# Patient Record
Sex: Male | Born: 1950 | Race: White | Hispanic: No | Marital: Married | Smoking: Never smoker
Health system: Southern US, Community
[De-identification: ages and names within clinical notes are randomized; demographics above are authoritative.]

## PROBLEM LIST (undated history)

## (undated) DIAGNOSIS — C61 Malignant neoplasm of prostate: Secondary | ICD-10-CM

## (undated) DIAGNOSIS — I350 Nonrheumatic aortic (valve) stenosis: Secondary | ICD-10-CM

## (undated) DIAGNOSIS — J189 Pneumonia, unspecified organism: Secondary | ICD-10-CM

## (undated) DIAGNOSIS — J111 Influenza due to unidentified influenza virus with other respiratory manifestations: Secondary | ICD-10-CM

## (undated) DIAGNOSIS — C4490 Unspecified malignant neoplasm of skin, unspecified: Secondary | ICD-10-CM

## (undated) DIAGNOSIS — F419 Anxiety disorder, unspecified: Secondary | ICD-10-CM

## (undated) DIAGNOSIS — J8410 Pulmonary fibrosis, unspecified: Secondary | ICD-10-CM

## (undated) HISTORY — PX: NASAL SEPTUM SURGERY: SHX37

## (undated) HISTORY — PX: HERNIA REPAIR: SHX51

## (undated) HISTORY — DX: Anxiety disorder, unspecified: F41.9

## (undated) HISTORY — PX: SKIN CANCER EXCISION: SHX779

---

## 2016-02-17 ENCOUNTER — Emergency Department (HOSPITAL_BASED_OUTPATIENT_CLINIC_OR_DEPARTMENT_OTHER)
Admission: EM | Admit: 2016-02-17 | Discharge: 2016-02-17 | Disposition: A | Discharge status: Home / Self Care | Payer: BLUE CROSS/BLUE SHIELD | Attending: Emergency Medicine | Admitting: Emergency Medicine

## 2016-02-17 ENCOUNTER — Encounter (HOSPITAL_BASED_OUTPATIENT_CLINIC_OR_DEPARTMENT_OTHER): Payer: Self-pay

## 2016-02-17 ENCOUNTER — Emergency Department (HOSPITAL_BASED_OUTPATIENT_CLINIC_OR_DEPARTMENT_OTHER): Payer: BLUE CROSS/BLUE SHIELD

## 2016-02-17 DIAGNOSIS — Z85828 Personal history of other malignant neoplasm of skin: Secondary | ICD-10-CM | POA: Insufficient documentation

## 2016-02-17 DIAGNOSIS — J189 Pneumonia, unspecified organism: Secondary | ICD-10-CM | POA: Diagnosis not present

## 2016-02-17 DIAGNOSIS — R05 Cough: Secondary | ICD-10-CM | POA: Diagnosis present

## 2016-02-17 HISTORY — DX: Influenza due to unidentified influenza virus with other respiratory manifestations: J11.1

## 2016-02-17 HISTORY — DX: Pneumonia, unspecified organism: J18.9

## 2016-02-17 HISTORY — DX: Unspecified malignant neoplasm of skin, unspecified: C44.90

## 2016-02-17 LAB — BASIC METABOLIC PANEL
Anion gap: 7 (ref 5–15)
BUN: 16 mg/dL (ref 6–20)
CO2: 25 mmol/L (ref 22–32)
Calcium: 8.4 mg/dL — ABNORMAL LOW (ref 8.9–10.3)
Chloride: 103 mmol/L (ref 101–111)
Creatinine, Ser: 1.05 mg/dL (ref 0.61–1.24)
GFR calc Af Amer: >60 mL/min (ref >60)
GFR calc non Af Amer: >60 mL/min (ref >60)
Glucose, Bld: 132 mg/dL — ABNORMAL HIGH (ref 65–99)
Potassium: 4.1 mmol/L (ref 3.5–5.1)
Sodium: 135 mmol/L (ref 135–145)

## 2016-02-17 LAB — URINALYSIS, ROUTINE W REFLEX MICROSCOPIC
Bilirubin Urine: NEGATIVE
Glucose, UA: NEGATIVE mg/dL
Hgb urine dipstick: NEGATIVE
Ketones, ur: NEGATIVE mg/dL
Leukocytes, UA: NEGATIVE
Nitrite: NEGATIVE
Protein, ur: 30 mg/dL — AB
Specific Gravity, Urine: 1.015 (ref 1.005–1.030)
pH: 6 (ref 5.0–8.0)

## 2016-02-17 LAB — CBC WITH DIFFERENTIAL/PLATELET
Basophils Absolute: 0 10*3/uL (ref 0.0–0.1)
Basophils Relative: 0 %
Eosinophils Absolute: 0.4 10*3/uL (ref 0.0–0.7)
Eosinophils Relative: 3 %
HCT: 38.6 % — ABNORMAL LOW (ref 39.0–52.0)
Hemoglobin: 13.1 g/dL (ref 13.0–17.0)
Lymphocytes Relative: 7 %
Lymphs Abs: 1 10*3/uL (ref 0.7–4.0)
MCH: 30.8 pg (ref 26.0–34.0)
MCHC: 33.9 g/dL (ref 30.0–36.0)
MCV: 90.8 fL (ref 78.0–100.0)
Monocytes Absolute: 1.6 10*3/uL — ABNORMAL HIGH (ref 0.1–1.0)
Monocytes Relative: 11 %
Neutro Abs: 11.2 10*3/uL — ABNORMAL HIGH (ref 1.7–7.7)
Neutrophils Relative %: 79 %
Platelets: 215 10*3/uL (ref 150–400)
RBC: 4.25 MIL/uL (ref 4.22–5.81)
RDW: 14.4 % (ref 11.5–15.5)
WBC: 14.2 10*3/uL — ABNORMAL HIGH (ref 4.0–10.5)

## 2016-02-17 LAB — URINALYSIS, MICROSCOPIC (REFLEX): WBC, UA: NONE SEEN WBC/hpf (ref 0–5)

## 2016-02-17 LAB — I-STAT CG4 LACTIC ACID, ED: Lactic Acid, Venous: 0.88 mmol/L (ref 0.5–1.9)

## 2016-02-17 MED ORDER — ACETAMINOPHEN 325 MG PO TABS
650.0000 mg | ORAL_TABLET | Freq: Once | ORAL | Status: AC
  Administered 2016-02-17: 650 mg via ORAL
  Filled 2016-02-17: qty 2

## 2016-02-17 MED ORDER — SODIUM CHLORIDE 0.9 % IV BOLUS (SEPSIS)
1000.0000 mL | Freq: Once | INTRAVENOUS | Status: AC
  Administered 2016-02-17: 1000 mL via INTRAVENOUS

## 2016-02-17 MED ORDER — AZITHROMYCIN 250 MG PO TABS: 250.0000 mg | ORAL_TABLET | Freq: Every day | ORAL | 0 refills | Status: DC

## 2016-02-17 MED ORDER — IBUPROFEN 800 MG PO TABS
800.0000 mg | ORAL_TABLET | Freq: Once | ORAL | Status: AC
  Administered 2016-02-17: 800 mg via ORAL

## 2016-02-17 MED ORDER — AMOXICILLIN 500 MG PO CAPS
500.0000 mg | ORAL_CAPSULE | Freq: Once | ORAL | Status: AC
  Administered 2016-02-17: 500 mg via ORAL
  Filled 2016-02-17: qty 1

## 2016-02-17 MED ORDER — AMOXICILLIN 500 MG PO CAPS: 500.0000 mg | ORAL_CAPSULE | Freq: Three times a day (TID) | ORAL | 0 refills | Status: DC

## 2016-02-17 MED ORDER — SODIUM CHLORIDE 0.9 % IV BOLUS (SEPSIS)
500.0000 mL | Freq: Once | INTRAVENOUS | Status: AC
  Administered 2016-02-17: 500 mL via INTRAVENOUS

## 2016-02-17 MED ORDER — BENZONATATE 100 MG PO CAPS: 100.0000 mg | ORAL_CAPSULE | Freq: Three times a day (TID) | ORAL | 0 refills | Status: DC

## 2016-02-17 MED ORDER — AZITHROMYCIN 250 MG PO TABS
500.0000 mg | ORAL_TABLET | Freq: Once | ORAL | Status: AC
  Administered 2016-02-17: 500 mg via ORAL
  Filled 2016-02-17: qty 2

## 2016-02-17 MED ORDER — IBUPROFEN 800 MG PO TABS
ORAL_TABLET | ORAL | Status: AC
  Filled 2016-02-17: qty 1

## 2016-02-17 NOTE — ED Notes (Signed)
Pt unable to give UA at this time 

## 2016-02-17 NOTE — ED Provider Notes (Signed)
[facility] Provider Note   CSN: [id number] Arrival date & time: 02/17/16  [id number]   By signing my name below, I, Dustin Dean, attest that this documentation has been prepared under the direction and in the presence of Eliezer Mccoy, PA-C Electronically Signed: Soijett Dean, ED Scribe. 02/17/16. 7:06 PM.  History   Chief Complaint Chief Complaint  Patient presents with  . Cough    HPI Eliyas Dean is a 66 y.o. male with a PMHx of flu, pneumonia, who presents to the Emergency Department complaining of intermittent productive cough x reddish-brown sputum onset 5 days ago. Pt reports associated fever of 102.7 and SOB. Pt has tried cetirizine and tylenol with mild relief of his symptoms. Pt notes that he has had pneumonia in the past, but denies being admitted in the past. He denies CP, nausea, vomiting, abdominal pain, urinary symptoms, ear pain, appetite change, and any other symptoms. Denies hx of COPD, HTN, or DM.    The history is provided by the patient. No language interpreter was used.    Past Medical History:  Diagnosis Date  . Flu   . Pneumonia   . Skin cancer     There are no active problems to display for this patient.   Past Surgical History:  Procedure Laterality Date  . HERNIA REPAIR    . SKIN CANCER EXCISION         Home Medications    Prior to Admission medications   Medication Sig Start Date End Date Taking? Authorizing Provider  Cetirizine HCl (ZYRTEC PO) Take by mouth.   Yes Historical Provider, MD  fluticasone (FLONASE) 50 MCG/ACT nasal spray Place into both nostrils daily.   Yes Historical Provider, MD  PAROXETINE HCL PO Take by mouth.   Yes Historical Provider, MD  amoxicillin (AMOXIL) 500 MG capsule Take 1 capsule (500 mg total) by mouth 3 (three) times daily. 02/17/16   Frederica Kuster, PA-C  azithromycin (ZITHROMAX) 250 MG tablet Take 1 tablet (250 mg total) by mouth daily. 02/17/16   Frederica Kuster, PA-C  benzonatate (TESSALON) 100 MG  capsule Take 1 capsule (100 mg total) by mouth every 8 (eight) hours. 02/17/16   Frederica Kuster, PA-C    Family History No family history on file.  Social History Social History  Substance Use Topics  . Smoking status: Never Smoker  . Smokeless tobacco: Never Used  . Alcohol use Yes     Comment: occ     Allergies   Patient has no known allergies.   Review of Systems Review of Systems  Constitutional: Negative for appetite change, chills and fever.  HENT: Negative for ear pain, facial swelling and sore throat.   Respiratory: Positive for cough and shortness of breath.   Cardiovascular: Negative for chest pain.  Gastrointestinal: Negative for abdominal pain, nausea and vomiting.  Genitourinary: Negative for difficulty urinating and dysuria.  Musculoskeletal: Negative for back pain.  Skin: Negative for rash and wound.  Neurological: Negative for headaches.  Psychiatric/Behavioral: The patient is not nervous/anxious.      Physical Exam Updated Vital Signs BP 154/89   Pulse 101   Temp 99.2 F (37.3 C) (Oral)   Resp (!) 29   Ht 5\' 8"  (1.727 m)   Wt 93.9 kg   SpO2 95%   BMI 31.47 kg/m   Physical Exam  Constitutional: He appears well-developed and well-nourished. No distress.  HENT:  Head: Normocephalic and atraumatic.  Mouth/Throat: Oropharynx is clear and moist. No oropharyngeal  exudate.  Eyes: Conjunctivae are normal. Pupils are equal, round, and reactive to light. Right eye exhibits no discharge. Left eye exhibits no discharge. No scleral icterus.  Neck: Normal range of motion. Neck supple. No thyromegaly present.  Cardiovascular: Normal rate, regular rhythm, normal heart sounds and intact distal pulses.  Exam reveals no gallop and no friction rub.   No murmur heard. Pulmonary/Chest: Effort normal. No stridor. No respiratory distress. He has no wheezes. He has rales.  Bilateral rales throughout  Abdominal: Soft. Bowel sounds are normal. He exhibits no distension.  There is no tenderness. There is no rebound and no guarding.  Musculoskeletal: He exhibits no edema.  Lymphadenopathy:    He has no cervical adenopathy.  Neurological: He is alert. Coordination normal.  Skin: Skin is warm and dry. No rash noted. He is not diaphoretic. No pallor.  Psychiatric: He has a normal mood and affect.  Nursing note and vitals reviewed.    ED Treatments / Results  DIAGNOSTIC STUDIES: Oxygen Saturation is 96% on RA, nl by my interpretation.    COORDINATION OF CARE: 7:05 PM Discussed treatment plan with pt at bedside which includes CXR, labs, and pt agreed to plan.   Labs (all labs ordered are listed, but only abnormal results are displayed) Labs Reviewed  CBC WITH DIFFERENTIAL/PLATELET - Abnormal; Notable for the following:       Result Value   WBC 14.2 (*)    HCT 38.6 (*)    Neutro Abs 11.2 (*)    Monocytes Absolute 1.6 (*)    All other components within normal limits  BASIC METABOLIC PANEL - Abnormal; Notable for the following:    Glucose, Bld 132 (*)    Calcium 8.4 (*)    All other components within normal limits  URINALYSIS, ROUTINE W REFLEX MICROSCOPIC - Abnormal; Notable for the following:    Protein, ur 30 (*)    All other components within normal limits  URINALYSIS, MICROSCOPIC (REFLEX) - Abnormal; Notable for the following:    Bacteria, UA RARE (*)    Squamous Epithelial / LPF 0-5 (*)    All other components within normal limits  I-STAT CG4 LACTIC ACID, ED    Radiology Dg Chest 2 View  Result Date: 02/17/2016 CLINICAL DATA:  Acute onset of productive cough, generalized chest pain and fever. Initial encounter. EXAM: CHEST  2 VIEW COMPARISON:  None. FINDINGS: The lungs are well-aerated. Patchy bilateral airspace opacities raise concern for pneumonia. Underlying peribronchial thickening is noted. There is no evidence of pleural effusion or pneumothorax. The heart is borderline normal in size. No acute osseous abnormalities are seen. IMPRESSION:  Patchy bilateral airspace opacities raise concern for pneumonia. Underlying peribronchial thickening noted. Would perform follow-up chest radiograph 3-4 weeks after completion of treatment for pneumonia, to exclude underlying interstitial lung disease, given the appearance of airspace opacities. Electronically Signed   By: Garald Balding M.D.   On: 02/17/2016 18:55    Procedures Procedures (including critical care time)  Medications Ordered in ED Medications  acetaminophen (TYLENOL) tablet 650 mg (650 mg Oral Given 02/17/16 1844)  sodium chloride 0.9 % bolus 1,000 mL (0 mLs Intravenous Stopped 02/17/16 2040)  ibuprofen (ADVIL,MOTRIN) tablet 800 mg (800 mg Oral Given 02/17/16 1916)  azithromycin (ZITHROMAX) tablet 500 mg (500 mg Oral Given 02/17/16 2039)  amoxicillin (AMOXIL) capsule 500 mg (500 mg Oral Given 02/17/16 2040)  sodium chloride 0.9 % bolus 500 mL (0 mLs Intravenous Stopped 02/17/16 2131)     Initial Impression /  Assessment and Plan / ED Course  I have reviewed the triage vital signs and the nursing notes.  Pertinent labs & imaging results that were available during my care of the patient were reviewed by me and considered in my medical decision making (see chart for details).     Patient with. CBC shows the CBC 14.2. BMP shows glucose 132, calcium 8.4. Lactate 0.88. UA shows 30 protein, rare bacteria. Patient initially tachycardic which was improved following the reduction with Motrin and Tylenol and 1500 mL fluid bolus. Patient given first dose of azithromycin and amoxicillin in the ED. CURB-65 negative patient is well-appearing. Discharge home with amoxicillin, and azithromycin, Tessalon. Recheck a PCP in 3 days. Strict return precautions discussed. Patient discharged in satisfactory condition. I discussed patient case with Dr. Tamera Punt who guided the patient's management and agrees with plan.  Final Clinical Impressions(s) / ED Diagnoses   Final diagnoses:  Community acquired pneumonia,  unspecified laterality    New Prescriptions Discharge Medication List as of 02/17/2016  9:35 PM    START taking these medications   Details  benzonatate (TESSALON) 100 MG capsule Take 1 capsule (100 mg total) by mouth every 8 (eight) hours., Starting Fri 02/17/2016, Print       I personally performed the services described in this documentation, which was scribed in my presence. The recorded information has been reviewed and is accurate.     Frederica Kuster, PA-C 02/17/16 [place], MD 02/17/16 2212

## 2016-02-17 NOTE — Discharge Instructions (Signed)
Medications: Azithromycin, amoxicillin, Tessalon  Treatment: Take azithromycin and amoxicillin as prescribed. Make sure to finish all this medication. Take Tessalon every 8 hours as needed for cough. You can also take the medicine you have at home instead.  Follow-up: Please follow-up with your primary care provider on Monday for recheck and follow-up of today's visit. Please return to emergency department if he develop any new or worsening symptoms including shortness of breath, lightheadedness, fever over 100.4 that is not resolved with Tylenol or Motrin, or any other new or concerning symptoms.

## 2016-02-17 NOTE — ED Triage Notes (Signed)
C/o flu s/s x 5 days-NAD-steady gait

## 2016-02-17 NOTE — ED Notes (Signed)
Patient transported to X-ray 

## 2021-01-24 ENCOUNTER — Other Ambulatory Visit: Payer: Self-pay

## 2021-01-24 ENCOUNTER — Ambulatory Visit (INDEPENDENT_AMBULATORY_CARE_PROVIDER_SITE_OTHER): Payer: Medicare Other | Admitting: Internal Medicine

## 2021-01-24 ENCOUNTER — Encounter: Payer: Self-pay | Admitting: Internal Medicine

## 2021-01-24 VITALS — BP 128/80 | HR 84 | Temp 98.4°F | Ht 67.0 in | Wt 216.2 lb

## 2021-01-24 DIAGNOSIS — R053 Chronic cough: Secondary | ICD-10-CM | POA: Diagnosis not present

## 2021-01-24 DIAGNOSIS — J9611 Chronic respiratory failure with hypoxia: Secondary | ICD-10-CM

## 2021-01-24 DIAGNOSIS — J849 Interstitial pulmonary disease, unspecified: Secondary | ICD-10-CM | POA: Diagnosis not present

## 2021-01-24 DIAGNOSIS — R911 Solitary pulmonary nodule: Secondary | ICD-10-CM

## 2021-01-24 DIAGNOSIS — I350 Nonrheumatic aortic (valve) stenosis: Secondary | ICD-10-CM

## 2021-01-24 DIAGNOSIS — E669 Obesity, unspecified: Secondary | ICD-10-CM

## 2021-01-24 NOTE — Patient Instructions (Addendum)
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 - Based on report and history it is progressive -Specific variety not known but differential diagnosis is idiopathic pulmonary fibrosis [IPF] versus chronic hypersensitive pneumonitis versus other patterns  Plan -Glad you are already in contact with Marlane Mingle and the support group - Serum: ESR, ACE, ANA, DS-DNA, RF, anti-CCP,   Total CK,  Aldolase,   scl-70, ssA, ssB, anti-RNP, anti-JO-1, & Hypersensitivity Pneumonitis Panel -Check QuantiFERON gold -Do full pulmonary function test -Do high-resolution CT chest supine and prone [3rd-4th week January 2023 -based on history of right lower lobe nodule] -We will discuss available CT scan data from the outside hospital with our radiologist -> potentially commit to antifibrotic therapy (pirfenidone versus nintedanib] even before the next visit - get rid of feather pillow - will advise to stop gardening once HP suggested onCT  Right lower lobe nodule 9 mm -seen on CT October 2022  Plan  - capture information on above CT chest  Chronic cough -due to  interstitial lung disease   Plan - To be addressed in the future including consideration for cough research protocol -For now gabapentin  Nonrheumatic aortic valve stenosis   Plan  - We will follow-up on the echo that you are doing with your cardiologist tomorrow  Obesity - BMI 33.8  Plan  - Ultimately significant weight loss is required  Followup   - Next few to several weeks but after completing all the tests to discuss commencing antifibrotic therapy and discussing test results -At 30-minute visit Dr. Chase Caller  Addendum 01/26/2021 -radiology Dr. Weber Cooks wrote back saying CT scan is classic for chronic HP.  We will call the patient and get the patient started on nintedanib counseling which is first-line for progressive non-- IPF  ILD.  If he is reluctant then we will do pirfenidone.  We will set up pharmacy counseling.  If he is reluctant for nintedanib based on risk versus benefit we will do pirfenidone

## 2021-01-24 NOTE — Progress Notes (Addendum)
Cardiology Dr. Loni Beckwith 02/15/20 Dustin Dean is 71 y.o. year old male with a history of No prior cardiovascular disease who presents to [place] cardiology for the evaluation of a heart murmur. This patient is a non-smoker and denies a previous history of myocardial infarction, congestive heart failure, or valvular heart disease. 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 He really has little in the way of exertional chest symptoms. Occasionally according to the wife if he walks up an incline briskly he may have some minor shortness of breath but remains active and has little in the way limitation. He denies a previous history of myocardial infarction, congestive heart failure, or known valvular heart disease. At a recent examination he was found to have a heart murmur and an echocardiogram was performed. It demonstrated a peak gradient across his aortic valve of 30 mmHg and an aortic valve area of 1.1 cm. This was read as being consistent with moderate aortic stenosis. I had a fairly significant discussion with the patient and his wife about the implications of his aortic valve and the need to follow the aortic stenosis progression over time.  11/14/2020 office visit with Dustin Dean pulmonary physician assistant at [facility] has past medical history of restrictive lung disease, interstitial lung disease, allergic rhinitis, laryngeal pharyngeal reflux, arthritis, lung nodules and persistent cough. He is a non-smoker and has never smoked. He has medication allergy to pseudoephedrine.  [name] presents today with complaint of a progressively worsening shortness of breath and dyspnea on exertion over the past several months. We have been following his interstitial lung disease since 2018. He has been stable without complaint of dyspnea on exertion over the past several years. He indicates that recently he has had more shortness of breath  with exertion. He and his wife will walk the dog in their usual route through the neighborhood that they have done for many years. He reports that he will get short of breath along the root in a way that he has never been bothered with previously. He has recently requested an albuterol inhaler and reports that it has been helpful. He has been using Symbicort 160/4.5 twice daily but has been having difficulty using it successfully as he has been coughing significantly and loses a lot of the medication. A recent chest CT conducted 10/24/2020 notes a progression in the interstitial changes. A 9 mm right lower lobe nodule is also noted. The plan is to have a 61-monthfollow-up chest CT conducted 02/04/2021. A 6-minute walk test conducted today shows oxygen desaturation and need for supplemental oxygen. I will place the order for supplemental oxygen. [facility]   Patient at rest on RA with baseline sats : 97% After exertion for 3 min on RA pt's sats were : 86% Patient placed on oxygen via nasal cannula at 2 lpm flow via portable oxygen concentrator device Exertion for 6 mins with oxygen confirmed final sats at 94%   #1 lung nodules Chest CT 10/24/2020 shows 9 mm right lower lobe lung nodule Follow-up chest CT scheduled for 324-montheevaluation in January 2023  2. Interstitial lung disease Chest CT 11/04/2020 notes fibrotic interstitial lung disease with evidence of progression compared to 08/28/2020 [facility]   #Primary Care dec 2022 Osteopenia  Due for DXA. Prostate cancer Saw Dr. Louis Meckel at [facility]. Biopsy + for cancer. They are just watching it. He is going to have MRI and another biopsy next visit.   OV 01/24/2021 -evaluation and transfer of care to Dr. Chase Caller in ILD center in [place].  Referred by Marlane Mingle  patient support group leader for the pulmonary fibrosis foundation support group  Subjective:  Patient ID: Dustin Dean, male , DOB: [birth year] , age 84 y.o. , MRN: [id number] , ADDRESS: [place] [id number] PCP Joneen Boers, MD Patient Care Team: Joneen Boers, MD as PCP - General (Family Medicine)  This Provider for this visit: Treatment Team:  Attending Provider: Brand Males, MD    01/24/2021 -   Chief Complaint  Patient presents with   Consult    Pt is here for a switch of providers to take over care for his pulmonary fibrosis. 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 Pt does become SOB with activities and also states that he does have a chronic cough.     HPI  Dustin Dean 71 y.o. -new evaluation for interstitial lung disease.  History is provided by review of the chart and also talking to the patient and his wife.  He tells me that he has had chronic cough since his teenage years.  But for the last few to several years its been more consistent.  He was seeing the physician assistant at [facility].  Somewhere along the way he got referred to Dr. Dennison Mascot right at [facility] some 3 years ago.  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  After that cough is largely improved except for occasional exacerbations in the fall in the winter.  Then in 2018 sometime after he went on gabapentin he was informed by the physician assistant that he might have early ILD.  He does not recollect any serology or biopsy being done.  He does recollect a few pulmonary function test.  Then all of a sudden starting September 2022 his cough "got out of hand" and also developed worsening shortness of breath.  This the first time he started developing worsening shortness of breath.  Then by November 2022 started needing exertional oxygen leading to current symptoms.  He was referred to [facility] but he preferred to establish with the ILD center  here in [place] with [place] based on [facility] recommendation.  He found Marlane Mingle through Internet search for pulmonary fibrosis foundation.  His current symptom score is below.  He is not on any antifibrotic.  [place] [facility] Comprehensive ILD Questionnaire  Symptoms:  SYMPTOM SCALE - ILD 01/24/2021  Current weight   O2 use 2L [place] with ex  Shortness of Breath 0 -> 5 scale with 5 being worst (score 6 If unable to do)  At rest 0  Simple tasks - showers, clothes change, eating, shaving 1  Household (dishes, doing bed, laundry) 1  Shopping 2  Walking level at own pace 5  Walking up Stairs 5  Total (30-36) Dyspnea Score 14  How bad is your cough? 2  How bad is your fatigue 1  How bad is nausea 00000  How bad is vomiting?  0  How bad is diarrhea? 0  How bad is anxiety? 0  How bad is depression 0  Any chronic pain - if so where and how bad 0       Past Medical History :  -He  has obesity BMI 33-34 - Denies any collagen vascular disease - He is known to have aortic stenosis -sees cardiologist at [facility].  December 2021 echocardiogram EF 55 to 60% with tricuspid aortic valve and moderate stenosis normal right ventricle -Denies any asthma or COPD.  Denies any heart failure denies any collagen vascular disease -Denies snoring or excessive daytime somnolence.  Denies stroke.  Denies formal diagnosis of sleep apnea denies formal diagnosis of pulmonary hypertension [echo a year ago did not show pulmonary hypertension] -Has had vaccination against COVID but has not had COVID disease -Recent diagnosis of late 2022 early stage prostate cancer at [facility] on observation treatment -Has history of skin cancer for which he apply sunscreen regularly  ROS:  -Shortness of breath present Cough chronic plus -Has intermittent chronic diarrhea and takes Imodium but no other GI side effects -Possible Raynaud's for the last several decades  FAMILY HISTORY of LUNG DISEASE:  Denies  family for pulmonary fibrosis.  Denies family Struve COPD or asthma sarcoid or cystic fibrosis.  Denies family history of hypersensitive pneumonitis.  Denies autoimmune disease.  Denies any premature graying of the hair or albinism  PERSONAL EXPOSURE HISTORY:  No prior cigarette smoking -No marijuana use of cocaine use no intravenous drug use.  HOME  EXPOSURE and HOBBY DETAILS :  -Single-family home in a one-story space.  [place] setting age of the current home is 31 years.  He is lived there for 6 years.  He does use a feather pillow and this occasionally mold or mildew in the bathroom.  All his life he is done active gardening with mulch and woodchips and occasionally damp soil.  He is worked out in the yard quite a bit and does gardening.  He enjoys the gardening.  OCCUPATIONAL HISTORY (122 questions) : -He worked in the [employer] at [employer] and then [employer] no [employer].  He does not remember if the buildings had mold in it.  He does not think so -Detail greater than 100 question organic and inorganic antigen history exposure is negative  PULMONARY TOXICITY HISTORY (27 items):  Denies  INVESTIGATIONS: Report of pulmonary function test but I am not able to get it in Care Everywhere     Simple office walk 185 feet x  3 laps goal with forehead probe 01/24/2021    O2 used Ra at rest, desat at 1 lap and then walked with 2L piulse   Number laps completed 1 lap on RA, then 3 laps on 2L   Comments about pace x   Resting Pulse Ox/HR 96% and 88/min at 1 lap   Final Pulse Ox/HR 92% and 115/min on 3 laps on 2L pulsed   Desaturated </= 88% no   Desaturated <= 3% points yes   Got Tachycardic >/= 90/min yes   Symptoms at end of test Modreate dyspnea   Miscellaneous comments Corrected with 2L [place]      High-resolution CT scan of the chest August 2020  -There is a report of honeycombing but without any craniocaudal gradient.  Air-trapping reported  alternative pattern more suggestive of chronic hypersensitive pneumonitis.  But there is already progression from August 2019 -Read by Dr. Vinnie Langton   CT Chest data- HRCT 11/04/20 unable for my visualization.   CLINICAL DATA:  Worsening shortness of breath   EXAM:  CT CHEST WITHOUT CONTRAST   TECHNIQUE:  Multidetector CT imaging of the chest was performed following the  standard protocol without  intravenous contrast. High resolution  imaging of the lungs, as well as inspiratory and expiratory imaging,  was performed.   COMPARISON:  Chest CT dated August 29, 2018   FINDINGS:  Cardiovascular: Normal heart size. No pericardial effusion. Coronary  artery calcifications of the circumflex. Aortic valve  calcifications. Minimal calcified plaque of the thoracic aorta.   Mediastinum/Nodes: Mildly enlarged mediastinal lymph nodes,  unchanged compared to prior exam and likely reactive. Esophagus and  thyroid are unremarkable.   Lungs/Pleura: Central airways are patent. Bilateral air trapping.  Peribronchovascular and subpleural reticular opacities with traction  bronchiectasis and no clear craniocaudal predominance. Honeycomb  change primarily seen in the anterior upper lobes is increased when  compared to prior exam. New linear nodular opacity of the right  lower lobe measuring up to 9 mm on series 4, image 210.   Upper Abdomen: No acute abnormality.   Musculoskeletal: No chest wall mass or suspicious bone lesions  identified.   IMPRESSION:  Fibrotic interstitial lung disease with evidence of progression when  compared with August 29, 2018 prior exam. Favor fibrotic  hypersensitivity pneumonitis given presence of air trapping.  Findings are suggestive of an alternative diagnosis (not UIP) per  consensus guidelines: Diagnosis of Idiopathic Pulmonary Fibrosis: An  Official ATS/ERS/JRS/ALAT Clinical Practice Guideline. [place], Iss 5, [phone], Sep 15 2016.   New linear nodular opacity of the right lower lobe measuring up to 9  mm, likely an area of focal fibrosis. Recommend follow-up chest CT  in 3 months to ensure stability.   Aortic Atherosclerosis (ICD10-I70.0).    Electronically Signed    By: Yetta Glassman M.D.    On: 11/04/2020 14:17  No results found.    PFT  No flowsheet data found.     has a past medical history of Flu, Pneumonia, and Skin cancer.   reports that he has never smoked. He has never used smokeless tobacco.  Past Surgical History:  Procedure Laterality Date   HERNIA REPAIR     SKIN CANCER EXCISION      Allergies  Allergen Reactions   Bee Venom Rash   Shellfish Allergy Hives   Pseudoephedrine Anxiety    Immunization History  Administered Date(s) Administered   Fluad Quad(high Dose 65+) 10/23/2017, 09/30/2019, 10/18/2020   Influenza-Unspecified 11/11/2009, 11/08/2011, 11/03/2013, 11/22/2014, 11/10/2015   Moderna Covid-19 Vaccine Bivalent Booster 26yr & up 10/24/2020   Moderna Sars-Covid-2 Vaccination 03/19/2019, 04/16/2019, 11/16/2019, 05/07/2020   Pneumococcal Conjugate-13 12/08/2007, 12/21/2015, 09/04/2018   Pneumococcal Polysaccharide-23 12/08/2007, 06/21/2017, 06/16/2018   Tdap 12/08/2007, 11/27/2018   Zoster Recombinat (Shingrix) 10/30/2013, 09/04/2018, 11/27/2018   Zoster, Live 10/30/2013   Zoster, Unspecified 10/30/2013    No family history on file.   Current Outpatient Medications:    acetaminophen (TYLENOL) 325 MG tablet, Take by mouth., Disp: , Rfl:    albuterol (VENTOLIN HFA) 108 (90 Base) MCG/ACT inhaler, Inhale into the lungs., Disp: , Rfl:    aspirin-acetaminophen-caffeine (EXCEDRIN MIGRAINE) 250-250-65 MG tablet, Take by mouth., Disp: , Rfl:    budesonide-formoterol (SYMBICORT) 160-4.5 MCG/ACT inhaler, Inhale into the lungs., Disp: , Rfl:    Calcium Carb-Cholecalciferol (CALCIUM/VITAMIN D PO), Take 1 tablet by mouth daily., Disp: , Rfl:    Cetirizine HCl (ZYRTEC  PO), Take by mouth., Disp: , Rfl:    fluorouracil (EFUDEX) 5 % cream, Apply topically., Disp: , Rfl:    fluticasone (FLONASE) 50 MCG/ACT nasal spray, Place into both nostrils daily., Disp: , Rfl:  fluticasone (FLONASE) 50 MCG/ACT nasal spray, one spray by Both Nostrils route every evening., Disp: , Rfl:    gabapentin (NEURONTIN) 300 MG capsule, Take by mouth., Disp: , Rfl:    ibuprofen (ADVIL) 200 MG tablet, Take by mouth., Disp: , Rfl:    loperamide (IMODIUM) 2 MG capsule, Take by mouth., Disp: , Rfl:    meclizine (ANTIVERT) 25 MG tablet, , Disp: , Rfl:    Multiple Vitamin (MULTIVITAMIN) capsule, Take 1 capsule by mouth every morning., Disp: , Rfl:    naproxen sodium (ALEVE) 220 MG tablet, Take by mouth., Disp: , Rfl:    omeprazole (PRILOSEC) 40 MG capsule, Take 40 mg by mouth 2 (two) times daily., Disp: , Rfl:    PAROXETINE HCL PO, Take by mouth., Disp: , Rfl:    rosuvastatin (CRESTOR) 5 MG tablet, Take 1 tablet by mouth daily., Disp: , Rfl:    SUMAtriptan (IMITREX) 100 MG tablet, TAKE ONE TABLET BY MOUTH AT ONSET OF HEADACHE; MAY REPEAT ONE TABLET IN 2 HOURS IF NEEDED. MAX OF 200 MG PER DAY, Disp: , Rfl:       Objective:   Vitals:   01/24/21 1552  BP: 128/80  Pulse: 84  Temp: 98.4 F (36.9 C)  TempSrc: Oral  SpO2: 96%  Weight: 216 lb 3.2 oz (98.1 kg)  Height: _0  (1.702 m)    Estimated body mass index is 33.86 kg/m as calculated from the following:   Height as of this encounter: _1  (1.702 m).   Weight as of this encounter: 216 lb 3.2 oz (98.1 kg).  _2 @  [employer]   01/24/21 1552  Weight: 216 lb 3.2 oz (98.1 kg)     Physical Exam  General Appearance:    Alert, cooperative, no distress, appears stated age - yes , Deconditioned looking - n , OBESE  - yes, Sitting on Wheelchair -  no  Head:    Normocephalic, without obvious abnormality, atraumatic  Eyes:    PERRL, conjunctiva/corneas clear,  Ears:    Normal TM's and external ear canals, both ears   Nose:   Nares normal, septum midline, mucosa normal, no drainage    or sinus tenderness. OXYGEN ON  - yes . Patient is @ 2L   Throat:   Lips, mucosa, and tongue normal; teeth and gums normal. Cyanosis on lips - no  Neck:   Supple, symmetrical, trachea midline, no adenopathy;    thyroid:  no enlargement/tenderness/nodules; no carotid   bruit or JVD  Back:     Symmetric, no curvature, ROM normal, no CVA tenderness  Lungs:     Distress - no , Wheeze no, Barrell Chest - no, Purse lip breathing - no, Crackles - YES Bilateral UL And bilaterl LL. Grater in UL   Chest Wall:    No tenderness or deformity.    Heart:    Regular rate and rhythm, S1 and S2 normal, no rub   or gallop, Murmur - no  Breast Exam:    NOT DONE  Abdomen:     Soft, non-tender, bowel sounds active all four quadrants,    no masses, no organomegaly. Visceral obesity - YES  Genitalia:   NOT DONE  Rectal:   NOT DONE  Extremities:   Extremities - normal, Has Cane - no, Clubbing - no, Edema - no  Pulses:   2+ and symmetric all extremities  Skin:   Stigmata of Connective Tissue Disease - no  Lymph nodes:   Cervical, supraclavicular, and  axillary nodes normal  Psychiatric:  Neurologic:   Pleasant - yes, Anxious - no, Flat affect - no  CAm-ICU - neg, Alert and Oriented x 3 - yes, Moves all 4s - yes, Speech - normal, Cognition - intact       Assessment:       ICD-10-CM   1. ILD (interstitial lung disease) (HCC)  J84.9 QuantiFERON-TB Gold Plus    Pulmonary function test    CT Chest High Resolution    Sedimentation rate    Angiotensin converting enzyme    ANA    Anti-DNA antibody, double-stranded    Rheumatoid factor    Cyclic citrul peptide antibody, IgG    CK total and CKMB (cardiac)not at [facility]    Aldolase    Anti-scleroderma antibody    Sjogren's syndrome antibods(ssa + ssb)    RNP Antibodies    Anti-Jo 1 antibody, IgG    Hypersensitivity Pneumonitis    2. Chronic respiratory failure with hypoxia (HCC)  J96.11      3. Nodule of lower lobe of right lung  R91.1 CT Chest High Resolution    4. Chronic cough  R05.3     5. Nonrheumatic aortic valve stenosis  I35.0     6. Obesity (BMI 30.0-34.84)  E66.9      Being age greater than 75 and Caucasian and male gender with non-smoking history the history is pretty good for pretest probably that he has IPF as the most likely cause of his ILD but radiologist have consistently said that he has features of hypersensitive pneumonitis.  If so then the very likely cause this is gardening and using the feather pillow.  I personally am not able to visualize the image.  I will reach out to our radiology colleagues.  If the needle point towards chronic HP then is definitely progressive [progressive phenotype non-- IPF ILD].  He will benefit from nintedanib because it is better studied even though he has diarrhea.  He has no other contraindications for nintedanib.  If it looks like IPF then he can make a choice between pirfenidone and nintedanib he has left this decision to me.  We will also discuss in a case conference at some point.  We will also get serology and rule out autoimmune cause of interstitial lung disease.  I will ask him to stop gardening.  Have asked him to get rid of his feather pillow  He has a nodule on his chest he needs a 34-month CT scan which we can do in January 2023 because his last CT scan was in October  He has chronic cough and this will have to be addressed later  He now has chronic hypoxemic respiratory failure.  Will be interesting to see what the echo tomorrow shows.  At some point he might need a right heart catheterization for ruling out WHO group 3 pulmonary hypertension  He will stay engaged with the support group  The long run he will definitely need to lose weight and get physically fit. -Age wise he still is a lung transplant potential candidate    Plan:     Patient Instructions  ILD (interstitial lung disease) ([place]) Chronic respiratory  failure with hypoxia ([place])  -You have pulmonary fibrosis that is called interstitial lung disease - Based on report and history it is progressive -Specific variety not known but differential diagnosis is idiopathic pulmonary fibrosis [IPF] versus chronic hypersensitive pneumonitis versus other patterns  Plan -Glad you are already in contact with Josph Macho  Haley and the support group - Serum: ESR, ACE, ANA, DS-DNA, RF, anti-CCP,   Total CK,  Aldolase,   scl-70, ssA, ssB, anti-RNP, anti-JO-1, & Hypersensitivity Pneumonitis Panel -Check QuantiFERON gold -Do full pulmonary function test -Do high-resolution CT chest supine and prone [3rd-4th week January 2023 -based on history of right lower lobe nodule] -We will discuss available CT scan data from the outside hospital with our radiologist -> potentially commit to antifibrotic therapy (pirfenidone versus nintedanib] even before the next visit - get rid of feather pillow - will advise to stop gardening once HP suggested onCT  Right lower lobe nodule 9 mm -seen on CT October 2022  Plan  - capture information on above CT chest  Chronic cough -due to  interstitial lung disease   Plan - To be addressed in the future including consideration for cough research protocol -For now gabapentin  Nonrheumatic aortic valve stenosis   Plan  - We will follow-up on the echo that you are doing with your cardiologist tomorrow  Obesity - BMI 33.8  Plan  - Ultimately significant weight loss is required  Followup   - Next few to several weeks but after completing all the tests to discuss commencing antifibrotic therapy and discussing test results -At 30-minute visit Dr. Chase Caller   Addendum 01/26/2021 -radiology Dr. Weber Cooks wrote back saying CT scan is classic for chronic HP.  We will call the patient and get the patient started on nintedanib which is first-line for progressive non-- IPF ILD. If he is reluctant then we will do pirfenidone.  We will set up  pharmacy counseling.  If he is reluctant for nintedanib based on risk versus benefit we will do pirfenidone   ( Level 05 visit: New 60-74 min   in  visit type: on-site physical face to visit  in total care time and counseling or/and coordination of care by this undersigned MD - Dr Brand Males. This includes one or more of the following on this same day 01/24/2021: pre-charting, chart review, note writing, documentation discussion of test results, diagnostic or treatment recommendations, prognosis, risks and benefits of management options, instructions, education, compliance or risk-factor reduction. It excludes time spent by the [place] or office staff in the care of the patient. Actual time 61 min)  SIGNATURE    Dr. Brand Males, M.D., F.C.C.P,  Pulmonary and Critical Care Medicine Staff Physician, [place] Director - Interstitial Lung Disease  Program  Pulmonary [place] at [place], [place], [id number]  Pager: [phone], If no answer or between  15:00h - 7:00h: call [phone] Telephone: [phone]  5:15 PM 01/24/2021 Jilda Roche

## 2021-01-26 ENCOUNTER — Telehealth: Payer: Self-pay | Admitting: Internal Medicine

## 2021-01-26 NOTE — Telephone Encounter (Signed)
[name]  I saw the patient 2 days ago.  At that time differential diagnosis was chronic HP versus IPF.  Radiologist has gotten back to me seeing the CT scan features classic for chronic HP although he does not have any exposures.  Based on this nintedanib would be first-line.  He has progressive phenotype anyway.  He does have some cardiac issues.  He is willing to go with what we recommend.  He is going to get a cardiac echo with his cardiologist this week at [place].  I am going to see him 02/23/2021 as follow-up  Plan - Do mind doing pirfenidone versus nintedanib counseling.  Also the insurance issue if I call this chronic HP is to be taken into account.  I can prescribe either after your visit or after my visit.  But I would like for you to see him before my visit on 02/23/2021.  You can call him and let him know that I also spoke to radiology and the above content

## 2021-01-27 ENCOUNTER — Other Ambulatory Visit: Payer: Medicare Other

## 2021-01-27 DIAGNOSIS — J849 Interstitial pulmonary disease, unspecified: Secondary | ICD-10-CM

## 2021-01-27 NOTE — Addendum Note (Signed)
Addended by: Suzzanne Cloud E on: 01/27/2021 03:53 PM   Modules accepted: Orders

## 2021-01-28 ENCOUNTER — Telehealth: Payer: Self-pay | Admitting: Internal Medicine

## 2021-01-28 ENCOUNTER — Telehealth (HOSPITAL_BASED_OUTPATIENT_CLINIC_OR_DEPARTMENT_OTHER): Payer: Self-pay

## 2021-01-28 DIAGNOSIS — J849 Interstitial pulmonary disease, unspecified: Secondary | ICD-10-CM

## 2021-01-28 DIAGNOSIS — R768 Other specified abnormal immunological findings in serum: Secondary | ICD-10-CM

## 2021-01-28 LAB — ANTI-JO 1 ANTIBODY, IGG: Anti JO-1: <0.2 AI (ref 0.0–0.9)

## 2021-01-28 LAB — RNP ANTIBODIES: ENA RNP Ab: 0.4 AI (ref 0.0–0.9)

## 2021-01-28 NOTE — Telephone Encounter (Signed)
Dustin Dean  LEt [name] know ahead of the visit followup with me on 02/23/21   A) making referral to [facility] to discuss ofev which would be first line baed on what radiologist told me about CT and fact rheumaotid facot is positive  B) rheumatoid factor strongly positive - refer rheumatology Dr Deveshwar/Rice  C) I reviewed echo report (below) Will discuss at followup  Aortic Valve: There is moderate stenosis, with peak and mean gradients  of 44.000 and 27.000 mmHg.  Aortic valve area calculates to approximately  1.4 cm    Mitral Valve: There is mild regurgitation.    Tricuspid Valve: There is mild regurgitation.    Tricuspid Valve: The right ventricular systolic pressure is normal (<36  mmHg).   1.  Adequate 2D M-mode and color flow Doppler echocardiogram demonstrates  normal left ventricular chamber size and contractility.  There may be mild  left ventricular hypertrophy.  Ejection fraction is estimated 65%  2.  The aortic valve is thickened and demonstrates reduced leaflet  mobility.  There is mild to moderate aortic stenosis with aortic valve  area calculating to 1.4 cm  3.  Mild mitral annular calcification is noted but there is good leaflet  mobility.  Mild mitral insufficiency is noted  4.  Tricuspid valve appears to be normal  5.  The atria are grossly normal size bilaterally  6.  The right ventricular chamber size and function is normal  6.  The pericardium is of normal thickness there is no effusion

## 2021-01-30 ENCOUNTER — Encounter: Payer: Self-pay | Admitting: Internal Medicine

## 2021-01-30 LAB — ALDOLASE: Aldolase: 4.1 U/L (ref <=8.1)

## 2021-01-30 NOTE — Telephone Encounter (Signed)
Called and spoke with pt letting him know the results of recent labwork and also echo and stated to him that MR would further discuss echo results at upcoming appt. Pt verbalized understanding. Let him know that we were placing referral to rheumatology based off of labwork.  Also stated to him the info per MR that he was referring pt to [facility], pharmacist about OFEV counseling and stated to him that she would be in touch. Pt verbalized understanding.  Nothing further needed.

## 2021-02-01 LAB — HYPERSENSITIVITY PNEUMONITIS
A. Pullulans Abs: NEGATIVE
A.Fumigatus #1 Abs: NEGATIVE
Micropolyspora faeni, IgG: NEGATIVE
Pigeon Serum Abs: NEGATIVE
Thermoact. Saccharii: NEGATIVE
Thermoactinomyces vulgaris, IgG: NEGATIVE

## 2021-02-01 NOTE — Telephone Encounter (Signed)
Scheduled phone visit with patient on 02/07/20 to review antifibrotics in detail  Knox Saliva, PharmD, MPH, BCPS Clinical Pharmacist (Rheumatology and Pulmonology)

## 2021-02-01 NOTE — Telephone Encounter (Signed)
MR, please see mychart message sent by pt about the Symbicort inhaler and please advise if you think pt should stay on it or if it would be okay for him to come off of it.

## 2021-02-02 NOTE — Telephone Encounter (Signed)
Normally I would say.  Otherwise. continue the Symbicort while going through work-up but given the fact that this is expensive and his ILDI am okay stopping it.If shortness of breath gets worse we can try to give him inhaler sample of Trelegy or [employer]

## 2021-02-02 NOTE — Telephone Encounter (Signed)
Ok to stop symbicort given fact it is expensive and we can see how things pan out

## 2021-02-02 NOTE — Telephone Encounter (Signed)
Called and spoke with patient and told him it was ok to stop symbicort per orders ad will check on him at his follow up on Monday 02/06/21. Nothing further needed at this time

## 2021-02-03 LAB — ANTI-DNA ANTIBODY, DOUBLE-STRANDED: ds DNA Ab: 1 IU/mL

## 2021-02-03 LAB — CK TOTAL AND CKMB (NOT AT ARMC): Total CK: 107 U/L (ref 44–196)

## 2021-02-03 LAB — QUANTIFERON-TB GOLD PLUS
Mitogen-NIL: 9.25 IU/mL
NIL: 0.06 IU/mL
QuantiFERON-TB Gold Plus: NEGATIVE
TB1-NIL: 0.04 IU/mL
TB2-NIL: 0.03 IU/mL

## 2021-02-03 LAB — SEDIMENTATION RATE: Sed Rate: 31 mm/h — ABNORMAL HIGH (ref 0–20)

## 2021-02-03 LAB — RHEUMATOID FACTOR: Rheumatoid fact SerPl-aCnc: 323 IU/mL — ABNORMAL HIGH (ref <14)

## 2021-02-03 LAB — CYCLIC CITRUL PEPTIDE ANTIBODY, IGG: Cyclic Citrullin Peptide Ab: <16 UNITS

## 2021-02-03 LAB — SJOGREN'S SYNDROME ANTIBODS(SSA + SSB)
SSA (Ro) (ENA) Antibody, IgG: <1 AI
SSB (La) (ENA) Antibody, IgG: <1 AI

## 2021-02-03 LAB — ANA: Anti Nuclear Antibody (ANA): NEGATIVE

## 2021-02-03 LAB — ANGIOTENSIN CONVERTING ENZYME: Angiotensin-Converting Enzyme: 49 U/L (ref 9–67)

## 2021-02-03 LAB — ANTI-SCLERODERMA ANTIBODY: Scleroderma (Scl-70) (ENA) Antibody, IgG: <1 AI

## 2021-02-06 ENCOUNTER — Ambulatory Visit: Payer: BLUE CROSS/BLUE SHIELD | Admitting: Pharmacist

## 2021-02-06 ENCOUNTER — Telehealth: Payer: Self-pay | Admitting: Pharmacist

## 2021-02-06 DIAGNOSIS — Z7189 Other specified counseling: Secondary | ICD-10-CM

## 2021-02-06 NOTE — Telephone Encounter (Signed)
ATC patient to review Esbriet vs. Ofev for our scheduled phone visit.Marland Kitchen  Phone went to VM. Requested return call.  Knox Saliva, PharmD, MPH, BCPS Clinical Pharmacist (Rheumatology and Pulmonology)

## 2021-02-06 NOTE — Telephone Encounter (Addendum)
Subjective:  Patient and his spouse called today by [facility] pharmacy team to review antifibrotics (Ofev vs Esbriet)   Patient was last seen and referred by Dr. Chase Caller on 01/24/21.  Pertinent past medical history includes ILD, depression, prostate cancer, psoriasis, SCCIS. Patient does have history of aortic stenosis but no history of MI, stroke, PE/DVT. Referred to Dr. Chase Caller from another patient seen in clinic.  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 Chase Caller. Since then, radiology confirmed classic features of chronic HP though patient does not have history of exposures. Patient has progressive phenotype.   History of elevated LFTs: No History of diarrhea, nausea, vomiting: No  Objective: Allergies  Allergen Reactions   Bee Venom Rash   Shellfish Allergy Hives   Pseudoephedrine Anxiety    Outpatient Encounter Medications as of 02/06/2021  Medication Sig   acetaminophen (TYLENOL) 325 MG tablet Take by mouth.   albuterol (VENTOLIN HFA) 108 (90 Base) MCG/ACT inhaler Inhale into the lungs.   aspirin-acetaminophen-caffeine (EXCEDRIN MIGRAINE) 250-250-65 MG tablet Take by mouth.   budesonide-formoterol (SYMBICORT) 160-4.5 MCG/ACT inhaler Inhale into the lungs.   Calcium Carb-Cholecalciferol (CALCIUM/VITAMIN D PO) Take 1 tablet by mouth daily.   Cetirizine HCl (ZYRTEC PO) Take by mouth.   fluorouracil (EFUDEX) 5 % cream Apply topically.   fluticasone (FLONASE) 50 MCG/ACT nasal spray Place into both nostrils daily.   fluticasone (FLONASE) 50 MCG/ACT nasal spray one spray by Both Nostrils route every evening.   gabapentin (NEURONTIN) 300 MG capsule Take by mouth.   ibuprofen (ADVIL) 200 MG tablet Take by mouth.   loperamide (IMODIUM) 2 MG capsule Take by mouth.   meclizine (ANTIVERT) 25 MG tablet    Multiple Vitamin (MULTIVITAMIN) capsule Take 1 capsule by mouth every morning.   naproxen sodium (ALEVE) 220 MG tablet Take by  mouth.   omeprazole (PRILOSEC) 40 MG capsule Take 40 mg by mouth 2 (two) times daily.   PAROXETINE HCL PO Take by mouth.   rosuvastatin (CRESTOR) 5 MG tablet Take 1 tablet by mouth daily.   SUMAtriptan (IMITREX) 100 MG tablet TAKE ONE TABLET BY MOUTH AT ONSET OF HEADACHE; MAY REPEAT ONE TABLET IN 2 HOURS IF NEEDED. MAX OF 200 MG PER DAY   No facility-administered encounter medications on file as of 02/06/2021.     Immunization History  Administered Date(s) Administered   Fluad Quad(high Dose 65+) 10/23/2017, 09/30/2019, 10/18/2020   Influenza-Unspecified 11/11/2009, 11/08/2011, 11/03/2013, 11/22/2014, 11/10/2015   Moderna Covid-19 Vaccine Bivalent Booster 2yr & up 10/24/2020   Moderna Sars-Covid-2 Vaccination 03/19/2019, 04/16/2019, 11/16/2019, 05/07/2020   Pneumococcal Conjugate-13 12/08/2007, 12/21/2015, 09/04/2018   Pneumococcal Polysaccharide-23 12/08/2007, 06/21/2017, 06/16/2018   Tdap 12/08/2007, 11/27/2018   Zoster Recombinat (Shingrix) 10/30/2013, 09/04/2018, 11/27/2018   Zoster, Live 10/30/2013   Zoster, Unspecified 10/30/2013      PFT's No results found for: FEV1, FVC, FEV1FVC, TLC, DLCO    CMP     Component Value Date/Time   NA 135 02/17/2016 1920   K 4.1 02/17/2016 1920   CL 103 02/17/2016 1920   CO2 25 02/17/2016 1920   GLUCOSE 132 (H) 02/17/2016 1920   BUN 16 02/17/2016 1920   CREATININE 1.05 02/17/2016 1920   CALCIUM 8.4 (L) 02/17/2016 1920   GFRNONAA >60 02/17/2016 1920   GFRAA >60 02/17/2016 1920      CBC    Component Value Date/Time   WBC 14.2 (H) 02/17/2016 1920   RBC 4.25 02/17/2016 1920   HGB 13.1  02/17/2016 1920   HCT 38.6 (L) 02/17/2016 1920   PLT 215 02/17/2016 1920   MCV 90.8 02/17/2016 1920   MCH 30.8 02/17/2016 1920   MCHC 33.9 02/17/2016 1920   RDW 14.4 02/17/2016 1920   LYMPHSABS 1.0 02/17/2016 1920   MONOABS 1.6 (H) 02/17/2016 1920   EOSABS 0.4 02/17/2016 1920   BASOSABS 0.0 02/17/2016 1920    LFTs drawn on 12/27/20 were wnl  (in [place] - copied below)  Glucose 70 - 99 mg/dL 90   BUN 8 - 27 mg/dL 13   Creatinine 0.76 - 1.27 mg/dL 0.93   eGFR >59 mL/min/1.73 89   BUN/Creatinine Ratio 10 - 24 14   Sodium 134 - 144 mmol/L 142   Potassium 3.5 - 5.2 mmol/L 4.1   Chloride 96 - 106 mmol/L 101   CO2 20 - 29 mmol/L 27   CALCIUM 8.6 - 10.2 mg/dL 8.9   Total Protein 6.0 - 8.5 g/dL 6.7   Albumin, Serum 3.8 - 4.8 g/dL 4.1   Globulin, Total 1.5 - 4.5 g/dL 2.6   Albumin/Globulin Ratio 1.2 - 2.2 1.6   Total Bilirubin 0.0 - 1.2 mg/dL 0.3   Alkaline Phosphatase 44 - 121 IU/L 118   AST 0 - 40 IU/L 23   ALT (SGPT) 0 - 44 IU/L 17    HRCT (02/14/21) - Stable examination again demonstrating extensive fibrotic interstitial lung disease, with a spectrum of findings which is once again categorized as most compatible with an alternative diagnosis (not usual interstitial pneumonia) per current ATS guidelines, 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  Assessment and Plan  Esbriet and Ofev Medication Management Thoroughly counseled patient and wife on the efficacy, mechanism of action, dosing, administration, adverse effects, and monitoring parameters of Esbriet and Ofev.  Patient verbalized understanding.   Goals of Therapy: Will not stop or reverse the progression of ILD. It will slow the progression of ILD. - Patient aware that Jacklynn Bue is approved only for IPF and if he wanted to try this first, it may take some time to get it approved for non-IPF diagnosis.  - Patient aware that Ofev is approved for both IPF and ILD w progressive phenotype. We reviewed differential but minimal cardiac risks with Ofev as compared to no known cardiac risks with pirfenidone.  Dosing: Starting dose will be Esbriet 267 mg 1 tablet three times daily for 7 days, then 2 tablets three times daily for 7 days, then 3 tablets three times daily.  Maintenance dose will be 801 mg 1 tablet three times daily if tolerated.  Ofev:  $Remo'150mg'hdgsy$  twice daily - no titration period  Adverse Effects: Esbriet:  Nausea, vomiting, diarrhea, weight loss Abdominal pain GERD Sun sensitivity/rash - patient advised to wear sunscreen when exposed to sunlight Dizziness Fatigue Ofev: Nausea, vomiting, diarrhea (2 in 3 patients) appetite loss, weight loss - management of diarrhea with loperamide discussed including max use of 48 hours and max of 8 capsules per day. Abdominal pain (up to 1 in 5 patients) Nasopharyngitis (13%), UTI (6%) Risk of thrombosis (3%) and acute MI (2%) Hypertension (5%) Dizziness Fatigue (10%)  Monitoring: Esbriet: Monitor for diarrhea, nausea and vomiting, GI perforation, hepatotoxicity  Monitor LFTs - baseline, monthly for first 6 months, then every 3 months routinely Ofev: Monitor for diarrhea, nausea and vomiting, GI perforation, hepatotoxicity  Monitor LFTs - baseline, monthly for first 3 months, then every 3 months routinely CBC w differential at baseline and every 3 months routinely  Access: Will have  to pursue Ofev or Esbriet through patient assistance - patient should qualify for pt assistance based on his estimated household income that he provided today Patient aware of income document requiremnt for Ofev BI C[employer] pt assitance application He will plan to discuss further with wife and plan to make antifibrotic decision with Dr. Chase Caller at f/u visit  Medication Reconciliation A drug regimen assessment was performed, including review of allergies, interactions, disease-state management, dosing and immunization history. Medications were reviewed with the patient, including name, instructions, indication, goals of therapy, potential side effects, importance of adherence, and safe use.  DDI between pirfenidone and paroxetine (Level B) -  paroxetine is CYP2D6 inhibitor. If CYP1A2 inhibitor (like ciprofloxacin) is added onto medication list (none active currently) then pirfenidone dose should be reduced  due to risk for accumulation AUC 4-fold increase. No changes warranted based on current med list  This appointment required 45 minutes of patient care (this includes precharting, chart review, review of results, face-to-face care, etc.).  Thank you for involving pharmacy to assist in providing this patient's care.   Knox Saliva, PharmD, MPH, BCPS Clinical Pharmacist (Rheumatology and Pulmonology)

## 2021-02-13 ENCOUNTER — Ambulatory Visit (HOSPITAL_BASED_OUTPATIENT_CLINIC_OR_DEPARTMENT_OTHER)
Admission: RE | Admit: 2021-02-13 | Discharge: 2021-02-13 | Disposition: A | Discharge status: Home / Self Care | Payer: Medicare Other | Source: Ambulatory Visit | Attending: Internal Medicine | Admitting: Internal Medicine

## 2021-02-13 ENCOUNTER — Other Ambulatory Visit: Payer: Self-pay

## 2021-02-13 DIAGNOSIS — J849 Interstitial pulmonary disease, unspecified: Secondary | ICD-10-CM | POA: Insufficient documentation

## 2021-02-13 DIAGNOSIS — R911 Solitary pulmonary nodule: Secondary | ICD-10-CM | POA: Insufficient documentation

## 2021-02-21 ENCOUNTER — Ambulatory Visit (INDEPENDENT_AMBULATORY_CARE_PROVIDER_SITE_OTHER): Payer: Medicare Other | Admitting: Internal Medicine

## 2021-02-21 ENCOUNTER — Other Ambulatory Visit: Payer: Self-pay

## 2021-02-21 DIAGNOSIS — J849 Interstitial pulmonary disease, unspecified: Secondary | ICD-10-CM | POA: Diagnosis not present

## 2021-02-21 LAB — PULMONARY FUNCTION TEST
DL/VA % pred: 106 %
DL/VA: 4.38 ml/min/mmHg/L
DLCO cor % pred: 63 %
DLCO cor: 15.57 ml/min/mmHg
DLCO unc % pred: 63 %
DLCO unc: 15.57 ml/min/mmHg
FEF 25-75 Post: 3.75 L/sec
FEF 25-75 Pre: 2.33 L/sec
FEF2575-%Change-Post: 60 %
FEF2575-%Pred-Post: 157 %
FEF2575-%Pred-Pre: 97 %
FEV1-%Change-Post: 10 %
FEV1-%Pred-Post: 57 %
FEV1-%Pred-Pre: 51 %
FEV1-Post: 1.76 L
FEV1-Pre: 1.6 L
FEV1FVC-%Change-Post: 2 %
FEV1FVC-%Pred-Pre: 118 %
FEV6-%Change-Post: 7 %
FEV6-%Pred-Post: 49 %
FEV6-%Pred-Pre: 45 %
FEV6-Post: 1.95 L
FEV6-Pre: 1.81 L
FEV6FVC-%Change-Post: 0 %
FEV6FVC-%Pred-Post: 105 %
FEV6FVC-%Pred-Pre: 105 %
FVC-%Change-Post: 7 %
FVC-%Pred-Post: 46 %
FVC-%Pred-Pre: 43 %
FVC-Post: 1.95 L
FVC-Pre: 1.82 L
Post FEV1/FVC ratio: 91 %
Post FEV6/FVC ratio: 100 %
Pre FEV1/FVC ratio: 88 %
Pre FEV6/FVC Ratio: 99 %
RV % pred: 75 %
RV: 1.72 L
TLC % pred: 56 %
TLC: 3.76 L

## 2021-02-21 NOTE — Progress Notes (Signed)
PFT done today. 

## 2021-02-23 ENCOUNTER — Other Ambulatory Visit: Payer: Self-pay

## 2021-02-23 ENCOUNTER — Encounter: Payer: Self-pay | Admitting: Internal Medicine

## 2021-02-23 ENCOUNTER — Encounter: Payer: Self-pay | Admitting: *Deleted

## 2021-02-23 ENCOUNTER — Ambulatory Visit (INDEPENDENT_AMBULATORY_CARE_PROVIDER_SITE_OTHER): Payer: Medicare Other | Admitting: Internal Medicine

## 2021-02-23 ENCOUNTER — Telehealth: Payer: Self-pay | Admitting: Pharmacist

## 2021-02-23 VITALS — BP 122/80 | HR 88 | Temp 98.0°F | Ht 67.0 in | Wt 218.4 lb

## 2021-02-23 DIAGNOSIS — R768 Other specified abnormal immunological findings in serum: Secondary | ICD-10-CM | POA: Diagnosis not present

## 2021-02-23 DIAGNOSIS — R0989 Other specified symptoms and signs involving the circulatory and respiratory systems: Secondary | ICD-10-CM | POA: Diagnosis not present

## 2021-02-23 DIAGNOSIS — J849 Interstitial pulmonary disease, unspecified: Secondary | ICD-10-CM | POA: Diagnosis not present

## 2021-02-23 NOTE — Progress Notes (Addendum)
Cardiology Dr. Loni Beckwith 02/15/20 Dustin Dean is 71 y.o. year old male with a history of No prior cardiovascular disease who presents to [place] cardiology for the evaluation of a heart murmur. This patient is a non-smoker and denies a previous history of myocardial infarction, congestive heart failure, or valvular heart disease. 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 He really has little in the way of exertional chest symptoms. Occasionally according to the wife if he walks up an incline briskly he may have some minor shortness of breath but remains active and has little in the way limitation. He denies a previous history of myocardial infarction, congestive heart failure, or known valvular heart disease. At a recent examination he was found to have a heart murmur and an echocardiogram was performed. It demonstrated a peak gradient across his aortic valve of 30 mmHg and an aortic valve area of 1.1 cm. This was read as being consistent with moderate aortic stenosis. I had a fairly significant discussion with the patient and his wife about the implications of his aortic valve and the need to follow the aortic stenosis progression over time.  11/14/2020 office visit with Dustin Dean pulmonary physician assistant at [facility] has past medical history of restrictive lung disease, interstitial lung disease, allergic rhinitis, laryngeal pharyngeal reflux, arthritis, lung nodules and persistent cough. He is a non-smoker and has never smoked. He has medication allergy to pseudoephedrine.  Dustin Dean presents today with complaint of a progressively worsening shortness of breath and dyspnea on exertion over the past several months. We have been following his interstitial lung disease since 2018. He has been stable without complaint of dyspnea on exertion over the past several years. He indicates that recently he has had more shortness of  breath with exertion. He and his wife will walk the dog in their usual route through the neighborhood that they have done for many years. He reports that he will get short of breath along the root in a way that he has never been bothered with previously. He has recently requested an albuterol inhaler and reports that it has been helpful. He has been using Symbicort 160/4.5 twice daily but has been having difficulty using it successfully as he has been coughing significantly and loses a lot of the medication. A recent chest CT conducted 10/24/2020 notes a progression in the interstitial changes. A 9 mm right lower lobe nodule is also noted. The plan is to have a 62-monthfollow-up chest CT conducted 02/04/2021. A 6-minute walk test conducted today shows oxygen desaturation and need for supplemental oxygen. I will place the order for supplemental oxygen. [facility].   Patient at rest on RA with baseline sats : 97% After exertion for 3 min on RA pt's sats were : 86% Patient placed on oxygen via nasal cannula at 2 lpm flow via portable oxygen concentrator device Exertion for 6 mins with oxygen confirmed final sats at 94%   #1 lung nodules Chest CT 10/24/2020 shows 9 mm right lower lobe lung nodule Follow-up chest CT scheduled for 353-montheevaluation in January 2023  2. Interstitial lung disease Chest CT 11/04/2020 notes fibrotic interstitial lung disease with evidence of progression compared to 08/28/2020 [facility]   #Primary Care dec  2022 Osteopenia Due for DXA. Prostate cancer Saw Dr. Louis Meckel at [facility]. Biopsy + for cancer. They are just watching it. He is going to have MRI and another biopsy next visit.   OV 01/24/2021 -evaluation and transfer of care to Dr. Chase Caller in ILD center in [place].  Referred by Marlane Mingle patient support group leader for the pulmonary fibrosis foundation support group  Subjective:  Patient ID: Dustin Dean, male , DOB: October 24, 1950 , age 77 y.o. , MRN: [id number] , ADDRESS: [place] [id number] PCP Joneen Boers, MD Patient Care Team: Joneen Boers, MD as PCP - General (Family Medicine)  This Provider for this visit: Treatment Team:  Attending Provider: Brand Males, MD    01/24/2021 -   Chief Complaint  Patient presents with   Consult    Pt is here for a switch of providers to take over care for his pulmonary fibrosis. 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 Pt does become SOB with activities and also states that he does have a chronic cough.     HPI  Dustin Dean 71 y.o. -new evaluation for interstitial lung disease.  History is provided by review of the chart and also talking to the patient and his wife.  He tells me that he has had chronic cough since his teenage years.  But for the last few to several years its been more consistent.  He was seeing the physician assistant at [facility].  Somewhere along the way he got referred to Dr. Dennison Mascot right at [facility] some 3 years ago.  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  After that cough is largely improved except for occasional exacerbations in the fall in the winter.  Then in 2018 sometime after he went on gabapentin he was informed by the physician assistant that he might have early ILD.  He does not recollect any serology or biopsy being done.  He does recollect a few pulmonary function test.  Then all of a sudden starting September 2022 his cough "got out of hand" and also developed worsening shortness of breath.  This the first time he started developing worsening shortness of breath.  Then by November 2022 started needing exertional oxygen leading to current symptoms.  He was referred to [facility] but he preferred to establish with the ILD  center here in [place] with [place] based on [facility] recommendation.  He found Marlane Mingle through Internet search for pulmonary fibrosis foundation.  His current symptom score is below.  He is not on any antifibrotic.  [place] Integrated Comprehensive ILD Questionnaire  Symptoms:  SYMPTOM SCALE - ILD 01/24/2021  Current weight   O2 use 2L [place] with ex  Shortness of Breath 0 -> 5 scale with 5 being worst (score 6 If unable to do)  At rest 0  Simple tasks - showers, clothes change, eating, shaving 1  Household (dishes, doing bed, laundry) 1  Shopping 2  Walking level at own pace 5  Walking up Stairs 5  Total (30-36) Dyspnea Score 14  How bad is your cough? 2  How bad is your fatigue 1  How bad is nausea 00000  How bad is vomiting?  0  How bad is diarrhea? 0  How bad is anxiety? 0  How bad is depression 0  Any chronic pain - if so where and how bad 0       Past Medical History :  -  He has obesity BMI 33-34 - Denies any collagen vascular disease - He is known to have aortic stenosis -sees cardiologist at [facility].  December 2021 echocardiogram EF 55 to 60% with tricuspid aortic valve and moderate stenosis normal right ventricle -Denies any asthma or COPD.  Denies any heart failure denies any collagen vascular disease -Denies snoring or excessive daytime somnolence.  Denies stroke.  Denies formal diagnosis of sleep apnea denies formal diagnosis of pulmonary hypertension [echo a year ago did not show pulmonary hypertension] -Has had vaccination against COVID but has not had COVID disease -Recent diagnosis of late 2022 early stage prostate cancer at [facility] on observation treatment -Has history of skin cancer for which he apply sunscreen regularly  ROS:  -Shortness of breath present Cough chronic plus -Has intermittent chronic diarrhea and takes Imodium but no other GI side effects -Possible Raynaud's for the last several decades  FAMILY HISTORY of LUNG DISEASE:   Denies family for pulmonary fibrosis.  Denies family Struve COPD or asthma sarcoid or cystic fibrosis.  Denies family history of hypersensitive pneumonitis.  Denies autoimmune disease.  Denies any premature graying of the hair or albinism  PERSONAL EXPOSURE HISTORY:  No prior cigarette smoking -No marijuana use of cocaine use no intravenous drug use.  HOME  EXPOSURE and HOBBY DETAILS :  -Single-family home in a one-story space.  [place] setting age of the current home is 31 years.  He is lived there for 6 years.  He does use a feather pillow and this occasionally mold or mildew in the bathroom.  All his life he is done active gardening with mulch and woodchips and occasionally damp soil.  He is worked out in the yard quite a bit and does gardening.  He enjoys the gardening.  OCCUPATIONAL HISTORY (122 questions) : -He worked in the [employer] at [employer] and then [employer] no [employer].  He does not remember if the buildings had mold in it.  He does not think so -Detail greater than 100 question organic and inorganic antigen history exposure is negative  PULMONARY TOXICITY HISTORY (27 items):  Denies  INVESTIGATIONS: Report of pulmonary function test but I am not able to get it in Care Everywhere     Simple office walk 185 feet x  3 laps goal with forehead probe 01/24/2021    O2 used Ra at rest, desat at 1 lap and then walked with 2L piulse   Number laps completed 1 lap on RA, then 3 laps on 2L   Comments about pace x   Resting Pulse Ox/HR 96% and 88/min at 1 lap   Final Pulse Ox/HR 92% and 115/min on 3 laps on 2L pulsed   Desaturated </= 88% no   Desaturated <= 3% points yes   Got Tachycardic >/= 90/min yes   Symptoms at end of test Modreate dyspnea   Miscellaneous comments Corrected with 2L [place]      High-resolution CT scan of the chest August 2020  -There is a report of honeycombing but without any craniocaudal gradient.  Air-trapping reported  alternative pattern more suggestive of chronic hypersensitive pneumonitis.  But there is already progression from August 2019 -Read by Dr. Vinnie Langton   CT Chest data- HRCT 11/04/20 unable for my visualization.   CLINICAL DATA:  Worsening shortness of breath   EXAM:  CT CHEST WITHOUT CONTRAST   TECHNIQUE:  Multidetector CT imaging of the chest was performed following the  standard protocol  without intravenous contrast. High resolution  imaging of the lungs, as well as inspiratory and expiratory imaging,  was performed.   COMPARISON:  Chest CT dated August 29, 2018   FINDINGS:  Cardiovascular: Normal heart size. No pericardial effusion. Coronary  artery calcifications of the circumflex. Aortic valve  calcifications. Minimal calcified plaque of the thoracic aorta.   Mediastinum/Nodes: Mildly enlarged mediastinal lymph nodes,  unchanged compared to prior exam and likely reactive. Esophagus and  thyroid are unremarkable.   Lungs/Pleura: Central airways are patent. Bilateral air trapping.  Peribronchovascular and subpleural reticular opacities with traction  bronchiectasis and no clear craniocaudal predominance. Honeycomb  change primarily seen in the anterior upper lobes is increased when  compared to prior exam. New linear nodular opacity of the right  lower lobe measuring up to 9 mm on series 4, image 210.   Upper Abdomen: No acute abnormality.   Musculoskeletal: No chest wall mass or suspicious bone lesions  identified.   IMPRESSION:  Fibrotic interstitial lung disease with evidence of progression when  compared with August 29, 2018 prior exam. Favor fibrotic  hypersensitivity pneumonitis given presence of air trapping.  Findings are suggestive of an alternative diagnosis (not UIP) per  consensus guidelines: Diagnosis of Idiopathic Pulmonary Fibrosis: An  Official ATS/ERS/JRS/ALAT Clinical Practice Guideline. [place], Iss 5, [phone], Sep 15 2016.   New linear nodular opacity of the right lower lobe measuring up to 9  mm, likely an area of focal fibrosis. Recommend follow-up chest CT  in 3 months to ensure stability.   Aortic Atherosclerosis (ICD10-I70.0).   Addendum 01/26/2021 -radiology Dr. Weber Cooks wrote back saying CT scan is classic for chronic HP.  We will call the patient and get the patient started on nintedanib counseling which is first-line for progressive non-- IPF ILD.  If he is reluctant then we will do pirfenidone.  We will set up pharmacy counseling.  If he is reluctant for nintedanib based on risk versus benefit we will do pirfenidone  Electronically Signed    By: Yetta Glassman M.D.    On: 11/04/2020 14:17  No results found.   Phone visit 01/28/21  Emilyh  LEt Dustin Dean know ahead of the visit followup with me on 02/23/21   A) making referral to [facility] to discuss ofev which would be first line baed on what radiologist told me about CT and fact rheumaotid facot is positive  B) rheumatoid factor strongly positive - refer rheumatology Dr Deveshwar/Rice  C) I reviewed echo report (below) Will discuss at followup  Aortic Valve: There is moderate stenosis, with peak and mean gradients  of 44.000 and 27.000 mmHg.  Aortic valve area calculates to approximately  1.4 cm    Mitral Valve: There is mild regurgitation.    Tricuspid Valve: There is mild regurgitation.    Tricuspid Valve: The right ventricular systolic pressure is normal (<36  mmHg).   1.  Adequate 2D M-mode and color flow Doppler echocardiogram demonstrates  normal left ventricular chamber size and contractility.  There may be mild  left ventricular hypertrophy.  Ejection fraction is estimated 65%  2.  The aortic valve is thickened and demonstrates reduced leaflet  mobility.  There is mild to moderate aortic stenosis with aortic valve  area calculating to 1.4 cm  3.  Mild mitral annular calcification is noted but there is good leaflet   mobility.  Mild mitral insufficiency is noted  4.  Tricuspid valve appears to  be normal  5.  The atria are grossly normal size bilaterally  6.  The right ventricular chamber size and function is normal  6.  The pericardium is of normal thickness there is no effusion  OV 02/23/2021  Subjective:  Patient ID: Dustin Dean, male , DOB: 1950/05/17 , age 66 y.o. , MRN: [id number] , ADDRESS: [place] [id number] PCP Joneen Boers, MD Patient Care Team: Joneen Boers, MD as PCP - General (Family Medicine)  This Provider for this visit: Treatment Team:  Attending Provider: Brand Males, MD    02/23/2021 -   Chief Complaint  Patient presents with   Follow-up    Pt recently had a HRCT and PFT and is here today to discuss the results.   Chronic cough on gabapentin  Interstitial lung disease work-up in progress -concern for hypersensitive pneumonitis  -Progressive phenotype between August 2020 and October 2022  9 mm right lower lobe nodule seen in October 2022 for the first time: Stable January 2023  HPI Dustin Dean 71 y.o. -since his last visit Dr. Weber Cooks the radiologist did look at the CT scan and this confirmed his CT is not consistent with UIP but more likely an alternate pattern consistent with hypersensitive pneumonitis as seen by air trapping.  Patient has had serology work-up in which it is all negative except rheumatoid factor strongly positive.  He is yet to see the rheumatologist.  He does have significant restrictions on the PFTs with reduced DLCO consistent with his obesity and ILD.  His symptoms are overall stable.  Did go through his exposure history again.  He says in 200 07/2006 and again in 2012 he had had 3 episodes of pneumonia.  All of this happened when he entered school buildings.  He was living in the same house at that time but the house itself was brand-new and there is no mold or mildew.  He is wondering about crawlspace because only [place] he has seen crawlspace but he said he has never been in the crawl space.  He does not know if there was mold in the crawl space.  I told him it is doubtful that mold or mildew from the crawlspace can get into the house.  He did have a feather pillow but since his last visit he is thrown that out.  He does do gardening and does get exposed to dampness.  At this point in time did discuss with him that he does have ILD and it is progressive.  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  Unclear if the positive rheumatoid factor is playing a role it could be if he has positive for rheumatoid arthritis.  Did indicate that ultimately only a biopsy can put to rest if he has IPF [being Caucasian gentleman greater than 65 is classic phenotype for IPF] but did indicate to him that given progression does not matter whether it is IPF or non-IPF antifibrotic's is indicated.  He is already met with the pharmacist and discussed the 2 antifibrotic's.  Did indicate to him that nintedanib is first-line and non-- IPF progressive phenotype.  Did discuss about the diarrhea.  Denies any contraindication.  Therefore we will start this.  Did discuss whether diarrhea is willing to go through this.  Also discussed about ILD-Pro registry.  He is interested given the consent form.  Did indicate to him that it probably would be beneficial to do some kind of a limited work-up in differentiating his  etiology.  The reasons would be to participate in clinical trials but also in case he needs immunosuppression such as prednisone or CellCept [indicated for hypersensitive pneumonitis but not for IPF] having a specific diagnosis would be beneficial.  I did think that his obesity puts him at some risk for getting surgical lung biopsy and therefore it might be better to start off with bronchoscopy with lavage and transbronchial biopsies.  We can address this in the future.  This visit was just focused on  therapies.  Regarding his 9 mm lower lobe nodule: He had a CT scan of the chest and shows this is stable from October 2022 through January 2023.  The CT scan again reads as alternative pattern consistent with chronic HP.  SYMPTOM SCALE - ILD 01/24/2021 02/23/2021   Current weight    O2 use 2L [place] with ex 2L with ex  Shortness of Breath 0 -> 5 scale with 5 being worst (score 6 If unable to do)   At rest 0 0  Simple tasks - showers, clothes change, eating, shaving 1 1  Household (dishes, doing bed, laundry) 1 1  Shopping 2 1  Walking level at own pace 5 2  Walking up Stairs 5 3  Total (30-36) Dyspnea Score 14 8  How bad is your cough? 2 3  How bad is your fatigue 1 2  How bad is nausea 00000 0  How bad is vomiting?  0 0  How bad is diarrhea? 0 0  How bad is anxiety? 0 0  How bad is depression 0 0  Any chronic pain - if so where and how bad 0 0     CT Chest data January 2023  No results found.  Mediastinum/Nodes: Multiple prominent borderline but nonenlarged mediastinal and bilateral hilar lymph nodes, similar to the prior study esophagus is unremarkable in appearance. No axillary lymphadenopathy.   Lungs/Pleura: High-resolution images again demonstrate widespread but patchy areas of ground-glass attenuation, septal thickening, subpleural reticulation, thickening of the peribronchovascular interstitium, cylindrical and varicose traction bronchiectasis, peripheral bronchiolectasis and extensive honeycombing. There is no discernible craniocaudal gradient. The most extensive areas of honeycombing are anterior lung, predominantly in the upper lobes and right middle lobe. Some areas of the lung bases demonstrate relative sparing. Inspiratory and expiratory imaging demonstrates mild to moderate air trapping indicative of small airways disease. No definite progression compared to the recent prior study. No acute consolidative airspace disease. No pleural effusions.  Previously described nodular area of architectural distortion in the right lower lobe (axial image 106 of series 8) is stable measuring 8 mm on today's examination, likely part of the underlying fibrosis. No other definite suspicious appearing pulmonary nodules or masses are noted.   Upper Abdomen: Unremarkable.   Musculoskeletal: There are no aggressive appearing lytic or blastic lesions noted in the visualized portions of the skeleton.   IMPRESSION: 1. 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 2. Previously noted nodular area of architectural distortion in the right lower lobe is unchanged, likely part of the fibrosis rather than a pulmonary nodule. 3. There are calcifications of the aortic valve and mitral annulus. Echocardiographic correlation for evaluation of potential valvular dysfunction may be warranted if clinically indicated. 4. Aortic atherosclerosis, in addition to 2 vessel coronary artery disease. Please note that although  the presence of coronary artery calcium documents the presence of coronary artery disease, the severity of this disease and any potential stenosis cannot be assessed on this non-gated CT examination. Assessment for potential risk factor modification, dietary therapy or pharmacologic therapy may be warranted, if clinically indicated.   Aortic Atherosclerosis (ICD10-I70.0).     Electronically Signed   By: Vinnie Langton M.D.   On: 02/14/2021 06:17 stable January 2023  PFT  PFT Results Latest Ref Rng & Units [facility]21[facility] - results NA Feb 2022 possibly the 15th, [facility] 02/25/19 at [place] [facility] from chart eview  FVC-Pre L 1.82      FVC-Predicted Pre % 43  38% 55%  FVC-Post L 1.95     FVC-Predicted Post % 46     Pre FEV1/FVC % % 88  81 87  Post FEV1/FCV % % 91     FEV1-Pre L 1.60     FEV1-Predicted Pre % 51  40% 61%  FEV1-Post L 1.76     DLCO uncorrected ml/min/mmHg 15.57     DLCO UNC% % 63   87%  DLCO corrected ml/min/mmHg 15.57     DLCO COR %Predicted % 63     DLVA Predicted % 106     TLC L 3.76     TLC % Predicted % 56     RV % Predicted % 75        Latest Reference Range & Units 01/27/21 15:53 01/27/21 15:54  Anti Nuclear Antibody (ANA) NEGATIVE   NEGATIVE  Angiotensin-Converting Enzyme 9 - 67 U/L  49  Anti JO-1 0.0 - 0.9 AI  <6.2  Cyclic Citrullin Peptide Ab UNITS  <16  ds DNA Ab IU/mL  1  ENA RNP Ab 0.0 - 0.9 AI  0.4  RA Latex Turbid. <14 IU/mL  323 (H)  SSA (Ro) (ENA) Antibody, IgG <1.0 NEG AI  <1.0 NEG  SSB (La) (ENA) Antibody, IgG <1.0 NEG AI  <1.0 NEG  Scleroderma (Scl-70) (ENA) Antibody, IgG <1.0 NEG AI  <1.0 NEG  QUANTIFERON-TB GOLD PLUS   Rpt  A.Fumigatus #1 Abs Negative  Negative   Micropolyspora faeni, IgG Negative  Negative   Thermoactinomyces vulgaris, IgG Negative  Negative   A. Pullulans Abs Negative  Negative   Thermoact. Saccharii Negative  Negative   Pigeon Serum Abs Negative  Negative   (H): Data is abnormally high Rpt: View report in Results Review for more information   has a past medical history of Flu, Pneumonia, and Skin cancer.   reports that he has never smoked. He has never used smokeless tobacco.  Past Surgical History:  Procedure Laterality Date   HERNIA REPAIR     SKIN CANCER EXCISION      Allergies  Allergen Reactions   Bee Venom Rash   Shellfish Allergy Hives   Pseudoephedrine Anxiety    Immunization History  Administered Date(s) Administered   Fluad Quad(high Dose 65+) 10/23/2017, 09/30/2019, 10/18/2020   Influenza-Unspecified 11/11/2009, 11/08/2011, 11/03/2013, 11/22/2014, 11/10/2015   Moderna Covid-19 Vaccine Bivalent Booster 2yr & up  10/24/2020   Moderna Sars-Covid-2 Vaccination 03/19/2019, 04/16/2019, 11/16/2019, 05/07/2020   Pneumococcal Conjugate-13 12/08/2007, 12/21/2015, 09/04/2018   Pneumococcal Polysaccharide-23 12/08/2007, 06/21/2017, 06/16/2018   Tdap 12/08/2007, 11/27/2018   Zoster Recombinat (Shingrix) 10/30/2013, 09/04/2018, 11/27/2018   Zoster, Live 10/30/2013   Zoster, Unspecified 10/30/2013    No family history on file.   Current Outpatient Medications:    acetaminophen (TYLENOL) 325 MG tablet, Take  by mouth., Disp: , Rfl:    albuterol (VENTOLIN HFA) 108 (90 Base) MCG/ACT inhaler, Inhale into the lungs., Disp: , Rfl:    aspirin-acetaminophen-caffeine (EXCEDRIN MIGRAINE) 250-250-65 MG tablet, Take by mouth., Disp: , Rfl:    budesonide-formoterol (SYMBICORT) 160-4.5 MCG/ACT inhaler, Inhale into the lungs., Disp: , Rfl:    Calcium Carb-Cholecalciferol (CALCIUM/VITAMIN D PO), Take 1 tablet by mouth daily., Disp: , Rfl:    Cetirizine HCl (ZYRTEC PO), Take by mouth., Disp: , Rfl:    fluorouracil (EFUDEX) 5 % cream, Apply topically., Disp: , Rfl:    fluticasone (FLONASE) 50 MCG/ACT nasal spray, Place into both nostrils daily., Disp: , Rfl:    gabapentin (NEURONTIN) 300 MG capsule, Take by mouth., Disp: , Rfl:    ibuprofen (ADVIL) 200 MG tablet, Take by mouth., Disp: , Rfl:    loperamide (IMODIUM) 2 MG capsule, Take by mouth., Disp: , Rfl:    meclizine (ANTIVERT) 25 MG tablet, , Disp: , Rfl:    Multiple Vitamin (MULTIVITAMIN) capsule, Take 1 capsule by mouth every morning., Disp: , Rfl:    naproxen sodium (ALEVE) 220 MG tablet, Take by mouth., Disp: , Rfl:    omeprazole (PRILOSEC) 40 MG capsule, Take 40 mg by mouth daily., Disp: , Rfl:    PAROXETINE HCL PO, Take by mouth., Disp: , Rfl:    rosuvastatin (CRESTOR) 5 MG tablet, Take 1 tablet by mouth daily., Disp: , Rfl:    SUMAtriptan (IMITREX) 100 MG tablet, TAKE ONE TABLET BY MOUTH AT ONSET OF HEADACHE; MAY REPEAT ONE TABLET IN 2 HOURS IF NEEDED. MAX OF 200  MG PER DAY, Disp: , Rfl:       Objective:   Vitals:   02/23/21 1405  BP: 122/80  Pulse: 88  Temp: 98 F (36.7 C)  TempSrc: Oral  SpO2: 97%  Weight: 218 lb 6.4 oz (99.1 kg)  Height: _0  (1.702 m)    Estimated body mass index is 34.21 kg/m as calculated from the following:   Height as of this encounter: _1  (1.702 m).   Weight as of this encounter: 218 lb 6.4 oz (99.1 kg).  _2 @  Filed Weights   02/23/21 1405  Weight: 218 lb 6.4 oz (99.1 kg)     Physical Exam    General: No distress. Looks well obese Neuro: Alert and Oriented x 3. GCS 15. Speech normal Psych: Pleasant Ext: Stigmata of Connective Tissue Disease - none HEENT: Normal upper airway. PEERL +. No post nasal drip        Assessment:       ICD-10-CM   1. ILD (interstitial lung disease) ([place])  J84.9 Ambulatory referral to Rheumatology    2. Rheumatoid factor positive  R76.8 Ambulatory referral to Rheumatology    3. 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  - Unclear if positive rheumatoid factor is playing a role - Glad you got rid of feather pillow  Plan -- stop gardening - start ofev per protocol - hold off on prednisone due to side effect profile; will reserve it if  you have continued progression  - lose weight through diet -refer rheumatology - use o2 with exertio; keep pulse ox  >  88% - take consent  form for ILD PRO registry study -discussed about the study -We will post for the multidisciplinary case conference [sent email about this] - will consider bronchoscopy with lavage +/- transbronchial biopsy sometime in spring-fall 2023   Right lower lobe  nodule 9 mm -seen on CT October 2022 -> stable January 2023  Plan At next visit we will order 32-monthCT scan of the chest fr the summer 2023  Chronic cough -due to  interstitial lung disease   - on gabapentin for cough by Dr MDuane Boston seems to have helped  Plan - contineu gabapentin but can aim to slowly taper over time and reasess if needed  Nonrheumatic aortic valve stenosis  Reassured by Dr ALoni Beckwithin 2023  Plan  - per DR [place]Elonda Huskyat [place] Obesity - BMI 33.8  Plan  - Ultimately significant weight loss is required; please work aggresivel on this for calendar year 2023  Followup   - 6 weeks with Drr R or app for progress with ofev  ( Level 05 visit: Estb 40-54 min in  visit type: on-site physical face to visit  in total care time and counseling or/and coordination of care by this undersigned MD - Dr MBrand Males This includes one or more of the following on this same day 02/23/2021: pre-charting, chart review, note writing, documentation discussion of test results, diagnostic or treatment recommendations, prognosis, risks and benefits of management options, instructions, education, compliance or risk-factor reduction. It excludes time spent by the [place] staff in the care of the patient. Actual time 470min)   SIGNATURE    Dr. MBrand Males M.D., F.C.C.P,  Pulmonary and Critical Care Medicine Staff Physician, CHayden LakeDirector - Interstitial Lung Disease  Program  Pulmonary [place] [id number] Pager: [phone] If no answer or between  15:00h - 7:00h: call [phone] Telephone: [phone]  6:06 PM 02/23/2021

## 2021-02-23 NOTE — Telephone Encounter (Signed)
Received new start paperwork for Ofev.  Submitted a Prior Authorization request to [facility] for OFEV via CoverMyMeds. Will update once we receive a response.  Key: [id number]  [employer] patient assistance application placed in PAP pending info folder in pharmacy office - awaiting PA approval  Knox Saliva, PharmD, MPH, BCPS Clinical Pharmacist (Rheumatology and Pulmonology)

## 2021-02-23 NOTE — Patient Instructions (Addendum)
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  - Unclear if positive rheumatoid factor is playing a role - Glad you got rid of feather pillow  Plan -- stop gardening - start ofev per protocol - hold off on prednisone due to side effect profile; will reserve it if  you have continued progression  - lose weight through diet -refer rheumatology - use o2 with exertio; keep pulse ox  > 88% - take consent  form for ILD PRO registry study -discussed about the study -We will post for the multidisciplinary case conference [sent email about this] - will consider bronchoscopy with lavage +/- transbronchial biopsy sometime in spring-fall 2023   Right lower lobe nodule 9 mm -seen on CT October 2022 -> stable January 2023  Plan At next visit we will order 54-month CT scan of the chest fr the summer 2023  Chronic cough -due to  interstitial lung disease   - on gabapentin for cough by Dr Duane Boston; seems to have helped  Plan - contineu gabapentin but can aim to slowly taper over time and reasess if needed  Nonrheumatic aortic valve stenosis  Reassured by Dr Loni Beckwith in 2023  Plan  - per DR Elonda Husky at [place] [facility]  Obesity - BMI 33.8  Plan  - Ultimately significant weight loss is required; please work aggresivel on this for calendar year 2023  Followup   - 6 weeks with Drr R or app for progress with ofev

## 2021-02-24 ENCOUNTER — Other Ambulatory Visit (HOSPITAL_COMMUNITY): Payer: Self-pay

## 2021-02-24 NOTE — Telephone Encounter (Signed)
Received notification from [facility] regarding a prior authorization for [place]. Authorization has been APPROVED from 01/15/21 to 01/14/22.   Per test claim, copay for 30 days supply is $3232.80 Case ID: [id number]  Submitted Patient Assistance Application to [facility] for OFEV along with provider portion, patient portion, PA and income documents. Will update patient when we receive a response.  Fax# [phone] Phone# [phone]  Knox Saliva, PharmD, MPH, BCPS Clinical Pharmacist (Rheumatology and Pulmonology)

## 2021-03-01 ENCOUNTER — Encounter: Payer: Self-pay | Admitting: Internal Medicine

## 2021-03-16 ENCOUNTER — Other Ambulatory Visit: Payer: Self-pay | Admitting: Urology

## 2021-03-16 DIAGNOSIS — C61 Malignant neoplasm of prostate: Secondary | ICD-10-CM

## 2021-03-21 ENCOUNTER — Encounter: Payer: Self-pay | Admitting: Internal Medicine

## 2021-03-21 NOTE — Telephone Encounter (Signed)
Yes these all can be side effects of nintedanib.  He has appointment coming up with me on 04/14/2021 but I am traveling out of town that day. ? ?Plan ?- I only have 6 patients to see on 03/23/2021 Thursday afternoon.  He can be given a video visit so I can discuss as to his plan.  It is kind of too complicated to put on MyChart message ? ?-If the symptoms are intolerable he should just hold the nintedanib till then ?

## 2021-03-22 ENCOUNTER — Other Ambulatory Visit (HOSPITAL_COMMUNITY): Payer: Self-pay

## 2021-03-22 ENCOUNTER — Telehealth: Payer: Self-pay | Admitting: Internal Medicine

## 2021-03-22 NOTE — Telephone Encounter (Signed)
Called and spoke with patient. He stated that after he had sent the MyChart message yesterday, he decided to go to the ED at [facility]. He was diagnosed with PNA on top of the possible reaction to Ofev. Per patient, he was not given anything for the PNA.  ? ?While on the phone, I went over MR's response about the Ofev reaction.  ? ?I was able to get him scheduled for a video visit tomorrow at 430pm with MR.  ? ?He is aware to call our office if anything gets worse.  ? ?Nothing further needed at time of call.  ?

## 2021-03-23 ENCOUNTER — Telehealth (INDEPENDENT_AMBULATORY_CARE_PROVIDER_SITE_OTHER): Payer: Medicare Other | Admitting: Internal Medicine

## 2021-03-23 ENCOUNTER — Other Ambulatory Visit: Payer: Self-pay

## 2021-03-23 DIAGNOSIS — R768 Other specified abnormal immunological findings in serum: Secondary | ICD-10-CM | POA: Diagnosis not present

## 2021-03-23 DIAGNOSIS — E669 Obesity, unspecified: Secondary | ICD-10-CM | POA: Diagnosis not present

## 2021-03-23 DIAGNOSIS — J9611 Chronic respiratory failure with hypoxia: Secondary | ICD-10-CM | POA: Diagnosis not present

## 2021-03-23 DIAGNOSIS — J849 Interstitial pulmonary disease, unspecified: Secondary | ICD-10-CM | POA: Diagnosis not present

## 2021-03-23 NOTE — Patient Instructions (Addendum)
ILD (interstitial lung disease) ([place]) ?Chronic respiratory failure with hypoxia (HCC) 0-with acute exacerbation ?Pulmonary Air Trapping ?Rheumatoid FActor Positive ? 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 ?Plan ?-- stop gardening ?-Continue to hold nintedanib ?-Support at [facility] recommendation of 2-3 weeks of steroids ?-We will discuss your case mid March 2023 at case conference ?-Keep oxygen levels greater than 88% [you will likely need 2-1/2 L continuous] ?-Keep up with rheumatology appointment  ?-We can look at enrolling you for ILD-Pro registry study at the next visit ?-Future decisions on bronchoscopy and biopsy based on case conference and overall pulmonary stability ? ?Right lower lobe nodule 9 mm -seen on CT October 2022 -> stable January 2023 ? ?Plan ?62-monthCT scan of the chest fr the summer 2023 ? ?Chronic cough -due to  interstitial lung disease  ? ?- on gabapentin for cough by Dr MDuane Boston seems to have helped ?-[facility] ? ?Plan ?- contineu gabapentin but can aim to slowly taper over time and reasess if needed ?-  ? ?Nonrheumatic aortic valve stenosis ? ?Reassured by Dr ALoni Beckwithin 2023 ? ?Plan ? - per DR TElonda Huskyat [place]? ?Obesity - BMI 33.8 ? ?Plan ? - Ultimately significant weight loss is required; please work aggresivel on this for calendar year 2023 ? ?Followup  ? [facility] ? -Will need oxygen evaluation on room air and with walking to see if there is any change in pulmonary status ? -Nurse  practitioner to order pulmonary function test ahead of visit 04/14/2021 ? ?-Keep visit end of March 2023 with Dr. RChase Caller? -30-minute visit but after spirometry and DLCO.  ILD symptom questionnaire and walking desaturation test at follow-up ?

## 2021-03-23 NOTE — Telephone Encounter (Signed)
Received a verbal confirmation from  [employer] regarding an approval for OFEV patient assistance from 02/27/21 to 01/14/22.  Requested fax of approval letter ? ?Phone number: [phone] ? ?Knox Saliva, PharmD, MPH, BCPS ?Clinical Pharmacist (Rheumatology and Pulmonology) ? ?

## 2021-03-23 NOTE — Progress Notes (Addendum)
Cardiology Dr. Loni Beckwith 02/15/20 Dustin Dean is 71 y.o. year old male with a history of No prior cardiovascular disease who presents to [place] cardiology for the evaluation of a heart murmur. This patient is a non-smoker and denies a previous history of myocardial infarction, congestive heart failure, or valvular heart disease. 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 He really has little in the way of exertional chest symptoms. Occasionally according to the wife if he walks up an incline briskly he may have some minor shortness of breath but remains active and has little in the way limitation. He denies a previous history of myocardial infarction, congestive heart failure, or known valvular heart disease. At a recent examination he was found to have a heart murmur and an echocardiogram was performed. It demonstrated a peak gradient across his aortic valve of 30 mmHg and an aortic valve area of 1.1 cm. This was read as being consistent with moderate aortic stenosis. I had a fairly significant discussion with the patient and his wife about the implications of his aortic valve and the need to follow the aortic stenosis progression over time.  11/14/2020 office visit with Dustin Dean pulmonary physician assistant at [facility] has past medical history of restrictive lung disease, interstitial lung disease, allergic rhinitis, laryngeal pharyngeal reflux, arthritis, lung nodules and persistent cough. He is a non-smoker and has never smoked. He has medication allergy to pseudoephedrine.  Dustin Dean presents today with complaint of a progressively worsening shortness of breath and dyspnea on exertion over the past several months. We have been following his interstitial lung disease since 2018. He has been stable without complaint of dyspnea on exertion over the past several years. He indicates that recently he has had more shortness of  breath with exertion. He and his wife will walk the dog in their usual route through the neighborhood that they have done for many years. He reports that he will get short of breath along the root in a way that he has never been bothered with previously. He has recently requested an albuterol inhaler and reports that it has been helpful. He has been using Symbicort 160/4.5 twice daily but has been having difficulty using it successfully as he has been coughing significantly and loses a lot of the medication. A recent chest CT conducted 10/24/2020 notes a progression in the interstitial changes. A 9 mm right lower lobe nodule is also noted. The plan is to have a 62-monthfollow-up chest CT conducted 02/04/2021. A 6-minute walk test conducted today shows oxygen desaturation and need for supplemental oxygen. I will place the order for supplemental oxygen. [facility]   Patient at rest on RA with baseline sats : 97% After exertion for 3 min on RA pt's sats were : 86% Patient placed on oxygen via nasal cannula at 2 lpm flow via portable oxygen concentrator device Exertion for 6 mins with oxygen confirmed final sats at 94%   #1 lung nodules Chest CT 10/24/2020 shows 9 mm right lower lobe lung nodule Follow-up chest CT scheduled for 353-montheevaluation in January 2023  2. Interstitial lung disease Chest CT 11/04/2020 notes fibrotic interstitial lung disease with evidence of progression compared to 08/28/2020 [facility]   #Primary Care dec  2022 Osteopenia Due for DXA. Prostate cancer Saw Dr. Louis Dean at [facility]. Biopsy + for cancer. They are just watching it. He is going to have MRI and another biopsy next visit.   OV 01/24/2021 -evaluation and transfer of care to Dr. Chase Dean in ILD center in [place].  Referred by Dustin Dean patient support group leader for the pulmonary fibrosis foundation support group  Subjective:  Patient ID: Dustin Dean, male , DOB: October 24, 1950 , age 71 y.o. , MRN: [id number] , ADDRESS: [place] [id number] PCP Dustin Boers, MD Patient Care Team: Dustin Boers, MD as PCP - General (Family Medicine)  This Provider for this visit: Treatment Team:  Attending Provider: Brand Males, MD    01/24/2021 -   Chief Complaint  Patient presents with   Consult    Pt is here for a switch of providers to take over care for his pulmonary fibrosis. 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 Pt does become SOB with activities and also states that he does have a chronic cough.     HPI  Dustin Dean 71 y.o. -new evaluation for interstitial lung disease.  History is provided by review of the chart and also talking to the patient and his wife.  He tells me that he has had chronic cough since his teenage years.  But for the last few to several years its been more consistent.  He was seeing the physician assistant at [facility].  Somewhere along the way he got referred to Dr. Dennison Dean right at [facility] some 3 years ago.  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  After that cough is largely improved except for occasional exacerbations in the fall in the winter.  Then in 2018 sometime after he went on gabapentin he was informed by the physician assistant that he might have early ILD.  He does not recollect any serology or biopsy being done.  He does recollect a few pulmonary function test.  Then all of a sudden starting September 2022 his cough "got out of hand" and also developed worsening shortness of breath.  This the first time he started developing worsening shortness of breath.  Then by November 2022 started needing exertional oxygen leading to current symptoms.  He was referred to [facility] but he preferred to establish with the ILD  center here in [place] with [place] based on [facility] recommendation.  He found Dustin Dean through Internet search for pulmonary fibrosis foundation.  His current symptom score is below.  He is not on any antifibrotic.  [place] Integrated Comprehensive ILD Questionnaire  Symptoms:  SYMPTOM SCALE - ILD 01/24/2021  Current weight   O2 use 2L [place] with ex  Shortness of Breath 0 -> 5 scale with 5 being worst (score 6 If unable to do)  At rest 0  Simple tasks - showers, clothes change, eating, shaving 1  Household (dishes, doing bed, laundry) 1  Shopping 2  Walking level at own pace 5  Walking up Stairs 5  Total (30-36) Dyspnea Score 14  How bad is your cough? 2  How bad is your fatigue 1  How bad is nausea 00000  How bad is vomiting?  0  How bad is diarrhea? 0  How bad is anxiety? 0  How bad is depression 0  Any chronic pain - if so where and how bad 0       Past Medical History :  -  He has obesity BMI 33-34 - Denies any collagen vascular disease - He is known to have aortic stenosis -sees cardiologist at [facility].  December 2021 echocardiogram EF 55 to 60% with tricuspid aortic valve and moderate stenosis normal right ventricle -Denies any asthma or COPD.  Denies any heart failure denies any collagen vascular disease -Denies snoring or excessive daytime somnolence.  Denies stroke.  Denies formal diagnosis of sleep apnea denies formal diagnosis of pulmonary hypertension [echo a year ago did not show pulmonary hypertension] -Has had vaccination against COVID but has not had COVID disease -Recent diagnosis of late 2022 early stage prostate cancer at [facility] on observation treatment -Has history of skin cancer for which he apply sunscreen regularly  ROS:  -Shortness of breath present Cough chronic plus -Has intermittent chronic diarrhea and takes Imodium but no other GI side effects -Possible Raynaud's for the last several decades  FAMILY HISTORY of LUNG DISEASE:   Denies family for pulmonary fibrosis.  Denies family Struve COPD or asthma sarcoid or cystic fibrosis.  Denies family history of hypersensitive pneumonitis.  Denies autoimmune disease.  Denies any premature graying of the hair or albinism  PERSONAL EXPOSURE HISTORY:  No prior cigarette smoking -No marijuana use of cocaine use no intravenous drug use.  HOME  EXPOSURE and HOBBY DETAILS :  -Single-family home in a one-story space.  [place] setting age of the current home is 31 years.  He is lived there for 6 years.  He does use a feather pillow and this occasionally mold or mildew in the bathroom.  All his life he is done active gardening with mulch and woodchips and occasionally damp soil.  He is worked out in the yard quite a bit and does gardening.  He enjoys the gardening.  OCCUPATIONAL HISTORY (122 questions) : -He worked in the [employer] at [employer] and then [employer] no [employer].  He does not remember if the buildings had mold in it.  He does not think so -Detail greater than 100 question organic and inorganic antigen history exposure is negative  PULMONARY TOXICITY HISTORY (27 items):  Denies  INVESTIGATIONS: Report of pulmonary function test but I am not able to get it in Care Everywhere     Simple office walk 185 feet x  3 laps goal with forehead probe 01/24/2021    O2 used Ra at rest, desat at 1 lap and then walked with 2L piulse   Number laps completed 1 lap on RA, then 3 laps on 2L   Comments about pace x   Resting Pulse Ox/HR 96% and 88/min at 1 lap   Final Pulse Ox/HR 92% and 115/min on 3 laps on 2L pulsed   Desaturated </= 88% no   Desaturated <= 3% points yes   Got Tachycardic >/= 90/min yes   Symptoms at end of test Modreate dyspnea   Miscellaneous comments Corrected with 2L [place]      High-resolution CT scan of the chest August 2020  -There is a report of honeycombing but without any craniocaudal gradient.  Air-trapping reported  alternative pattern more suggestive of chronic hypersensitive pneumonitis.  But there is already progression from August 2019 -Read by Dr. Vinnie Langton   CT Chest data- HRCT 11/04/20 unable for my visualization.   CLINICAL DATA:  Worsening shortness of breath   EXAM:  CT CHEST WITHOUT CONTRAST   TECHNIQUE:  Multidetector CT imaging of the chest was performed following the  standard protocol  without intravenous contrast. High resolution  imaging of the lungs, as well as inspiratory and expiratory imaging,  was performed.   COMPARISON:  Chest CT dated August 29, 2018   FINDINGS:  Cardiovascular: Normal heart size. No pericardial effusion. Coronary  artery calcifications of the circumflex. Aortic valve  calcifications. Minimal calcified plaque of the thoracic aorta.   Mediastinum/Nodes: Mildly enlarged mediastinal lymph nodes,  unchanged compared to prior exam and likely reactive. Esophagus and  thyroid are unremarkable.   Lungs/Pleura: Central airways are patent. Bilateral air trapping.  Peribronchovascular and subpleural reticular opacities with traction  bronchiectasis and no clear craniocaudal predominance. Honeycomb  change primarily seen in the anterior upper lobes is increased when  compared to prior exam. New linear nodular opacity of the right  lower lobe measuring up to 9 mm on series 4, image 210.   Upper Abdomen: No acute abnormality.   Musculoskeletal: No chest wall mass or suspicious bone lesions  identified.   IMPRESSION:  Fibrotic interstitial lung disease with evidence of progression when  compared with August 29, 2018 prior exam. Favor fibrotic  hypersensitivity pneumonitis given presence of air trapping.  Findings are suggestive of an alternative diagnosis (not UIP) per  consensus guidelines: Diagnosis of Idiopathic Pulmonary Fibrosis: An  Official ATS/ERS/JRS/ALAT Clinical Practice Guideline. [place], Iss 5, [phone], Sep 15 2016.   New linear nodular opacity of the right lower lobe measuring up to 9  mm, likely an area of focal fibrosis. Recommend follow-up chest CT  in 3 months to ensure stability.   Aortic Atherosclerosis (ICD10-I70.0).   Addendum 01/26/2021 -radiology Dr. Weber Cooks wrote back saying CT scan is classic for chronic HP.  We will call the patient and get the patient started on nintedanib counseling which is first-line for progressive non-- IPF ILD.  If he is reluctant then we will do pirfenidone.  We will set up pharmacy counseling.  If he is reluctant for nintedanib based on risk versus benefit we will do pirfenidone  Electronically Signed    By: Yetta Glassman M.D.    On: 11/04/2020 14:17  No results found.   Phone visit 01/28/21  Emilyh  LEt Dustin Dean know ahead of the visit followup with me on 02/23/21   A) making referral to [facility] to discuss ofev which would be first line baed on what radiologist told me about CT and fact rheumaotid facot is positive  B) rheumatoid factor strongly positive - refer rheumatology Dr Deveshwar/Rice  C) I reviewed echo report (below) Will discuss at followup  Aortic Valve: There is moderate stenosis, with peak and mean gradients  of 44.000 and 27.000 mmHg.  Aortic valve area calculates to approximately  1.4 cm    Mitral Valve: There is mild regurgitation.    Tricuspid Valve: There is mild regurgitation.    Tricuspid Valve: The right ventricular systolic pressure is normal (<36  mmHg).   1.  Adequate 2D M-mode and color flow Doppler echocardiogram demonstrates  normal left ventricular chamber size and contractility.  There may be mild  left ventricular hypertrophy.  Ejection fraction is estimated 65%  2.  The aortic valve is thickened and demonstrates reduced leaflet  mobility.  There is mild to moderate aortic stenosis with aortic valve  area calculating to 1.4 cm  3.  Mild mitral annular calcification is noted but there is good leaflet   mobility.  Mild mitral insufficiency is noted  4.  Tricuspid valve appears to  be normal  5.  The atria are grossly normal size bilaterally  6.  The right ventricular chamber size and function is normal  6.  The pericardium is of normal thickness there is no effusion  OV 02/23/2021  Subjective:  Patient ID: Dustin Dean, male , DOB: 1950/05/17 , age 66 y.o. , MRN: [id number] , ADDRESS: [place] [id number] PCP Dustin Boers, MD Patient Care Team: Dustin Boers, MD as PCP - General (Family Medicine)  This Provider for this visit: Treatment Team:  Attending Provider: Brand Males, MD    02/23/2021 -   Chief Complaint  Patient presents with   Follow-up    Pt recently had a HRCT and PFT and is here today to discuss the results.   Chronic cough on gabapentin  Interstitial lung disease work-up in progress -concern for hypersensitive pneumonitis  -Progressive phenotype between August 2020 and October 2022  9 mm right lower lobe nodule seen in October 2022 for the first time: Stable January 2023  HPI Dustin Dean 71 y.o. -since his last visit Dr. Weber Cooks the radiologist did look at the CT scan and this confirmed his CT is not consistent with UIP but more likely an alternate pattern consistent with hypersensitive pneumonitis as seen by air trapping.  Patient has had serology work-up in which it is all negative except rheumatoid factor strongly positive.  He is yet to see the rheumatologist.  He does have significant restrictions on the PFTs with reduced DLCO consistent with his obesity and ILD.  His symptoms are overall stable.  Did go through his exposure history again.  He says in 200 07/2006 and again in 2012 he had had 3 episodes of pneumonia.  All of this happened when he entered school buildings.  He was living in the same house at that time but the house itself was Dustin-new and there is no mold or mildew.  He is wondering about crawlspace because only [place] he has seen crawlspace but he said he has never been in the crawl space.  He does not know if there was mold in the crawl space.  I told him it is doubtful that mold or mildew from the crawlspace can get into the house.  He did have a feather pillow but since his last visit he is thrown that out.  He does do gardening and does get exposed to dampness.  At this point in time did discuss with him that he does have ILD and it is progressive.  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  Unclear if the positive rheumatoid factor is playing a role it could be if he has positive for rheumatoid arthritis.  Did indicate that ultimately only a biopsy can put to rest if he has IPF [being Caucasian gentleman greater than 65 is classic phenotype for IPF] but did indicate to him that given progression does not matter whether it is IPF or non-IPF antifibrotic's is indicated.  He is already met with the pharmacist and discussed the 2 antifibrotic's.  Did indicate to him that nintedanib is first-line and non-- IPF progressive phenotype.  Did discuss about the diarrhea.  Denies any contraindication.  Therefore we will start this.  Did discuss whether diarrhea is willing to go through this.  Also discussed about ILD-Pro registry.  He is interested given the consent form.  Did indicate to him that it probably would be beneficial to do some kind of a limited work-up in differentiating his  etiology.  The reasons would be to participate in clinical trials but also in case he needs immunosuppression such as prednisone or CellCept [indicated for hypersensitive pneumonitis but not for IPF] having a specific diagnosis would be beneficial.  I did think that his obesity puts him at some risk for getting surgical lung biopsy and therefore it might be better to start off with bronchoscopy with lavage and transbronchial biopsies.  We can address this in the future.  This visit was just focused on  therapies.  Regarding his 9 mm lower lobe nodule: He had a CT scan of the chest and shows this is stable from October 2022 through January 2023.  The CT scan again reads as alternative pattern consistent with chronic HP.    CT Chest data January 2023  No results found.  Mediastinum/Nodes: Multiple prominent borderline but nonenlarged mediastinal and bilateral hilar lymph nodes, similar to the prior study esophagus is unremarkable in appearance. No axillary lymphadenopathy.   Lungs/Pleura: High-resolution images again demonstrate widespread but patchy areas of ground-glass attenuation, septal thickening, subpleural reticulation, thickening of the peribronchovascular interstitium, cylindrical and varicose traction bronchiectasis, peripheral bronchiolectasis and extensive honeycombing. There is no discernible craniocaudal gradient. The most extensive areas of honeycombing are anterior lung, predominantly in the upper lobes and right middle lobe. Some areas of the lung bases demonstrate relative sparing. Inspiratory and expiratory imaging demonstrates mild to moderate air trapping indicative of small airways disease. No definite progression compared to the recent prior study. No acute consolidative airspace disease. No pleural effusions. Previously described nodular area of architectural distortion in the right lower lobe (axial image 106 of series 8) is stable measuring 8 mm on today's examination, likely part of the underlying fibrosis. No other definite suspicious appearing pulmonary nodules or masses are noted.   Upper Abdomen: Unremarkable.   Musculoskeletal: There are no aggressive appearing lytic or blastic lesions noted in the visualized portions of the skeleton.   IMPRESSION: 1. 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 2. Previously noted nodular area of architectural distortion in the right lower lobe is unchanged, likely part of the fibrosis rather than a pulmonary nodule. 3. There are calcifications of the aortic valve and mitral annulus. Echocardiographic correlation for evaluation of potential valvular dysfunction may be warranted if clinically indicated. 4. Aortic atherosclerosis, in addition to 2 vessel coronary artery disease. Please note that although the presence of coronary artery calcium documents the presence of coronary artery disease, the severity of this disease and any potential stenosis cannot be assessed on this non-gated CT examination. Assessment for potential risk factor modification, dietary therapy or pharmacologic therapy may be warranted, if clinically indicated.   Aortic Atherosclerosis (ICD10-I70.0).     Electronically Signed   By: Vinnie Langton M.D.   On: 02/14/2021 06:17 stable January 2023  PFT  PFT Results Latest Ref Rng & Units [facility]21[facility] - results NA Feb 2022 possibly the 15th, [facility] 02/25/19 at [place] [facility] from chart eview  FVC-Pre L 1.82     FVC-Predicted Pre % 43  38% 55%  FVC-Post L 1.95     FVC-Predicted Post % 46     Pre FEV1/FVC % % 88  81 87  Post FEV1/FCV % % 91  FEV1-Pre L 1.60     FEV1-Predicted Pre % 51  40% 61%  FEV1-Post L 1.76     DLCO uncorrected ml/min/mmHg 15.57     DLCO UNC% % 63   87%  DLCO corrected ml/min/mmHg 15.57     DLCO COR %Predicted % 63     DLVA Predicted % 106     TLC L 3.76     TLC % Predicted % 56     RV % Predicted % 75        Latest Reference Range & Units 01/27/21 15:53 01/27/21 15:54  Anti Nuclear Antibody (ANA) NEGATIVE   NEGATIVE  Angiotensin-Converting Enzyme 9 - 67 U/L  49  Anti JO-1 0.0 - 0.9 AI  <1.6  Cyclic Citrullin  Peptide Ab UNITS  <16  ds DNA Ab IU/mL  1  ENA RNP Ab 0.0 - 0.9 AI  0.4  RA Latex Turbid. <14 IU/mL  323 (H)  SSA (Ro) (ENA) Antibody, IgG <1.0 NEG AI  <1.0 NEG  SSB (La) (ENA) Antibody, IgG <1.0 NEG AI  <1.0 NEG  Scleroderma (Scl-70) (ENA) Antibody, IgG <1.0 NEG AI  <1.0 NEG  QUANTIFERON-TB GOLD PLUS   Rpt  A.Fumigatus #1 Abs Negative  Negative   Micropolyspora faeni, IgG Negative  Negative   Thermoactinomyces vulgaris, IgG Negative  Negative   A. Pullulans Abs Negative  Negative   Thermoact. Saccharii Negative  Negative   Pigeon Serum Abs Negative  Negative   (H): Data is abnormally high     OV 03/23/2021- acute video visit due to concerns for acute symptoms ? Ofev side effect  Subjective:  Patient ID: Dustin Dean, male , DOB: [birth year] , age 89 y.o. , MRN: [id number] , ADDRESS: [place] [id number] PCP Dustin Boers, MD Patient Care Team: Dustin Boers, MD as PCP - General (Family Medicine)  This Provider for this visit: Treatment Team:  Attending Provider: Brand Males, MD  Type of visit: Video Circumstance: COVID-19 national emergency Identification of patient Dustin Dean with 1950-10-07 and MRN [id number] - 2 person identifier Risks: Risks, benefits, limitations of telephone visit explained. Patient understood and verbalized agreement to proceed Anyone else on call: just patient Patient location: Inpatient bed at [facility] which is another health system This provider location: [place]. pulmonary office [address]  [place],  [id number].   03/23/2021 -in this video visit was set up on acute basis because he started having symptoms after starting nintedanib.  He wanted to know if it was nintedanib symptoms.  The symptoms sound rather complex and little bit out of proportion for nintedanib.  Therefore I scheduled this video visit and to my surprise he is sitting in a hospital bed at [facility]  hospital.    HPI Dustin Dean 71 y.o. - started ofev mid-feb 2023. Started noticing feeling disoriented and at times weak. Then on 3/4-03/19/21 was feeling very weak whole bidy weakness and needed walker to even go across room. Tired. Stopped going to gym. Was using o2 all the time. Then on 03/21/21 feelign cold. ANd ahd fever  102F.  Does not remember Tuesday much. Initialyt Dx is pneumonia and testing/imaging -> by tthen pccm saw him Dr Dan Europe. Procal negative. WBC normal. Concerns is ILD flare up and recommendation is prednisone.  CT scan did not show pneumonia Curently on IV solumedrol. Currently some better. Yesterday ate well. Currently on 2.5LN . He has been covid negative. Multiplex resp virus panel negative  per outside chart revie  With ofev was having some nausea. No abd pain. No diarrhea. No stomach cramps. No vomitting   Currently ofev on hold     SYMPTOM SCALE - ILD 01/24/2021 02/23/2021   Current weight    O2 use 2L [place] with ex 2L with ex  Shortness of Breath 0 -> 5 scale with 5 being worst (score 6 If unable to do)   At rest 0 0  Simple tasks - showers, clothes change, eating, shaving 1 1  Household (dishes, doing bed, laundry) 1 1  Shopping 2 1  Walking level at own pace 5 2  Walking up Stairs 5 3  Total (30-36) Dyspnea Score 14 8  How bad is your cough? 2 3  How bad is your fatigue 1 2  How bad is nausea 00000 0  How bad is vomiting?  0 0  How bad is diarrhea? 0 0  How bad is anxiety? 0 0  How bad is depression 0 0  Any chronic pain - if so where and how bad 0 0     PFT  PFT Results Latest Ref Rng & Units 02/21/2021  FVC-Pre L 1.82  FVC-Predicted Pre % 43  FVC-Post L 1.95  FVC-Predicted Post % 46  Pre FEV1/FVC % % 88  Post FEV1/FCV % % 91  FEV1-Pre L 1.60  FEV1-Predicted Pre % 51  FEV1-Post L 1.76  DLCO uncorrected ml/min/mmHg 15.57  DLCO UNC% % 63  DLCO corrected ml/min/mmHg 15.57  DLCO COR %Predicted % 63  DLVA Predicted % 106  TLC L 3.76  TLC %  Predicted % 56  RV % Predicted % 75       has a past medical history of Flu, Pneumonia, and Skin cancer.   reports that he has never smoked. He has never used smokeless tobacco.  Past Surgical History:  Procedure Laterality Date   HERNIA REPAIR     SKIN CANCER EXCISION      Allergies  Allergen Reactions   Bee Venom Rash   Shellfish Allergy Hives   Pseudoephedrine Anxiety    Immunization History  Administered Date(s) Administered   Fluad Quad(high Dose 65+) 10/23/2017, 09/30/2019, 10/18/2020   Influenza-Unspecified 11/11/2009, 11/08/2011, 11/03/2013, 11/22/2014, 11/10/2015   Moderna Covid-19 Vaccine Bivalent Booster 37yrs & up 10/24/2020   Moderna Sars-Covid-2 Vaccination 03/19/2019, 04/16/2019, 11/16/2019, 05/07/2020   Pneumococcal Conjugate-13 12/08/2007, 12/21/2015, 09/04/2018   Pneumococcal Polysaccharide-23 12/08/2007, 06/21/2017, 06/16/2018   Tdap 12/08/2007, 11/27/2018   Zoster Recombinat (Shingrix) 10/30/2013, 09/04/2018, 11/27/2018   Zoster, Live 10/30/2013   Zoster, Unspecified 10/30/2013    No family history on file.   Current Outpatient Medications:    acetaminophen (TYLENOL) 325 MG tablet, Take by mouth., Disp: , Rfl:    albuterol (VENTOLIN HFA) 108 (90 Base) MCG/ACT inhaler, Inhale into the lungs., Disp: , Rfl:    aspirin-acetaminophen-caffeine (EXCEDRIN MIGRAINE) 250-250-65 MG tablet, Take by mouth., Disp: , Rfl:    budesonide-formoterol (SYMBICORT) 160-4.5 MCG/ACT inhaler, Inhale into the lungs., Disp: , Rfl:    Calcium Carb-Cholecalciferol (CALCIUM/VITAMIN D PO), Take 1 tablet by mouth daily., Disp: , Rfl:    Cetirizine HCl (ZYRTEC PO), Take by mouth., Disp: , Rfl:    fluorouracil (EFUDEX) 5 % cream, Apply topically., Disp: , Rfl:    fluticasone (FLONASE) 50 MCG/ACT nasal spray, Place into both nostrils daily., Disp: , Rfl:    gabapentin (NEURONTIN) 300 MG capsule, Take by mouth., Disp: , Rfl:    ibuprofen (ADVIL) 200 MG tablet, Take  by mouth., Disp:  , Rfl:    loperamide (IMODIUM) 2 MG capsule, Take by mouth., Disp: , Rfl:    meclizine (ANTIVERT) 25 MG tablet, , Disp: , Rfl:    Multiple Vitamin (MULTIVITAMIN) capsule, Take 1 capsule by mouth every morning., Disp: , Rfl:    naproxen sodium (ALEVE) 220 MG tablet, Take by mouth., Disp: , Rfl:    omeprazole (PRILOSEC) 40 MG capsule, Take 40 mg by mouth daily., Disp: , Rfl:    PAROXETINE HCL PO, Take by mouth., Disp: , Rfl:    rosuvastatin (CRESTOR) 5 MG tablet, Take 1 tablet by mouth daily., Disp: , Rfl:    SUMAtriptan (IMITREX) 100 MG tablet, TAKE ONE TABLET BY MOUTH AT ONSET OF HEADACHE; MAY REPEAT ONE TABLET IN 2 HOURS IF NEEDED. MAX OF 200 MG PER DAY, Disp: , Rfl:       Objective:   There were no vitals filed for this visit.  Estimated body mass index is 34.21 kg/m as calculated from the following:   Height as of 02/23/21: $RemoveBe'5\' 7"'RNXVQGuVC$  (1.702 m).   Weight as of 02/23/21: 218 lb 6.4 oz (99.1 kg).  $Rem'@WEIGHTCHANGE'PnGo$ @  There were no vitals filed for this visit.   Physical Exam    General: No distress.  He is in a hospital gown sitting in a hospital bed with oxygen on. Neuro: Alert and Oriented x 3. GCS 15. Speech normal Psych: Pleasant He does have some laryngeal cough.       Assessment:     No diagnosis found.     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  - Unclear if positive rheumatoid factor is playing a role - Glad you got rid of feather pillow -Currently very likely acute exacerbation of unclear cause being treated with prednisone -Nintedanib started mid February 2023 is on hold following the acute admission  Plan -- stop gardening -Continue to hold nintedanib -Support  at [facility] recommendation of 2-3 weeks of steroids -We will discuss your case mid March 2023 at case conference -Keep oxygen levels greater than 88% [you will likely need 2-1/2 L continuous] -Keep up with rheumatology appointment  -We can look at enrolling you for ILD-Pro registry study at the next visit -Future decisions on bronchoscopy and biopsy based on case conference and overall pulmonary stability  Right lower lobe nodule 9 mm -seen on CT October 2022 -> stable January 2023  Plan 27-month CT scan of the chest fr the summer 2023  Chronic cough -due to  interstitial lung disease   - on gabapentin for cough by Dr Duane Boston; seems to have helped -You did see Dr. Ernestine Conrad at [facility] on 03/20/2021  Plan - contineu gabapentin but can aim to slowly taper over time and reasess if needed -   Nonrheumatic aortic valve stenosis  Reassured by Dr Loni Beckwith in 2023  Plan  - per DR Elonda Husky at [place] - BMI 33.8  Plan  - Ultimately significant weight loss is required; please work aggresivel on this for calendar year [facility] visit mid March 2023 with nurse practitioner  -Will need oxygen evaluation on room air and with walking to see if there is  any change in pulmonary status  -Nurse practitioner to order pulmonary function test ahead of visit 04/14/2021  -Keep visit end of March 2023 with Dr. Chase Dean  -30-minute visit but after spirometry and DLCO.  ILD symptom questionnaire and walking desaturation test at follow-up  ( Level 05 visit: Estb 40-54 min  visit type: VIDEO visit  in total care time and counseling or/and coordination of care by this undersigned MD - Dr Dustin Dean. This includes one or more of the following on this same day 03/23/2021: pre-charting, chart review, note writing, documentation discussion of test results, diagnostic or treatment recommendations, prognosis, risks and benefits of management  options, instructions, education, compliance or risk-factor reduction. It excludes time spent by the [place] or office staff in the care of the patient. Actual time 30 min)   SIGNATURE    Dr. Brand Dean, M.D., F.C.C.P,  Pulmonary and Critical Care Medicine Staff Physician, [place] Director - Interstitial Lung Disease  Program  Pulmonary [place] at [place], [place], [address]  Pager: [phone], If no answer or between  15:00h - 7:00h: call [phone] Telephone: [phone]  5:37 PM 03/23/2021

## 2021-03-28 ENCOUNTER — Other Ambulatory Visit: Payer: Self-pay

## 2021-03-28 ENCOUNTER — Ambulatory Visit (INDEPENDENT_AMBULATORY_CARE_PROVIDER_SITE_OTHER): Payer: Medicare Other | Admitting: Adult Health

## 2021-03-28 ENCOUNTER — Encounter: Payer: Self-pay | Admitting: Internal Medicine

## 2021-03-28 ENCOUNTER — Encounter: Payer: Self-pay | Admitting: Adult Health

## 2021-03-28 DIAGNOSIS — J849 Interstitial pulmonary disease, unspecified: Secondary | ICD-10-CM | POA: Diagnosis not present

## 2021-03-28 DIAGNOSIS — J9611 Chronic respiratory failure with hypoxia: Secondary | ICD-10-CM | POA: Diagnosis not present

## 2021-03-28 MED ORDER — BENZONATATE 200 MG PO CAPS: 200.0000 mg | ORAL_CAPSULE | Freq: Three times a day (TID) | ORAL | 3 refills | Status: DC | PRN

## 2021-03-28 NOTE — Patient Instructions (Addendum)
Finish prednisone taper as directed.  ?Continue on Oxygen 2l/m with activity and At bedtime  . Goal is for O2 sats >88-90%.  ?Activity as tolerated.  ?Delsym 2 tsp Twice daily  for cough as needed  ?Tessalon Three times a day  for cough as needed  ?Sips of water to help cough  ?Remain off [place] for now.  ?Follow up with Dr. Chase Caller in 2 weeks as planned  ?Please contact office for sooner follow up if symptoms do not improve or worsen or seek emergency care  ? ? ? ?

## 2021-03-28 NOTE — Progress Notes (Signed)
? ?'@Patient'$  ID: Dustin Dean, male    DOB: June 27, 1950, 71 y.o.   MRN: [id number] ? ?Chief Complaint  ?Patient presents with  ? Hospitalization Follow-up  ? ? ?Referring provider: ?Joneen Boers, MD ? ?HPI: ?71 year old male followed for ILD  (diagnosed in February 2018) ?Seen for pulmonary consult January 24, 2021 ?Medical history significant for aortic valve stenosis followed by cardiology at [facility], history of prostate cancer ? ?TEST/EVENTS :  ?December 2021 echocardiogram EF 55 to 60% with tricuspid aortic valve and moderate stenosis normal right ventricle ? ?  ?High-resolution CT scan of the chest August 2020 ? -There is a report of honeycombing but without any craniocaudal gradient.  Air-trapping reported alternative pattern more suggestive of chronic hypersensitive pneumonitis.  But there is already progression from August 2019 ? ?Fibrotic interstitial lung disease with evidence of progression when  ?compared with August 29, 2018 prior exam. Favor fibrotic  ?hypersensitivity pneumonitis given presence of air trapping.  ?Findings are suggestive of an alternative diagnosis (not UIP ? ?  ?9 mm right lower lobe nodule seen in October 2022 for the first time: Stable January 2023 ? ?CT Chest data January 2023 ?   ?Mediastinum/Nodes: Multiple prominent borderline but nonenlarged ?mediastinal and bilateral hilar lymph nodes, similar to the prior ?study esophagus is unremarkable in appearance. No axillary ?lymphadenopathy. ?  ?Lungs/Pleura: High-resolution images again demonstrate widespread ?but patchy areas of ground-glass attenuation, septal thickening, ?subpleural reticulation, thickening of the peribronchovascular ?interstitium, cylindrical and varicose traction bronchiectasis, ?peripheral bronchiolectasis and extensive honeycombing. There is no ?discernible craniocaudal gradient. The most extensive areas of ?honeycombing are anterior lung, predominantly in the upper lobes and ?right middle lobe. Some areas  of the lung bases demonstrate relative ?sparing. Inspiratory and expiratory imaging demonstrates mild to ?moderate air trapping indicative of small airways disease. No ?definite progression compared to the recent prior study. No acute ?consolidative airspace disease. No pleural effusions. Previously ?described nodular area of architectural distortion in the right ?lower lobe (axial image 106 of series 8) is stable measuring 8 mm on ?today's examination, likely part of the underlying fibrosis. No ?other definite suspicious appearing pulmonary nodules or masses are ?noted. ?  ?Upper Abdomen: Unremarkable. ?  ?Musculoskeletal: There are no aggressive appearing lytic or blastic ?lesions noted in the visualized portions of the skeleton. ?  ?IMPRESSION: ?1. 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 ?2. Previously noted nodular area of architectural distortion in the ?right lower lobe is unchanged, likely part of the fibrosis rather ?than a pulmonary nodule. ?3. There are calcifications of the aortic valve and mitral annulus. ?Echocardiographic correlation for evaluation of potential valvular ?dysfunction may be warranted if clinically indicated. ?4. Aortic atherosclerosis, in addition to 2 vessel coronary artery ?disease. Please note that although the presence of coronary artery ?calcium documents the presence of coronary artery disease, the ?severity of this disease and any potential stenosis cannot be ?assessed on this non-gated CT examination. Assessment for potential ?risk factor modification, dietary therapy or pharmacologic therapy ?may be warranted, if clinically indicated. ?  ?Aortic Atherosclerosis (ICD10-I70.0). ? ?03/28/2021 Follow  up : ILD , Post hospital follow up  ?Patient returns for a 1 week follow-up visit.  Patient was seen in January 2023 for second opinion for interstitial lung disease.  Patient was felt to have possible concern  for hypersensitivity pneumonitis.  He has had a progressive phenotype noted on CT scan from August 2020 to October 2022.  Serology was negative except for a strongly positive rheumatoid factor.  He was referred to rheumatology.  PFT showed significant restriction and reduced DLCO.  Has a history of recurrent pneumonia.  He was recommended to begin antifibrotic's. With OFEV.  ?Patient was hospitalized last week with severe dyspnea, weakness, activity intolerance to point he could not walk. Confusion, low oxygen levels, and fever-tmax 102. Kristeen Miss he has a viral illness and ILD flare.  Patient was hospitalized at an outlying hospital.  He was treated with steroids.  Discharged on a steroid taper. CT chest 03/22/21 report showed fibrotic ILD, stable since 10/2020, no acute consolidation.  Covid , Influezna  and Viral panel were neg.  ?Since getting out of the hospital patient is feeling much better. Energy level has picked, decreased oxygen demands, no fever. Appetite is some better . Patient is concerned this could have been from [facility] as his symptoms started when he started OFEV.  ?Is on Oxygen with activity and At bedtime  2l/m . At rest O2 sats are >90%.  ? ? ?Allergies  ?Allergen Reactions  ? Bee Venom Rash  ? Shellfish Allergy Hives  ? Pseudoephedrine Anxiety  ? ? ?Immunization History  ?Administered Date(s) Administered  ? Fluad Quad(high Dose 65+) 10/23/2017, 09/30/2019, 10/18/2020  ? Influenza-Unspecified 11/11/2009, 11/08/2011, 11/03/2013, 11/22/2014, 11/10/2015  ? Moderna Covid-19 Vaccine Bivalent Booster 36yr & up 10/24/2020  ? Moderna Sars-Covid-2 Vaccination 03/19/2019, 04/16/2019, 11/16/2019, 05/07/2020  ? Pneumococcal Conjugate-13 12/08/2007, 12/21/2015, 09/04/2018  ? Pneumococcal Polysaccharide-23  12/08/2007, 06/21/2017, 06/16/2018  ? Tdap 12/08/2007, 11/27/2018  ? Zoster Recombinat (Shingrix) 10/30/2013, 09/04/2018, 11/27/2018  ? Zoster, Live 10/30/2013  ? Zoster, Unspecified 10/30/2013  ? ? ?Past Medical History:  ?Diagnosis Date  ? Flu   ? Pneumonia   ? Skin cancer   ? ? ?Tobacco History: ?Social History  ? ?Tobacco Use  ?Smoking Status Never  ?Smokeless Tobacco Never  ? ?Counseling given: Not Answered ? ? ?Outpatient Medications Prior to Visit  ?Medication Sig Dispense Refill  ? acetaminophen (TYLENOL) 325 MG tablet Take by mouth.    ? albuterol (VENTOLIN HFA) 108 (90 Base) MCG/ACT inhaler Inhale into the lungs.    ? aspirin-acetaminophen-caffeine (EXCEDRIN MIGRAINE) 250-250-65 MG tablet Take by mouth.    ? Calcium Carb-Cholecalciferol (CALCIUM/VITAMIN D PO) Take 1 tablet by mouth daily.    ? Cetirizine HCl (ZYRTEC PO) Take by mouth.    ? fluorouracil (EFUDEX) 5 % cream Apply topically.    ? fluticasone (FLONASE) 50 MCG/ACT nasal spray Place into both nostrils daily.    ? gabapentin (NEURONTIN) 300 MG capsule Take 300 mg by mouth 3 (three) times daily.    ? ibuprofen (ADVIL) 200 MG tablet Take 200 mg by mouth every 4 (four) hours as needed.    ? loperamide (IMODIUM) 2 MG capsule Take by mouth.    ? meclizine (ANTIVERT) 25 MG tablet Take 25 mg by mouth 2 (two) times daily as needed.    ? Multiple Vitamin (MULTIVITAMIN) capsule Take 1 capsule by mouth every morning.    ? naproxen sodium (ALEVE) 220 MG tablet Take 220 mg by mouth daily as needed.    ? omeprazole (PRILOSEC) 40 MG capsule Take 40 mg by mouth daily.    ? PAROXETINE HCL PO Take by mouth.    ? rosuvastatin (CRESTOR) 5 MG tablet Take 1 tablet by mouth daily.    ?  SUMAtriptan (IMITREX) 100 MG tablet TAKE ONE TABLET BY MOUTH AT ONSET OF HEADACHE; MAY REPEAT ONE TABLET IN 2 HOURS IF NEEDED. MAX OF 200 MG PER DAY    ? budesonide-formoterol (SYMBICORT) 160-4.5 MCG/ACT inhaler Inhale into the lungs.    ? ?No facility-administered medications prior to  visit.  ? ? ? ?Review of Systems:  ? ?Constitutional:   No  weight loss, night sweats,  Fevers, chills,  ?+fatigue, or  lassitude. ? ?HEENT:   No headaches,  Difficulty swallowing,  Tooth/dental problem

## 2021-03-28 NOTE — Assessment & Plan Note (Signed)
Recent flare questionable etiology.  Possible viral illness with fever however patient's COVID-19 and influenza and viral panel were all negative.  Symptoms did start shortly after beginning Ofev which would be a atypical adverse effect of Ofev.  ?Patient is clinically improved with steroids.  ?For now remain off of Ofev.  Complete steroid taper.  Follow back up here in 2 weeks.  Activity as tolerated. ? ?Plan  ?Patient Instructions  ?Finish prednisone taper as directed.  ?Continue on Oxygen 2l/m with activity and At bedtime  . Goal is for O2 sats >88-90%.  ?Activity as tolerated.  ?Delsym 2 tsp Twice daily  for cough as needed  ?Tessalon Three times a day  for cough as needed  ?Sips of water to help cough  ?Remain off [place] for now.  ?Follow up with Dr. Chase Caller in 2 weeks as planned  ?Please contact office for sooner follow up if symptoms do not improve or worsen or seek emergency care  ? ? ? ?  ? ?

## 2021-03-28 NOTE — Assessment & Plan Note (Signed)
Continue on oxygen to maintain O2 saturations greater than 88 to 90%. 

## 2021-04-03 NOTE — Progress Notes (Signed)
? ?Interstitial Lung Disease Multidisciplinary Conference ?  ?Dustin Dean    MRN [id number]    DOB 1950/10/23 ? ?Primary Care Physician:Boals, Marjory Lies, MD ? ?Referring Physician: Chase Caller ? ?Time of Conference: 7.30am- 8.30am ?Date of conference: 03/28/21 ?Location of Conference: -  Virtual ? ?Participating ?Pulmonary: Dr. Brand Males, MD - yes,  Dr Marshell Garfinkel, MD - yes, Dr Charm Barges ?Pathology: Dr Jaquita Folds, MD - yes , Dr Enid Cutter - no ?Radiology: Dr Yetta Glassman ? ?Brief History: "CT bieng consistently read as HP. His RF is 300 and positive. He has progressed.  Only exposure is gardening and feather pillow.  ?Question: how confident is radiology of dx of HP? Doe we need bronch biopsy?" Now hospitlized with recent flare up ? ?Serology: RF 300 ? ?MDD discussion of CT scan   ? ?- Date or time period of scan: HRCT: 02/13/2021 ? ?- Features mentioned:  ?- Defnitely more UL . HC + esp anterior UL. Very patchy. Perbronchoscvasular. Air Trapping +. DDx is HP and CTD Is ILD - Anterior UL sign ? ?- What is the final conclusion per 2018 ATS/Fleischner Criteria - alternative to UIP. Concordant read ? ?Pathology discussion of biopsy no: ? ?PFTs:  ?PFT Results Latest Ref Rng & Units 02/21/2021  ?FVC-Pre L 1.82  ?FVC-Predicted Pre % 43  ?FVC-Post L 1.95  ?FVC-Predicted Post % 46  ?Pre FEV1/FVC % % 88  ?Post FEV1/FCV % % 91  ?FEV1-Pre L 1.60  ?FEV1-Predicted Pre % 51  ?FEV1-Post L 1.76  ?DLCO uncorrected ml/min/mmHg 15.57  ?DLCO UNC% % 63  ?DLCO corrected ml/min/mmHg 15.57  ?DLCO COR %Predicted % 63  ?DLVA Predicted % 106  ?TLC L 3.76  ?TLC % Predicted % 56  ?RV % Predicted % 75  ? ? ? ? ?Labs: x ? ?MDD Impression/Recs: Recent flare up. Bx can be prohibitive. get rheum eval and if neg - manage as HP. Maybe at the most BAL if stable ? ? ?Time Spent in preparation and discussion:  > 30 min ? ? ? ?SIGNATURE  ? ?Dr. Brand Males, M.D., F.C.C.P,  ?Pulmonary and Critical Care Medicine ?Staff Physician, [place] ?Center Director - Interstitial Lung Disease  Program  ?Pulmonary [place] [facility] [place] [facility] ?[place], [place], [address] ? ?Pager: [phone], If no answer or between  15:00h - 7:00h: call [phone] ?Telephone: [phone] ? ?10:29 AM ?04/03/2021 ?.................................................................................................................... ?References: ?Diagnosis of Hypersensitivity Pneumonitis in Adults. An Official ATS/JRS/ALAT Clinical Practice Guideline. ?Ragu G et al, [place] Aug 1;202(3):e36-e69. ? ? ? ? ? ? ?Diagnosis of Idiopathic Pulmonary Fibrosis. An Official ATS/ERS/JRS/ALAT Clinical Practice Guideline. ?Raghu G et al, [place] 2018 Sep 1;198(5):e44-e68. ? ? ?IPF Suspected   Histopath ology Pattern   ?   ?UIP  ?Probable UIP  ?Indeterminate for  ?UIP  ?Alternative  ?diagnosis  ?  ?UIP  ?IPF  ?IPF  ?IPF  ?Non-IPF dx  ? ?HRCT   ?Probabe UIP  ?IPF  ?IPF  ?IPF (Likely)**  ?Non-IPF dx  ?Pattern  ?Indeterminate for UIP  ?IPF  ?IPF (Likely)**  ?Indeterminate  ?for IPF**  ?Non-IPF dx  ?  ?Alternative diagnosis  ?IPF (Likely)**/ non-IPF dx  ?Non-IPF dx  ?Non-IPF dx  ?Non-IPF dx  ? ? ? ?Idiopathic pulmonary fibrosis diagnosis based upon HRCT and Biopsy paterns. ? ?** IPF is the likely diagnosis when any of following features are  present: ? ?Moderate-to-severe traction bronchiectasis/bronchiolectasis (defined as mild traction bronchiectasis/bronchiolectasis in four or more lobes including the lingual as a lobe, or moderate to severe traction bronchiectasis in two or more lobes) in a man over age 62 years or in a woman over age 20 years ?Extensive (>30%) reticulation on HRCT and an age >70 years  ?Increased neutrophils and/or absence of lymphocytosis in BAL fluid  ?Multidisciplinary discussion reaches a confident diagnosis of IPF.  ? ?**Indeterminate for IPF ? ?Without an adequate biopsy is unlikely to  be IPF  ?With an adequate biopsy may be reclassified to a more specific diagnosis after multidisciplinary discussion and/or additional consultation.  ? ?dx = diagnosis; HRCT = high-resolution computed tomography; IPF = idiopathic pulmonary fibrosis; UIP = usual interstitial pneumonia. ? ? ?

## 2021-04-05 ENCOUNTER — Encounter: Payer: Self-pay | Admitting: Internal Medicine

## 2021-04-05 ENCOUNTER — Ambulatory Visit (INDEPENDENT_AMBULATORY_CARE_PROVIDER_SITE_OTHER): Payer: Medicare Other | Admitting: Internal Medicine

## 2021-04-05 ENCOUNTER — Other Ambulatory Visit: Payer: Self-pay

## 2021-04-05 VITALS — BP 126/72 | HR 94 | Temp 98.2°F | Ht 67.0 in | Wt 212.2 lb

## 2021-04-05 DIAGNOSIS — R0989 Other specified symptoms and signs involving the circulatory and respiratory systems: Secondary | ICD-10-CM

## 2021-04-05 DIAGNOSIS — Z5181 Encounter for therapeutic drug level monitoring: Secondary | ICD-10-CM | POA: Diagnosis not present

## 2021-04-05 DIAGNOSIS — R053 Chronic cough: Secondary | ICD-10-CM

## 2021-04-05 DIAGNOSIS — J9611 Chronic respiratory failure with hypoxia: Secondary | ICD-10-CM | POA: Diagnosis not present

## 2021-04-05 DIAGNOSIS — J849 Interstitial pulmonary disease, unspecified: Secondary | ICD-10-CM

## 2021-04-05 DIAGNOSIS — R768 Other specified abnormal immunological findings in serum: Secondary | ICD-10-CM

## 2021-04-05 NOTE — Progress Notes (Signed)
? ? ? ? ? Cardiology Dr. Loni Beckwith 02/15/20 ?Dustin Dean is 71 y.o. year old male with a history of No prior cardiovascular disease who presents to [facility] for the evaluation of a heart murmur. This patient is a non-smoker and denies a previous history of myocardial infarction, congestive heart failure, or valvular heart disease. 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 He really has little in the way of exertional chest symptoms. Occasionally according to the wife if he walks up an incline briskly he may have some minor shortness of breath but remains active and has little in the way limitation. He denies a previous history of myocardial infarction, congestive heart failure, or known valvular heart disease. At a recent examination he was found to have a heart murmur and an echocardiogram was performed. It demonstrated a peak gradient across his aortic valve of 30 mmHg and an aortic valve area of 1.1 cm?. This was read as being consistent with moderate aortic stenosis. I had a fairly significant discussion with the patient and his wife about the implications of his aortic valve and the need to follow the aortic stenosis progression over time. ? ?11/14/2020 office visit with Dustin Dean pulmonary physician assistant at [facility] ? ? ? ?Dustin Dean has past medical history of restrictive lung disease, interstitial lung disease, allergic rhinitis, laryngeal pharyngeal reflux, arthritis, lung nodules and persistent cough. He is a non-smoker and has never smoked. He has medication allergy to pseudoephedrine. ? ?[name] presents today with complaint of a progressively worsening shortness of breath and dyspnea on exertion over the past several months. We have been following his interstitial lung disease since 2018. He has been stable without complaint of dyspnea on exertion over the past several years. He indicates that recently he has had more shortness of  breath with exertion. He and his wife will walk the dog in their usual route through the neighborhood that they have done for many years. He reports that he will get short of breath along the root in a way that he has never been bothered with previously. He has recently requested an albuterol inhaler and reports that it has been helpful. He has been using Symbicort 160/4.5 twice daily but has been having difficulty using it successfully as he has been coughing significantly and loses a lot of the medication. A recent chest CT conducted 10/24/2020 notes a progression in the interstitial changes. A 9 mm right lower lobe nodule is also noted. The plan is to have a 65-monthfollow-up chest CT conducted 02/04/2021. A 6-minute walk test conducted today shows oxygen desaturation and need for supplemental oxygen. I will place the order for supplemental oxygen. [facility]. ? ? ?Patient at rest on RA with baseline sats : 97% ?After exertion for 3 min on RA pt's sats were : 86% ?Patient placed on oxygen via nasal cannula at 2 lpm flow via portable oxygen concentrator device ?Exertion for 6 mins with oxygen confirmed final sats at 94%  ? ?#1 lung nodules ?Chest CT 10/24/2020 shows 9 mm right lower lobe lung nodule ?Follow-up chest CT scheduled for 337-montheevaluation in January 2023 ? ?2. Interstitial lung disease ?Chest CT 11/04/2020 notes fibrotic interstitial lung disease with evidence of progression compared to 08/28/2020 ?[facility] ? ? ?#Primary  Care dec 2022 ?Osteopenia ?Due for DXA. ?Prostate cancer ?Saw Dr. Louis Dean at [facility]. Biopsy + for cancer. They are just watching it. He is going to have MRI and another biopsy next visit. ? ? ?OV 01/24/2021 -evaluation and transfer of care to Dr. Chase Caller in ILD center in [place].  Referred by Dustin Dean patient support group leader for the pulmonary fibrosis foundation support group ? ?Subjective:  ?Patient ID: Dustin Dean, male , DOB: 04-Aug-1950 , age 44 y.o. , MRN: [id number] , ADDRESS: [address] ?[place] [address] ?PCP Dustin Boers, MD ?Patient Care Team: ?Dustin Boers, MD as PCP - General (Family Medicine) ? ?This Provider for this visit: Treatment Team:  ?Attending Provider: Brand Males, MD ? ? ? ?01/24/2021 -   ?Chief Complaint  ?Patient presents with  ? Consult  ?  Pt is here for a switch of providers to take over care for his pulmonary fibrosis. 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 Pt does become SOB with activities and also states that he does have a chronic cough.  ? ? ? ?HPI  ?Dustin Dean 71 y.o. -new evaluation for interstitial lung disease.  History is provided by review of the chart and also talking to the patient and his wife.  He tells me that he has had chronic cough since his teenage years.  But for the last few to several years its been more consistent.  He was seeing the physician assistant at [facility].  Somewhere along the way he got referred to Dr. Dennison Mascot right at [facility] some 3 years ago.  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  After that cough is largely improved except for occasional exacerbations in the fall in the winter.  Then in 2018 sometime after he went on gabapentin he was informed by the physician assistant that he might have early ILD.  He does not recollect any serology or biopsy being done.  He does recollect a few pulmonary function test.  Then all of a sudden starting September 2022 his cough "got out of hand" and also developed worsening shortness of breath.  This the first time he started developing worsening shortness of breath.  Then by November 2022 started needing exertional oxygen leading to current symptoms.  He was referred to [facility] but he preferred to establish with the ILD  center here in [place] with [place] based on [facility] recommendation.  He found Dustin Dean through Internet search for pulmonary fibrosis foundation.  His current symptom score is below.  He is not on any antifibrotic. ? ?[place] Integrated Comprehensive ILD Questionnaire ? ?Symptoms:  ?SYMPTOM SCALE - ILD 01/24/2021  ?Current weight   ?O2 use 2L [place] with ex  ?Shortness of Breath 0 -> 5 scale with 5 being worst (score 6 If unable to do)  ?At rest 0  ?Simple tasks - showers, clothes change, eating, shaving 1  ?Household (dishes, doing bed, laundry) 1  ?Shopping 2  ?Walking level at own pace 5  ?Walking up Stairs 5  ?Total (30-36) Dyspnea Score 14  ?How bad is your cough? 2  ?How bad is your fatigue 1  ?How bad is nausea 00000  ?How bad is vomiting? ? 0  ?How bad is diarrhea? 0  ?How bad is anxiety? 0  ?How bad is depression 0  ?Any chronic pain - if so where and how bad 0  ? ? ? ? ? ?Past Medical  History :  ?-He has obesity BMI 33-34 ?- Denies any collagen vascular disease ?- He is known to have aortic stenosis -sees cardiologist at [facility].  December 2021 echocardiogram EF 55 to 60% with tricuspid aortic valve and moderate stenosis normal right ventricle ?-Denies any asthma or COPD.  Denies any heart failure denies any collagen vascular disease ?-Denies snoring or excessive daytime somnolence.  Denies stroke.  Denies formal diagnosis of sleep apnea denies formal diagnosis of pulmonary hypertension [echo a year ago did not show pulmonary hypertension] ?-Has had vaccination against COVID but has not had COVID disease ?-Recent diagnosis of late 2022 early stage prostate cancer at [facility] on observation treatment ?-Has history of skin cancer for which he apply sunscreen regularly ? ?ROS:  ?-Shortness of breath present ?Cough chronic plus ?-Has intermittent chronic diarrhea and takes Imodium but no other GI side effects ?-Possible Raynaud's for the last several decades ? ?FAMILY HISTORY of LUNG DISEASE:   ?Denies family for pulmonary fibrosis.  Denies family Struve COPD or asthma sarcoid or cystic fibrosis.  Denies family history of hypersensitive pneumonitis.  Denies autoimmune disease.  Denies any pre

## 2021-04-05 NOTE — Patient Instructions (Addendum)
ICD-10-CM   ?1. ILD (interstitial lung disease) ([place])  J84.9   ?  ?2. Pulmonary air trapping  R09.89   ?  ?3. Chronic respiratory failure with hypoxia ([place])  J96.11   ?  ?4. Encounter for therapeutic drug monitoring  Z51.81   ?  ?5. Rheumatoid factor positive  R76.8   ?  ?6. Chronic cough  R05.3   ?  ? 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 ?-Most likely reason for admission to [facility] was probably respiratory viral infection not otherwise specified.  Differential diagnoses including possible hypersensitive pneumonitis flareup/ILD flareup.  Highly doubt this is nintedanib side effect based on literature but we took a shared decision making to have an open mind on this issue ? ?-Given the improvement in oxygen status and wellbeing, it is possible that you have a steroid responsive problem ? ?Plan ?-Complete prednisone taper by end of March 2023 and we will see what happens to your pulmonary status when you come off the prednisone ?- Restart nintedanib at 150 pill once daily with food for 1 week and then increase to 150 mg twice daily ?-Continue oxygen for pulse ox goal greater than 88% ?-Referral to rheumatology [recommendation from multidisciplinary case conference mid March 2023] due to elevated rheumatoid factor ?-Hold off on any bronchoscopy or surgical lung biopsy to determine the reason for your interstitial lung disease given recent flareup but can entertain during follow-up ?-Do spirometry and DLCO and 68 weeks ?-Control exposure possibilities ? -Get your crawlspace cleaned out a fungus [this is mold] ? -Avoid gardening -but can do inspection and supervision of workers but ensure distance from soil exposure and also wear a mask ? -Do a visual exam of your house again make sure there is no mold or  mildew ? -Avoid sick people ? ?Chronic cough ? ?Noticed that gabapentin is really helping you ? ?Plan ?- Okay to continue gabapentin ? ? ?Follow-up ?- Video visit with nurse practitioner in 3 weeks to ensure uptake with nintedanib is going well ?-Return to see Dr. Chase Caller in 6-8 weeks after pulmonary function test ? -30-minute visit ? ? ? ?

## 2021-04-14 ENCOUNTER — Ambulatory Visit: Payer: BLUE CROSS/BLUE SHIELD | Admitting: Internal Medicine

## 2021-04-18 ENCOUNTER — Ambulatory Visit
Admission: RE | Admit: 2021-04-18 | Discharge: 2021-04-18 | Disposition: A | Discharge status: Home / Self Care | Payer: Medicare Other | Source: Ambulatory Visit | Attending: Urology | Admitting: Urology

## 2021-04-18 DIAGNOSIS — C61 Malignant neoplasm of prostate: Secondary | ICD-10-CM

## 2021-04-18 MED ORDER — GADOBENATE DIMEGLUMINE 529 MG/ML IV SOLN
19.0000 mL | Freq: Once | INTRAVENOUS | Status: AC | PRN
  Administered 2021-04-18: 19 mL via INTRAVENOUS

## 2021-04-26 ENCOUNTER — Ambulatory Visit: Payer: Medicare Other

## 2021-04-26 ENCOUNTER — Telehealth (INDEPENDENT_AMBULATORY_CARE_PROVIDER_SITE_OTHER): Payer: Medicare Other | Admitting: Primary Care

## 2021-04-26 ENCOUNTER — Telehealth: Payer: Self-pay | Admitting: Primary Care

## 2021-04-26 ENCOUNTER — Ambulatory Visit (HOSPITAL_BASED_OUTPATIENT_CLINIC_OR_DEPARTMENT_OTHER)
Admission: RE | Admit: 2021-04-26 | Discharge: 2021-04-26 | Disposition: A | Discharge status: Home / Self Care | Payer: Medicare Other | Source: Ambulatory Visit | Attending: Primary Care | Admitting: Primary Care

## 2021-04-26 ENCOUNTER — Other Ambulatory Visit (INDEPENDENT_AMBULATORY_CARE_PROVIDER_SITE_OTHER): Payer: Medicare Other

## 2021-04-26 DIAGNOSIS — R509 Fever, unspecified: Secondary | ICD-10-CM | POA: Insufficient documentation

## 2021-04-26 DIAGNOSIS — J9611 Chronic respiratory failure with hypoxia: Secondary | ICD-10-CM

## 2021-04-26 DIAGNOSIS — J849 Interstitial pulmonary disease, unspecified: Secondary | ICD-10-CM | POA: Diagnosis not present

## 2021-04-26 LAB — CBC WITH DIFFERENTIAL/PLATELET
Basophils Absolute: 0 10*3/uL (ref 0.0–0.1)
Basophils Relative: 0.5 % (ref 0.0–3.0)
Eosinophils Absolute: 0.3 10*3/uL (ref 0.0–0.7)
Eosinophils Relative: 8.3 % — ABNORMAL HIGH (ref 0.0–5.0)
HCT: 43.4 % (ref 39.0–52.0)
Hemoglobin: 14.4 g/dL (ref 13.0–17.0)
Lymphocytes Relative: 19.6 % (ref 12.0–46.0)
Lymphs Abs: 0.7 10*3/uL (ref 0.7–4.0)
MCHC: 33.2 g/dL (ref 30.0–36.0)
MCV: 86.5 fl (ref 78.0–100.0)
Monocytes Absolute: 0.6 10*3/uL (ref 0.1–1.0)
Monocytes Relative: 16.2 % — ABNORMAL HIGH (ref 3.0–12.0)
Neutro Abs: 1.9 10*3/uL (ref 1.4–7.7)
Neutrophils Relative %: 55.4 % (ref 43.0–77.0)
Platelets: 124 10*3/uL — ABNORMAL LOW (ref 150.0–400.0)
RBC: 5.02 Mil/uL (ref 4.22–5.81)
RDW: 17 % — ABNORMAL HIGH (ref 11.5–15.5)
WBC: 3.4 10*3/uL — ABNORMAL LOW (ref 4.0–10.5)

## 2021-04-26 NOTE — Progress Notes (Signed)
Virtual Visit via Video Note  I connected with Dustin Dean on 06/05/21 at 10:00 AM EDT by a video enabled telemedicine application and verified that I am speaking with the correct person using two identifiers.  Location: Patient: Home Provider: Office    I discussed the limitations of evaluation and management by telemedicine and the availability of in person appointments. The patient expressed understanding and agreed to proceed.  History of Present Illness: 71 year old male, never smoked.  Past medical history significant for ILD and chronic respiratory failure with hypoxia.  Patient of Dr. Chase Caller, last seen in office on 04/05/2021. Plan complete prednisone taper in March. Instructed to restart nintedanib '150mg'$  daily x 1 week then increase to twice daily  04/26/2021- Interim hx  Patient presents today for ILD follow-up. He has been taking OFEV for three weeks, currently on '150mg'$  twice daily. He has been off oral prednisone for two weeks. Breathing is not significantly worse off steroids. Cough is the same. Since Saturday he developed chills, fatigue and low grade fever at night. Temp 100.1-100.3. He reports some heavy breathing. Oxygen 92% or higher. Urinary frequency. Having some incontinence. Continues on Gabapentin for chronic cough. Referred to rheumatology, has an apt beginning of June. He has been avoiding exposures mold/mildew, gardening.     SYMPTOM SCALE - ILD 01/24/2021 02/23/2021   04/05/2021 Post hopsilaization. On pred taper and off ofev Ofev 150 twice daily; No prednsone   Current weight         O2 use 2L [place] with ex 2L with ex 2L Wth ex at hoe 2L  Shortness of Breath 0 -> 5 scale with 5 being worst (score 6 If unable to do)       At rest 0 0 0 1  Simple tasks - showers, clothes change, eating, shaving 1 1 0.5 2  Household (dishes, doing bed, laundry) '1 1 1 2  '$ Shopping 2 1 0 NA  Walking level at own pace 5 2 0 1 (with oxygen)  Walking up Stairs '5 3 1 '$ with o2, 6 without o2 2  (with oxygen)  Total (30-36) Dyspnea Score '14 8   8  '$ How bad is your cough? 2 3 0 with gbapentin 1-2 (on gabapentin)  How bad is your fatigue 1 2 0 4  How bad is nausea 00000 0 0 0  How bad is vomiting?   0 0 0 0  How bad is diarrhea? 0 0 0 0  How bad is anxiety? 0 0 0 0  How bad is depression 0 0 0 0 (on antidepressant)  Any chronic pain - if so where and how bad 0 0 0 0     Observations/Objective:  - Appears well; No visible shortness of breath, wheezing or cough. Able to speak in full sentences  Assessment and Plan:  ILD: - Overall stable. Restarted OFEV.  - Breathing has not significantly declined off oral prednisone   Fever of unknown origin: - Patient developed chills, fever x-34 nights ago. Associated urinary frequency and incontinence. Checking CBC with diff, CXR and UA C&S   Follow Up Instructions:   - As needed  I discussed the assessment and treatment plan with the patient. The patient was provided an opportunity to ask questions and all were answered. The patient agreed with the plan and demonstrated an understanding of the instructions.   The patient was advised to call back or seek an in-person evaluation if the symptoms worsen or if the condition fails to improve as  anticipated.  I provided 22 minutes of non-face-to-face time during this encounter.   Martyn Ehrich, NP

## 2021-04-26 NOTE — Telephone Encounter (Signed)
Called and spoke with Evonne about lab orders. Stated to her that the orders were in but she said she could not see them. When stated to her that the agency was [facility], she said that was why she could not see them as she was not [facility]. She would let pt know that he would need to go back down to a different floor at [place]. Nothing further needed. ?

## 2021-04-26 NOTE — Telephone Encounter (Signed)
Patient is at lab waiting.Dustin Dean ? ?

## 2021-04-27 LAB — URINALYSIS
Bilirubin Urine: NEGATIVE
Hgb urine dipstick: NEGATIVE
Ketones, ur: NEGATIVE
Leukocytes,Ua: NEGATIVE
Nitrite: NEGATIVE
Specific Gravity, Urine: 1.025 (ref 1.000–1.030)
Urine Glucose: NEGATIVE
Urobilinogen, UA: 0.2 (ref 0.0–1.0)
pH: 6 (ref 5.0–8.0)

## 2021-04-27 LAB — URINE CULTURE
MICRO NUMBER:: 13254064
SPECIMEN QUALITY:: ADEQUATE

## 2021-04-27 NOTE — Progress Notes (Signed)
WBC was low-normal, have him test for covid. Awaiting urine culture. I am out of office fri-mon, have him call for results tomorrow. If positive will needed treatment. Will need repeat CBC in 2 weeks.

## 2021-04-27 NOTE — Progress Notes (Signed)
Advance is another term for severe pulmonary fibrosis. Findings are stable without worsening or acute findings.

## 2021-04-27 NOTE — Progress Notes (Signed)
See above. UA not consistent with UTI, we will have more information have culture comes back. Recommend he push fluids

## 2021-05-01 ENCOUNTER — Emergency Department (HOSPITAL_BASED_OUTPATIENT_CLINIC_OR_DEPARTMENT_OTHER): Payer: Medicare Other

## 2021-05-01 ENCOUNTER — Encounter (HOSPITAL_BASED_OUTPATIENT_CLINIC_OR_DEPARTMENT_OTHER): Payer: Self-pay

## 2021-05-01 ENCOUNTER — Emergency Department (HOSPITAL_BASED_OUTPATIENT_CLINIC_OR_DEPARTMENT_OTHER)
Admission: EM | Admit: 2021-05-01 | Discharge: 2021-05-01 | Disposition: A | Discharge status: Home / Self Care | Payer: Medicare Other | Attending: Emergency Medicine | Admitting: Emergency Medicine

## 2021-05-01 ENCOUNTER — Telehealth: Payer: Self-pay | Admitting: Adult Health

## 2021-05-01 ENCOUNTER — Other Ambulatory Visit: Payer: Self-pay

## 2021-05-01 ENCOUNTER — Telehealth: Payer: Self-pay | Admitting: Internal Medicine

## 2021-05-01 DIAGNOSIS — R5381 Other malaise: Secondary | ICD-10-CM | POA: Diagnosis not present

## 2021-05-01 DIAGNOSIS — R410 Disorientation, unspecified: Secondary | ICD-10-CM | POA: Diagnosis not present

## 2021-05-01 DIAGNOSIS — J8410 Pulmonary fibrosis, unspecified: Secondary | ICD-10-CM | POA: Diagnosis not present

## 2021-05-01 DIAGNOSIS — R0602 Shortness of breath: Secondary | ICD-10-CM | POA: Diagnosis not present

## 2021-05-01 DIAGNOSIS — R5383 Other fatigue: Secondary | ICD-10-CM | POA: Insufficient documentation

## 2021-05-01 DIAGNOSIS — R7989 Other specified abnormal findings of blood chemistry: Secondary | ICD-10-CM | POA: Insufficient documentation

## 2021-05-01 DIAGNOSIS — Z7982 Long term (current) use of aspirin: Secondary | ICD-10-CM | POA: Diagnosis not present

## 2021-05-01 DIAGNOSIS — R000 Tachycardia, unspecified: Secondary | ICD-10-CM | POA: Insufficient documentation

## 2021-05-01 DIAGNOSIS — R011 Cardiac murmur, unspecified: Secondary | ICD-10-CM | POA: Insufficient documentation

## 2021-05-01 DIAGNOSIS — Z79899 Other long term (current) drug therapy: Secondary | ICD-10-CM | POA: Diagnosis not present

## 2021-05-01 DIAGNOSIS — R519 Headache, unspecified: Secondary | ICD-10-CM | POA: Diagnosis not present

## 2021-05-01 DIAGNOSIS — Z20822 Contact with and (suspected) exposure to covid-19: Secondary | ICD-10-CM | POA: Insufficient documentation

## 2021-05-01 DIAGNOSIS — R0682 Tachypnea, not elsewhere classified: Secondary | ICD-10-CM | POA: Insufficient documentation

## 2021-05-01 DIAGNOSIS — Z8546 Personal history of malignant neoplasm of prostate: Secondary | ICD-10-CM | POA: Diagnosis not present

## 2021-05-01 DIAGNOSIS — R509 Fever, unspecified: Secondary | ICD-10-CM | POA: Diagnosis present

## 2021-05-01 DIAGNOSIS — R7401 Elevation of levels of liver transaminase levels: Secondary | ICD-10-CM | POA: Diagnosis not present

## 2021-05-01 DIAGNOSIS — R059 Cough, unspecified: Secondary | ICD-10-CM | POA: Insufficient documentation

## 2021-05-01 HISTORY — DX: Pulmonary fibrosis, unspecified: J84.10

## 2021-05-01 HISTORY — DX: Malignant neoplasm of prostate: C61

## 2021-05-01 HISTORY — DX: Nonrheumatic aortic (valve) stenosis: I35.0

## 2021-05-01 LAB — URINALYSIS, ROUTINE W REFLEX MICROSCOPIC
Bilirubin Urine: NEGATIVE
Glucose, UA: NEGATIVE mg/dL
Hgb urine dipstick: NEGATIVE
Ketones, ur: NEGATIVE mg/dL
Leukocytes,Ua: NEGATIVE
Nitrite: NEGATIVE
Protein, ur: NEGATIVE mg/dL
Specific Gravity, Urine: 1.015 (ref 1.005–1.030)
pH: 7 (ref 5.0–8.0)

## 2021-05-01 LAB — COMPREHENSIVE METABOLIC PANEL
ALT: 49 U/L — ABNORMAL HIGH (ref 0–44)
AST: 57 U/L — ABNORMAL HIGH (ref 15–41)
Albumin: 3.8 g/dL (ref 3.5–5.0)
Alkaline Phosphatase: 161 U/L — ABNORMAL HIGH (ref 38–126)
Anion gap: 9 (ref 5–15)
BUN: 15 mg/dL (ref 8–23)
CO2: 25 mmol/L (ref 22–32)
Calcium: 8.7 mg/dL — ABNORMAL LOW (ref 8.9–10.3)
Chloride: 101 mmol/L (ref 98–111)
Creatinine, Ser: 0.94 mg/dL (ref 0.61–1.24)
GFR, Estimated: >60 mL/min (ref >60)
Glucose, Bld: 136 mg/dL — ABNORMAL HIGH (ref 70–99)
Potassium: 3.4 mmol/L — ABNORMAL LOW (ref 3.5–5.1)
Sodium: 135 mmol/L (ref 135–145)
Total Bilirubin: 0.9 mg/dL (ref 0.3–1.2)
Total Protein: 7.4 g/dL (ref 6.5–8.1)

## 2021-05-01 LAB — PROTIME-INR
INR: 1 (ref 0.8–1.2)
Prothrombin Time: 13.5 seconds (ref 11.4–15.2)

## 2021-05-01 LAB — CBC WITH DIFFERENTIAL/PLATELET
Abs Immature Granulocytes: 0.03 10*3/uL (ref 0.00–0.07)
Basophils Absolute: 0 10*3/uL (ref 0.0–0.1)
Basophils Relative: 1 %
Eosinophils Absolute: 0.2 10*3/uL (ref 0.0–0.5)
Eosinophils Relative: 3 %
HCT: 45.1 % (ref 39.0–52.0)
Hemoglobin: 14.9 g/dL (ref 13.0–17.0)
Immature Granulocytes: 1 %
Lymphocytes Relative: 9 %
Lymphs Abs: 0.6 10*3/uL — ABNORMAL LOW (ref 0.7–4.0)
MCH: 28.2 pg (ref 26.0–34.0)
MCHC: 33 g/dL (ref 30.0–36.0)
MCV: 85.3 fL (ref 80.0–100.0)
Monocytes Absolute: 0.3 10*3/uL (ref 0.1–1.0)
Monocytes Relative: 5 %
Neutro Abs: 5.1 10*3/uL (ref 1.7–7.7)
Neutrophils Relative %: 81 %
Platelets: 174 10*3/uL (ref 150–400)
RBC: 5.29 MIL/uL (ref 4.22–5.81)
RDW: 16.1 % — ABNORMAL HIGH (ref 11.5–15.5)
WBC: 6.2 10*3/uL (ref 4.0–10.5)
nRBC: 0 % (ref 0.0–0.2)

## 2021-05-01 LAB — RESP PANEL BY RT-PCR (FLU A&B, COVID) ARPGX2
Influenza A by PCR: NEGATIVE
Influenza B by PCR: NEGATIVE
SARS Coronavirus 2 by RT PCR: NEGATIVE

## 2021-05-01 LAB — LACTIC ACID, PLASMA: Lactic Acid, Venous: 1.5 mmol/L (ref 0.5–1.9)

## 2021-05-01 MED ORDER — IOHEXOL 300 MG/ML  SOLN
75.0000 mL | Freq: Once | INTRAMUSCULAR | Status: AC | PRN
  Administered 2021-05-01: 75 mL via INTRAVENOUS

## 2021-05-01 MED ORDER — ACETAMINOPHEN 325 MG PO TABS
650.0000 mg | ORAL_TABLET | Freq: Once | ORAL | Status: AC
  Administered 2021-05-01: 650 mg via ORAL
  Filled 2021-05-01: qty 2

## 2021-05-01 NOTE — ED Provider Notes (Signed)
?[facility] EMERGENCY DEPARTMENT ?Provider Note ? ? ?CSN: [id number] ?Arrival date & time: 05/01/21  1857 ? ?  ? ?History ? ?Chief Complaint  ?Patient presents with  ? Fever  ? ? ?Dustin Dean is a 71 y.o. male. ? ?The history is provided by the patient, a relative and medical records. No language interpreter was used.  ?Fever ?Max temp prior to arrival:  103 ?Temp source:  Oral ?Severity:  Severe ?Onset quality:  Gradual ?Duration:  1 day ?Timing:  Intermittent ?Progression:  Improving ?Chronicity:  Recurrent ?Relieved by:  Acetaminophen and aspirin ?Worsened by:  Nothing ?Ineffective treatments:  None tried ?Associated symptoms: chills, confusion, cough and headaches (mild)   ?Associated symptoms: no chest pain, no congestion, no diarrhea, no dysuria (frequency), no nausea, no rash, no rhinorrhea and no vomiting   ? ?  ? ?Home Medications ?Prior to Admission medications   ?Medication Sig Start Date End Date Taking? Authorizing Provider  ?acetaminophen (TYLENOL) 325 MG tablet Take by mouth.    [provider]  ?albuterol (VENTOLIN HFA) 108 (90 Base) MCG/ACT inhaler Inhale into the lungs. 11/03/20   [provider]  ?aspirin-acetaminophen-caffeine (EXCEDRIN MIGRAINE) [phone] MG tablet Take by mouth.    [provider]  ?Calcium Carb-Cholecalciferol (CALCIUM/VITAMIN D PO) Take 1 tablet by mouth daily.    [provider]  ?Cetirizine HCl (ZYRTEC PO) Take by mouth.    [provider]  ?fluorouracil (EFUDEX) 5 % cream Apply topically. 12/21/20   [provider]  ?fluticasone (FLONASE) 50 MCG/ACT nasal spray Place into both nostrils daily.    [provider]  ?gabapentin (NEURONTIN) 300 MG capsule Take 300 mg by mouth 3 (three) times daily. 09/07/20   [provider]  ?loperamide (IMODIUM) 2 MG capsule Take by mouth. 01/16/08   [provider]  ?meclizine (ANTIVERT) 25 MG tablet Take 25 mg by mouth 2 (two) times daily as needed.  10/18/20   [provider]  ?Multiple Vitamin (MULTIVITAMIN) capsule Take 1 capsule by mouth every morning.    [provider]  ?naproxen sodium (ALEVE) 220 MG tablet Take 220 mg by mouth daily as needed.    [provider]  ?Nintedanib (OFEV) 100 MG CAPS  03/02/21   [provider]  ?omeprazole (PRILOSEC) 40 MG capsule Take 40 mg by mouth daily. 09/27/20   [provider]  ?PAROXETINE HCL PO Take by mouth.    [provider]  ?rosuvastatin (CRESTOR) 5 MG tablet Take 1 tablet by mouth daily. 09/16/18   [provider]  ?SUMAtriptan (IMITREX) 100 MG tablet TAKE ONE TABLET BY MOUTH AT ONSET OF HEADACHE; MAY REPEAT ONE TABLET IN 2 HOURS IF NEEDED. MAX OF 200 MG PER DAY 10/13/18   [provider]  ?   ? ?Allergies    ?Bee venom, Shellfish allergy, and Pseudoephedrine   ? ?Review of Systems   ?Review of Systems  ?Constitutional:  Positive for chills, fatigue and fever. Negative for diaphoresis.  ?HENT:  Negative for congestion and rhinorrhea.   ?Eyes:  Negative for visual disturbance.  ?Respiratory:  Positive for cough and shortness of breath (slightly worse than normal). Negative for chest tightness and wheezing.   ?Cardiovascular:  Positive for leg swelling (at baseline). Negative for chest pain and palpitations.  ?Gastrointestinal:  Negative for abdominal pain, diarrhea, nausea and vomiting.  ?Genitourinary:  Positive for frequency. Negative for decreased urine volume, dysuria (frequency) and flank pain.  ?Musculoskeletal:  Negative for back pain, neck pain  and neck stiffness.  ?Skin:  Negative for rash and wound.  ?Neurological:  Positive for headaches (mild). Negative for dizziness, weakness, light-headedness and numbness.  ?Psychiatric/Behavioral:  Positive for confusion. Negative for agitation.   ?All other systems reviewed and are negative. ? ?Physical Exam ?Updated Vital Signs ?BP (!) 134/95 (BP Location: Left Arm)   Pulse (!) 126   Temp (!)  101.1 ?F (38.4 ?C) (Tympanic)   Resp (!) 26   Ht '5\' 7"'$  (1.702 m)   Wt 93 kg   SpO2 96%   BMI 32.11 kg/m?  ?Physical Exam ?Vitals and nursing note reviewed.  ?Constitutional:   ?   General: He is not in acute distress. ?   Appearance: He is well-developed. He is not ill-appearing, toxic-appearing or diaphoretic.  ?HENT:  ?   Head: Normocephalic and atraumatic.  ?   Nose: No congestion or rhinorrhea.  ?   Mouth/Throat:  ?   Mouth: Mucous membranes are moist.  ?   Pharynx: No oropharyngeal exudate or posterior oropharyngeal erythema.  ?Eyes:  ?   Extraocular Movements: Extraocular movements intact.  ?   Conjunctiva/sclera: Conjunctivae normal.  ?   Pupils: Pupils are equal, round, and reactive to light.  ?Cardiovascular:  ?   Rate and Rhythm: Regular rhythm. Tachycardia present.  ?   Heart sounds: No murmur heard. ?Pulmonary:  ?   Effort: Tachypnea present. No respiratory distress.  ?   Breath sounds: Rhonchi present. No wheezing or rales.  ?Chest:  ?   Chest wall: No tenderness.  ?Abdominal:  ?   General: Abdomen is flat.  ?   Palpations: Abdomen is soft.  ?   Tenderness: There is no abdominal tenderness. There is no right CVA tenderness, left CVA tenderness, guarding or rebound.  ?Musculoskeletal:     ?   General: No swelling or tenderness.  ?   Cervical back: Neck supple. No tenderness.  ?   Right lower leg: No edema.  ?   Left lower leg: No edema.  ?Skin: ?   General: Skin is warm and dry.  ?   Capillary Refill: Capillary refill takes less than 2 seconds.  ?   Findings: No erythema or rash.  ?Neurological:  ?   General: No focal deficit present.  ?   Mental Status: He is alert.  ?   Sensory: No sensory deficit.  ?   Motor: No weakness.  ?Psychiatric:     ?   Mood and Affect: Mood normal.  ? ? ?ED Results / Procedures / Treatments   ?Labs ?(all labs ordered are listed, but only abnormal results are displayed) ?Labs Reviewed  ?COMPREHENSIVE METABOLIC PANEL - Abnormal; Notable for the following components:  ?     Result Value  ? Potassium 3.4 (*)   ? Glucose, Bld 136 (*)   ? Calcium 8.7 (*)   ? AST 57 (*)   ? ALT 49 (*)   ? Alkaline Phosphatase 161 (*)   ? All other components within normal limits  ?CBC WITH DIFFERENTIAL/PLATELET - Abnormal; Notable for the following components:  ? RDW 16.1 (*)   ? Lymphs Abs 0.6 (*)   ? All other components within normal limits  ?RESP PANEL BY RT-PCR (FLU A&B, COVID) [id number]  ?CULTURE, BLOOD (ROUTINE X 2)  ?CULTURE, BLOOD (ROUTINE X 2)  ?URINE CULTURE  ?LACTIC ACID, PLASMA  ?PROTIME-INR  ?URINALYSIS, ROUTINE W REFLEX MICROSCOPIC  ? ? ?EKG ?EKG Interpretation ? ?Date/Time:  Monday May 01 2021 19:12:06  EDT ?Ventricular Rate:  121 ?PR Interval:  162 ?QRS Duration: 84 ?QT Interval:  314 ?QTC Calculation: 445 ?R Axis:   -28 ?Text Interpretation: Sinus tachycardia Left axis deviation Abnormal ECG When compared with ECG of 17-Feb-2016 19:21, PREVIOUS ECG IS PRESENT when compared to prior, faster rate. NO STEMI Confirmed by Antony Blackbird [phone]) on 05/01/2021 7:55:34 PM ? ?Radiology ?CT Chest W Contrast ? ?Result Date: 05/01/2021 ?CLINICAL DATA:  Respiratory distress. EXAM: CT CHEST WITH CONTRAST TECHNIQUE: Multidetector CT imaging of the chest was performed during intravenous contrast administration. RADIATION DOSE REDUCTION: This exam was performed according to the departmental dose-optimization program which includes automated exposure control, adjustment of the mA and/or kV according to patient size and/or use of iterative reconstruction technique. CONTRAST:  5m OMNIPAQUE IOHEXOL 300 MG/ML  SOLN COMPARISON:  Chest CT dated 03/22/2021. FINDINGS: Cardiovascular: There is no cardiomegaly or pericardial effusion. Calcification of the aortic annulus and leaflets. The thoracic aorta is unremarkable. The origins of the great vessels of the aortic arch appear patent. The central pulmonary arteries are grossly unremarkable for the degree of opacification. Mediastinum/Nodes: Top-normal mediastinal  lymph nodes measuring up to 13 mm in the prevascular space similar to prior CT, likely reactive. The esophagus is grossly unremarkable. No mediastinal fluid collection. Lungs/Pleura: Similar appearance of diffuse fib

## 2021-05-01 NOTE — ED Notes (Signed)
ED Provider at bedside. 

## 2021-05-01 NOTE — ED Triage Notes (Signed)
C/o fever, chills, fatigue since 1400 today. Took excedrin at 1500. Hx of pulmonary fibrosis. Wears 2L O2 via [place] at baseline ?

## 2021-05-01 NOTE — Discharge Instructions (Signed)
Your history, exam, work-up today revealed a febrile illness but we did not find any definite evidence of an acute bacterial infection.  The x-ray and CT did not show evidence of a pneumonia, your urine did not show UTI, and your other labs were overall reassuring and similar to prior.  You did have slight elevation in your liver function but not critically and you had no pain on exam.  Also, the CT did not report any evidence of acute cholecystitis from the chest CT.  As you are back to your baseline and improving stability for over 3-1/2 hours without any further fevers or problems, we agree with discharge home.  Please call your doctor to get seen in the next day or 2 for reassessment and if any symptoms were to change or worsen acutely, please return to the nearest emergency department.  Please rest and stay hydrated. ?

## 2021-05-01 NOTE — ED Notes (Signed)
Patient given ice water and warm blanket. Denies any other needs at present.  ?

## 2021-05-01 NOTE — ED Notes (Signed)
ED Provider at bedside. Discussed lab results in detail, adding CT scan of chest.  ?

## 2021-05-01 NOTE — ED Notes (Signed)
Patient requesting medication for his headache. MD Tegeler notified.  ?

## 2021-05-01 NOTE — Telephone Encounter (Signed)
Spoke with the pt  ?She states started having chills and fever today a few hours ago- temp 100.7  ?He is coughing more- non prod  ?He is not having increased SOB  ?OV with TP at 10 am tomorrow  ?Advised to seek emergent care sooner if needed ?

## 2021-05-01 NOTE — Telephone Encounter (Signed)
Patient with ongoing fever now up to 103.  Patient has some intermittent confusion per wife. ?Was seen via video visit on April 26, 2020.  Chest x-ray and labs were unrevealing. ?Patient says symptoms got better for a while and then today developed fever up to 103. ?Patient has an appointment tomorrow but does not feel like he can wait.  Office is currently closed.  Advised him to seek emergency room care.  ?High risk for decompensation. ?Patient's wife says that they will take him to the med center in [facility] for evaluation ? ?Sent to Dr. Chase Caller for [place]. ? ?Please contact office for sooner follow up if symptoms do not improve or worsen or seek emergency care  ? ?

## 2021-05-01 NOTE — ED Notes (Signed)
Patient transported to CT 

## 2021-05-02 ENCOUNTER — Ambulatory Visit (INDEPENDENT_AMBULATORY_CARE_PROVIDER_SITE_OTHER): Payer: Medicare Other | Admitting: Adult Health

## 2021-05-02 ENCOUNTER — Encounter: Payer: Self-pay | Admitting: Adult Health

## 2021-05-02 DIAGNOSIS — R509 Fever, unspecified: Secondary | ICD-10-CM

## 2021-05-02 DIAGNOSIS — J9611 Chronic respiratory failure with hypoxia: Secondary | ICD-10-CM

## 2021-05-02 DIAGNOSIS — J849 Interstitial pulmonary disease, unspecified: Secondary | ICD-10-CM | POA: Diagnosis not present

## 2021-05-02 NOTE — Assessment & Plan Note (Signed)
No increased oxygen demands.  Continue on oxygen to maintain O2 saturations greater than 88 to 90%. ?

## 2021-05-02 NOTE — Patient Instructions (Signed)
Continue on Oxygen 2l/m with activity and At bedtime  . Goal is for O2 sats >88-90%.  ?Activity as tolerated.  ?Delsym 2 tsp Twice daily  for cough as needed  ?Tessalon Three times a day  for cough as needed  ?Sips of water to help cough  ?Remain off [place] for now.  ?If fever returns call our office .  ?Refer to pulmonary rehab.  ?Follow up with Dr. Chase Caller in 2 weeks as planned  ?Please contact office for sooner follow up if symptoms do not improve or worsen or seek emergency care  ? ? ? ?

## 2021-05-02 NOTE — Assessment & Plan Note (Signed)
Appears stable without flare of symptoms.  No increased oxygen demands.  CT chest showed stable ILD changes without acute process ?Patient has had progression of his ILD.  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 ?Rheumatoid factor has been elevated and rheumatology consult is pending. ?For now we will need to remain off of Ofev.  On return visit we will need to consider rechallenge or possible Esbriet start ?Referred to pulmonary rehab ? ?Plan  ?Patient Instructions  ?Continue on Oxygen 2l/m with activity and At bedtime  . Goal is for O2 sats >88-90%.  ?Activity as tolerated.  ?Delsym 2 tsp Twice daily  for cough as needed  ?Tessalon Three times a day  for cough as needed  ?Sips of water to help cough  ?Remain off [place] for now.  ?If fever returns call our office .  ?Refer to pulmonary rehab.  ?Follow up with Dr. Chase Caller in 2 weeks as planned  ?Please contact office for sooner follow up if symptoms do not improve or worsen or seek emergency care  ? ? ? ?  ? ?

## 2021-05-02 NOTE — Assessment & Plan Note (Signed)
ER notes reviewed, recurrent fever over the last 4 to 6 weeks.  Unknown etiology.  Work-up has been unrevealing.  Most recent work-up in the emergency room showed normal white blood cell count, stable CT chest for ILD with no acute process.  Urinalysis unrevealing.  Viral panel negative blood cultures are negative to date.  Urine culture is pending.  Exam is unrevealing.  Fever did start after beginning Ofev.  Unusual presentation with Ofev. ?For now we will remain off of Ofev.  We will begin a drug holiday.  Patient is a follow-up visit in 2 weeks with pulmonary function testing. ?Also liver function testing were elevated on Ofev, he is also on a statin.  We will remain off of Ofev and repeat labs on return visit.  Patient is advised if fever returns off of Ofev he is to contact us immediately and will need further evaluation for ongoing fever. ? ?

## 2021-05-02 NOTE — Progress Notes (Signed)
? ?'@Patient'$  ID: Mar Walmer, male    DOB: 1950/08/07, 71 y.o.   MRN: [id number] ? ?Chief Complaint  ?Patient presents with  ? Acute Visit  ? ? ?Referring provider: ?Joneen Boers, MD ? ?HPI: ?71 year old male followed for interstitial lung disease (diagnosed in February 2019).  Seen for pulmonary consult to establish for ILD January 24, 2021 ?Medical history significant for aortic valve stenosis followed by cardiology at [facility].  He has a history of prostate cancer ? ?TEST/EVENTS :  ?December 2021 echocardiogram EF 55 to 60% with tricuspid aortic valve and moderate stenosis normal right ventricle ?  ?  ?High-resolution CT scan of the chest August 2020 ? -There is a report of honeycombing but without any craniocaudal gradient.  Air-trapping reported alternative pattern more suggestive of chronic hypersensitive pneumonitis.  But there is already progression from August 2019 ?  ?Fibrotic interstitial lung disease with evidence of progression when  ?compared with August 29, 2018 prior exam. Favor fibrotic  ?hypersensitivity pneumonitis given presence of air trapping.  ?Findings are suggestive of an alternative diagnosis (not UIP ?  ?  ?9 mm right lower lobe nodule seen in October 2022 for the first time: Stable January 2023 ?  ?CT Chest data January 2023 ?   ?Mediastinum/Nodes: Multiple prominent borderline but nonenlarged ?mediastinal and bilateral hilar lymph nodes, similar to the prior ?study esophagus is unremarkable in appearance. No axillary ?lymphadenopathy. ?  ?Lungs/Pleura: High-resolution images again demonstrate widespread ?but patchy areas of ground-glass attenuation, septal thickening, ?subpleural reticulation, thickening of the peribronchovascular ?interstitium, cylindrical and varicose traction bronchiectasis, ?peripheral bronchiolectasis and extensive honeycombing. There is no ?discernible craniocaudal gradient. The most extensive areas of ?honeycombing are anterior lung, predominantly in the  upper lobes and ?right middle lobe. Some areas of the lung bases demonstrate relative ?sparing. Inspiratory and expiratory imaging demonstrates mild to ?moderate air trapping indicative of small airways disease. No ?definite progression compared to the recent prior study. No acute ?consolidative airspace disease. No pleural effusions. Previously ?described nodular area of architectural distortion in the right ?lower lobe (axial image 106 of series 8) is stable measuring 8 mm on ?today's examination, likely part of the underlying fibrosis. No ?other definite suspicious appearing pulmonary nodules or masses are ?noted. ?  ?Upper Abdomen: Unremarkable. ?  ?Musculoskeletal: There are no aggressive appearing lytic or blastic ?lesions noted in the visualized portions of the skeleton. ?  ?IMPRESSION: ?1. 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 ?2. Previously noted nodular area of architectural distortion in the ?right lower lobe is unchanged, likely part of the fibrosis rather ?than a pulmonary nodule. ?3. There are calcifications of the aortic valve and mitral annulus. ?Echocardiographic correlation for evaluation of potential valvular ?dysfunction may be warranted if clinically indicated. ?4. Aortic atherosclerosis, in addition to 2 vessel coronary artery ?disease. Please note that although the presence of coronary artery ?calcium documents the presence of coronary artery disease, the ?severity of this disease and any potential stenosis cannot be ?assessed on this non-gated CT examination. Assessment for potential ?risk factor modification, dietary therapy or pharmacologic therapy ?may be warranted, if clinically indicated. ?  ?Aortic  Atherosclerosis (ICD10-I70.0). ? ?05/02/2021 Follow up : ILD , Fever  ?Patient presents for an acute office visit.  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  Serial CT chest from August 2020 to August 2022 showed progressive changes.  Serology was negative except for a positive rheumatoid factor.  He has been referred to rheumatology with consult pending.  Pulmonary function testing showed significant restriction and decreased DLCO.  Patient started Ofev on March 01, 2021.  Patient was admitted early March with severe weakness dyspnea and activity intolerance.  He also had intermittent confusion hypoxemia and a fever Tmax of 102.  He was felt to have an ILD flare with possible viral illness however COVID, influenza and viral panel were all negative.  He was treated with steroids.  CT chest showed fibrotic ILD stable since October 2022.  His Ofev was held during admission.  After discharge he said that his energy level was improved and had decreased oxygen demands.  Based on oxygen after discharge was oxygen 2 L with activity and at bedtime.  Not requiring oxygen at rest with O2 saturations greater than 90%.  Patient was restarted on Ofev April 05, 2020.  After 2 weeks of been on Ofev patient developed low-grade fever.  This is waxed and waned and yesterday developed a fever Tmax of 103.  Patient went to the emergency room work-up was unrevealing.  CT chest showed stable ILD with no acute process.  CBC was normal blood cultures are no growth to date.  Respiratory viral panel was negative.  Lactic acid was 1.5 urinalysis was normal, urine culture is pending.  LFTs were slightly elevated.  Since last night patient says he is feeling better. Fever is resolved. Took Tylenol . O2 saturations are 100%.  Has some sore throat that started late night .  ?Had good appetite today . No fever today . No increased Oxygen demands.  ?Wife  is Dajohn Ellender.  ?Patient denies any rash, skin lesions, skin redness, joint swelling, insect or tick bite, travel, discolored mucus, abdominal pain, nausea vomiting or diarrhea. Has no previous joint replacement or surgeries. Abdominal hernia repair >72yr ago. No abd pain,  ?[facility]  2D echo January 25, 2018 3 aortic valve is thickened with mild to moderate aortic valve stenosis.,  EF 55 to 60%.  Right ventricle is normal systolic function is normal.  ? ? ?Allergies  ?Allergen Reactions  ? Bee Venom Rash  ? Shellfish Allergy Hives  ? Pseudoephedrine Anxiety  ? ? ?Immunization History  ?Administered Date(s) Administered  ? Fluad Quad(high Dose 65+) 10/23/2017, 09/30/2019, 10/18/2020  ? Influenza-Unspecified 11/11/2009, 11/08/2011, 11/03/2013, 11/22/2014, 11/10/2015  ? Moderna Covid-19 Vaccine Bivalent Booster 138yr& up 10/24/2020  ? Moderna Sars-Covid-2 Vaccination 03/19/2019, 04/16/2019, 11/16/2019, 05/07/2020  ? Pneumococcal Conjugate-13 12/08/2007, 12/21/2015, 09/04/2018  ? Pneumococcal Polysaccharide-23 12/08/2007, 06/21/2017, 06/16/2018  ? Tdap 12/08/2007, 11/27/2018  ? Zoster Recombinat (Shingrix) 10/30/2013, 09/04/2018, 11/27/2018  ? Zoster, Live 10/30/2013  ? Zoster, Unspecified 10/30/2013  ? ? ?Past Medical History:  ?Diagnosis Date  ? Aortic stenosis   ? Flu   ? Pneumonia   ? Prostate cancer ([facility]  ? Pulmonary fibrosis ([facility]  ? Skin cancer   ? ? ?Tobacco History: ?Social History  ? ?Tobacco Use  ?Smoking Status Never  ?Smokeless Tobacco Never  ? ?Counseling given: Not Answered ? ? ?Outpatient Medications Prior to Visit  ?Medication Sig Dispense Refill  ? acetaminophen (TYLENOL) 325 MG tablet Take by mouth.    ? albuterol (VENTOLIN HFA) 108 (90 Base) MCG/ACT inhaler Inhale into the lungs.    ? aspirin-acetaminophen-caffeine (EXCEDRIN MIGRAINE) [id number]  MG tablet Take by mouth.    ? Calcium Carb-Cholecalciferol (CALCIUM/VITAMIN D PO) Take 1 tablet by mouth daily.    ? Cetirizine HCl  (ZYRTEC PO) Take by mouth.    ? fluorouracil (EFUDEX) 5 % cream Apply topically.    ? fluticasone (FLONASE) 50 MCG/ACT nasal spray Place into both nostrils daily.    ? gabapentin (NEURONTIN) 300 MG capsule T

## 2021-05-03 LAB — URINE CULTURE: Culture: NO GROWTH

## 2021-05-04 NOTE — Telephone Encounter (Signed)
Thanks

## 2021-05-04 NOTE — Telephone Encounter (Signed)
Dustin Dean ? ?Please find out how [name] ? Is doing ? ?Thanks ? ? ? ?SIGNATURE  ? ? ?Dr. Brand Males, M.D., F.C.C.P,  ?Pulmonary and Critical Care Medicine ?Staff Physician, [place] ?Center Director - Interstitial Lung Disease  Program  ?Medical Director - [facility] ?[facility] ?[place], [place], [address] ? ?NPI Number:  NPI #[id number] ?DEA Number: [id number] ? ?Pager: [phone], If no answer  -> Check AMION or Try [phone] ?Telephone (clinical office): [phone] ?Telephone (research): [phone] ? ?10:02 AM ?05/04/2021 ? ?

## 2021-05-04 NOTE — Telephone Encounter (Signed)
Called and spoke with pt to see how he was feeling and pt said that he was feeling better. Pt said since OV with TP, he has not had any more headaches. Pt said that he has not had any more high fevers since he went to the ED. Had pt check temp while I had him on the phone and his temp was 99.1. ? ?Pt still remains off of the OFEV. ?

## 2021-05-06 LAB — CULTURE, BLOOD (ROUTINE X 2): Culture: NO GROWTH

## 2021-05-19 ENCOUNTER — Ambulatory Visit (INDEPENDENT_AMBULATORY_CARE_PROVIDER_SITE_OTHER): Payer: Medicare Other | Admitting: Internal Medicine

## 2021-05-19 ENCOUNTER — Encounter: Payer: Self-pay | Admitting: Internal Medicine

## 2021-05-19 VITALS — BP 124/78 | HR 78 | Temp 97.8°F | Ht 68.0 in | Wt 214.0 lb

## 2021-05-19 DIAGNOSIS — R768 Other specified abnormal immunological findings in serum: Secondary | ICD-10-CM

## 2021-05-19 DIAGNOSIS — Z7189 Other specified counseling: Secondary | ICD-10-CM | POA: Diagnosis not present

## 2021-05-19 DIAGNOSIS — R6510 Systemic inflammatory response syndrome (SIRS) of non-infectious origin without acute organ dysfunction: Secondary | ICD-10-CM | POA: Diagnosis not present

## 2021-05-19 DIAGNOSIS — J849 Interstitial pulmonary disease, unspecified: Secondary | ICD-10-CM | POA: Diagnosis not present

## 2021-05-19 LAB — HEPATIC FUNCTION PANEL
ALT: 18 U/L (ref 0–53)
AST: 25 U/L (ref 0–37)
Albumin: 3.9 g/dL (ref 3.5–5.2)
Alkaline Phosphatase: 116 U/L (ref 39–117)
Bilirubin, Direct: 0.1 mg/dL (ref 0.0–0.3)
Total Bilirubin: 0.4 mg/dL (ref 0.2–1.2)
Total Protein: 7 g/dL (ref 6.0–8.3)

## 2021-05-19 LAB — PULMONARY FUNCTION TEST
DL/VA % pred: 116 %
DL/VA: 4.81 ml/min/mmHg/L
DLCO cor % pred: 79 %
DLCO cor: 19.56 ml/min/mmHg
DLCO unc % pred: 79 %
DLCO unc: 19.56 ml/min/mmHg
FEF 25-75 Pre: 2.57 L/sec
FEF2575-%Pred-Pre: 107 %
FEV1-%Pred-Pre: 56 %
FEV1-Pre: 1.75 L
FEV1FVC-%Pred-Pre: 121 %
FEV6-%Pred-Pre: 49 %
FEV6-Pre: 1.95 L
FEV6FVC-%Pred-Pre: 105 %
FVC-%Pred-Pre: 46 %
FVC-Pre: 1.95 L
Pre FEV1/FVC ratio: 90 %
Pre FEV6/FVC Ratio: 100 %

## 2021-05-19 NOTE — Patient Instructions (Addendum)
?  ILD (interstitial lung disease) ([place]) -high suspect for chronic hypersensitive pneumonitis ?Pulmonary air trapping ?Chronic respiratory failure with hypoxia (HCC) ?Rheumatoid factor positive ?Encounter for therapeutic drug monitoring ? ?- reepeat SIRS response x 2 1-2 weeks after ofev makes ofev the most likely culprot ?- PFT stable feb 2023 -> May 2023 ?- Walk test adequate but consistent with ILD ? ?Plan (shared decision making) ?-List  nintedanib as ALLERGY (SIRS) ?-Referral to rheumatology [recommendation from multidisciplinary case conference mid March 2023] due to elevated rheumatoid factor ? - not sure where we are with this? ?-Hold off on any bronchoscopy or surgical lung biopsy at this point ?- Check LFT 05/19/2021 (pre esbiret, was high mid April 2023) ?- start pirfenidone per protocol ? - message sent to see if there is any donor sample ? - start 1 pill three times daily x 1 week -> then 2 pills three times daily x 1 week -> then 3 pills three times daily to continue ? - take with food ? - space 5-6 hours apart ? - apply sunscreen ? - hydrate well ?-Do spirometry and DLCO and 16  weeks ? ?Chronic cough ? ?Noticed that gabapentin is really helping you ? ?Plan ?- Okay to continue gabapentin ? ? ?Follow-up ?- Video visit in 6-8  weeks to ensure uptake with pirfenidone is going well ? - if video do 15 min visit ?- FAce to face  see Dr. Chase Caller in 16 weeks but after pulmonary function test ? -30-minute visit ? ? ? ?

## 2021-05-19 NOTE — Progress Notes (Signed)
? ? ? ? Cardiology Dr. Loni Beckwith 02/15/20 ?Dustin Dean is 71 y.o. year old male with a history of No prior cardiovascular disease who presents to [place] cardiology for the evaluation of a heart murmur. This patient is a non-smoker and denies a previous history of myocardial infarction, congestive heart failure, or valvular heart disease. 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 He really has little in the way of exertional chest symptoms. Occasionally according to the wife if he walks up an incline briskly he may have some minor shortness of breath but remains active and has little in the way limitation. He denies a previous history of myocardial infarction, congestive heart failure, or known valvular heart disease. At a recent examination he was found to have a heart murmur and an echocardiogram was performed. It demonstrated a peak gradient across his aortic valve of 30 mmHg and an aortic valve area of 1.1 cm?. This was read as being consistent with moderate aortic stenosis. I had a fairly significant discussion with the patient and his wife about the implications of his aortic valve and the need to follow the aortic stenosis progression over time. ? ?11/14/2020 office visit with Dustin Dean pulmonary physician assistant at [facility] ? ? ? ?Dustin Dean has past medical history of restrictive lung disease, interstitial lung disease, allergic rhinitis, laryngeal pharyngeal reflux, arthritis, lung nodules and persistent cough. He is a non-smoker and has never smoked. He has medication allergy to pseudoephedrine. ? ?Dustin Dean presents today with complaint of a progressively worsening shortness of breath and dyspnea on exertion over the past several months. We have been following his interstitial lung disease since 2018. He has been stable without complaint of dyspnea on exertion over the past several years. He indicates that recently he has had more shortness of  breath with exertion. He and his wife will walk the dog in their usual route through the neighborhood that they have done for many years. He reports that he will get short of breath along the root in a way that he has never been bothered with previously. He has recently requested an albuterol inhaler and reports that it has been helpful. He has been using Symbicort 160/4.5 twice daily but has been having difficulty using it successfully as he has been coughing significantly and loses a lot of the medication. A recent chest CT conducted 10/24/2020 notes a progression in the interstitial changes. A 9 mm right lower lobe nodule is also noted. The plan is to have a 37-monthfollow-up chest CT conducted 02/04/2021. A 6-minute walk test conducted today shows oxygen desaturation and need for supplemental oxygen. I will place the order for supplemental oxygen. [facility]. ? ? ?Patient at rest on RA with baseline sats : 97% ?After exertion for 3 min on RA pt's sats were : 86% ?Patient placed on oxygen via nasal cannula at 2 lpm flow via portable oxygen concentrator device ?Exertion for 6 mins with oxygen confirmed final sats at 94%  ? ?#1 lung nodules ?Chest CT 10/24/2020 shows 9 mm right lower lobe lung nodule ?Follow-up chest CT scheduled for 317-montheevaluation in January 2023 ? ?2. Interstitial lung disease ?Chest CT 11/04/2020 notes fibrotic interstitial lung disease with evidence of progression compared to 08/28/2020 ?[facility] and treatment ? ? ?#Primary Care  dec 2022 ?Osteopenia ?Due for DXA. ?Prostate cancer ?Saw Dr. Louis Dean at [facility]. Biopsy + for cancer. They are just watching it. He is going to have MRI and another biopsy next visit. ? ? ?OV 01/24/2021 -evaluation and transfer of care to Dr. Chase Dean in ILD center in [place].  Referred by Marlane Mingle patient support group leader for the pulmonary fibrosis foundation support group ? ?Subjective:  ?Patient ID: Dustin Dean, male , DOB: 06/15/1950 , age 71 y.o. , MRN: [id number] , ADDRESS: [id number] ?[place] [address] ?PCP Joneen Boers, MD ?Patient Care Team: ?Joneen Boers, MD as PCP - General (Family Medicine) ? ?This Provider for this visit: Treatment Team:  ?Attending Provider: Brand Males, MD ? ? ? ?01/24/2021 -   ?Chief Complaint  ?Patient presents with  ? Consult  ?  Pt is here for a switch of providers to take over care for his pulmonary fibrosis. 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 Pt does become SOB with activities and also states that he does have a chronic cough.  ? ? ? ?HPI  ?Dustin Dean 71 y.o. -new evaluation for interstitial lung disease.  History is provided by review of the chart and also talking to the patient and his wife.  He tells me that he has had chronic cough since his teenage years.  But for the last few to several years its been more consistent.  He was seeing the physician assistant at [facility].  Somewhere along the way he got referred to Dr. Dennison Mascot right at [facility] some 3 years ago.  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  After that cough is largely improved except for occasional exacerbations in the fall in the winter.  Then in 2018 sometime after he went on gabapentin he was informed by the physician assistant that he might have early ILD.  He does not recollect any serology or biopsy being done.  He does recollect a few pulmonary function test.  Then all of a sudden starting September 2022 his cough "got out of hand" and also developed worsening shortness of breath.  This the first time he started developing worsening shortness of breath.  Then by November 2022 started needing exertional oxygen leading to current symptoms.  He was referred to [facility] but he preferred to establish with the ILD  center here in [place] with [place] based on [facility] recommendation.  He found Marlane Mingle through Internet search for pulmonary fibrosis foundation.  His current symptom score is below.  He is not on any antifibrotic. ? ?[place] Integrated Comprehensive ILD Questionnaire ? ?Symptoms:  ?SYMPTOM SCALE - ILD 01/24/2021  ?Current weight   ?O2 use 2L [place] with ex  ?Shortness of Breath 0 -> 5 scale with 5 being worst (score 6 If unable to do)  ?At rest 0  ?Simple tasks - showers, clothes change, eating, shaving 1  ?Household (dishes, doing bed, laundry) 1  ?Shopping 2  ?Walking level at own pace 5  ?Walking up Stairs 5  ?Total (30-36) Dyspnea Score 14  ?How bad is your cough? 2  ?How bad is your fatigue 1  ?How bad is nausea 00000  ?How bad is vomiting? ? 0  ?How bad is diarrhea? 0  ?How bad is anxiety? 0  ?How bad is depression 0  ?Any chronic pain - if so where and how bad 0  ? ? ? ? ? ?Past Medical History :  ?-  He has obesity BMI 33-34 ?- Denies any collagen vascular disease ?- He is known to have aortic stenosis -sees cardiologist at [facility].  December 2021 echocardiogram EF 55 to 60% with tricuspid aortic valve and moderate stenosis normal right ventricle ?-Denies any asthma or COPD.  Denies any heart failure denies any collagen vascular disease ?-Denies snoring or excessive daytime somnolence.  Denies stroke.  Denies formal diagnosis of sleep apnea denies formal diagnosis of pulmonary hypertension [echo a year ago did not show pulmonary hypertension] ?-Has had vaccination against COVID but has not had COVID disease ?-Recent diagnosis of late 2022 early stage prostate cancer at [facility] urology on observation treatment ?-Has history of skin cancer for which he apply sunscreen regularly ? ?ROS:  ?-Shortness of breath present ?Cough chronic plus ?-Has intermittent chronic diarrhea and takes Imodium but no other GI side effects ?-Possible Raynaud's for the last several decades ? ?FAMILY HISTORY of LUNG DISEASE:   ?Denies family for pulmonary fibrosis.  Denies family Struve COPD or asthma sarcoid or cystic fibrosis.  Denies family history of hypersensitive pneumonitis.  Denies autoimmune disease.  Denies any prem

## 2021-05-19 NOTE — Progress Notes (Signed)
Spirometry/DLCO performed today. 

## 2021-05-19 NOTE — Patient Instructions (Signed)
Spirometry/DLCO performed today. 

## 2021-05-23 ENCOUNTER — Telehealth: Payer: Self-pay

## 2021-05-23 ENCOUNTER — Other Ambulatory Visit (HOSPITAL_COMMUNITY): Payer: Self-pay

## 2021-05-23 NOTE — Telephone Encounter (Signed)
Received New start paperwork for ESBRIET. Will update as we work through the benefits process.  

## 2021-05-23 NOTE — Telephone Encounter (Signed)
Submitted Patient Assistance Application to [employer] for ESBRIET along with provider portion, PA and income documents. Will update patient when we receive a response.  Fax# [phone] Phone# [phone] 

## 2021-05-23 NOTE — Telephone Encounter (Signed)
Patient Advocate Encounter ?  ?Received notification that prior authorization for Pirfenidone '267MG'$  tablets is required. ?  ?PA submitted on 05/23/2021 ?Key : [id number] ?Status is pending ?   ? ? ? ? ?

## 2021-05-23 NOTE — Telephone Encounter (Signed)
Patient Advocate Encounter ? ?Prior Authorization for Pirfenidone '267MG'$  tablets has been approved.   ? ?PA# [id number] ?Effective dates: 01/15/2021 through 01/14/2022 ? ?Patients co-pay is $1149.77. (pt has a deductible) ? ? ? ? ? ? ? ? ?

## 2021-06-05 NOTE — Assessment & Plan Note (Signed)
-   Maintained on 2L supplement oxygen

## 2021-06-05 NOTE — Assessment & Plan Note (Signed)
-   Patient has been off oral steroids for two weeks, breathing is no worse. Cough is baseline. Currently on OFEV '150mg'$  twice daily and tolerating. He has an apt with rhaymtology in June. Avoiding exposures to mold/mildrew, gardening. ILD score 8.

## 2021-06-05 NOTE — Telephone Encounter (Signed)
[place] for update on patient's Esbriet application. Received a verbal confirmation from  [place] regarding an approval for [place] patient assistance from 06/05/21 until he no longer meets eligibility criteria or discontinues medication. Rep will plan to call patient today.  Phone# [phone] [facility]: [phone]  Will f/u with patient tomorrow f/u Esbriet counseling.  Knox Saliva, PharmD, MPH, BCPS, CPP Clinical Pharmacist (Rheumatology and Pulmonology)

## 2021-06-05 NOTE — Assessment & Plan Note (Signed)
-   Checking CBC and urine culture

## 2021-06-09 NOTE — Progress Notes (Unsigned)
Office Visit Note  Patient: Dustin Dean             Date of Birth: 13-Jan-1951           MRN: [id number]             PCP: Joneen Boers, MD Referring: Brand Males, MD Visit Date: 06/16/2021 Occupation: @GUAROCC @  Subjective:  No chief complaint on file.   History of Present Illness: Dustin Dean is a 71 y.o. male ***   Activities of Daily Living:  Patient reports morning stiffness for *** {minute/hour:19697}.   Patient {ACTIONS;DENIES/REPORTS:21021675::"Denies"} nocturnal pain.  Difficulty dressing/grooming: {ACTIONS;DENIES/REPORTS:21021675::"Denies"} Difficulty climbing stairs: {ACTIONS;DENIES/REPORTS:21021675::"Denies"} Difficulty getting out of chair: {ACTIONS;DENIES/REPORTS:21021675::"Denies"} Difficulty using hands for taps, buttons, cutlery, and/or writing: {ACTIONS;DENIES/REPORTS:21021675::"Denies"}  No Rheumatology ROS completed.   PMFS History:  Patient Active Problem List   Diagnosis Date Noted   Fever of unknown origin (FUO) 05/02/2021   ILD (interstitial lung disease) ([place]) 03/28/2021   Chronic respiratory failure with hypoxia ([place]) 03/28/2021    Past Medical History:  Diagnosis Date   Aortic stenosis    Flu    Pneumonia    Prostate cancer ([place])    Pulmonary fibrosis ([place])    Skin cancer     No family history on file. Past Surgical History:  Procedure Laterality Date   HERNIA REPAIR     SKIN CANCER EXCISION     Social History   Social History Narrative   Not on file   Immunization History  Administered Date(s) Administered   Fluad Quad(high Dose 65+) 10/23/2017, 09/30/2019, 10/18/2020   Influenza-Unspecified 11/11/2009, 11/08/2011, 11/03/2013, 11/22/2014, 11/10/2015   Moderna Covid-19 Vaccine Bivalent Booster 95yr & up 10/24/2020   Moderna Sars-Covid-2 Vaccination 03/19/2019, 04/16/2019, 11/16/2019, 05/07/2020   Pneumococcal Conjugate-13 12/08/2007, 12/21/2015, 09/04/2018   Pneumococcal Polysaccharide-23 12/08/2007, 06/21/2017,  06/16/2018   Tdap 12/08/2007, 11/27/2018   Zoster Recombinat (Shingrix) 10/30/2013, 09/04/2018, 11/27/2018   Zoster, Live 10/30/2013   Zoster, Unspecified 10/30/2013     Objective: Vital Signs: There were no vitals taken for this visit.   Physical Exam   Musculoskeletal Exam: ***  CDAI Exam: CDAI Score: -- Patient Global: --; Provider Global: -- Swollen: --; Tender: -- Joint Exam 06/16/2021   No joint exam has been documented for this visit   There is currently no information documented on the homunculus. Go to the Rheumatology activity and complete the homunculus joint exam.  Investigation: No additional findings.  Imaging: No results found.  Recent Labs: Lab Results  Component Value Date   WBC 6.2 05/01/2021   HGB 14.9 05/01/2021   PLT 174 05/01/2021   NA 135 05/01/2021   K 3.4 (L) 05/01/2021   CL 101 05/01/2021   CO2 25 05/01/2021   GLUCOSE 136 (H) 05/01/2021   BUN 15 05/01/2021   CREATININE 0.94 05/01/2021   BILITOT 0.4 05/19/2021   ALKPHOS 116 05/19/2021   AST 25 05/19/2021   ALT 18 05/19/2021   PROT 7.0 05/19/2021   ALBUMIN 3.9 05/19/2021   CALCIUM 8.7 (L) 05/01/2021   GFRAA >60 02/17/2016   QFTBGOLDPLUS NEGATIVE 01/27/2021    Speciality Comments: No specialty comments available.  Procedures:  No procedures performed Allergies: Bee venom, Shellfish allergy, Nintedanib, and Pseudoephedrine   Assessment / Plan:     Visit Diagnoses: ILD (interstitial lung disease) ([facility]  Rheumatoid factor positive - 01/27/21: RF 323, anti-CCP<16, ANA negative, dsDNA 1, ESR 31, ACE 49, aldolase 4.1, CK 107, Ro-, La-, antiJo1-, RNP-, Scl-70-  Chronic respiratory failure with hypoxia ([facility])  Orders: No orders of the defined types were placed in this encounter.  No orders of the defined types were placed in this encounter.   Face-to-face time spent with patient was *** minutes. Greater than 50% of time was spent in counseling and coordination of care.  Follow-Up  Instructions: No follow-ups on file.   Ofilia Neas, PA-C  Note - This record has been created using Dragon software.  Chart creation errors have been sought, but may not always  have been located. Such creation errors do not reflect on  the standard of medical care.

## 2021-06-15 ENCOUNTER — Other Ambulatory Visit (HOSPITAL_COMMUNITY): Payer: Self-pay

## 2021-06-15 ENCOUNTER — Telehealth: Payer: Self-pay

## 2021-06-15 DIAGNOSIS — Z5181 Encounter for therapeutic drug level monitoring: Secondary | ICD-10-CM

## 2021-06-15 DIAGNOSIS — J849 Interstitial pulmonary disease, unspecified: Secondary | ICD-10-CM

## 2021-06-15 NOTE — Telephone Encounter (Signed)
Attempted to contact patient regarding pirfenidone counseling. However, patient did not answer and a HIPAA compliant voice mail was left asking patient to contact us at his earliest convince.   Joseph Art, Pharm.D. PGY-1 Pharmacy Resident 06/15/2021 2:13 PM

## 2021-06-16 ENCOUNTER — Ambulatory Visit (INDEPENDENT_AMBULATORY_CARE_PROVIDER_SITE_OTHER): Payer: Medicare Other

## 2021-06-16 ENCOUNTER — Ambulatory Visit: Payer: Self-pay

## 2021-06-16 ENCOUNTER — Encounter: Payer: Self-pay | Admitting: Rheumatology

## 2021-06-16 ENCOUNTER — Ambulatory Visit (INDEPENDENT_AMBULATORY_CARE_PROVIDER_SITE_OTHER): Payer: Medicare Other | Admitting: Rheumatology

## 2021-06-16 VITALS — BP 128/79 | HR 76 | Ht 68.0 in | Wt 214.4 lb

## 2021-06-16 DIAGNOSIS — R053 Chronic cough: Secondary | ICD-10-CM

## 2021-06-16 DIAGNOSIS — M79672 Pain in left foot: Secondary | ICD-10-CM | POA: Diagnosis not present

## 2021-06-16 DIAGNOSIS — M436 Torticollis: Secondary | ICD-10-CM

## 2021-06-16 DIAGNOSIS — M79642 Pain in left hand: Secondary | ICD-10-CM

## 2021-06-16 DIAGNOSIS — M25561 Pain in right knee: Secondary | ICD-10-CM

## 2021-06-16 DIAGNOSIS — M79641 Pain in right hand: Secondary | ICD-10-CM

## 2021-06-16 DIAGNOSIS — M25562 Pain in left knee: Secondary | ICD-10-CM

## 2021-06-16 DIAGNOSIS — J9611 Chronic respiratory failure with hypoxia: Secondary | ICD-10-CM

## 2021-06-16 DIAGNOSIS — Z8669 Personal history of other diseases of the nervous system and sense organs: Secondary | ICD-10-CM

## 2021-06-16 DIAGNOSIS — M79671 Pain in right foot: Secondary | ICD-10-CM | POA: Diagnosis not present

## 2021-06-16 DIAGNOSIS — J849 Interstitial pulmonary disease, unspecified: Secondary | ICD-10-CM

## 2021-06-16 DIAGNOSIS — R768 Other specified abnormal immunological findings in serum: Secondary | ICD-10-CM | POA: Diagnosis not present

## 2021-06-16 DIAGNOSIS — E785 Hyperlipidemia, unspecified: Secondary | ICD-10-CM

## 2021-06-16 DIAGNOSIS — L409 Psoriasis, unspecified: Secondary | ICD-10-CM

## 2021-06-16 DIAGNOSIS — J302 Other seasonal allergic rhinitis: Secondary | ICD-10-CM

## 2021-06-16 DIAGNOSIS — Z8719 Personal history of other diseases of the digestive system: Secondary | ICD-10-CM

## 2021-06-16 DIAGNOSIS — G8929 Other chronic pain: Secondary | ICD-10-CM

## 2021-06-19 MED ORDER — PIRFENIDONE 267 MG PO TABS: 801.0000 mg | ORAL_TABLET | Freq: Three times a day (TID) | ORAL | 4 refills | Status: DC

## 2021-06-19 NOTE — Telephone Encounter (Addendum)
Subjective:  Patient presents today to [facility] to see pharmacy team for Esbriet new start.   Patient was last seen by Dr. Chase Caller on 05/19/2021.  Pertinent past medical history includes ILD and chronic respiratory failure with hypoxia. Prior therapy includes Ofev.  Per patient, he received samples from the clinic and has titrated appropriately based on the package insert. He recently started taking 3 tablets TID. He has not yet completed the on-boarding phone call but will do so today. Agreeable to getting CMET next week in the clinic.   History of elevated LFTs: No History of diarrhea, nausea, vomiting: No  Objective: Allergies  Allergen Reactions   Bee Venom Rash   Shellfish Allergy Hives   Nintedanib Other (See Comments)    SIRS, fevers   Pseudoephedrine Anxiety    Outpatient Encounter Medications as of 06/15/2021  Medication Sig   acetaminophen (TYLENOL) 325 MG tablet Take by mouth as needed.   albuterol (VENTOLIN HFA) 108 (90 Base) MCG/ACT inhaler Inhale into the lungs.   aspirin-acetaminophen-caffeine (EXCEDRIN MIGRAINE) 250-250-65 MG tablet Take by mouth as needed.   Calcium Carb-Cholecalciferol (CALCIUM/VITAMIN D PO) Take 1 tablet by mouth daily.   Cetirizine HCl (ZYRTEC PO) Take by mouth.   fluorouracil (EFUDEX) 5 % cream Apply topically.   fluticasone (FLONASE) 50 MCG/ACT nasal spray Place into both nostrils daily.   gabapentin (NEURONTIN) 300 MG capsule Take 300 mg by mouth 3 (three) times daily.   loperamide (IMODIUM) 2 MG capsule Take by mouth.   meclizine (ANTIVERT) 25 MG tablet Take 25 mg by mouth 2 (two) times daily as needed.   Multiple Vitamin (MULTIVITAMIN) capsule Take 1 capsule by mouth every morning.   naproxen sodium (ALEVE) 220 MG tablet Take 220 mg by mouth daily as needed.   omeprazole (PRILOSEC) 40 MG capsule Take 40 mg by mouth daily.   PAROXETINE HCL PO Take by mouth.   rosuvastatin (CRESTOR) 5 MG tablet Take 1 tablet by mouth daily.    SUMAtriptan (IMITREX) 100 MG tablet TAKE ONE TABLET BY MOUTH AT ONSET OF HEADACHE; MAY REPEAT ONE TABLET IN 2 HOURS IF NEEDED. MAX OF 200 MG PER DAY   No facility-administered encounter medications on file as of 06/15/2021.     Immunization History  Administered Date(s) Administered   Fluad Quad(high Dose 65+) 10/23/2017, 09/30/2019, 10/18/2020   Influenza-Unspecified 11/11/2009, 11/08/2011, 11/03/2013, 11/22/2014, 11/10/2015   Moderna Covid-19 Vaccine Bivalent Booster 87yr & up 10/24/2020   Moderna Sars-Covid-2 Vaccination 03/19/2019, 04/16/2019, 11/16/2019, 05/07/2020   Pneumococcal Conjugate-13 12/08/2007, 12/21/2015, 09/04/2018   Pneumococcal Polysaccharide-23 12/08/2007, 06/21/2017, 06/16/2018   Tdap 12/08/2007, 11/27/2018   Zoster Recombinat (Shingrix) 10/30/2013, 09/04/2018, 11/27/2018   Zoster, Live 10/30/2013   Zoster, Unspecified 10/30/2013    PFT's TLC  Date Value Ref Range Status  02/21/2021 3.76 L Final    CMP     Component Value Date/Time   NA 135 05/01/2021 1955   K 3.4 (L) 05/01/2021 1955   CL 101 05/01/2021 1955   CO2 25 05/01/2021 1955   GLUCOSE 136 (H) 05/01/2021 1955   BUN 15 05/01/2021 1955   CREATININE 0.94 05/01/2021 1955   CALCIUM 8.7 (L) 05/01/2021 1955   PROT 7.0 05/19/2021 1246   ALBUMIN 3.9 05/19/2021 1246   AST 25 05/19/2021 1246   ALT 18 05/19/2021 1246   ALKPHOS 116 05/19/2021 1246   BILITOT 0.4 05/19/2021 1246   GFRNONAA >60 05/01/2021 1955   GFRAA >60 02/17/2016 1920    CBC    Component  Value Date/Time   WBC 6.2 05/01/2021 1955   RBC 5.29 05/01/2021 1955   HGB 14.9 05/01/2021 1955   HCT 45.1 05/01/2021 1955   PLT 174 05/01/2021 1955   MCV 85.3 05/01/2021 1955   MCH 28.2 05/01/2021 1955   MCHC 33.0 05/01/2021 1955   RDW 16.1 (H) 05/01/2021 1955   LYMPHSABS 0.6 (L) 05/01/2021 1955   MONOABS 0.3 05/01/2021 1955   EOSABS 0.2 05/01/2021 1955   BASOSABS 0.0 05/01/2021 1955   LFT's    Latest Ref Rng & Units 05/19/2021   12:46 PM  05/01/2021    7:55 PM  Hepatic Function  Total Protein 6.0 - 8.3 g/dL 7.0   7.4    Albumin 3.5 - 5.2 g/dL 3.9   3.8    AST 0 - 37 U/L 25   57    ALT 0 - 53 U/L 18   49    Alk Phosphatase 39 - 117 U/L 116   161    Total Bilirubin 0.2 - 1.2 mg/dL 0.4   0.9    Bilirubin, Direct 0.0 - 0.3 mg/dL 0.1         HRCT (02/13/2021) Stable examination again demonstrating extensive fibrotic interstitial lung disease, with a spectrum of findings which is once again categorized as most compatible with an alternative diagnosis (not usual interstitial pneumonia) per current ATS guidelines, 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  Assessment and Plan  Esbriet Medication Management Thoroughly counseled patient on the efficacy, mechanism of action, dosing, administration, adverse effects, and monitoring parameters of Esbriet. Patient verbalized understanding.   Goals of Therapy: Will not stop or reverse the progression of ILD. It will slow the progression of ILD.   Dosing: Starting dose completed (Esbriet 267 mg 1 tablet three times daily for 7 days, then 2 tablets three times daily for 7 days, then 3 tablets three times daily). He will continue 3 tabs (801 mg) three times daily if tolerated. Stressed the importance of taking with meals and space at least 5-6 hours apart to minimize stomach upset.   Adverse Effects: Nausea, vomiting, diarrhea, weight loss Some nausea, not every day  Abdominal pain GERD Patient on omeprazole, no issues at this time Sun sensitivity/rash - patient advised to wear sunscreen when exposed to sunlight History of skin cancer, sees dermatologist  Dizziness Fatigue  Monitoring: Monitor for diarrhea, nausea and vomiting, GI perforation, hepatotoxicity  Monitor LFTs - baseline, monthly for first 6 months, then every 3 months routinely LFTs on 05/19/21 reviewed and WNL  CBC w differential at baseline and every 3 months routinely CBC on 05/01/21 reviewed  and WNL   Access: Approval of Esbriet through: patient assistance Rx sent to: [place] [facility], dispensing) for [place]: [phone] Patient plans to call today to complete onboarding and set up shipment   Medication Reconciliation A drug regimen assessment was performed, including review of allergies, interactions, disease-state management, dosing and immunization history. Medications were reviewed with the patient, including name, instructions, indication, goals of therapy, potential side effects, importance of adherence, and safe use.  Immunizations Patient is up to date for the influenzae, pneumonia, and shingles vaccinations.  Patient has received 5 COVID19 vaccines.  This appointment required 30 minutes of patient care (this includes precharting, chart review, review of results, face-to-face care, etc.).  Thank you for involving pharmacy to assist in providing this patient's care.   Joseph Art, Pharm.D. PGY-1 Pharmacy Resident 06/19/2021 2:29 PM

## 2021-06-19 NOTE — Addendum Note (Signed)
Addended by: Cassandria Anger on: 06/19/2021 02:25 PM   Modules accepted: Orders

## 2021-06-19 NOTE — Addendum Note (Signed)
Addended by: Pauletta Browns on: 06/19/2021 02:40 PM   Modules accepted: Orders

## 2021-06-25 NOTE — Progress Notes (Deleted)
Office Visit Note  Patient: Dustin Dean             Date of Birth: 1950/07/31           MRN: [id number]             PCP: Joneen Boers, MD Referring: Joneen Boers, MD Visit Date: 07/07/2021 Occupation: '@GUAROCC'$ @  Subjective:  No chief complaint on file.   History of Present Illness: Dustin Dean is a 71 y.o. male ***   Activities of Daily Living:  Patient reports morning stiffness for *** {minute/hour:19697}.   Patient {ACTIONS;DENIES/REPORTS:21021675::"Denies"} nocturnal pain.  Difficulty dressing/grooming: {ACTIONS;DENIES/REPORTS:21021675::"Denies"} Difficulty climbing stairs: {ACTIONS;DENIES/REPORTS:21021675::"Denies"} Difficulty getting out of chair: {ACTIONS;DENIES/REPORTS:21021675::"Denies"} Difficulty using hands for taps, buttons, cutlery, and/or writing: {ACTIONS;DENIES/REPORTS:21021675::"Denies"}  No Rheumatology ROS completed.   PMFS History:  Patient Active Problem List   Diagnosis Date Noted   Fever of unknown origin (FUO) 05/02/2021   ILD (interstitial lung disease) ([place]) 03/28/2021   Chronic respiratory failure with hypoxia ([place]) 03/28/2021    Past Medical History:  Diagnosis Date   Aortic stenosis    Flu    Pneumonia    Prostate cancer ([place])    Pulmonary fibrosis ([place])    Skin cancer     Family History  Problem Relation Age of Onset   Cancer Mother    Congestive Heart Failure Father    Prostate cancer Father    Healthy Sister    Past Surgical History:  Procedure Laterality Date   HERNIA REPAIR     NASAL SEPTUM SURGERY     SKIN CANCER EXCISION     Social History   Social History Narrative   Not on file   Immunization History  Administered Date(s) Administered   Fluad Quad(high Dose 65+) 10/23/2017, 09/30/2019, 10/18/2020   Influenza-Unspecified 11/11/2009, 11/08/2011, 11/03/2013, 11/22/2014, 11/10/2015   Moderna Covid-19 Vaccine Bivalent Booster 19yr & up 10/24/2020   Moderna Sars-Covid-2 Vaccination 03/19/2019, 04/16/2019,  11/16/2019, 05/07/2020   Pneumococcal Conjugate-13 12/08/2007, 12/21/2015, 09/04/2018   Pneumococcal Polysaccharide-23 12/08/2007, 06/21/2017, 06/16/2018   Tdap 12/08/2007, 11/27/2018   Zoster Recombinat (Shingrix) 10/30/2013, 09/04/2018, 11/27/2018   Zoster, Live 10/30/2013   Zoster, Unspecified 10/30/2013     Objective: Vital Signs: There were no vitals taken for this visit.   Physical Exam   Musculoskeletal Exam: ***  CDAI Exam: CDAI Score: -- Patient Global: --; Provider Global: -- Swollen: --; Tender: -- Joint Exam 07/07/2021   No joint exam has been documented for this visit   There is currently no information documented on the homunculus. Go to the Rheumatology activity and complete the homunculus joint exam.  Investigation: No additional findings.  Imaging: XR Foot 2 Views Left  Result Date: 06/16/2021 First MTP, PIP and DIP narrowing was noted.  A spacer was noted in the metatarsal tarsal joint.  Screw was noted in the calcaneus.  Dorsal spurring was noted.  Subtalar joint space narrowing was noted. Impression: These findings are consistent with osteoarthritis and postsurgical changes.  XR Foot 2 Views Right  Result Date: 06/16/2021 First MTP, PIP and DIP narrowing was noted.  No intertarsal, tibiotalar or subtalar joint space narrowing was noted.  Inferior calcaneal spur was noted. Impression: These findings are consistent with osteoarthritis of the foot.  XR KNEE 3 VIEW LEFT  Result Date: 06/16/2021 Severe medial compartment narrowing was noted.  Moderate patellofemoral narrowing was noted.  No chondrocalcinosis was noted. 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  XR KNEE 3 VIEW RIGHT  Result Date: 06/16/2021 No medial or lateral compartment narrowing was noted.  Mild patellofemoral narrowing was noted.  No chondrocalcinosis was noted. Impression: These findings are consistent with chondromalacia  patella of the knee.  XR Hand 2 View Left  Result Date: 06/16/2021 CMC narrowing and subluxation was noted.  PIP and DIP narrowing was noted.  No MCP or intercarpal or radiocarpal joint space narrowing was noted.  No erosive changes were noted. Impression: These findings are consistent with osteoarthritis of the hand.  XR Hand 2 View Right  Result Date: 06/16/2021 CMC, PIP and DIP narrowing was noted.  No MCP, intercarpal or radiocarpal joint space narrowing was noted.  No erosive changes were noted. Impression: These findings are consistent with osteoarthritis of the hand.  XR Cervical Spine 2 or 3 views  Result Date: 06/16/2021 Multilevel spondylosis with anterior osteophytes was noted.  Most significant disc space was noted between C5-C6 and C6-C7. Impression: These findings are consistent with multilevel spondylosis and facet joint arthropathy.   Recent Labs: Lab Results  Component Value Date   WBC 6.2 05/01/2021   HGB 14.9 05/01/2021   PLT 174 05/01/2021   NA 135 05/01/2021   K 3.4 (L) 05/01/2021   CL 101 05/01/2021   CO2 25 05/01/2021   GLUCOSE 136 (H) 05/01/2021   BUN 15 05/01/2021   CREATININE 0.94 05/01/2021   BILITOT 0.4 05/19/2021   ALKPHOS 116 05/19/2021   AST 25 05/19/2021   ALT 18 05/19/2021   PROT 7.0 05/19/2021   ALBUMIN 3.9 05/19/2021   CALCIUM 8.7 (L) 05/01/2021   GFRAA >60 02/17/2016   QFTBGOLDPLUS NEGATIVE 01/27/2021    Speciality Comments: No specialty comments available.  Procedures:  No procedures performed Allergies: Bee venom, Shellfish allergy, Nintedanib, and Pseudoephedrine   Assessment / Plan:     Visit Diagnoses: No diagnosis found.  Orders: No orders of the defined types were placed in this encounter.  No orders of the defined types were placed in this encounter.   Face-to-face time spent with patient was *** minutes. Greater than 50% of time was spent in counseling and coordination of care.  Follow-Up Instructions: No follow-ups on  file.   Bo Merino, MD  Note - This record has been created using Editor, commissioning.  Chart creation errors have been sought, but may not always  have been located. Such creation errors do not reflect on  the standard of medical care.

## 2021-07-03 ENCOUNTER — Encounter: Payer: Self-pay | Admitting: Rheumatology

## 2021-07-03 ENCOUNTER — Telehealth (INDEPENDENT_AMBULATORY_CARE_PROVIDER_SITE_OTHER): Payer: Medicare Other | Admitting: Adult Health

## 2021-07-03 ENCOUNTER — Encounter: Payer: Self-pay | Admitting: Adult Health

## 2021-07-03 DIAGNOSIS — J849 Interstitial pulmonary disease, unspecified: Secondary | ICD-10-CM

## 2021-07-03 DIAGNOSIS — Z5181 Encounter for therapeutic drug level monitoring: Secondary | ICD-10-CM | POA: Diagnosis not present

## 2021-07-03 DIAGNOSIS — J9611 Chronic respiratory failure with hypoxia: Secondary | ICD-10-CM | POA: Diagnosis not present

## 2021-07-03 NOTE — Addendum Note (Signed)
Addended by: Vanessa Barbara on: 07/03/2021 10:19 AM   Modules accepted: Orders

## 2021-07-03 NOTE — Progress Notes (Addendum)
Virtual Visit via Video Note  I connected with Donnivan Villena on 07/03/21 at  9:00 AM EDT by a video enabled telemedicine application and verified that I am speaking with the correct person using two identifiers.  Location: Patient: Home  Provider: Office    I discussed the limitations of evaluation and management by telemedicine and the availability of in person appointments. The patient expressed understanding and agreed to proceed.  provider:   History of Present Illness:  71 year old male followed for interstitial lung disease with progressive phenotype (diagnosed in 2019) Seen for pulmonary consult to establish for ILD in January 24, 2021.  Has chronic respiratory failure on oxygen with activity and at bedtime Medical history significant for aortic valve stenosis followed by cardiology at [facility].  History of prostate cancer  TEST/EVENTS :  December 2021 echocardiogram EF 55 to 60% with tricuspid aortic valve and moderate stenosis normal right ventricle     High-resolution CT scan of the chest August 2020  -There is a report of honeycombing but without any craniocaudal gradient.  Air-trapping reported alternative pattern more suggestive of chronic hypersensitive pneumonitis.  But there is already progression from August 2019   Fibrotic interstitial lung disease with evidence of progression when  compared with August 29, 2018 prior exam. Favor fibrotic  hypersensitivity pneumonitis given presence of air trapping.  Findings are suggestive of an alternative diagnosis (not UIP     9 mm right lower lobe nodule seen in October 2022 for the first time: Stable January 2023   CT Chest data January 2023    Mediastinum/Nodes: Multiple prominent borderline but nonenlarged mediastinal and bilateral hilar lymph nodes, similar to the prior study esophagus is unremarkable in appearance. No axillary lymphadenopathy.   Lungs/Pleura: High-resolution images again demonstrate  widespread but patchy areas of ground-glass attenuation, septal thickening, subpleural reticulation, thickening of the peribronchovascular interstitium, cylindrical and varicose traction bronchiectasis, peripheral bronchiolectasis and extensive honeycombing. There is no discernible craniocaudal gradient. The most extensive areas of honeycombing are anterior lung, predominantly in the upper lobes and right middle lobe. Some areas of the lung bases demonstrate relative sparing. Inspiratory and expiratory imaging demonstrates mild to moderate air trapping indicative of small airways disease. No definite progression compared to the recent prior study. No acute consolidative airspace disease. No pleural effusions. Previously described nodular area of architectural distortion in the right lower lobe (axial image 106 of series 8) is stable measuring 8 mm on today's examination, likely part of the underlying fibrosis. No other definite suspicious appearing pulmonary nodules or masses are noted.   Upper Abdomen: Unremarkable.   Musculoskeletal: There are no aggressive appearing lytic or blastic lesions noted in the visualized portions of the skeleton.   IMPRESSION: 1. 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 2. Previously noted nodular area of architectural distortion in the right lower lobe is unchanged, likely part of the fibrosis rather than a pulmonary nodule. 3. There are calcifications of the aortic valve and mitral annulus. Echocardiographic correlation for evaluation of potential valvular dysfunction may be warranted if clinically indicated. 4. Aortic atherosclerosis, in addition to 2 vessel coronary  artery disease. Please note that although the presence of coronary artery calcium documents the presence of coronary artery disease, the severity of this disease and any potential stenosis cannot be assessed on  this non-gated CT examination. Assessment for potential risk factor modification, dietary therapy or pharmacologic therapy may be warranted, if clinically indicated.   Aortic Atherosclerosis (ICD10-I70.0).  07/03/2021 Follow up : ILD  Patient returns for a 6-week follow-up.  Patient has underlying interstitial lung disease with progressive phenotype.  Serial CT chest from August 20 to August 2022 showed progressive changes.  Serology was negative except for positive rheumatoid factor.  Rheumatology consult is pending.  Previous PFTs have showed significant restriction and decreased diffusing capacity.  Patient was started on Ofev March 01, 2021.  Patient had a SIRS like response to OFEV x 2 .  Ofev has been stopped indefinitely and advised not to restart.  Patient was placed on a drug holiday.  Started on Esbriet 1 month ago.  Patient says since starting Esbriet he seems to be tolerating very well.  LFTs prior to start had returned back to normal.  Previously were elevated.  Patient says he has had minimal nausea.  No diarrhea no upset stomach and appetite seems to be normal.  Patient says he has been using sunscreen when he is out side.  Has had no rash.  We discussed returning for ongoing labs. Patient has been referred to pulmonary rehab but has not contacted today. Patient says overall he is doing okay.  Wears his oxygen with activity as needed.  And at bedtime.  Has had no increased oxygen demands. Patient says he still gets short of breath with heavy activities.  But feels like he is stable at this point.  Has had no return of fever.   Observations/Objective: Appears well in nad   Allergies  Allergen Reactions   Bee Venom Rash   Shellfish Allergy Hives   Nintedanib Other (See  Comments)    SIRS, fevers   Pseudoephedrine Anxiety    Immunization History  Administered Date(s) Administered   Fluad Quad(high Dose 65+) 10/23/2017, 09/30/2019, 10/14/2020, 10/18/2020   Influenza-Unspecified 11/11/2009, 11/08/2011, 11/03/2013, 11/22/2014, 11/10/2015   Moderna Covid-19 Vaccine Bivalent Booster 54yr & up 10/24/2020   Moderna Sars-Covid-2 Vaccination 03/19/2019, 04/16/2019, 11/16/2019, 05/07/2020   Pneumococcal Conjugate-13 12/08/2007, 12/21/2015, 09/04/2018   Pneumococcal Polysaccharide-23 12/08/2007, 06/21/2017, 06/16/2018   Tdap 12/08/2007, 11/27/2018   Zoster Recombinat (Shingrix) 10/30/2013, 09/04/2018, 11/27/2018   Zoster, Live 10/30/2013   Zoster, Unspecified 10/30/2013    Past Medical History:  Diagnosis Date   Aortic stenosis    Flu    Pneumonia    [place]state cancer [place]    Pulmonary fibrosis ([place]    Skin cancer     Tobacco History: Social History   Tobacco Use  Smoking Status Never   Passive exposure: Past  Smokeless Tobacco Never   Counseling given: Not Answered   Outpatient Medications Prior to Visit  Medication Sig Dispense Refill   acetaminophen (TYLENOL) 325 MG tablet Take by mouth as needed.     albuterol (VENTOLIN HFA) 108 (90 Base) MCG/ACT inhaler Inhale into the lungs.     aspirin-acetaminophen-caffeine (EXCEDRIN MIGRAINE) 250-250-65 MG tablet Take by mouth as needed.     Calcium Carb-Cholecalciferol (CALCIUM/VITAMIN D PO) Take 1 tablet by mouth daily.     Cetirizine HCl (ZYRTEC PO) Take by mouth.     fluorouracil (EFUDEX) 5 % cream Apply topically.     fluticasone (FLONASE) 50 MCG/ACT nasal spray Place into both nostrils daily.     gabapentin (NEURONTIN) 300 MG capsule Take 300 mg by mouth 3 (three) times daily.     loperamide (IMODIUM) 2 MG capsule Take by  mouth.     meclizine (ANTIVERT) 25 MG tablet Take 25 mg by mouth 2 (two) times daily as needed.     Multiple Vitamin (MULTIVITAMIN) capsule Take 1 capsule by mouth every  morning.     naproxen sodium (ALEVE) 220 MG tablet Take 220 mg by mouth daily as needed.     omeprazole (PRILOSEC) 40 MG capsule Take 40 mg by mouth daily.     PAROXETINE HCL PO Take by mouth.     Pirfenidone (ESBRIET) 267 MG TABS Take 3 tablets (801 mg total) by mouth 3 (three) times daily with meals. 270 tablet 4   rosuvastatin (CRESTOR) 5 MG tablet Take 1 tablet by mouth daily.     SUMAtriptan (IMITREX) 100 MG tablet TAKE ONE TABLET BY MOUTH AT ONSET OF HEADACHE; MAY REPEAT ONE TABLET IN 2 HOURS IF NEEDED. MAX OF 200 MG PER DAY     No facility-administered medications prior to visit.      Assessment and Plan: ILD -progressive phenotype. Rheumatology referral is pending.  Pulmonary rehab is pending. Unable to tolerate Ofev.  Avoid use going forward. Appears to be tolerating Esbriet.  Was started around 4 to 5 weeks ago.  LFTs are pending Patient education on Esbriet. PFTs on return  Late add : seen by Rheumatology 06/16/21 , felt joint pain were c/w osteoarthritis..  Oxygen dependent respiratory failure.  Patient is continue on oxygen to maintain O2 saturation greater than 88 to 90%.  Chronic cough.  Continue on gabapentin.  Delsym as needed.  Says that Lavella Lemons has not helped him at all in the past.  Plan  Patient Instructions  Return for lab work.  Continue on Oxygen 2l/m with activity and At bedtime  . Goal is for O2 sats >88-90%.  Activity as tolerated.  Delsym 2 tsp Twice daily  for cough as needed  Sips of water to help cough  Continue on Esbriet.  Refer to pulmonary rehab. -we will check on status  Follow up with Dr. Chase Caller in 2 months  as planned  Please contact office for sooner follow up if symptoms do not improve or worsen or seek emergency care       Follow Up Instructions: Follow-up in 2 months as planned with Dr. Chase Caller and as needed   I discussed the assessment and treatment plan with the patient. The patient was provided an opportunity to ask  questions and all were answered. The patient agreed with the plan and demonstrated an understanding of the instructions.   The patient was advised to call back or seek an in-person evaluation if the symptoms worsen or if the condition fails to improve as anticipated.  I provided 30  minutes of non-face-to-face time during this encounter.     Rexene Edison, NP 07/03/2021

## 2021-07-03 NOTE — Patient Instructions (Addendum)
Return for lab work.  Continue on Oxygen 2l/m with activity and At bedtime  . Goal is for O2 sats >88-90%.  Activity as tolerated.  Delsym 2 tsp Twice daily  for cough as needed  Sips of water to help cough  Continue on Esbriet.  Refer to pulmonary rehab. -we will check on status  Follow up with Dr. Chase Caller in 2 months  as planned  Please contact office for sooner follow up if symptoms do not improve or worsen or seek emergency care

## 2021-07-04 ENCOUNTER — Encounter: Payer: Self-pay | Admitting: Internal Medicine

## 2021-07-07 ENCOUNTER — Ambulatory Visit: Payer: BLUE CROSS/BLUE SHIELD | Admitting: Rheumatology

## 2021-07-07 DIAGNOSIS — J302 Other seasonal allergic rhinitis: Secondary | ICD-10-CM

## 2021-07-07 DIAGNOSIS — L409 Psoriasis, unspecified: Secondary | ICD-10-CM

## 2021-07-07 DIAGNOSIS — M5030 Other cervical disc degeneration, unspecified cervical region: Secondary | ICD-10-CM

## 2021-07-07 DIAGNOSIS — R768 Other specified abnormal immunological findings in serum: Secondary | ICD-10-CM

## 2021-07-07 DIAGNOSIS — Z8669 Personal history of other diseases of the nervous system and sense organs: Secondary | ICD-10-CM

## 2021-07-07 DIAGNOSIS — Z8719 Personal history of other diseases of the digestive system: Secondary | ICD-10-CM

## 2021-07-07 DIAGNOSIS — J849 Interstitial pulmonary disease, unspecified: Secondary | ICD-10-CM

## 2021-07-07 DIAGNOSIS — G8929 Other chronic pain: Secondary | ICD-10-CM

## 2021-07-07 DIAGNOSIS — M19072 Primary osteoarthritis, left ankle and foot: Secondary | ICD-10-CM

## 2021-07-07 DIAGNOSIS — E785 Hyperlipidemia, unspecified: Secondary | ICD-10-CM

## 2021-07-07 DIAGNOSIS — J9611 Chronic respiratory failure with hypoxia: Secondary | ICD-10-CM

## 2021-07-07 DIAGNOSIS — R053 Chronic cough: Secondary | ICD-10-CM

## 2021-07-07 DIAGNOSIS — M19042 Primary osteoarthritis, left hand: Secondary | ICD-10-CM

## 2021-07-07 NOTE — Telephone Encounter (Signed)
That is fine not to see rheumatology anymore.  I will see him on September 08, 2021 for his routine office visit.  His last pulmonary function test was May 19, 2021.  Please make sure that he has spirometry and DLCO at the time of the August visit       Latest Ref Rng & Units 05/19/2021   10:56 AM 02/21/2021    4:04 PM  PFT Results  FVC-Pre L 1.95  1.82   FVC-Predicted Pre % 46  43   FVC-Post L  1.95   FVC-Predicted Post %  46   Pre FEV1/FVC % % 90  88   Post FEV1/FCV % %  91   FEV1-Pre L 1.75  1.60   FEV1-Predicted Pre % 56  51   FEV1-Post L  1.76   DLCO uncorrected ml/min/mmHg 19.56  15.57   DLCO UNC% % 79  63   DLCO corrected ml/min/mmHg 19.56  15.57   DLCO COR %Predicted % 79  63   DLVA Predicted % 116  106   TLC L  3.76   TLC % Predicted %  56   RV % Predicted %  75

## 2021-07-11 ENCOUNTER — Encounter (HOSPITAL_COMMUNITY): Payer: Self-pay

## 2021-07-12 ENCOUNTER — Encounter (HOSPITAL_COMMUNITY): Payer: Self-pay | Admitting: *Deleted

## 2021-07-12 NOTE — Progress Notes (Signed)
Received referral from Dr. Chase Caller for this patient to participate in pulmonary rehab with the diagnosis of ILD /pulmonary fibrosis. Clinical review of pt follow up appointment on 6/22 Pulmonary office note.Pt is appropriate for scheduling for Pulmonary Rehab. Will forward to support staff for scheduling and verification of insurance eligibility/benefits with pt's consent. Rodney Langton RN Pulmonary Rehab nurse

## 2021-08-11 ENCOUNTER — Encounter (HOSPITAL_COMMUNITY): Payer: Self-pay

## 2021-08-14 ENCOUNTER — Encounter (HOSPITAL_COMMUNITY): Payer: Self-pay

## 2021-09-08 ENCOUNTER — Encounter: Payer: Self-pay | Admitting: Internal Medicine

## 2021-09-08 ENCOUNTER — Ambulatory Visit (INDEPENDENT_AMBULATORY_CARE_PROVIDER_SITE_OTHER): Payer: Medicare Other | Admitting: Internal Medicine

## 2021-09-08 VITALS — BP 126/80 | HR 84 | Ht 68.0 in | Wt 209.8 lb

## 2021-09-08 DIAGNOSIS — E66811 Obesity, class 1: Secondary | ICD-10-CM

## 2021-09-08 DIAGNOSIS — Z7189 Other specified counseling: Secondary | ICD-10-CM

## 2021-09-08 DIAGNOSIS — Z5181 Encounter for therapeutic drug level monitoring: Secondary | ICD-10-CM | POA: Diagnosis not present

## 2021-09-08 DIAGNOSIS — E669 Obesity, unspecified: Secondary | ICD-10-CM

## 2021-09-08 DIAGNOSIS — R053 Chronic cough: Secondary | ICD-10-CM

## 2021-09-08 DIAGNOSIS — Z01811 Encounter for preprocedural respiratory examination: Secondary | ICD-10-CM

## 2021-09-08 DIAGNOSIS — J849 Interstitial pulmonary disease, unspecified: Secondary | ICD-10-CM

## 2021-09-08 LAB — HEPATIC FUNCTION PANEL
ALT: 12 U/L (ref 0–53)
AST: 17 U/L (ref 0–37)
Albumin: 4 g/dL (ref 3.5–5.2)
Alkaline Phosphatase: 111 U/L (ref 39–117)
Bilirubin, Direct: 0.1 mg/dL (ref 0.0–0.3)
Total Bilirubin: 0.3 mg/dL (ref 0.2–1.2)
Total Protein: 7.1 g/dL (ref 6.0–8.3)

## 2021-09-08 NOTE — Progress Notes (Signed)
Cardiology Dr. Loni Beckwith 02/15/20 Dustin Dean is 71 y.o. year old male with a history of No prior cardiovascular disease who presents to [place] cardiology for the evaluation of a heart murmur. This patient is a non-smoker and denies a previous history of myocardial infarction, congestive heart failure, or valvular heart disease. 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 He really has little in the way of exertional chest symptoms. Occasionally according to the wife if he walks up an incline briskly he may have some minor shortness of breath but remains active and has little in the way limitation. He denies a previous history of myocardial infarction, congestive heart failure, or known valvular heart disease. At a recent examination he was found to have a heart murmur and an echocardiogram was performed. It demonstrated a peak gradient across his aortic valve of 30 mmHg and an aortic valve area of 1.1 cm. This was read as being consistent with moderate aortic stenosis. I had a fairly significant discussion with the patient and his wife about the implications of his aortic valve and the need to follow the aortic stenosis progression over time.  11/14/2020 office visit with [name] pulmonary physician assistant at [facility] has past medical history of restrictive lung disease, interstitial lung disease, allergic rhinitis, laryngeal pharyngeal reflux, arthritis, lung nodules and persistent cough. He is a non-smoker and has never smoked. He has medication allergy to pseudoephedrine.  Mr. [name] presents today with complaint of a progressively worsening shortness of breath and dyspnea on exertion over the past several months. We have been following his interstitial lung disease since 2018. He has been stable without complaint of dyspnea on exertion over the past several years. He indicates that recently he has had more shortness  of breath with exertion. He and his wife will walk the dog in their usual route through the neighborhood that they have done for many years. He reports that he will get short of breath along the root in a way that he has never been bothered with previously. He has recently requested an albuterol inhaler and reports that it has been helpful. He has been using Symbicort 160/4.5 twice daily but has been having difficulty using it successfully as he has been coughing significantly and loses a lot of the medication. A recent chest CT conducted 10/24/2020 notes a progression in the interstitial changes. A 9 mm right lower lobe nodule is also noted. The plan is to have a 29-monthfollow-up chest CT conducted 02/04/2021. A 6-minute walk test conducted today shows oxygen desaturation and need for supplemental oxygen. I will place the order for supplemental oxygen. [facility]   Patient at rest on RA with baseline sats : 97% After exertion for 3 min on RA pt's sats were : 86% Patient placed on oxygen via nasal cannula at 2 lpm flow via portable oxygen concentrator device Exertion for 6 mins with oxygen confirmed final sats at 94%   #1 lung nodules Chest CT 10/24/2020 shows 9 mm right lower lobe lung nodule Follow-up chest CT scheduled for 375-montheevaluation in January 2023  2. Interstitial lung disease Chest CT 11/04/2020 notes fibrotic interstitial lung disease with evidence of progression compared to 08/28/2020 [facility]   #Primary  Care dec 2022 Osteopenia Due for DXA. Prostate cancer Saw Dr. Louis Meckel at [facility]. Biopsy + for cancer. They are just watching it. He is going to have MRI and another biopsy next visit.   OV 01/24/2021 -evaluation and transfer of care to Dr. Chase Caller in ILD center in [place].  Referred by  Marlane Mingle patient support group leader for the pulmonary fibrosis foundation support group  Subjective:  Patient ID: Dustin Dean, male , DOB: Apr 14, 1950 , age 29 y.o. , MRN: [id number] , ADDRESS: [place] [id number] PCP Joneen Boers, MD Patient Care Team: Joneen Boers, MD as PCP - General (Family Medicine)  This Provider for this visit: Treatment Team:  Attending Provider: Brand Males, MD    01/24/2021 -   Chief Complaint  Patient presents with   Consult    Pt is here for a switch of providers to take over care for his pulmonary fibrosis. 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 Pt does become SOB with activities and also states that he does have a chronic cough.     HPI  [name] 71 y.o. -new evaluation for interstitial lung disease.  History is provided by review of the chart and also talking to the patient and his wife.  He tells me that he has had chronic cough since his teenage years.  But for the last few to several years its been more consistent.  He was seeing the physician assistant at [facility].  Somewhere along the way he got referred to Dr. Dennison Mascot right at [facility] some 3 years ago.  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  After that cough is largely improved except for occasional exacerbations in the fall in the winter.  Then in 2018 sometime after he went on gabapentin he was informed by the physician assistant that he might have early ILD.  He does not recollect any serology or biopsy being done.  He does recollect a few pulmonary function test.  Then all of a sudden starting September 2022 his cough "got out of hand" and also developed worsening shortness of breath.  This the first time he started developing worsening shortness of breath.  Then by November 2022 started needing exertional oxygen leading to current symptoms.  He was referred to [facility] but he preferred to establish with the  ILD center here in [place] with [place] based on [facility] recommendation.  He found Marlane Mingle through Internet search for pulmonary fibrosis foundation.  His current symptom score is below.  He is not on any antifibrotic.  [facility] Integrated Comprehensive ILD Questionnaire  Symptoms:  SYMPTOM SCALE - ILD 01/24/2021  Current weight   O2 use 2L [place] with ex  Shortness of Breath 0 -> 5 scale with 5 being worst (score 6 If unable to do)  At rest 0  Simple tasks - showers, clothes change, eating, shaving 1  Household (dishes, doing bed, laundry) 1  Shopping 2  Walking level at own pace 5  Walking up Stairs 5  Total (30-36) Dyspnea Score 14  How bad is your cough? 2  How bad is your fatigue 1  How bad is nausea 00000  How bad is vomiting?  0  How bad is diarrhea? 0  How bad is anxiety? 0  How bad is depression 0  Any chronic pain - if so where and how bad 0       Past Medical  History :  -He has obesity BMI 33-34 - Denies any collagen vascular disease - He is known to have aortic stenosis -sees cardiologist at [facility].  December 2021 echocardiogram EF 55 to 60% with tricuspid aortic valve and moderate stenosis normal right ventricle -Denies any asthma or COPD.  Denies any heart failure denies any collagen vascular disease -Denies snoring or excessive daytime somnolence.  Denies stroke.  Denies formal diagnosis of sleep apnea denies formal diagnosis of pulmonary hypertension [echo a year ago did not show pulmonary hypertension] -Has had vaccination against COVID but has not had COVID disease -Recent diagnosis of late 2022 early stage prostate cancer at [facility] on observation treatment -Has history of skin cancer for which he apply sunscreen regularly  ROS:  -Shortness of breath present Cough chronic plus -Has intermittent chronic diarrhea and takes Imodium but no other GI side effects -Possible Raynaud's for the last several decades  FAMILY HISTORY of LUNG  DISEASE:  Denies family for pulmonary fibrosis.  Denies family Struve COPD or asthma sarcoid or cystic fibrosis.  Denies family history of hypersensitive pneumonitis.  Denies autoimmune disease.  Denies any premature graying of the hair or albinism  PERSONAL EXPOSURE HISTORY:  No prior cigarette smoking -No marijuana use of cocaine use no intravenous drug use.  HOME  EXPOSURE and HOBBY DETAILS :  -Single-family home in a one-story space.  [place] setting age of the current home is 31 years.  He is lived there for 6 years.  He does use a feather pillow and this occasionally mold or mildew in the bathroom.  All his life he is done active gardening with mulch and woodchips and occasionally damp soil.  He is worked out in the yard quite a bit and does gardening.  He enjoys the gardening.  OCCUPATIONAL HISTORY (122 questions) : -He worked in the [employer] at [employer] and then [employer] no [employer].  He does not remember if the buildings had mold in it.  He does not think so -Detail greater than 100 question organic and inorganic antigen history exposure is negative  PULMONARY TOXICITY HISTORY (27 items):  Denies  INVESTIGATIONS: Report of pulmonary function test but I am not able to get it in Care Everywhere     Simple office walk 185 feet x  3 laps goal with forehead probe 01/24/2021    O2 used Ra at rest, desat at 1 lap and then walked with 2L piulse   Number laps completed 1 lap on RA, then 3 laps on 2L   Comments about pace x   Resting Pulse Ox/HR 96% and 88/min at 1 lap   Final Pulse Ox/HR 92% and 115/min on 3 laps on 2L pulsed   Desaturated </= 88% no   Desaturated <= 3% points yes   Got Tachycardic >/= 90/min yes   Symptoms at end of test Modreate dyspnea   Miscellaneous comments Corrected with 2L [place]      High-resolution CT scan of the chest August 2020  -There is a report of honeycombing but without any craniocaudal gradient.  Air-trapping  reported alternative pattern more suggestive of chronic hypersensitive pneumonitis.  But there is already progression from August 2019 -Read by Dr. Vinnie Langton   CT Chest data- HRCT 11/04/20 unable for my visualization.   CLINICAL DATA:  Worsening shortness of breath   EXAM:  CT CHEST WITHOUT CONTRAST   TECHNIQUE:  Multidetector CT imaging of the chest was performed following the  standard protocol without intravenous contrast. High resolution  imaging of the lungs, as well as inspiratory and expiratory imaging,  was performed.   COMPARISON:  Chest CT dated August 29, 2018   FINDINGS:  Cardiovascular: Normal heart size. No pericardial effusion. Coronary  artery calcifications of the circumflex. Aortic valve  calcifications. Minimal calcified plaque of the thoracic aorta.   Mediastinum/Nodes: Mildly enlarged mediastinal lymph nodes,  unchanged compared to prior exam and likely reactive. Esophagus and  thyroid are unremarkable.   Lungs/Pleura: Central airways are patent. Bilateral air trapping.  Peribronchovascular and subpleural reticular opacities with traction  bronchiectasis and no clear craniocaudal predominance. Honeycomb  change primarily seen in the anterior upper lobes is increased when  compared to prior exam. New linear nodular opacity of the right  lower lobe measuring up to 9 mm on series 4, image 210.   Upper Abdomen: No acute abnormality.   Musculoskeletal: No chest wall mass or suspicious bone lesions  identified.   IMPRESSION:  Fibrotic interstitial lung disease with evidence of progression when  compared with August 29, 2018 prior exam. Favor fibrotic  hypersensitivity pneumonitis given presence of air trapping.  Findings are suggestive of an alternative diagnosis (not UIP) per  consensus guidelines: Diagnosis of Idiopathic Pulmonary Fibrosis: An  Official ATS/ERS/JRS/ALAT Clinical Practice Guideline. [place], Iss 5,  [phone], Sep 15 2016.   New linear nodular opacity of the right lower lobe measuring up to 9  mm, likely an area of focal fibrosis. Recommend follow-up chest CT  in 3 months to ensure stability.   Aortic Atherosclerosis (ICD10-I70.0).   Addendum 01/26/2021 -radiology Dr. Weber Cooks wrote back saying CT scan is classic for chronic HP.  We will call the patient and get the patient started on nintedanib counseling which is first-line for progressive non-- IPF ILD.  If he is reluctant then we will do pirfenidone.  We will set up pharmacy counseling.  If he is reluctant for nintedanib based on risk versus benefit we will do pirfenidone  Electronically Signed    By: Yetta Glassman M.D.    On: 11/04/2020 14:17  No results found.   Phone visit 01/28/21  Emilyh  LEt Dustin Dean know ahead of the visit followup with me on 02/23/21   A) making referral to [facility] to discuss ofev which would be first line baed on what radiologist told me about CT and fact rheumaotid facot is positive  B) rheumatoid factor strongly positive - refer rheumatology Dr Deveshwar/Rice  C) I reviewed echo report (below) Will discuss at followup  Aortic Valve: There is moderate stenosis, with peak and mean gradients  of 44.000 and 27.000 mmHg.  Aortic valve area calculates to approximately  1.4 cm    Mitral Valve: There is mild regurgitation.    Tricuspid Valve: There is mild regurgitation.    Tricuspid Valve: The right ventricular systolic pressure is normal (<36  mmHg).   1.  Adequate 2D M-mode and color flow Doppler echocardiogram demonstrates  normal left ventricular chamber size and contractility.  There may be mild  left ventricular hypertrophy.  Ejection fraction is estimated 65%  2.  The aortic valve is thickened and demonstrates reduced leaflet  mobility.  There is mild to moderate aortic stenosis with aortic valve  area calculating to 1.4 cm  3.  Mild mitral annular calcification is noted but there is  good leaflet  mobility.  Mild mitral insufficiency is noted  4.  Tricuspid valve  appears to be normal  5.  The atria are grossly normal size bilaterally  6.  The right ventricular chamber size and function is normal  6.  The pericardium is of normal thickness there is no effusion  OV 02/23/2021  Subjective:  Patient ID: Dustin Dean, male , DOB: [birth year] , age 17 y.o. , MRN: [id number] , ADDRESS: [place] [phone] PCP Joneen Boers, MD Patient Care Team: Joneen Boers, MD as PCP - General (Family Medicine)  This Provider for this visit: Treatment Team:  Attending Provider: Brand Males, MD    02/23/2021 -   Chief Complaint  Patient presents with   Follow-up    Pt recently had a HRCT and PFT and is here today to discuss the results.   Chronic cough on gabapentin  Interstitial lung disease work-up in progress -concern for hypersensitive pneumonitis  -Progressive phenotype between August 2020 and October 2022  9 mm right lower lobe nodule seen in October 2022 for the first time: Stable January 2023  HPI Dontrel Smethers 71 y.o. -since his last visit Dr. Weber Cooks the radiologist did look at the CT scan and this confirmed his CT is not consistent with UIP but more likely an alternate pattern consistent with hypersensitive pneumonitis as seen by air trapping.  Patient has had serology work-up in which it is all negative except rheumatoid factor strongly positive.  He is yet to see the rheumatologist.  He does have significant restrictions on the PFTs with reduced DLCO consistent with his obesity and ILD.  His symptoms are overall stable.  Did go through his exposure history again.  He says in 200 07/2006 and again in 2012 he had had 3 episodes of pneumonia.  All of this happened when he entered school buildings.  He was living in the same house at that time but the house itself was brand-new and there is no mold or mildew.  He is wondering about crawlspace because  only [place] he has seen crawlspace but he said he has never been in the crawl space.  He does not know if there was mold in the crawl space.  I told him it is doubtful that mold or mildew from the crawlspace can get into the house.  He did have a feather pillow but since his last visit he is thrown that out.  He does do gardening and does get exposed to dampness.  At this point in time did discuss with him that he does have ILD and it is progressive.  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  Unclear if the positive rheumatoid factor is playing a role it could be if he has positive for rheumatoid arthritis.  Did indicate that ultimately only a biopsy can put to rest if he has IPF [being Caucasian gentleman greater than 65 is classic phenotype for IPF] but did indicate to him that given progression does not matter whether it is IPF or non-IPF antifibrotic's is indicated.  He is already met with the pharmacist and discussed the 2 antifibrotic's.  Did indicate to him that nintedanib is first-line and non-- IPF progressive phenotype.  Did discuss about the diarrhea.  Denies any contraindication.  Therefore we will start this.  Did discuss whether diarrhea is willing to go through this.  Also discussed about ILD-Pro registry.  He is interested given the consent form.  Did indicate to him that it probably would be beneficial to do some kind of a limited work-up in  differentiating his etiology.  The reasons would be to participate in clinical trials but also in case he needs immunosuppression such as prednisone or CellCept [indicated for hypersensitive pneumonitis but not for IPF] having a specific diagnosis would be beneficial.  I did think that his obesity puts him at some risk for getting surgical lung biopsy and therefore it might be better to start off with bronchoscopy with lavage and transbronchial biopsies.  We can address this in the future.  This visit was just focused on  therapies.  Regarding his 9 mm lower lobe nodule: He had a CT scan of the chest and shows this is stable from October 2022 through January 2023.  The CT scan again reads as alternative pattern consistent with chronic HP.    CT Chest data January 2023  No results found.  Mediastinum/Nodes: Multiple prominent borderline but nonenlarged mediastinal and bilateral hilar lymph nodes, similar to the prior study esophagus is unremarkable in appearance. No axillary lymphadenopathy.   Lungs/Pleura: High-resolution images again demonstrate widespread but patchy areas of ground-glass attenuation, septal thickening, subpleural reticulation, thickening of the peribronchovascular interstitium, cylindrical and varicose traction bronchiectasis, peripheral bronchiolectasis and extensive honeycombing. There is no discernible craniocaudal gradient. The most extensive areas of honeycombing are anterior lung, predominantly in the upper lobes and right middle lobe. Some areas of the lung bases demonstrate relative sparing. Inspiratory and expiratory imaging demonstrates mild to moderate air trapping indicative of small airways disease. No definite progression compared to the recent prior study. No acute consolidative airspace disease. No pleural effusions. Previously described nodular area of architectural distortion in the right lower lobe (axial image 106 of series 8) is stable measuring 8 mm on today's examination, likely part of the underlying fibrosis. No other definite suspicious appearing pulmonary nodules or masses are noted.   Upper Abdomen: Unremarkable.   Musculoskeletal: There are no aggressive appearing lytic or blastic lesions noted in the visualized portions of the skeleton.   IMPRESSION: 1. 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 2. Previously noted nodular area of architectural distortion in the right lower lobe is unchanged, likely part of the fibrosis rather than a pulmonary nodule. 3. There are calcifications of the aortic valve and mitral annulus. Echocardiographic correlation for evaluation of potential valvular dysfunction may be warranted if clinically indicated. 4. Aortic atherosclerosis, in addition to 2 vessel coronary artery disease. Please note that although the presence of coronary artery calcium documents the presence of coronary artery disease, the severity of this disease and any potential stenosis cannot be assessed on this non-gated CT examination. Assessment for potential risk factor modification, dietary therapy or pharmacologic therapy may be warranted, if clinically indicated.   Aortic Atherosclerosis (ICD10-I70.0).     Electronically Signed   By: Vinnie Langton M.D.   On: 02/14/2021 06:17 stable January 2023  PFT       Latest Reference Range & Units 01/27/21 15:53 01/27/21 15:54  Anti Nuclear Antibody (ANA) NEGATIVE   NEGATIVE  Angiotensin-Converting Enzyme 9 - 67 U/L  49  Anti JO-1 0.0 - 0.9 AI  <4.4  Cyclic Citrullin Peptide Ab UNITS  <16  ds DNA Ab IU/mL  1  ENA RNP Ab 0.0 - 0.9 AI  0.4  RA Latex Turbid. <14 IU/mL  323 (H)  SSA (Ro) (ENA)  Antibody, IgG <1.0 NEG AI  <1.0 NEG  SSB (La) (ENA) Antibody, IgG <1.0 NEG AI  <1.0 NEG  Scleroderma (Scl-70) (ENA) Antibody, IgG <1.0 NEG AI  <1.0 NEG  QUANTIFERON-TB GOLD PLUS   Rpt  A.Fumigatus #1 Abs Negative  Negative   Micropolyspora faeni, IgG Negative  Negative   Thermoactinomyces vulgaris, IgG Negative  Negative   A. Pullulans Abs Negative  Negative   Thermoact. Saccharii Negative  Negative   Pigeon Serum Abs Negative  Negative   (H): Data is abnormally high     OV 03/23/2021- acute  video visit due to concerns for acute symptoms ? Ofev side effect  Subjective:  Patient ID: Akil Hoos, male , DOB: [birth year] , age 71 y.o. , MRN: [id number] , ADDRESS: [place] [phone] PCP Joneen Boers, MD Patient Care Team: Joneen Boers, MD as PCP - General (Family Medicine)  This Provider for this visit: Treatment Team:  Attending Provider: Brand Males, MD  Type of visit: Video Circumstance: COVID-19 national emergency Identification of patient Jayro Mcmath with November 08, 1950 and MRN [id number] - 2 person identifier Risks: Risks, benefits, limitations of telephone visit explained. Patient understood and verbalized agreement to proceed Anyone else on call: just patient Patient location: Inpatient bed at [facility] which is another health system This provider location: [place]. pulmonary office [address]  [place], [place] [address].   03/23/2021 -in this video visit was set up on acute basis because he started having symptoms after starting nintedanib.  He wanted to know if it was nintedanib symptoms.  The symptoms sound rather complex and little bit out of proportion for nintedanib.  Therefore I scheduled this video visit and to my surprise he is sitting in a hospital bed at [facility] hospital.    HPI Kemani Demarais 71 y.o. - started ofev mid-feb 2023. Started noticing feeling disoriented and at times weak. Then on 3/4-03/19/21 was feeling very weak whole bidy weakness and needed walker to even go across room. Tired. Stopped going to gym. Was using o2 all the time. Then on 03/21/21 feelign cold. ANd ahd fever  102F.  Does not remember Tuesday much. Initialyt Dx is pneumonia and testing/imaging -> by tthen pccm saw him Dr Dan Europe. Procal negative. WBC normal. Concerns is ILD flare up and recommendation is prednisone.  CT scan did not show pneumonia Curently on IV solumedrol. Currently some better. Yesterday ate well. Currently on  2.5LN . He has been covid negative. Multiplex resp virus panel negative per outside chart revie  With ofev was having some nausea. No abd pain. No diarrhea. No stomach cramps. No vomitting   Currently ofev on hold    03/28/2021 Follow up : ILD , Post hospital follow up  Patient returns for a 1 week follow-up visit.  Patient was seen in January 2023 for second opinion for interstitial lung disease.  Patient was felt to have possible concern for hypersensitivity pneumonitis.  He has had a progressive phenotype noted on CT scan from August 2020 to October 2022.  Serology was negative except for a strongly positive rheumatoid factor.  He was referred to rheumatology.  PFT showed significant restriction and reduced DLCO.  Has a history of recurrent pneumonia.  He was recommended to begin antifibrotic's. With OFEV.  Patient was hospitalized last week with severe dyspnea, weakness, activity intolerance to point he could not walk. Confusion, low oxygen levels, and fever-tmax 102. Kristeen Miss he has a viral illness and  ILD flare.  Patient was hospitalized at an outlying hospital.  He was treated with steroids.  Discharged on a steroid taper. CT chest 03/22/21 report showed fibrotic ILD, stable since 10/2020, no acute consolidation.  Covid , Influezna  and Viral panel were neg.  Since getting out of the hospital patient is feeling much better. Energy level has picked, decreased oxygen demands, no fever. Appetite is some better . Patient is concerned this could have been from [facility] as his symptoms started when he started OFEV.  Is on Oxygen with activity and At bedtime  2l/m . At rest O2 sats are >90%.      MDD 03/28/21 MDD Impression/Recs: Recent flare up. Bx can be prohibitive. get rheum eval and if neg - manage as HP. Maybe at the most BAL if stable  OV 04/05/2021  Subjective:  Patient ID: Dustin Dean, male , DOB: 10-30-1950 , age 58 y.o. , MRN: [id number] , ADDRESS: [place]  [id number] PCP Joneen Boers, MD Patient Care Team: Joneen Boers, MD as PCP - General (Family Medicine)  This Provider for this visit: Treatment Team:  Attending Provider: Brand Males, MD    04/05/2021 -   Chief Complaint  Patient presents with   Follow-up    Pt states he is feeling better after last hospital stay and states each day is getting better.   Chronic cough on gabapentin  Interstitial lung disease work-up in progress -concern for hypersensitive pneumonitis  -Progressive phenotype between August 2020 and October 2022   - started ofev mid feb 2023 -> early march 2023 admitted for ILD flare  9 mm right lower lobe nodule seen in October 2022 for the first time: Stable January 2023  HPI Dustin Dean 71 y.o. -returns for followup. He is on 1.5 tab of pred curently . Next week is 1 tab pred and off. He is better. Feeling better. AT rest not using o2 because he is better. In fact symp score shows huge imrpovement. Walking desat test is also largey imprpvd. All ? Steroid effects. We had conversation about his recent flare up  - has hx of recurrent pna: 2005-2006 x 2 episodes. . Lived in a different house. Then again pneumonia 5 years ago in same house. Rx in urgent care. Had high fever. Current started abruptly 2 weeks into ofev andhe wondering if due to ofev. Did not have classic Ofev sid effects. Mentioned to him Ofev reduces riks of flare.  - gardening: no longer gardening  - featherpillow - got rid of it even after last visit   -visible mold - denies but he discussed his crawl space. Says "there is no mold bu fungs on the top wall". 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 Biopsy indicated if stable but first rheum consult    04/26/2021- Interim hx  Patient presents today for ILD follow-up. He has been on OFEV for three weeks, currently taking 133m twice daily. He has been off oral prednisone for two weeks. Breathing is  not significantly worse off steroids. Cough is the same. Since Saturday he developed chills, fatigue and low grade fever at night. Temp 100.1-100.3. He reprots some heavy breathing. Oxygen 92% or higher. Urinary frequency. Having some incontinence. Continues on Gabapentin for chronic cough. Referred to rheumatology, has an apt beginning of June. He has been avoid exposures mold/mildew, gardening.     05/02/2021 Follow up : ILD , Fever  Patient presents for an acute office visit.  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  Serial CT chest from August 2020 to August 2022 showed progressive changes.  Serology was negative except for a positive rheumatoid factor.  He has been referred to rheumatology with consult pending.  Pulmonary function testing showed significant restriction and decreased DLCO.  Patient started Ofev on March 01, 2021.  Patient was admitted early March with severe weakness dyspnea and activity intolerance.  He also had intermittent confusion hypoxemia and a fever Tmax of 102.  He was felt to have an ILD flare with possible viral illness however COVID, influenza and viral panel were all negative.  He was treated with steroids.  CT chest showed fibrotic ILD stable since October 2022.  His Ofev was held during admission.  After discharge he said that his energy level was improved and had decreased oxygen demands.  Based on oxygen after discharge was oxygen 2 L with activity and at bedtime.  Not requiring oxygen at rest with O2 saturations greater than 90%.  Patient was restarted on Ofev April 05, 2020.  After 2 weeks of been on Ofev patient developed low-grade fever.  This is waxed and waned and yesterday developed a fever Tmax of 103.  Patient went to the emergency room work-up was unrevealing.  CT chest showed stable ILD with no acute process.  CBC was normal blood cultures are no growth to date.   Respiratory viral panel was negative.  Lactic acid was 1.5 urinalysis was normal, urine culture is pending.  LFTs were slightly elevated.  Since last night patient says he is feeling better. Fever is resolved. Took Tylenol . O2 saturations are 100%.  Has some sore throat that started late night .  Had good appetite today . No fever today . No increased Oxygen demands.  Wife is [name].  Patient denies any rash, skin lesions, skin redness, joint swelling, insect or tick bite, travel, discolored mucus, abdominal pain, nausea vomiting or diarrhea. Has no previous joint replacement or surgeries. Abdominal hernia repair >93yr ago. No abd pain,  [facility]  2D echo January 25, 2018 3 aortic valve is thickened with mild to moderate aortic valve stenosis.,  EF 55 to 60%.  Right ventricle is normal systolic function is normal.     OV 05/19/2021  Subjective:  Patient ID: KBethena Dean male , DOB: 5Aug 06, 1952, age 89y.o. , MRN: [id number], ADDRESS: [id number] BJoneen Boers MD Patient Care Team: BJoneen Boers MD as PCP - General (Family Medicine)  This Provider for this visit: Treatment Team:  Attending Provider: RBrand Males MD    05/19/2021 -   Chief Complaint  Patient presents with   Follow-up    PFT performed today.  Pt states since stopping the OFEV, he has not had anymore fever and states overall he has felt good.    Chronic cough on gabapentin  - well controlled  Interstitial lung disease work-up in progress -concern for hypersensitive pneumonitis  -Progressive phenotype between August 2020 and October 2022   - started ofev mid feb 2023 -> early march 2023 admitted for ILD flare ->   9 mm right lower lobe nodule seen in October 2022 for the first time: Stable January 2023  HPI Dustin Sanagust[place]89y.o. -returns with wife [name].  He tells me that after last visit he did a rechallenge with  nintedanib and he picked up fever and  ended up in the emergency room very similar to March 2023 hospitalization.  Symptoms started 1-2 weeks into taking nintedanib.  His wife feels strongly this is nintedanib related.  Have not seen the patient gets significant side effects from nintedanib but have seen one of the patient gets something similar with pirfenidone.  That particular patient had even after 1 dose.  I think the strength of evidence here is that this systemic inflammatory response is probably related to nintedanib.  Therefore he has stopped it.  I have advised him against taking nintedanib ever again.  Clinically he is feeling stable.  His walking desaturation test is stable.  His pulmonary function test is also stable.  He uses 2 L with exertion but today in office when we walked him on room air he dropped 7 points and this is similar to before.  He wants to try pirfenidone.  We have discussed this in the past.  We also discussed prednisone but he does not want to do prednisone because of hyperglycemia.  We discussed the risk, benefits and limitations of pirfenidone.  Pirfenidone is not well studied in chronic HP.  Is only studied in 1 subtype of non-IPF progressive phenotype.  Nevertheless is the only other antifibrotic option available.  He wants to try a donor sample.  We will give him 2 months worth.  He is traveling to the beach  once he comes back he will take it    07/03/2021 Follow up : ILD  Patient returns for a 6-week follow-up.  Patient has underlying interstitial lung disease with progressive phenotype.  Serial CT chest from August 20 to August 2022 showed progressive changes.  Serology was negative except for positive rheumatoid factor.  Rheumatology consult is pending.  Previous PFTs have showed significant restriction and decreased diffusing capacity.  Patient was started on Ofev March 01, 2021.  Patient had a SIRS like response to OFEV x 2 .  Ofev has been stopped indefinitely and advised not to restart.  Patient was  placed on a drug holiday.  Started on Esbriet 1 month ago.  Patient says since starting Esbriet he seems to be tolerating very well.  LFTs prior to start had returned back to normal.  Previously were elevated.  Patient says he has had minimal nausea.  No diarrhea no upset stomach and appetite seems to be normal.  Patient says he has been using sunscreen when he is out side.  Has had no rash.  We discussed returning for ongoing labs. Patient has been referred to pulmonary rehab but has not contacted today. Patient says overall he is doing okay.  Wears his oxygen with activity as needed.  And at bedtime.  Has had no increased oxygen demands. Patient says he still gets short of breath with heavy activities.  But feels like he is stable at this point.  Has had no return of fever.      OV 09/08/2021  Subjective:  Patient ID: Dustin Dean, male , DOB: Nov 10, 1950 , age 77 y.o. , MRN: [id number] , ADDRESS: [place] [id number] PCP Joneen Boers, MD Patient Care Team: Joneen Boers, MD as PCP - General (Family Medicine)  This Provider for this visit: Treatment Team:  Attending Provider: Brand Males, MD    09/08/2021 -   Chief Complaint  Patient presents with   Follow-up    Pt states he has been doing okay since last visit and denies any  complaints.    Chronic cough on gabapentin  - well controlled  Interstitial lung disease work-up in progress -concern for hypersensitive pneumonitis  -Progressive phenotype between August 2020 and October 2022   - started ofev mid feb 2023 -> early march 2023 admitted for ILD flare -> rechallenged ofev April 2023 -> ER vist SIRS   - SAE with OFev/ Ofev stopped  - started Pirfenioned May 2023 (rjected prednisone due to side effec tprofile)  9 mm right lower lobe nodule seen in October 2022 for the first time: Stable January 202  HPI Lavone Weisel 71 y.o. -returns for follow-up.  He tells me that he is lost more weight.  He is  stable.  His symptom score shows good stability.  He is tolerating pirfenidone really well.  He has lost weight.  Current BMI is 31 but it is all intentional with the help of exercise, pirfenidone and also eating less calories.  No side effects from pirfenidone no sunburn nothing.  Is taking 3 to 2 pills 3 times daily.  He is willing to roll over to 1 big pill 3 times daily.  His main questions were -He wants to do some gardening such as cut wine but he assures me that will not be any exposure to organic dust.  -He wants to donate his body after death to medical school.  I advised him that either [facility] or [facility] is fine.  -He also has aortic stenosis.  He saw cardiology today at [facility].  He saw Dr. Loni Beckwith.  He is being referred to surgery at [place] for TAVR consideration.  According to the discussion [not clearly apparent in the chart review] TAVR is being recommended early because of his pulmonary fibrosis.  I did explain to him that in advance pulmonary fibrosis surgical risk is high but did want him to ask the surgeon if there is any downside to operating or placing TAVR procedure when the aortic stenosis is not critical.      SYMPTOM SCALE - ILD 01/24/2021 02/23/2021   04/05/2021 Post hopsilaization. On pred taper and off ofev Ofev 150 twice daily; No prednsone  05/19/2021 214#   Current weight           O2 use 2L [place] with ex 2L with ex 2L Wth ex at hoe 2L    Shortness of Breath 0 -> 5 scale with 5 being worst (score 6 If unable to do)         At rest 0 0 0 1 0   Simple tasks - showers, clothes change, eating, shaving 1 1 0.5 2 1    Household (dishes, doing bed, laundry) 1 1 1 2 2    Shopping 2 1 0 NA 1   Walking level at own pace 5 2 0 1 (with oxygen) 1   Walking up Stairs 5 3 1  with o2, 6 without o2 2 (with oxygen) 2   Total (30-36) Dyspnea Score 14 8   8 7    How bad is your cough? 2 3 0 with gbapentin 1-2 (on gabapentin) 2   How bad is your  fatigue 1 2 0 4 1   How bad is nausea 00000 0 0 0 0   How bad is vomiting?   0 0 0 0 0   How bad is diarrhea? 0 0 0 0 0   How bad is anxiety? 0 0 0 0 0   How bad is depression 0 0  0 0 (on antidepressant) 1   Any chronic pain - if so where and how bad 0 0 0 0 0      Simple office walk 185 feet x  3 laps goal with forehead probe 01/24/2021  04/05/2021 On prendisone. Off ofev 05/19/2021 Off ofev 09/08/2021 On esbiret since may 2023  O2 used Ra at rest, desat at 1 lap and then walked with 2L piulse Ra - being on room air for 15 min and in mddile of pred burst taper    Number laps completed 1 lap on RA, then 3 laps on 2L Did all 3 laps    Comments about pace x avg  avg  Resting Pulse Ox/HR 96% and 88/min at 1 lap 98% an Hr 92 98% AND HR 81 98% and HR 77  Final Pulse Ox/HR 92% and 115/min on 3 laps on 2L pulsed 92% and HR 117 91% and HR 115 92% and HR 113  Desaturated </= 88% no no no no  Desaturated <= 3% points yes Yes, 6  Yes 7  Yes 6 pots  Got Tachycardic >/= 90/min yes yes yes avg  Symptoms at end of test Modreate dyspnea Modate dyspnea Mild dyspnea Mild dyspnea  Miscellaneous comments Corrected with 2L [place] Did not need o2, improved       PFT     Latest Ref Rng & Units 05/19/2021   10:56 AM 02/21/2021    4:04 PM  PFT Results  FVC-Pre L 1.95  1.82   FVC-Predicted Pre % 46  43   FVC-Post L  1.95   FVC-Predicted Post %  46   Pre FEV1/FVC % % 90  88   Post FEV1/FCV % %  91   FEV1-Pre L 1.75  1.60   FEV1-Predicted Pre % 56  51   FEV1-Post L  1.76   DLCO uncorrected ml/min/mmHg 19.56  15.57   DLCO UNC% % 79  63   DLCO corrected ml/min/mmHg 19.56  15.57   DLCO COR %Predicted % 79  63   DLVA Predicted % 116  106   TLC L  3.76   TLC % Predicted %  56   RV % Predicted %  75        has a past medical history of Aortic stenosis, Flu, Pneumonia, Prostate cancer ([place]), Pulmonary fibrosis ([place]), and Skin cancer.   reports that he has never smoked. He has been exposed to tobacco  smoke. He has never used smokeless tobacco.  Past Surgical History:  Procedure Laterality Date   HERNIA REPAIR     NASAL SEPTUM SURGERY     SKIN CANCER EXCISION      Allergies  Allergen Reactions   Bee Venom Rash   Shellfish Allergy Hives   Nintedanib Other (See Comments)    SIRS, fevers   Pseudoephedrine Anxiety    Immunization History  Administered Date(s) Administered   Fluad Quad(high Dose 65+) 10/23/2017, 09/30/2019, 10/14/2020, 10/18/2020   Influenza-Unspecified 11/11/2009, 11/08/2011, 11/03/2013, 11/22/2014, 11/10/2015   Moderna Covid-19 Vaccine Bivalent Booster 79yr & up 10/24/2020   Moderna Sars-Covid-2 Vaccination 03/19/2019, 04/16/2019, 11/16/2019, 05/07/2020   Pneumococcal Conjugate-13 12/08/2007, 12/21/2015, 09/04/2018   Pneumococcal Polysaccharide-23 12/08/2007, 06/21/2017, 06/16/2018   Tdap 12/08/2007, 11/27/2018   Zoster Recombinat (Shingrix) 10/30/2013, 09/04/2018, 11/27/2018   Zoster, Live 10/30/2013   Zoster, Unspecified 10/30/2013    Family History  Problem Relation Age of Onset   Cancer Mother    Congestive Heart Failure Father    Prostate cancer Father  Healthy Sister      Current Outpatient Medications:    acetaminophen (TYLENOL) 325 MG tablet, Take by mouth as needed., Disp: , Rfl:    aspirin-acetaminophen-caffeine (EXCEDRIN MIGRAINE) 250-250-65 MG tablet, Take by mouth as needed., Disp: , Rfl:    Calcium Carb-Cholecalciferol (CALCIUM/VITAMIN D PO), Take 1 tablet by mouth daily., Disp: , Rfl:    Cetirizine HCl (ZYRTEC PO), Take by mouth., Disp: , Rfl:    fluorouracil (EFUDEX) 5 % cream, Apply topically., Disp: , Rfl:    fluticasone (FLONASE) 50 MCG/ACT nasal spray, Place into both nostrils daily., Disp: , Rfl:    gabapentin (NEURONTIN) 300 MG capsule, Take 300 mg by mouth 3 (three) times daily., Disp: , Rfl:    loperamide (IMODIUM) 2 MG capsule, Take by mouth., Disp: , Rfl:    meclizine (ANTIVERT) 25 MG tablet, Take 25 mg by mouth 2 (two)  times daily as needed., Disp: , Rfl:    Multiple Vitamin (MULTIVITAMIN) capsule, Take 1 capsule by mouth every morning., Disp: , Rfl:    naproxen sodium (ALEVE) 220 MG tablet, Take 220 mg by mouth daily as needed., Disp: , Rfl:    omeprazole (PRILOSEC) 40 MG capsule, Take 40 mg by mouth daily., Disp: , Rfl:    PAROXETINE HCL PO, Take by mouth., Disp: , Rfl:    Pirfenidone (ESBRIET) 267 MG TABS, Take 3 tablets (801 mg total) by mouth 3 (three) times daily with meals., Disp: 270 tablet, Rfl: 4   rosuvastatin (CRESTOR) 5 MG tablet, Take 1 tablet by mouth daily., Disp: , Rfl:    SUMAtriptan (IMITREX) 100 MG tablet, TAKE ONE TABLET BY MOUTH AT ONSET OF HEADACHE; MAY REPEAT ONE TABLET IN 2 HOURS IF NEEDED. MAX OF 200 MG PER DAY, Disp: , Rfl:    albuterol (VENTOLIN HFA) 108 (90 Base) MCG/ACT inhaler, Inhale into the lungs. (Patient not taking: Reported on 09/08/2021), Disp: , Rfl:       Objective:   Vitals:   09/08/21 1358  BP: 126/80  Pulse: 84  SpO2: 96%  Weight: 209 lb 12.8 oz (95.2 kg)  Height: 5' 8"  (1.727 m)    Estimated body mass index is 31.9 kg/m as calculated from the following:   Height as of this encounter: 5' 8"  (1.727 m).   Weight as of this encounter: 209 lb 12.8 oz (95.2 kg).  @WEIGHTCHANGE @  Filed Weights   09/08/21 1358  Weight: 209 lb 12.8 oz (95.2 kg)     Physical Exam    General: No distress. Looks leaner Neuro: Alert and Oriented x 3. GCS 15. Speech normal Psych: Pleasant Resp:  Barrel Chest - no.  Wheeze - no, Crackles - YES bilaterally apically and also caudally., No overt respiratory distress CVS: Normal heart sounds. Murmurs -ejection systolic murmur present Ext: Stigmata of Connective Tissue Disease - no HEENT: Normal upper airway. PEERL +. No post nasal drip        Assessment:       ICD-10-CM   1. ILD (interstitial lung disease) ([place])  J84.9     2. Medication monitoring encounter  Z51.81     3. Encounter for medication counseling  Z71.89      4. Chronic cough  R05.3     5. Preop respiratory exam  Z01.811     6. Obesity (BMI 30.0-34.9)  E66.9          Plan:     Patient Instructions   ILD (interstitial lung disease) ([place]) -high suspect for chronic hypersensitive  pneumonitis Pulmonary air trapping Chronic respiratory failure with hypoxia (HCC) Rheumatoid factor positive Encounter for therapeutic drug monitoring  - - PFT stable feb 2023 -> May 2023 -> symptoms stable through August 2023 -Glad you are tolerating pirfenidone really well   Plan (shared decision making) -- Check LFT 09/08/2021 -Continue pirfenidone per protocol  -Pharmacy to consider rolling into 1 big pill 3 times daily  - take with food  - space 5-6 hours apart  - apply sunscreen  - hydrate well -Do spirometry and DLCO and 8-12  weeks -Holding off on rheumatology evaluation for now -Okay to do judicious gardening as long as there is no exposure to organic dust andwear N95 mask  Chronic cough  Noticed that gabapentin is really helping you  Plan - Okay to continue gabapentin   Aortic Stenosis - moderate Preoperative respiratory evaluation  - in general when pulmonary fibrosis is severe and patient is on resting oxygen complications after surgery a higher.  Therefore it is better to get any surgery especially thoracic or abdominal earlier than later.  Having said that you want to ask your cardiac surgeon if there is any downside risk to operating on aortic stenosis earlier than when the valve is in severe stenosis  Obesity  Congratulations on weight loss. Current BMI 31  Plan - Monitor  Follow-up - Do spirometry and DLCO in 8-12 weeks - FAce to face  see Dr. Chase Caller in 8-12 weeks but after pulmonary function test  -30-minute visit, simple walking desaturation test and symptom score at follow-up   High complex condition requiring intensive therapeutic monitoring   SIGNATURE    Dr. Brand Males, M.D., F.C.C.P,  Pulmonary  and Critical Care Medicine Staff Physician, [place] Director - Interstitial Lung Disease  Program  Pulmonary [place] at [place], [place], [address]  Pager: [phone], If no answer or between  15:00h - 7:00h: call [phone] Telephone: [phone]  2:30 PM 09/08/2021

## 2021-09-08 NOTE — Patient Instructions (Addendum)
  ILD (interstitial lung disease) ([place]) -high suspect for chronic hypersensitive pneumonitis Pulmonary air trapping Chronic respiratory failure with hypoxia (HCC) Rheumatoid factor positive Encounter for therapeutic drug monitoring  - - PFT stable feb 2023 -> May 2023 -> symptoms stable through August 2023 -Glad you are tolerating pirfenidone really well   Plan (shared decision making) -- Check LFT 09/08/2021 -Continue pirfenidone per protocol  -Pharmacy to consider rolling into 1 big pill 3 times daily  - take with food  - space 5-6 hours apart  - apply sunscreen  - hydrate well -Do spirometry and DLCO and 8-12  weeks -Holding off on rheumatology evaluation for now -Okay to do judicious gardening as long as there is no exposure to organic dust andwear N95 mask  Chronic cough  Noticed that gabapentin is really helping you  Plan - Okay to continue gabapentin   Aortic Stenosis - moderate Preoperative respiratory evaluation  - in general when pulmonary fibrosis is severe and patient is on resting oxygen complications after surgery a higher.  Therefore it is better to get any surgery especially thoracic or abdominal earlier than later.  Having said that you want to ask your cardiac surgeon if there is any downside risk to operating on aortic stenosis earlier than when the valve is in severe stenosis  Obesity  Congratulations on weight loss. Current BMI 31  Plan - Monitor  Follow-up - Do spirometry and DLCO in 8-12 weeks - FAce to face  see Dr. Chase Caller in 8-12 weeks but after pulmonary function test  -30-minute visit, simple walking desaturation test and symptom score at follow-up

## 2021-09-21 ENCOUNTER — Encounter: Payer: Self-pay | Admitting: Internal Medicine

## 2021-09-22 NOTE — Telephone Encounter (Signed)
MR, please see pt's email regarding the PF support group and potential dental/gum tenderness while on Esbriet.

## 2021-09-26 NOTE — Telephone Encounter (Signed)
We are trying to get a WebEx set up.  Please let him know that by tomorrow afternoon he should get an invitation.  However unfortunately it will not be Zoom or [employer] meet/it has to be [employer].  This has been delay in trying to sort this out.  It will come from my [place] email.  Email

## 2021-09-27 NOTE — Telephone Encounter (Signed)
Let him know webex sent

## 2021-10-02 ENCOUNTER — Encounter (HOSPITAL_COMMUNITY): Payer: Self-pay

## 2021-10-02 ENCOUNTER — Encounter (HOSPITAL_COMMUNITY)
Admission: RE | Admit: 2021-10-02 | Discharge: 2021-10-02 | Disposition: A | Discharge status: Home / Self Care | Payer: Medicare Other | Source: Ambulatory Visit | Attending: Internal Medicine | Admitting: Internal Medicine

## 2021-10-02 VITALS — BP 112/78 | HR 82 | Ht 68.0 in | Wt 210.3 lb

## 2021-10-02 DIAGNOSIS — J849 Interstitial pulmonary disease, unspecified: Secondary | ICD-10-CM | POA: Diagnosis present

## 2021-10-02 NOTE — Progress Notes (Signed)
Pulmonary Rehab Orientation Physical Assessment Note  Physical assessment reveals  Pt is alert and oriented x 3.  Heart rate is normal with loud murmer noted.  Pt aware of aortic stenosis and has an upcoming evaluation for TAVR procedure before the pulmonary fibrosis becomes advanced and he becomes more high risk. Breath sounds characteristic fibrotic crackles heard throughout. Exp wheeze auscultated right upper lobe . Reports non-productive cough. Bowel sounds present.  Pt denies abdominal discomfort, nausea, vomiting or diarrhea. Grip strength equal, strong. Distal pulses palpable; no swelling to lower extremities. Cherre Huger, BSN Cardiac and Training and development officer

## 2021-10-02 NOTE — Progress Notes (Signed)
Dustin Dean 71 y.o. male Pulmonary Rehab Orientation Note This patient who was referred to Pulmonary Rehab by Dr. Chase Caller with the diagnosis of ILD arrived today in Cardiac and Pulmonary Rehab. He arrived ambulatory with normal gait. He does carry portable oxygen. [employer] is the provider for their DME. Per patient, Naheim uses oxygen at night when going to sleep, with exercise. Color good, skin warm and dry. Patient is oriented to time and place. Patient's medical history, psychosocial health, and medications reviewed. Psychosocial assessment reveals patient lives with spouse. Boleslaw is currently retired. Patient hobbies include  reading . Patient reports his stress level is low. Areas of stress/anxiety include family . Patient does not exhibit signs of depression. PHQ2/9 score 0/0. Mikel shows good  coping skills with positive outlook on life. Offered emotional support and reassurance. Physical assessment performed by Maurice Small RN. Please see their orientation physical assessment note. Christophe reports he  does take medications as prescribed. Patient states he  follows a regular  diet. The patient has been trying to lose weight through a healthy diet and exercise program.. Patient's weight will be monitored closely. Demonstration and practice of PLB using pulse oximeter. Macklen able to return demonstration satisfactorily. Safety and hand hygiene in the exercise area reviewed with patient. Elek voices understanding of the information reviewed. Department expectations discussed with patient and achievable goals were set. The patient shows enthusiasm about attending the program and we look forward to working with Chrissie Noa. Kameron completed a 6 min walk test today and is scheduled to begin exercise on 10/10/21 at 1:15.   1020-1200 Darrick Meigs, BS, ACSM-CEP

## 2021-10-02 NOTE — Progress Notes (Signed)
Pulmonary Individual Treatment Plan  Patient Details  Name: Dustin Dean MRN: [id number] Date of Birth: February 26, 1950 Referring Provider:   April Manson Pulmonary Rehab Walk Test from 10/02/2021 in [place]  Referring Provider [place]       Initial Encounter Date:  Flowsheet Row Pulmonary Rehab Walk Test from 10/02/2021 in [place]  Date 10/02/21       Visit Diagnosis: ILD (interstitial lung disease) ([place])  Patient's Home Medications on Admission:   Current Outpatient Medications:    acetaminophen (TYLENOL) 325 MG tablet, Take by mouth as needed., Disp: , Rfl:    aspirin-acetaminophen-caffeine (EXCEDRIN MIGRAINE) 250-250-65 MG tablet, Take by mouth as needed., Disp: , Rfl:    Calcium Carb-Cholecalciferol (CALCIUM/VITAMIN D PO), Take 1 tablet by mouth daily., Disp: , Rfl:    Cetirizine HCl (ZYRTEC PO), Take by mouth., Disp: , Rfl:    fluorouracil (EFUDEX) 5 % cream, Apply topically., Disp: , Rfl:    fluticasone (FLONASE) 50 MCG/ACT nasal spray, Place into both nostrils daily., Disp: , Rfl:    gabapentin (NEURONTIN) 300 MG capsule, Take 300 mg by mouth 3 (three) times daily., Disp: , Rfl:    loperamide (IMODIUM) 2 MG capsule, Take 1 mg by mouth once., Disp: , Rfl:    meclizine (ANTIVERT) 25 MG tablet, Take 25 mg by mouth 2 (two) times daily as needed., Disp: , Rfl:    Multiple Vitamin (MULTIVITAMIN) capsule, Take 1 capsule by mouth every morning., Disp: , Rfl:    naproxen sodium (ALEVE) 220 MG tablet, Take 220 mg by mouth daily as needed., Disp: , Rfl:    omeprazole (PRILOSEC) 40 MG capsule, Take 40 mg by mouth daily., Disp: , Rfl:    PAROXETINE HCL PO, Take by mouth., Disp: , Rfl:    Pirfenidone (ESBRIET) 267 MG TABS, Take 3 tablets (801 mg total) by mouth 3 (three) times daily with meals., Disp: 270 tablet, Rfl: 4   rosuvastatin (CRESTOR) 5 MG tablet, Take 1 tablet by mouth daily., Disp: , Rfl:    SUMAtriptan  (IMITREX) 100 MG tablet, TAKE ONE TABLET BY MOUTH AT ONSET OF HEADACHE; MAY REPEAT ONE TABLET IN 2 HOURS IF NEEDED. MAX OF 200 MG PER DAY, Disp: , Rfl:    albuterol (VENTOLIN HFA) 108 (90 Base) MCG/ACT inhaler, Inhale into the lungs. (Patient not taking: Reported on 09/08/2021), Disp: , Rfl:   Past Medical History: Past Medical History:  Diagnosis Date   Aortic stenosis    Flu    Pneumonia    Prostate cancer ([place])    Pulmonary fibrosis ([place])    Skin cancer     Tobacco Use: Social History   Tobacco Use  Smoking Status Never   Passive exposure: Past  Smokeless Tobacco Never    Labs: Review Flowsheet        No data to display          Capillary Blood Glucose: No results found for: "GLUCAP"   Pulmonary Assessment Scores:  Pulmonary Assessment Scores     Row Name 10/02/21 1119         ADL UCSD   ADL Phase Entry     SOB Score total 40       CAT Score   CAT Score 16       mMRC Score   mMRC Score 2             UCSD: Self-administered rating of dyspnea associated with activities of daily  living (ADLs) 6-point scale (0 = "not at all" to 5 = "maximal or unable to do because of breathlessness")  Scoring Scores range from 0 to 120.  Minimally important difference is 5 units  CAT: CAT can identify the health impairment of COPD patients and is better correlated with disease progression.  CAT has a scoring range of zero to 40. The CAT score is classified into four groups of low (less than 10), medium (10 - 20), high (21-30) and very high (31-40) based on the impact level of disease on health status. A CAT score over 10 suggests significant symptoms.  A worsening CAT score could be explained by an exacerbation, poor medication adherence, poor inhaler technique, or progression of COPD or comorbid conditions.  CAT MCID is 2 points  mMRC: mMRC (Modified Medical Research Council) Dyspnea Scale is used to assess the degree of baseline functional disability in patients  of respiratory disease due to dyspnea. No minimal important difference is established. A decrease in score of 1 point or greater is considered a positive change.   Pulmonary Function Assessment:  Pulmonary Function Assessment - 10/02/21 1122       Breath   Bilateral Breath Sounds Decreased;Rales;Wheezes    Shortness of Breath No;Limiting activity             Exercise Target Goals: Exercise Program Goal: Individual exercise prescription set using results from initial 6 min walk test and THRR while considering  patient's activity barriers and safety.   Exercise Prescription Goal: Initial exercise prescription builds to 30-45 minutes a day of aerobic activity, 2-3 days per week.  Home exercise guidelines will be given to patient during program as part of exercise prescription that the participant will acknowledge.  Activity Barriers & Risk Stratification:  Activity Barriers & Cardiac Risk Stratification - 10/02/21 1116       Activity Barriers & Cardiac Risk Stratification   Activity Barriers Muscular Weakness;Shortness of Breath;Deconditioning    Cardiac Risk Stratification Moderate             6 Minute Walk:  6 Minute Walk     Row Name 10/02/21 1223         6 Minute Walk   Phase Initial     Distance 1175 feet     Walk Time 6 minutes     # of Rest Breaks 0     MPH 2.23     METS 2.68     RPE 12     Perceived Dyspnea  1     VO2 Peak 9.37     Symptoms No     Resting HR 82 bpm     Resting BP 112/78     Resting Oxygen Saturation  97 %     Exercise Oxygen Saturation  during 6 min walk 92 %     Max Ex. HR 108 bpm     Max Ex. BP 138/76     2 Minute Post BP 130/76       Interval HR   1 Minute HR 106     2 Minute HR 108     3 Minute HR 108     4 Minute HR 108     5 Minute HR 108     6 Minute HR 107     2 Minute Post HR 77     Interval Heart Rate? Yes       Interval Oxygen   Interval Oxygen? Yes     Baseline Oxygen  Saturation % 97 %     1 Minute Oxygen  Saturation % 94 %     1 Minute Liters of Oxygen 2 L     2 Minute Oxygen Saturation % 92 %     2 Minute Liters of Oxygen 2 L     3 Minute Oxygen Saturation % 93 %     3 Minute Liters of Oxygen 2 L     4 Minute Oxygen Saturation % 92 %     4 Minute Liters of Oxygen 2 L     5 Minute Oxygen Saturation % 94 %     5 Minute Liters of Oxygen 2 L     6 Minute Oxygen Saturation % 96 %     6 Minute Liters of Oxygen 2 L     2 Minute Post Oxygen Saturation % 96 %     2 Minute Post Liters of Oxygen 2 L              Oxygen Initial Assessment:  Oxygen Initial Assessment - 10/02/21 1111       Home Oxygen   Home Oxygen Device Home Concentrator    Sleep Oxygen Prescription Continuous    Liters per minute 2.5    Home Exercise Oxygen Prescription Pulsed    Liters per minute 2    Home Resting Oxygen Prescription None    Compliance with Home Oxygen Use Yes      Initial 6 min Walk   Oxygen Used Continuous    Liters per minute 2      Program Oxygen Prescription   Program Oxygen Prescription Continuous    Liters per minute 2      Intervention   Short Term Goals To learn and exhibit compliance with exercise, home and travel O2 prescription;To learn and understand importance of monitoring SPO2 with pulse oximeter and demonstrate accurate use of the pulse oximeter.;To learn and understand importance of maintaining oxygen saturations>88%;To learn and demonstrate proper use of respiratory medications;To learn and demonstrate proper pursed lip breathing techniques or other breathing techniques.     Long  Term Goals Exhibits compliance with exercise, home  and travel O2 prescription;Verbalizes importance of monitoring SPO2 with pulse oximeter and return demonstration;Maintenance of O2 saturations>88%;Exhibits proper breathing techniques, such as pursed lip breathing or other method taught during program session;Compliance with respiratory medication;Demonstrates proper use of MDI's              Oxygen Re-Evaluation:   Oxygen Discharge (Final Oxygen Re-Evaluation):   Initial Exercise Prescription:  Initial Exercise Prescription - 10/02/21 1200       Date of Initial Exercise RX and Referring Provider   Date 10/02/21    Referring Provider Ramaswamy    Expected Discharge Date 12/05/21      Oxygen   Oxygen Continuous    Liters 2    Maintain Oxygen Saturation 88% or higher      Recumbant Bike   Level 2    RPM 60    Minutes 15      NuStep   Level 2    SPM 60    Minutes 15      Prescription Details   Frequency (times per week) 2    Duration Progress to 30 minutes of continuous aerobic without signs/symptoms of physical distress      Intensity   THRR 40-80% of Max Heartrate 60-121    Ratings of Perceived Exertion 11-13    Perceived Dyspnea 0-4  Progression   Progression Continue to progress workloads to maintain intensity without signs/symptoms of physical distress.      Resistance Training   Training Prescription Yes    Weight blue bands    Reps 10-15             Perform Capillary Blood Glucose checks as needed.  Exercise Prescription Changes:   Exercise Comments:   Exercise Goals and Review:   Exercise Goals     Row Name 10/02/21 1238             Exercise Goals   Increase Physical Activity Yes       Intervention Provide advice, education, support and counseling about physical activity/exercise needs.;Develop an individualized exercise prescription for aerobic and resistive training based on initial evaluation findings, risk stratification, comorbidities and participant's personal goals.       Expected Outcomes Short Term: Attend rehab on a regular basis to increase amount of physical activity.;Long Term: Add in home exercise to make exercise part of routine and to increase amount of physical activity.;Long Term: Exercising regularly at least 3-5 days a week.       Increase Strength and Stamina Yes       Intervention Provide  advice, education, support and counseling about physical activity/exercise needs.;Develop an individualized exercise prescription for aerobic and resistive training based on initial evaluation findings, risk stratification, comorbidities and participant's personal goals.       Expected Outcomes Short Term: Increase workloads from initial exercise prescription for resistance, speed, and METs.;Short Term: Perform resistance training exercises routinely during rehab and add in resistance training at home;Long Term: Improve cardiorespiratory fitness, muscular endurance and strength as measured by increased METs and functional capacity (6MWT)       Able to understand and use rate of perceived exertion (RPE) scale Yes       Intervention Provide education and explanation on how to use RPE scale       Expected Outcomes Short Term: Able to use RPE daily in rehab to express subjective intensity level;Long Term:  Able to use RPE to guide intensity level when exercising independently       Able to understand and use Dyspnea scale Yes       Intervention Provide education and explanation on how to use Dyspnea scale       Expected Outcomes Short Term: Able to use Dyspnea scale daily in rehab to express subjective sense of shortness of breath during exertion;Long Term: Able to use Dyspnea scale to guide intensity level when exercising independently       Knowledge and understanding of Target Heart Rate Range (THRR) Yes       Intervention Provide education and explanation of THRR including how the numbers were predicted and where they are located for reference       Expected Outcomes Short Term: Able to state/look up THRR;Long Term: Able to use THRR to govern intensity when exercising independently;Short Term: Able to use daily as guideline for intensity in rehab       Understanding of Exercise Prescription Yes       Intervention Provide education, explanation, and written materials on patient's individual exercise  prescription       Expected Outcomes Short Term: Able to explain program exercise prescription;Long Term: Able to explain home exercise prescription to exercise independently                Exercise Goals Re-Evaluation :   Discharge Exercise Prescription (Final Exercise Prescription Changes):  Nutrition:  Target Goals: Understanding of nutrition guidelines, daily intake of sodium '1500mg'$ , cholesterol '200mg'$ , calories 30% from fat and 7% or less from saturated fats, daily to have 5 or more servings of fruits and vegetables.  Biometrics:  Pre Biometrics - 10/02/21 1238       Pre Biometrics   Grip Strength 24 kg              Nutrition Therapy Plan and Nutrition Goals:   Nutrition Assessments:  MEDIFICTS Score Key: ?70 Need to make dietary changes  40-70 Heart Healthy Diet ? 40 Therapeutic Level Cholesterol Diet   Picture Your Plate Scores: <11 Unhealthy dietary pattern with much room for improvement. 41-50 Dietary pattern unlikely to meet recommendations for good health and room for improvement. 51-60 More healthful dietary pattern, with some room for improvement.  >60 Healthy dietary pattern, although there may be some specific behaviors that could be improved.    Nutrition Goals Re-Evaluation:   Nutrition Goals Discharge (Final Nutrition Goals Re-Evaluation):   Psychosocial: Target Goals: Acknowledge presence or absence of significant depression and/or stress, maximize coping skills, provide positive support system. Participant is able to verbalize types and ability to use techniques and skills needed for reducing stress and depression.  Initial Review & Psychosocial Screening:  Initial Psych Review & Screening - 10/02/21 1102       Initial Review   Current issues with None Identified      Family Dynamics   Good Support System? Yes    Comments wife Lynelle Smoke, adoptive children Ruby Cola and his wife      Screening Interventions   Interventions Encouraged  to exercise    Expected Outcomes Long Term goal: The participant improves quality of Life and PHQ9 Scores as seen by post scores and/or verbalization of changes;Short Term goal: Identification and review with participant of any Quality of Life or Depression concerns found by scoring the questionnaire.;Long Term Goal: Stressors or current issues are controlled or eliminated.;Short Term goal: Utilizing psychosocial counselor, staff and physician to assist with identification of specific Stressors or current issues interfering with healing process. Setting desired goal for each stressor or current issue identified.             Quality of Life Scores:  Scores of 19 and below usually indicate a poorer quality of life in these areas.  A difference of  2-3 points is a clinically meaningful difference.  A difference of 2-3 points in the total score of the Quality of Life Index has been associated with significant improvement in overall quality of life, self-image, physical symptoms, and general health in studies assessing change in quality of life.  PHQ-9: Review Flowsheet       10/02/2021  Depression screen PHQ 2/9  Decreased Interest 0 0  Down, Depressed, Hopeless 0 0  PHQ - 2 Score 0 0  Altered sleeping 0  Tired, decreased energy 0  Change in appetite 0  Feeling bad or failure about yourself  0  Trouble concentrating 0  Moving slowly or fidgety/restless 0  Suicidal thoughts 0  PHQ-9 Score 0  Difficult doing work/chores Not difficult at all   Interpretation of Total Score  Total Score Depression Severity:  1-4 = Minimal depression, 5-9 = Mild depression, 10-14 = Moderate depression, 15-19 = Moderately severe depression, 20-27 = Severe depression   Psychosocial Evaluation and Intervention:  Psychosocial Evaluation - 10/02/21 1103       Psychosocial Evaluation & Interventions   Interventions Encouraged to exercise with  the program and follow exercise prescription    Comments No  concerns identified    Expected Outcomes For pt to participate in PR.    Continue Psychosocial Services  No Follow up required             Psychosocial Re-Evaluation:   Psychosocial Discharge (Final Psychosocial Re-Evaluation):   Education: Education Goals: Education classes will be provided on a weekly basis, covering required topics. Participant will state understanding/return demonstration of topics presented.  Learning Barriers/Preferences:  Learning Barriers/Preferences - 10/02/21 1105       Learning Barriers/Preferences   Learning Barriers Sight    Learning Preferences Individual Instruction;Group Instruction             Education Topics: Introduction to Pulmonary Rehab Group instruction provided by PowerPoint, verbal discussion, and written material to support subject matter. Instructor reviews what Pulmonary Rehab is, the purpose of the program, and how patients are referred.     Know Your Numbers Group instruction that is supported by a PowerPoint presentation. Instructor discusses importance of knowing and understanding resting, exercise, and post-exercise oxygen saturation, heart rate, and blood pressure. Oxygen saturation, heart rate, blood pressure, rating of perceived exertion, and dyspnea are reviewed along with a normal range for these values.    Exercise for the Pulmonary Patient Group instruction that is supported by a PowerPoint presentation. Instructor discusses benefits of exercise, core components of exercise, frequency, duration, and intensity of an exercise routine, importance of utilizing pulse oximetry during exercise, safety while exercising, and options of places to exercise outside of rehab.       MET Level  Group instruction provided by PowerPoint, verbal discussion, and written material to support subject matter. Instructor reviews what METs are and how to increase METs.    Pulmonary Medications Verbally interactive group education  provided by instructor with focus on inhaled medications and proper administration.   Anatomy and Physiology of the Respiratory System Group instruction provided by PowerPoint, verbal discussion, and written material to support subject matter. Instructor reviews respiratory cycle and anatomical components of the respiratory system and their functions. Instructor also reviews differences in obstructive and restrictive respiratory diseases with examples of each.    Oxygen Safety Group instruction provided by PowerPoint, verbal discussion, and written material to support subject matter. There is an overview of "What is Oxygen" and "Why do we need it".  Instructor also reviews how to create a safe environment for oxygen use, the importance of using oxygen as prescribed, and the risks of noncompliance. There is a brief discussion on traveling with oxygen and resources the patient may utilize.   Oxygen Use Group instruction provided by PowerPoint, verbal discussion, and written material to discuss how supplemental oxygen is prescribed and different types of oxygen supply systems. Resources for more information are provided.    Breathing Techniques Group instruction that is supported by demonstration and informational handouts. Instructor discusses the benefits of pursed lip and diaphragmatic breathing and detailed demonstration on how to perform both.     Risk Factor Reduction Group instruction that is supported by a PowerPoint presentation. Instructor discusses the definition of a risk factor, different risk factors for pulmonary disease, and how the heart and lungs work together.   MD Day A group question and answer session with a medical doctor that allows participants to ask questions that relate to their pulmonary disease state.   Nutrition for the Pulmonary Patient Group instruction provided by PowerPoint slides, verbal discussion, and written materials to support subject  matter. The  instructor gives an explanation and review of healthy diet recommendations, which includes a discussion on weight management, recommendations for fruit and vegetable consumption, as well as protein, fluid, caffeine, fiber, sodium, sugar, and alcohol. Tips for eating when patients are short of breath are discussed.    Other Education Group or individual verbal, written, or video instructions that support the educational goals of the pulmonary rehab program.    Knowledge Questionnaire Score:  Knowledge Questionnaire Score - 10/02/21 1106       Knowledge Questionnaire Score   Pre Score 13/18             Core Components/Risk Factors/Patient Goals at Admission:  Personal Goals and Risk Factors at Admission - 10/02/21 1110       Core Components/Risk Factors/Patient Goals on Admission    Weight Management Weight Loss    Improve shortness of breath with ADL's Yes    Intervention Provide education, individualized exercise plan and daily activity instruction to help decrease symptoms of SOB with activities of daily living.    Expected Outcomes Short Term: Improve cardiorespiratory fitness to achieve a reduction of symptoms when performing ADLs;Long Term: Be able to perform more ADLs without symptoms or delay the onset of symptoms             Core Components/Risk Factors/Patient Goals Review:    Core Components/Risk Factors/Patient Goals at Discharge (Final Review):    ITP Comments:   Comments: Dr. Rodman Pickle is Medical Director for Pulmonary Rehab at [facility].

## 2021-10-05 ENCOUNTER — Telehealth: Payer: Self-pay | Admitting: Internal Medicine

## 2021-10-06 NOTE — Telephone Encounter (Signed)
Community msg sent to see what is needed exactly  They can already see notes in Epic  We are not faxing them any notes   Will await response

## 2021-10-09 NOTE — Telephone Encounter (Signed)
New, Cordie Grice, CMA; Weston Anna; 1 other looks like this is for DME o2, adding DME team to review.   Thank you,   Demetrius Charity    Will continue to follow

## 2021-10-10 ENCOUNTER — Encounter (HOSPITAL_COMMUNITY)
Admission: RE | Admit: 2021-10-10 | Discharge: 2021-10-10 | Disposition: A | Discharge status: Home / Self Care | Payer: Medicare Other | Source: Ambulatory Visit | Attending: Internal Medicine | Admitting: Internal Medicine

## 2021-10-10 DIAGNOSIS — J849 Interstitial pulmonary disease, unspecified: Secondary | ICD-10-CM

## 2021-10-10 NOTE — Progress Notes (Signed)
Daily Session Note  Patient Details  Name: Dustin Dean MRN: [id number] Date of Birth: 08-12-1950 Referring Provider:   April Manson Pulmonary Rehab Walk Test from 10/02/2021 in [place]  Referring Provider Ramaswamy       Encounter Date: 10/10/2021  Check In:  Session Check In - 10/10/21 1531       Check-In   Supervising physician immediately available to respond to emergencies [facility] - Physician supervision    Physician(s) Dr. Ernest Mallick    Location [facility]    Staff Present Elmon Else, MS, ACSM-CEP, Exercise Physiologist;Yarethzy Croak Yevonne Pax, ACSM-CEP, Exercise Physiologist;Carlette Wilber Oliphant, RN, BSN;Lisa Ysidro Evert, Felipe Drone, RN, MHA    Virtual Visit No    Medication changes reported     No    Fall or balance concerns reported    No    Tobacco Cessation No Change    Warm-up and Cool-down Performed as group-led instruction    Resistance Training Performed Yes    VAD Patient? No    PAD/SET Patient? No      Pain Assessment   Currently in Pain? No/denies    Multiple Pain Sites No             Capillary Blood Glucose: No results found for this or any previous visit (from the past 24 hour(s)).    Social History   Tobacco Use  Smoking Status Never   Passive exposure: Past  Smokeless Tobacco Never    Goals Met:  Exercise tolerated well No report of concerns or symptoms today Strength training completed today  Goals Unmet:  Not Applicable  Comments: Service time is from 1305 to 1450.    Dr. Rodman Pickle is Medical Director for Pulmonary Rehab at [facility].

## 2021-10-11 NOTE — Progress Notes (Deleted)
Pulmonary Individual Treatment Plan  Patient Details  Name: Dustin Dean MRN: [id number] Date of Birth: February 26, 1950 Referring Provider:   April Manson Pulmonary Rehab Walk Test from 10/02/2021 in [place]  Referring Provider [place]       Initial Encounter Date:  Flowsheet Row Pulmonary Rehab Walk Test from 10/02/2021 in [place]  Date 10/02/21       Visit Diagnosis: ILD (interstitial lung disease) ([place])  Patient's Home Medications on Admission:   Current Outpatient Medications:    acetaminophen (TYLENOL) 325 MG tablet, Take by mouth as needed., Disp: , Rfl:    aspirin-acetaminophen-caffeine (EXCEDRIN MIGRAINE) 250-250-65 MG tablet, Take by mouth as needed., Disp: , Rfl:    Calcium Carb-Cholecalciferol (CALCIUM/VITAMIN D PO), Take 1 tablet by mouth daily., Disp: , Rfl:    Cetirizine HCl (ZYRTEC PO), Take by mouth., Disp: , Rfl:    fluorouracil (EFUDEX) 5 % cream, Apply topically., Disp: , Rfl:    fluticasone (FLONASE) 50 MCG/ACT nasal spray, Place into both nostrils daily., Disp: , Rfl:    gabapentin (NEURONTIN) 300 MG capsule, Take 300 mg by mouth 3 (three) times daily., Disp: , Rfl:    loperamide (IMODIUM) 2 MG capsule, Take 1 mg by mouth once., Disp: , Rfl:    meclizine (ANTIVERT) 25 MG tablet, Take 25 mg by mouth 2 (two) times daily as needed., Disp: , Rfl:    Multiple Vitamin (MULTIVITAMIN) capsule, Take 1 capsule by mouth every morning., Disp: , Rfl:    naproxen sodium (ALEVE) 220 MG tablet, Take 220 mg by mouth daily as needed., Disp: , Rfl:    omeprazole (PRILOSEC) 40 MG capsule, Take 40 mg by mouth daily., Disp: , Rfl:    PAROXETINE HCL PO, Take by mouth., Disp: , Rfl:    Pirfenidone (ESBRIET) 267 MG TABS, Take 3 tablets (801 mg total) by mouth 3 (three) times daily with meals., Disp: 270 tablet, Rfl: 4   rosuvastatin (CRESTOR) 5 MG tablet, Take 1 tablet by mouth daily., Disp: , Rfl:    SUMAtriptan  (IMITREX) 100 MG tablet, TAKE ONE TABLET BY MOUTH AT ONSET OF HEADACHE; MAY REPEAT ONE TABLET IN 2 HOURS IF NEEDED. MAX OF 200 MG PER DAY, Disp: , Rfl:    albuterol (VENTOLIN HFA) 108 (90 Base) MCG/ACT inhaler, Inhale into the lungs. (Patient not taking: Reported on 09/08/2021), Disp: , Rfl:   Past Medical History: Past Medical History:  Diagnosis Date   Aortic stenosis    Flu    Pneumonia    Prostate cancer ([place])    Pulmonary fibrosis ([place])    Skin cancer     Tobacco Use: Social History   Tobacco Use  Smoking Status Never   Passive exposure: Past  Smokeless Tobacco Never    Labs: Review Flowsheet        No data to display          Capillary Blood Glucose: No results found for: "GLUCAP"   Pulmonary Assessment Scores:  Pulmonary Assessment Scores     Row Name 10/02/21 1119         ADL UCSD   ADL Phase Entry     SOB Score total 40       CAT Score   CAT Score 16       mMRC Score   mMRC Score 2             UCSD: Self-administered rating of dyspnea associated with activities of daily  living (ADLs) 6-point scale (0 = "not at all" to 5 = "maximal or unable to do because of breathlessness")  Scoring Scores range from 0 to 120.  Minimally important difference is 5 units  CAT: CAT can identify the health impairment of COPD patients and is better correlated with disease progression.  CAT has a scoring range of zero to 40. The CAT score is classified into four groups of low (less than 10), medium (10 - 20), high (21-30) and very high (31-40) based on the impact level of disease on health status. A CAT score over 10 suggests significant symptoms.  A worsening CAT score could be explained by an exacerbation, poor medication adherence, poor inhaler technique, or progression of COPD or comorbid conditions.  CAT MCID is 2 points  mMRC: mMRC (Modified Medical Research Council) Dyspnea Scale is used to assess the degree of baseline functional disability in patients  of respiratory disease due to dyspnea. No minimal important difference is established. A decrease in score of 1 point or greater is considered a positive change.   Pulmonary Function Assessment:  Pulmonary Function Assessment - 10/02/21 1122       Breath   Bilateral Breath Sounds Decreased;Rales;Wheezes    Shortness of Breath No;Limiting activity             Exercise Target Goals: Exercise Program Goal: Individual exercise prescription set using results from initial 6 min walk test and THRR while considering  patient's activity barriers and safety.   Exercise Prescription Goal: Initial exercise prescription builds to 30-45 minutes a day of aerobic activity, 2-3 days per week.  Home exercise guidelines will be given to patient during program as part of exercise prescription that the participant will acknowledge.  Activity Barriers & Risk Stratification:  Activity Barriers & Cardiac Risk Stratification - 10/02/21 1116       Activity Barriers & Cardiac Risk Stratification   Activity Barriers Muscular Weakness;Shortness of Breath;Deconditioning    Cardiac Risk Stratification Moderate             6 Minute Walk:  6 Minute Walk     Row Name 10/02/21 1223         6 Minute Walk   Phase Initial     Distance 1175 feet     Walk Time 6 minutes     # of Rest Breaks 0     MPH 2.23     METS 2.68     RPE 12     Perceived Dyspnea  1     VO2 Peak 9.37     Symptoms No     Resting HR 82 bpm     Resting BP 112/78     Resting Oxygen Saturation  97 %     Exercise Oxygen Saturation  during 6 min walk 92 %     Max Ex. HR 108 bpm     Max Ex. BP 138/76     2 Minute Post BP 130/76       Interval HR   1 Minute HR 106     2 Minute HR 108     3 Minute HR 108     4 Minute HR 108     5 Minute HR 108     6 Minute HR 107     2 Minute Post HR 77     Interval Heart Rate? Yes       Interval Oxygen   Interval Oxygen? Yes     Baseline Oxygen  Saturation % 97 %     1 Minute Oxygen  Saturation % 94 %     1 Minute Liters of Oxygen 2 L     2 Minute Oxygen Saturation % 92 %     2 Minute Liters of Oxygen 2 L     3 Minute Oxygen Saturation % 93 %     3 Minute Liters of Oxygen 2 L     4 Minute Oxygen Saturation % 92 %     4 Minute Liters of Oxygen 2 L     5 Minute Oxygen Saturation % 94 %     5 Minute Liters of Oxygen 2 L     6 Minute Oxygen Saturation % 96 %     6 Minute Liters of Oxygen 2 L     2 Minute Post Oxygen Saturation % 96 %     2 Minute Post Liters of Oxygen 2 L              Oxygen Initial Assessment:  Oxygen Initial Assessment - 10/02/21 1111       Home Oxygen   Home Oxygen Device Home Concentrator    Sleep Oxygen Prescription Continuous    Liters per minute 2.5    Home Exercise Oxygen Prescription Pulsed    Liters per minute 2    Home Resting Oxygen Prescription None    Compliance with Home Oxygen Use Yes      Initial 6 min Walk   Oxygen Used Continuous    Liters per minute 2      Program Oxygen Prescription   Program Oxygen Prescription Continuous    Liters per minute 2      Intervention   Short Term Goals To learn and exhibit compliance with exercise, home and travel O2 prescription;To learn and understand importance of monitoring SPO2 with pulse oximeter and demonstrate accurate use of the pulse oximeter.;To learn and understand importance of maintaining oxygen saturations>88%;To learn and demonstrate proper use of respiratory medications;To learn and demonstrate proper pursed lip breathing techniques or other breathing techniques.     Long  Term Goals Exhibits compliance with exercise, home  and travel O2 prescription;Verbalizes importance of monitoring SPO2 with pulse oximeter and return demonstration;Maintenance of O2 saturations>88%;Exhibits proper breathing techniques, such as pursed lip breathing or other method taught during program session;Compliance with respiratory medication;Demonstrates proper use of MDI's              Oxygen Re-Evaluation:  Oxygen Re-Evaluation     Row Name 10/09/21 1004             Program Oxygen Prescription   Program Oxygen Prescription Continuous       Liters per minute 2         Home Oxygen   Home Oxygen Device Home Concentrator       Sleep Oxygen Prescription Continuous       Liters per minute 2.5       Home Exercise Oxygen Prescription Pulsed       Liters per minute 2       Home Resting Oxygen Prescription None       Compliance with Home Oxygen Use Yes         Goals/Expected Outcomes   Short Term Goals To learn and exhibit compliance with exercise, home and travel O2 prescription;To learn and understand importance of monitoring SPO2 with pulse oximeter and demonstrate accurate use of the pulse oximeter.;To learn and understand importance of maintaining oxygen saturations>88%;To  learn and demonstrate proper use of respiratory medications;To learn and demonstrate proper pursed lip breathing techniques or other breathing techniques.        Long  Term Goals Exhibits compliance with exercise, home  and travel O2 prescription;Verbalizes importance of monitoring SPO2 with pulse oximeter and return demonstration;Maintenance of O2 saturations>88%;Exhibits proper breathing techniques, such as pursed lip breathing or other method taught during program session;Compliance with respiratory medication;Demonstrates proper use of MDI's       Goals/Expected Outcomes Compliance and understanding of oxygen saturation monitoring and breathing techniques to decrease shortness of breath.                Oxygen Discharge (Final Oxygen Re-Evaluation):  Oxygen Re-Evaluation - 10/09/21 1004       Program Oxygen Prescription   Program Oxygen Prescription Continuous    Liters per minute 2      Home Oxygen   Home Oxygen Device Home Concentrator    Sleep Oxygen Prescription Continuous    Liters per minute 2.5    Home Exercise Oxygen Prescription Pulsed    Liters per minute 2    Home  Resting Oxygen Prescription None    Compliance with Home Oxygen Use Yes      Goals/Expected Outcomes   Short Term Goals To learn and exhibit compliance with exercise, home and travel O2 prescription;To learn and understand importance of monitoring SPO2 with pulse oximeter and demonstrate accurate use of the pulse oximeter.;To learn and understand importance of maintaining oxygen saturations>88%;To learn and demonstrate proper use of respiratory medications;To learn and demonstrate proper pursed lip breathing techniques or other breathing techniques.     Long  Term Goals Exhibits compliance with exercise, home  and travel O2 prescription;Verbalizes importance of monitoring SPO2 with pulse oximeter and return demonstration;Maintenance of O2 saturations>88%;Exhibits proper breathing techniques, such as pursed lip breathing or other method taught during program session;Compliance with respiratory medication;Demonstrates proper use of MDI's    Goals/Expected Outcomes Compliance and understanding of oxygen saturation monitoring and breathing techniques to decrease shortness of breath.             Initial Exercise Prescription:  Initial Exercise Prescription - 10/02/21 1200       Date of Initial Exercise RX and Referring Provider   Date 10/02/21    Referring Provider Ramaswamy    Expected Discharge Date 12/05/21      Oxygen   Oxygen Continuous    Liters 2    Maintain Oxygen Saturation 88% or higher      Recumbant Bike   Level 2    RPM 60    Minutes 15      NuStep   Level 2    SPM 60    Minutes 15      Prescription Details   Frequency (times per week) 2    Duration Progress to 30 minutes of continuous aerobic without signs/symptoms of physical distress      Intensity   THRR 40-80% of Max Heartrate 60-121    Ratings of Perceived Exertion 11-13    Perceived Dyspnea 0-4      Progression   Progression Continue to progress workloads to maintain intensity without signs/symptoms of  physical distress.      Resistance Training   Training Prescription Yes    Weight blue bands    Reps 10-15             Perform Capillary Blood Glucose checks as needed.  Exercise Prescription Changes:   Exercise Comments:  Exercise Comments     Row Name 10/10/21 1538           Exercise Comments Pt completed his first day of exercise. He exercised on nustep for 15 min, level 2, 1.6 METs. He then attempted scifit bike however could not get comfortable on seat. He instead walked the track, METs 1.86. tolerated well. Discussed METs with understanding. Performed warm up and cool down standing, tolerated well.                Exercise Goals and Review:   Exercise Goals     Row Name 10/02/21 1238 10/09/21 1003           Exercise Goals   Increase Physical Activity Yes Yes      Intervention Provide advice, education, support and counseling about physical activity/exercise needs.;Develop an individualized exercise prescription for aerobic and resistive training based on initial evaluation findings, risk stratification, comorbidities and participant's personal goals. Provide advice, education, support and counseling about physical activity/exercise needs.;Develop an individualized exercise prescription for aerobic and resistive training based on initial evaluation findings, risk stratification, comorbidities and participant's personal goals.      Expected Outcomes Short Term: Attend rehab on a regular basis to increase amount of physical activity.;Long Term: Add in home exercise to make exercise part of routine and to increase amount of physical activity.;Long Term: Exercising regularly at least 3-5 days a week. Short Term: Attend rehab on a regular basis to increase amount of physical activity.;Long Term: Add in home exercise to make exercise part of routine and to increase amount of physical activity.;Long Term: Exercising regularly at least 3-5 days a week.      Increase  Strength and Stamina Yes Yes      Intervention Provide advice, education, support and counseling about physical activity/exercise needs.;Develop an individualized exercise prescription for aerobic and resistive training based on initial evaluation findings, risk stratification, comorbidities and participant's personal goals. Provide advice, education, support and counseling about physical activity/exercise needs.;Develop an individualized exercise prescription for aerobic and resistive training based on initial evaluation findings, risk stratification, comorbidities and participant's personal goals.      Expected Outcomes Short Term: Increase workloads from initial exercise prescription for resistance, speed, and METs.;Short Term: Perform resistance training exercises routinely during rehab and add in resistance training at home;Long Term: Improve cardiorespiratory fitness, muscular endurance and strength as measured by increased METs and functional capacity (6MWT) Short Term: Increase workloads from initial exercise prescription for resistance, speed, and METs.;Short Term: Perform resistance training exercises routinely during rehab and add in resistance training at home;Long Term: Improve cardiorespiratory fitness, muscular endurance and strength as measured by increased METs and functional capacity (6MWT)      Able to understand and use rate of perceived exertion (RPE) scale Yes Yes      Intervention Provide education and explanation on how to use RPE scale Provide education and explanation on how to use RPE scale      Expected Outcomes Short Term: Able to use RPE daily in rehab to express subjective intensity level;Long Term:  Able to use RPE to guide intensity level when exercising independently Short Term: Able to use RPE daily in rehab to express subjective intensity level;Long Term:  Able to use RPE to guide intensity level when exercising independently      Able to understand and use Dyspnea scale Yes  Yes      Intervention Provide education and explanation on how to use Dyspnea scale Provide education and explanation  on how to use Dyspnea scale      Expected Outcomes Short Term: Able to use Dyspnea scale daily in rehab to express subjective sense of shortness of breath during exertion;Long Term: Able to use Dyspnea scale to guide intensity level when exercising independently Short Term: Able to use Dyspnea scale daily in rehab to express subjective sense of shortness of breath during exertion;Long Term: Able to use Dyspnea scale to guide intensity level when exercising independently      Knowledge and understanding of Target Heart Rate Range (THRR) Yes Yes      Intervention Provide education and explanation of THRR including how the numbers were predicted and where they are located for reference Provide education and explanation of THRR including how the numbers were predicted and where they are located for reference      Expected Outcomes Short Term: Able to state/look up THRR;Long Term: Able to use THRR to govern intensity when exercising independently;Short Term: Able to use daily as guideline for intensity in rehab Short Term: Able to state/look up THRR;Long Term: Able to use THRR to govern intensity when exercising independently;Short Term: Able to use daily as guideline for intensity in rehab      Understanding of Exercise Prescription Yes Yes      Intervention Provide education, explanation, and written materials on patient's individual exercise prescription Provide education, explanation, and written materials on patient's individual exercise prescription      Expected Outcomes Short Term: Able to explain program exercise prescription;Long Term: Able to explain home exercise prescription to exercise independently Short Term: Able to explain program exercise prescription;Long Term: Able to explain home exercise prescription to exercise independently               Exercise Goals  Re-Evaluation :  Exercise Goals Re-Evaluation     Row Name 10/09/21 1003             Exercise Goal Re-Evaluation   Exercise Goals Review Increase Physical Activity;Increase Strength and Stamina;Able to understand and use rate of perceived exertion (RPE) scale;Able to understand and use Dyspnea scale;Knowledge and understanding of Target Heart Rate Range (THRR);Understanding of Exercise Prescription       Comments Pt is to begin exercise this week. Will monitor and progress as tolerated.       Expected Outcomes Through exercise at rehab and home, the patient will decrease shortness of breath with daily activities and feel confident in carrying out an exercise regimen at home.                Discharge Exercise Prescription (Final Exercise Prescription Changes):   Nutrition:  Target Goals: Understanding of nutrition guidelines, daily intake of sodium <1552m, cholesterol <209m calories 30% from fat and 7% or less from saturated fats, daily to have 5 or more servings of fruits and vegetables.  Biometrics:  Pre Biometrics - 10/02/21 1238       Pre Biometrics   Grip Strength 24 kg              Nutrition Therapy Plan and Nutrition Goals:  Nutrition Therapy & Goals - 10/10/21 1555       Nutrition Therapy   Diet Heart Healthy Diet    Drug/Food Interactions Statins/Certain Fruits      Personal Nutrition Goals   Nutrition Goal Patient to use the plate method as a daily guide for meal planning to include lean protein/plant protein, fruit, vegetables, whole grains, nonfat dairy as part of heart healthy lifestyle  Personal Goal #2 Patient to identify strategies for weight loss of 0.5-2.0# per week.    Personal Goal #3 Patient to identify food sources and limit daily intake of saturated fat, trans fat, sodium, and refined carbohydrates    Comments Dustin Dean reports motivation to work toward weight loss. He reports eating three meals per day; he does enjoy a wide variety of foods.  He lives at home with with his wife who does the majority of the grocery shopping and cooking. She is supportive of lifestyle changes.      Intervention Plan   Intervention Prescribe, educate and counsel regarding individualized specific dietary modifications aiming towards targeted core components such as weight, hypertension, lipid management, diabetes, heart failure and other comorbidities.;Nutrition handout(s) given to patient.    Expected Outcomes Short Term Goal: Understand basic principles of dietary content, such as calories, fat, sodium, cholesterol and nutrients.;Long Term Goal: Adherence to prescribed nutrition plan.             Nutrition Assessments:  MEDIFICTS Score Key: ?70 Need to make dietary changes  40-70 Heart Healthy Diet ? 40 Therapeutic Level Cholesterol Diet   Picture Your Plate Scores: <44 Unhealthy dietary pattern with much room for improvement. 41-50 Dietary pattern unlikely to meet recommendations for good health and room for improvement. 51-60 More healthful dietary pattern, with some room for improvement.  >60 Healthy dietary pattern, although there may be some specific behaviors that could be improved.    Nutrition Goals Re-Evaluation:  Nutrition Goals Re-Evaluation     [place] Name 10/10/21 1555             Goals   Current Weight 210 lb 1.6 oz (95.3 kg)       Comment A1c WNL, Lipid panel WNL except HDL 37       Expected Outcome Dustin Dean reports motivation to work toward weight loss. He reports eating three meals per day; he does enjoy a wide variety of foods. He lives at home with with his wife who does the majority of the grocery shopping and cooking. She is supportive of lifestyle changes.                Nutrition Goals Discharge (Final Nutrition Goals Re-Evaluation):  Nutrition Goals Re-Evaluation - 10/10/21 1555       Goals   Current Weight 210 lb 1.6 oz (95.3 kg)    Comment A1c WNL, Lipid panel WNL except HDL 37    Expected Outcome Dustin Dean  reports motivation to work toward weight loss. He reports eating three meals per day; he does enjoy a wide variety of foods. He lives at home with with his wife who does the majority of the grocery shopping and cooking. She is supportive of lifestyle changes.             Psychosocial: Target Goals: Acknowledge presence or absence of significant depression and/or stress, maximize coping skills, provide positive support system. Participant is able to verbalize types and ability to use techniques and skills needed for reducing stress and depression.  Initial Review & Psychosocial Screening:  Initial Psych Review & Screening - 10/02/21 1102       Initial Review   Current issues with None Identified      Family Dynamics   Good Support System? Yes    Comments wife Dustin Dean, adoptive children Dustin Dean and his wife      Screening Interventions   Interventions Encouraged to exercise    Expected Outcomes Long Term goal: The participant improves  quality of Life and PHQ9 Scores as seen by post scores and/or verbalization of changes;Short Term goal: Identification and review with participant of any Quality of Life or Depression concerns found by scoring the questionnaire.;Long Term Goal: Stressors or current issues are controlled or eliminated.;Short Term goal: Utilizing psychosocial counselor, staff and physician to assist with identification of specific Stressors or current issues interfering with healing process. Setting desired goal for each stressor or current issue identified.             Quality of Life Scores:  Scores of 19 and below usually indicate a poorer quality of life in these areas.  A difference of  2-3 points is a clinically meaningful difference.  A difference of 2-3 points in the total score of the Quality of Life Index has been associated with significant improvement in overall quality of life, self-image, physical symptoms, and general health in studies assessing change in  quality of life.  PHQ-9: Review Flowsheet       10/02/2021  Depression screen PHQ 2/9  Decreased Interest 0 0  Down, Depressed, Hopeless 0 0  PHQ - 2 Score 0 0  Altered sleeping 0  Tired, decreased energy 0  Change in appetite 0  Feeling bad or failure about yourself  0  Trouble concentrating 0  Moving slowly or fidgety/restless 0  Suicidal thoughts 0  PHQ-9 Score 0  Difficult doing work/chores Not difficult at all   Interpretation of Total Score  Total Score Depression Severity:  1-4 = Minimal depression, 5-9 = Mild depression, 10-14 = Moderate depression, 15-19 = Moderately severe depression, 20-27 = Severe depression   Psychosocial Evaluation and Intervention:  Psychosocial Evaluation - 10/02/21 1103       Psychosocial Evaluation & Interventions   Interventions Encouraged to exercise with the program and follow exercise prescription    Comments No concerns identified    Expected Outcomes For pt to participate in PR.    Continue Psychosocial Services  No Follow up required             Psychosocial Re-Evaluation:  Psychosocial Re-Evaluation     [place] Name 10/11/21 1122             Psychosocial Re-Evaluation   Current issues with None Identified       Comments Dustin Dean has just started in the PR program and ahs attended one session. We will continue to monitor for any psychosocial needs.       Expected Outcomes For him to attend the program without any psychosocial issues or concerns.       Interventions Encouraged to attend Pulmonary Rehabilitation for the exercise       Continue Psychosocial Services  No Follow up required                Psychosocial Discharge (Final Psychosocial Re-Evaluation):  Psychosocial Re-Evaluation - 10/11/21 1122       Psychosocial Re-Evaluation   Current issues with None Identified    Comments Dustin Dean has just started in the PR program and ahs attended one session. We will continue to monitor for any psychosocial needs.     Expected Outcomes For him to attend the program without any psychosocial issues or concerns.    Interventions Encouraged to attend Pulmonary Rehabilitation for the exercise    Continue Psychosocial Services  No Follow up required             Education: Education Goals: Education classes will be provided on a weekly basis, covering  required topics. Participant will state understanding/return demonstration of topics presented.  Learning Barriers/Preferences:  Learning Barriers/Preferences - 10/02/21 1105       Learning Barriers/Preferences   Learning Barriers Sight    Learning Preferences Individual Instruction;Group Instruction             Education Topics: Introduction to Pulmonary Rehab Group instruction provided by PowerPoint, verbal discussion, and written material to support subject matter. Instructor reviews what Pulmonary Rehab is, the purpose of the program, and how patients are referred.     Know Your Numbers Group instruction that is supported by a PowerPoint presentation. Instructor discusses importance of knowing and understanding resting, exercise, and post-exercise oxygen saturation, heart rate, and blood pressure. Oxygen saturation, heart rate, blood pressure, rating of perceived exertion, and dyspnea are reviewed along with a normal range for these values.    Exercise for the Pulmonary Patient Group instruction that is supported by a PowerPoint presentation. Instructor discusses benefits of exercise, core components of exercise, frequency, duration, and intensity of an exercise routine, importance of utilizing pulse oximetry during exercise, safety while exercising, and options of places to exercise outside of rehab.       MET Level  Group instruction provided by PowerPoint, verbal discussion, and written material to support subject matter. Instructor reviews what METs are and how to increase METs.    Pulmonary Medications Verbally interactive group  education provided by instructor with focus on inhaled medications and proper administration.   Anatomy and Physiology of the Respiratory System Group instruction provided by PowerPoint, verbal discussion, and written material to support subject matter. Instructor reviews respiratory cycle and anatomical components of the respiratory system and their functions. Instructor also reviews differences in obstructive and restrictive respiratory diseases with examples of each.    Oxygen Safety Group instruction provided by PowerPoint, verbal discussion, and written material to support subject matter. There is an overview of "What is Oxygen" and "Why do we need it".  Instructor also reviews how to create a safe environment for oxygen use, the importance of using oxygen as prescribed, and the risks of noncompliance. There is a brief discussion on traveling with oxygen and resources the patient may utilize.   Oxygen Use Group instruction provided by PowerPoint, verbal discussion, and written material to discuss how supplemental oxygen is prescribed and different types of oxygen supply systems. Resources for more information are provided.    Breathing Techniques Group instruction that is supported by demonstration and informational handouts. Instructor discusses the benefits of pursed lip and diaphragmatic breathing and detailed demonstration on how to perform both.     Risk Factor Reduction Group instruction that is supported by a PowerPoint presentation. Instructor discusses the definition of a risk factor, different risk factors for pulmonary disease, and how the heart and lungs work together.   MD Day A group question and answer session with a medical doctor that allows participants to ask questions that relate to their pulmonary disease state.   Nutrition for the Pulmonary Patient Group instruction provided by PowerPoint slides, verbal discussion, and written materials to support subject  matter. The instructor gives an explanation and review of healthy diet recommendations, which includes a discussion on weight management, recommendations for fruit and vegetable consumption, as well as protein, fluid, caffeine, fiber, sodium, sugar, and alcohol. Tips for eating when patients are short of breath are discussed.    Other Education Group or individual verbal, written, or video instructions that support the educational goals of the pulmonary rehab program.  Knowledge Questionnaire Score:  Knowledge Questionnaire Score - 10/02/21 1106       Knowledge Questionnaire Score   Pre Score 13/18             Core Components/Risk Factors/Patient Goals at Admission:  Personal Goals and Risk Factors at Admission - 10/02/21 1110       Core Components/Risk Factors/Patient Goals on Admission    Weight Management Weight Loss    Improve shortness of breath with ADL's Yes    Intervention Provide education, individualized exercise plan and daily activity instruction to help decrease symptoms of SOB with activities of daily living.    Expected Outcomes Short Term: Improve cardiorespiratory fitness to achieve a reduction of symptoms when performing ADLs;Long Term: Be able to perform more ADLs without symptoms or delay the onset of symptoms             Core Components/Risk Factors/Patient Goals Review:   Goals and Risk Factor Review     Row Name 10/11/21 1125             Core Components/Risk Factors/Patient Goals Review   Personal Goals Review Weight Management/Obesity;Improve shortness of breath with ADL's;Develop more efficient breathing techniques such as purse lipped breathing and diaphragmatic breathing and practicing self-pacing with activity.;Increase knowledge of respiratory medications and ability to use respiratory devices properly.       Review Dustin Dean has just started the program and attended one session. We will continue to monitor his weight and other core  components as he progress through the program.       Expected Outcomes See admission goals.                Core Components/Risk Factors/Patient Goals at Discharge (Final Review):   Goals and Risk Factor Review - 10/11/21 1125       Core Components/Risk Factors/Patient Goals Review   Personal Goals Review Weight Management/Obesity;Improve shortness of breath with ADL's;Develop more efficient breathing techniques such as purse lipped breathing and diaphragmatic breathing and practicing self-pacing with activity.;Increase knowledge of respiratory medications and ability to use respiratory devices properly.    Review Dustin Dean has just started the program and attended one session. We will continue to monitor his weight and other core components as he progress through the program.    Expected Outcomes See admission goals.             ITP Comments: Pt is making expected progress toward Pulmonary Rehab goals after completing 1 session. Recommend continued exercise, life style modification, education, and utilization of breathing techniques to increase stamina and strength, while also decreasing shortness of breath with exertion.  Dr. Rodman Pickle is Medical Director for Pulmonary Rehab at [facility].

## 2021-10-11 NOTE — Progress Notes (Signed)
Pulmonary Individual Treatment Plan  Patient Details  Name: Dustin Dean MRN: [id number] Date of Birth: 08-05-1950 Referring Provider:   April Manson Pulmonary Rehab Walk Test from 10/02/2021 in [place]  Referring Provider [place]       Initial Encounter Date:  Flowsheet Row Pulmonary Rehab Walk Test from 10/02/2021 in [place]  Date 10/02/21       Visit Diagnosis: ILD (interstitial lung disease) ([place])  Patient's Home Medications on Admission:   Current Outpatient Medications:    acetaminophen (TYLENOL) 325 MG tablet, Take by mouth as needed., Disp: , Rfl:    albuterol (VENTOLIN HFA) 108 (90 Base) MCG/ACT inhaler, Inhale into the lungs. (Patient not taking: Reported on 09/08/2021), Disp: , Rfl:    aspirin-acetaminophen-caffeine (EXCEDRIN MIGRAINE) 250-250-65 MG tablet, Take by mouth as needed., Disp: , Rfl:    Calcium Carb-Cholecalciferol (CALCIUM/VITAMIN D PO), Take 1 tablet by mouth daily., Disp: , Rfl:    Cetirizine HCl (ZYRTEC PO), Take by mouth., Disp: , Rfl:    fluorouracil (EFUDEX) 5 % cream, Apply topically., Disp: , Rfl:    fluticasone (FLONASE) 50 MCG/ACT nasal spray, Place into both nostrils daily., Disp: , Rfl:    gabapentin (NEURONTIN) 300 MG capsule, Take 300 mg by mouth 3 (three) times daily., Disp: , Rfl:    loperamide (IMODIUM) 2 MG capsule, Take 1 mg by mouth once., Disp: , Rfl:    meclizine (ANTIVERT) 25 MG tablet, Take 25 mg by mouth 2 (two) times daily as needed., Disp: , Rfl:    Multiple Vitamin (MULTIVITAMIN) capsule, Take 1 capsule by mouth every morning., Disp: , Rfl:    naproxen sodium (ALEVE) 220 MG tablet, Take 220 mg by mouth daily as needed., Disp: , Rfl:    omeprazole (PRILOSEC) 40 MG capsule, Take 40 mg by mouth daily., Disp: , Rfl:    PAROXETINE HCL PO, Take by mouth., Disp: , Rfl:    Pirfenidone (ESBRIET) 267 MG TABS, Take 3 tablets (801 mg total) by mouth 3 (three) times  daily with meals., Disp: 270 tablet, Rfl: 4   rosuvastatin (CRESTOR) 5 MG tablet, Take 1 tablet by mouth daily., Disp: , Rfl:    SUMAtriptan (IMITREX) 100 MG tablet, TAKE ONE TABLET BY MOUTH AT ONSET OF HEADACHE; MAY REPEAT ONE TABLET IN 2 HOURS IF NEEDED. MAX OF 200 MG PER DAY, Disp: , Rfl:   Past Medical History: Past Medical History:  Diagnosis Date   Aortic stenosis    Flu    Pneumonia    Prostate cancer ([place])    Pulmonary fibrosis ([place])    Skin cancer     Tobacco Use: Social History   Tobacco Use  Smoking Status Never   Passive exposure: Past  Smokeless Tobacco Never    Labs: Review Flowsheet        No data to display          Capillary Blood Glucose: No results found for: "GLUCAP"   Pulmonary Assessment Scores:  Pulmonary Assessment Scores     Row Name 10/02/21 1119         ADL UCSD   ADL Phase Entry     SOB Score total 40       CAT Score   CAT Score 16       mMRC Score   mMRC Score 2             UCSD: Self-administered rating of dyspnea associated with activities of daily  living (ADLs) 6-point scale (0 = "not at all" to 5 = "maximal or unable to do because of breathlessness")  Scoring Scores range from 0 to 120.  Minimally important difference is 5 units  CAT: CAT can identify the health impairment of COPD patients and is better correlated with disease progression.  CAT has a scoring range of zero to 40. The CAT score is classified into four groups of low (less than 10), medium (10 - 20), high (21-30) and very high (31-40) based on the impact level of disease on health status. A CAT score over 10 suggests significant symptoms.  A worsening CAT score could be explained by an exacerbation, poor medication adherence, poor inhaler technique, or progression of COPD or comorbid conditions.  CAT MCID is 2 points  mMRC: mMRC (Modified Medical Research Council) Dyspnea Scale is used to assess the degree of baseline functional disability in patients  of respiratory disease due to dyspnea. No minimal important difference is established. A decrease in score of 1 point or greater is considered a positive change.   Pulmonary Function Assessment:  Pulmonary Function Assessment - 10/02/21 1122       Breath   Bilateral Breath Sounds Decreased;Rales;Wheezes    Shortness of Breath No;Limiting activity             Exercise Target Goals: Exercise Program Goal: Individual exercise prescription set using results from initial 6 min walk test and THRR while considering  patient's activity barriers and safety.   Exercise Prescription Goal: Initial exercise prescription builds to 30-45 minutes a day of aerobic activity, 2-3 days per week.  Home exercise guidelines will be given to patient during program as part of exercise prescription that the participant will acknowledge.  Activity Barriers & Risk Stratification:  Activity Barriers & Cardiac Risk Stratification - 10/02/21 1116       Activity Barriers & Cardiac Risk Stratification   Activity Barriers Muscular Weakness;Shortness of Breath;Deconditioning    Cardiac Risk Stratification Moderate             6 Minute Walk:  6 Minute Walk     Row Name 10/02/21 1223         6 Minute Walk   Phase Initial     Distance 1175 feet     Walk Time 6 minutes     # of Rest Breaks 0     MPH 2.23     METS 2.68     RPE 12     Perceived Dyspnea  1     VO2 Peak 9.37     Symptoms No     Resting HR 82 bpm     Resting BP 112/78     Resting Oxygen Saturation  97 %     Exercise Oxygen Saturation  during 6 min walk 92 %     Max Ex. HR 108 bpm     Max Ex. BP 138/76     2 Minute Post BP 130/76       Interval HR   1 Minute HR 106     2 Minute HR 108     3 Minute HR 108     4 Minute HR 108     5 Minute HR 108     6 Minute HR 107     2 Minute Post HR 77     Interval Heart Rate? Yes       Interval Oxygen   Interval Oxygen? Yes     Baseline Oxygen  Saturation % 97 %     1 Minute Oxygen  Saturation % 94 %     1 Minute Liters of Oxygen 2 L     2 Minute Oxygen Saturation % 92 %     2 Minute Liters of Oxygen 2 L     3 Minute Oxygen Saturation % 93 %     3 Minute Liters of Oxygen 2 L     4 Minute Oxygen Saturation % 92 %     4 Minute Liters of Oxygen 2 L     5 Minute Oxygen Saturation % 94 %     5 Minute Liters of Oxygen 2 L     6 Minute Oxygen Saturation % 96 %     6 Minute Liters of Oxygen 2 L     2 Minute Post Oxygen Saturation % 96 %     2 Minute Post Liters of Oxygen 2 L              Oxygen Initial Assessment:  Oxygen Initial Assessment - 10/02/21 1111       Home Oxygen   Home Oxygen Device Home Concentrator    Sleep Oxygen Prescription Continuous    Liters per minute 2.5    Home Exercise Oxygen Prescription Pulsed    Liters per minute 2    Home Resting Oxygen Prescription None    Compliance with Home Oxygen Use Yes      Initial 6 min Walk   Oxygen Used Continuous    Liters per minute 2      Program Oxygen Prescription   Program Oxygen Prescription Continuous    Liters per minute 2      Intervention   Short Term Goals To learn and exhibit compliance with exercise, home and travel O2 prescription;To learn and understand importance of monitoring SPO2 with pulse oximeter and demonstrate accurate use of the pulse oximeter.;To learn and understand importance of maintaining oxygen saturations>88%;To learn and demonstrate proper use of respiratory medications;To learn and demonstrate proper pursed lip breathing techniques or other breathing techniques.     Long  Term Goals Exhibits compliance with exercise, home  and travel O2 prescription;Verbalizes importance of monitoring SPO2 with pulse oximeter and return demonstration;Maintenance of O2 saturations>88%;Exhibits proper breathing techniques, such as pursed lip breathing or other method taught during program session;Compliance with respiratory medication;Demonstrates proper use of MDI's              Oxygen Re-Evaluation:  Oxygen Re-Evaluation     Row Name 10/09/21 1004             Program Oxygen Prescription   Program Oxygen Prescription Continuous       Liters per minute 2         Home Oxygen   Home Oxygen Device Home Concentrator       Sleep Oxygen Prescription Continuous       Liters per minute 2.5       Home Exercise Oxygen Prescription Pulsed       Liters per minute 2       Home Resting Oxygen Prescription None       Compliance with Home Oxygen Use Yes         Goals/Expected Outcomes   Short Term Goals To learn and exhibit compliance with exercise, home and travel O2 prescription;To learn and understand importance of monitoring SPO2 with pulse oximeter and demonstrate accurate use of the pulse oximeter.;To learn and understand importance of maintaining oxygen saturations>88%;To  learn and demonstrate proper use of respiratory medications;To learn and demonstrate proper pursed lip breathing techniques or other breathing techniques.        Long  Term Goals Exhibits compliance with exercise, home  and travel O2 prescription;Verbalizes importance of monitoring SPO2 with pulse oximeter and return demonstration;Maintenance of O2 saturations>88%;Exhibits proper breathing techniques, such as pursed lip breathing or other method taught during program session;Compliance with respiratory medication;Demonstrates proper use of MDI's       Goals/Expected Outcomes Compliance and understanding of oxygen saturation monitoring and breathing techniques to decrease shortness of breath.                Oxygen Discharge (Final Oxygen Re-Evaluation):  Oxygen Re-Evaluation - 10/09/21 1004       Program Oxygen Prescription   Program Oxygen Prescription Continuous    Liters per minute 2      Home Oxygen   Home Oxygen Device Home Concentrator    Sleep Oxygen Prescription Continuous    Liters per minute 2.5    Home Exercise Oxygen Prescription Pulsed    Liters per minute 2    Home  Resting Oxygen Prescription None    Compliance with Home Oxygen Use Yes      Goals/Expected Outcomes   Short Term Goals To learn and exhibit compliance with exercise, home and travel O2 prescription;To learn and understand importance of monitoring SPO2 with pulse oximeter and demonstrate accurate use of the pulse oximeter.;To learn and understand importance of maintaining oxygen saturations>88%;To learn and demonstrate proper use of respiratory medications;To learn and demonstrate proper pursed lip breathing techniques or other breathing techniques.     Long  Term Goals Exhibits compliance with exercise, home  and travel O2 prescription;Verbalizes importance of monitoring SPO2 with pulse oximeter and return demonstration;Maintenance of O2 saturations>88%;Exhibits proper breathing techniques, such as pursed lip breathing or other method taught during program session;Compliance with respiratory medication;Demonstrates proper use of MDI's    Goals/Expected Outcomes Compliance and understanding of oxygen saturation monitoring and breathing techniques to decrease shortness of breath.             Initial Exercise Prescription:  Initial Exercise Prescription - 10/02/21 1200       Date of Initial Exercise RX and Referring Provider   Date 10/02/21    Referring Provider Ramaswamy    Expected Discharge Date 12/05/21      Oxygen   Oxygen Continuous    Liters 2    Maintain Oxygen Saturation 88% or higher      Recumbant Bike   Level 2    RPM 60    Minutes 15      NuStep   Level 2    SPM 60    Minutes 15      Prescription Details   Frequency (times per week) 2    Duration Progress to 30 minutes of continuous aerobic without signs/symptoms of physical distress      Intensity   THRR 40-80% of Max Heartrate 60-121    Ratings of Perceived Exertion 11-13    Perceived Dyspnea 0-4      Progression   Progression Continue to progress workloads to maintain intensity without signs/symptoms of  physical distress.      Resistance Training   Training Prescription Yes    Weight blue bands    Reps 10-15             Perform Capillary Blood Glucose checks as needed.  Exercise Prescription Changes:   Exercise Comments:  Exercise Comments     Row Name 10/10/21 1538           Exercise Comments Pt completed his first day of exercise. He exercised on nustep for 15 min, level 2, 1.6 METs. He then attempted scifit bike however could not get comfortable on seat. He instead walked the track, METs 1.86. tolerated well. Discussed METs with understanding. Performed warm up and cool down standing, tolerated well.                Exercise Goals and Review:   Exercise Goals     Row Name 10/02/21 1238 10/09/21 1003           Exercise Goals   Increase Physical Activity Yes Yes      Intervention Provide advice, education, support and counseling about physical activity/exercise needs.;Develop an individualized exercise prescription for aerobic and resistive training based on initial evaluation findings, risk stratification, comorbidities and participant's personal goals. Provide advice, education, support and counseling about physical activity/exercise needs.;Develop an individualized exercise prescription for aerobic and resistive training based on initial evaluation findings, risk stratification, comorbidities and participant's personal goals.      Expected Outcomes Short Term: Attend rehab on a regular basis to increase amount of physical activity.;Long Term: Add in home exercise to make exercise part of routine and to increase amount of physical activity.;Long Term: Exercising regularly at least 3-5 days a week. Short Term: Attend rehab on a regular basis to increase amount of physical activity.;Long Term: Add in home exercise to make exercise part of routine and to increase amount of physical activity.;Long Term: Exercising regularly at least 3-5 days a week.      Increase  Strength and Stamina Yes Yes      Intervention Provide advice, education, support and counseling about physical activity/exercise needs.;Develop an individualized exercise prescription for aerobic and resistive training based on initial evaluation findings, risk stratification, comorbidities and participant's personal goals. Provide advice, education, support and counseling about physical activity/exercise needs.;Develop an individualized exercise prescription for aerobic and resistive training based on initial evaluation findings, risk stratification, comorbidities and participant's personal goals.      Expected Outcomes Short Term: Increase workloads from initial exercise prescription for resistance, speed, and METs.;Short Term: Perform resistance training exercises routinely during rehab and add in resistance training at home;Long Term: Improve cardiorespiratory fitness, muscular endurance and strength as measured by increased METs and functional capacity (6MWT) Short Term: Increase workloads from initial exercise prescription for resistance, speed, and METs.;Short Term: Perform resistance training exercises routinely during rehab and add in resistance training at home;Long Term: Improve cardiorespiratory fitness, muscular endurance and strength as measured by increased METs and functional capacity (6MWT)      Able to understand and use rate of perceived exertion (RPE) scale Yes Yes      Intervention Provide education and explanation on how to use RPE scale Provide education and explanation on how to use RPE scale      Expected Outcomes Short Term: Able to use RPE daily in rehab to express subjective intensity level;Long Term:  Able to use RPE to guide intensity level when exercising independently Short Term: Able to use RPE daily in rehab to express subjective intensity level;Long Term:  Able to use RPE to guide intensity level when exercising independently      Able to understand and use Dyspnea scale Yes  Yes      Intervention Provide education and explanation on how to use Dyspnea scale Provide education and explanation  on how to use Dyspnea scale      Expected Outcomes Short Term: Able to use Dyspnea scale daily in rehab to express subjective sense of shortness of breath during exertion;Long Term: Able to use Dyspnea scale to guide intensity level when exercising independently Short Term: Able to use Dyspnea scale daily in rehab to express subjective sense of shortness of breath during exertion;Long Term: Able to use Dyspnea scale to guide intensity level when exercising independently      Knowledge and understanding of Target Heart Rate Range (THRR) Yes Yes      Intervention Provide education and explanation of THRR including how the numbers were predicted and where they are located for reference Provide education and explanation of THRR including how the numbers were predicted and where they are located for reference      Expected Outcomes Short Term: Able to state/look up THRR;Long Term: Able to use THRR to govern intensity when exercising independently;Short Term: Able to use daily as guideline for intensity in rehab Short Term: Able to state/look up THRR;Long Term: Able to use THRR to govern intensity when exercising independently;Short Term: Able to use daily as guideline for intensity in rehab      Understanding of Exercise Prescription Yes Yes      Intervention Provide education, explanation, and written materials on patient's individual exercise prescription Provide education, explanation, and written materials on patient's individual exercise prescription      Expected Outcomes Short Term: Able to explain program exercise prescription;Long Term: Able to explain home exercise prescription to exercise independently Short Term: Able to explain program exercise prescription;Long Term: Able to explain home exercise prescription to exercise independently               Exercise Goals  Re-Evaluation :  Exercise Goals Re-Evaluation     Row Name 10/09/21 1003             Exercise Goal Re-Evaluation   Exercise Goals Review Increase Physical Activity;Increase Strength and Stamina;Able to understand and use rate of perceived exertion (RPE) scale;Able to understand and use Dyspnea scale;Knowledge and understanding of Target Heart Rate Range (THRR);Understanding of Exercise Prescription       Comments Pt is to begin exercise this week. Will monitor and progress as tolerated.       Expected Outcomes Through exercise at rehab and home, the patient will decrease shortness of breath with daily activities and feel confident in carrying out an exercise regimen at home.                Discharge Exercise Prescription (Final Exercise Prescription Changes):   Nutrition:  Target Goals: Understanding of nutrition guidelines, daily intake of sodium <1552m, cholesterol <209m calories 30% from fat and 7% or less from saturated fats, daily to have 5 or more servings of fruits and vegetables.  Biometrics:  Pre Biometrics - 10/02/21 1238       Pre Biometrics   Grip Strength 24 kg              Nutrition Therapy Plan and Nutrition Goals:  Nutrition Therapy & Goals - 10/10/21 1555       Nutrition Therapy   Diet Heart Healthy Diet    Drug/Food Interactions Statins/Certain Fruits      Personal Nutrition Goals   Nutrition Goal Patient to use the plate method as a daily guide for meal planning to include lean protein/plant protein, fruit, vegetables, whole grains, nonfat dairy as part of heart healthy lifestyle  Personal Goal #2 Patient to identify strategies for weight loss of 0.5-2.0# per week.    Personal Goal #3 Patient to identify food sources and limit daily intake of saturated fat, trans fat, sodium, and refined carbohydrates    Comments Yvone Neu reports motivation to work toward weight loss. He reports eating three meals per day; he does enjoy a wide variety of foods.  He lives at home with with his wife who does the majority of the grocery shopping and cooking. She is supportive of lifestyle changes.      Intervention Plan   Intervention Prescribe, educate and counsel regarding individualized specific dietary modifications aiming towards targeted core components such as weight, hypertension, lipid management, diabetes, heart failure and other comorbidities.;Nutrition handout(s) given to patient.    Expected Outcomes Short Term Goal: Understand basic principles of dietary content, such as calories, fat, sodium, cholesterol and nutrients.;Long Term Goal: Adherence to prescribed nutrition plan.             Nutrition Assessments:  MEDIFICTS Score Key: ?70 Need to make dietary changes  40-70 Heart Healthy Diet ? 40 Therapeutic Level Cholesterol Diet   Picture Your Plate Scores: <44 Unhealthy dietary pattern with much room for improvement. 41-50 Dietary pattern unlikely to meet recommendations for good health and room for improvement. 51-60 More healthful dietary pattern, with some room for improvement.  >60 Healthy dietary pattern, although there may be some specific behaviors that could be improved.    Nutrition Goals Re-Evaluation:  Nutrition Goals Re-Evaluation     [place] Name 10/10/21 1555             Goals   Current Weight 210 lb 1.6 oz (95.3 kg)       Comment A1c WNL, Lipid panel WNL except HDL 37       Expected Outcome Yvone Neu reports motivation to work toward weight loss. He reports eating three meals per day; he does enjoy a wide variety of foods. He lives at home with with his wife who does the majority of the grocery shopping and cooking. She is supportive of lifestyle changes.                Nutrition Goals Discharge (Final Nutrition Goals Re-Evaluation):  Nutrition Goals Re-Evaluation - 10/10/21 1555       Goals   Current Weight 210 lb 1.6 oz (95.3 kg)    Comment A1c WNL, Lipid panel WNL except HDL 37    Expected Outcome Yvone Neu  reports motivation to work toward weight loss. He reports eating three meals per day; he does enjoy a wide variety of foods. He lives at home with with his wife who does the majority of the grocery shopping and cooking. She is supportive of lifestyle changes.             Psychosocial: Target Goals: Acknowledge presence or absence of significant depression and/or stress, maximize coping skills, provide positive support system. Participant is able to verbalize types and ability to use techniques and skills needed for reducing stress and depression.  Initial Review & Psychosocial Screening:  Initial Psych Review & Screening - 10/02/21 1102       Initial Review   Current issues with None Identified      Family Dynamics   Good Support System? Yes    Comments wife Lynelle Smoke, adoptive children Ruby Cola and his wife      Screening Interventions   Interventions Encouraged to exercise    Expected Outcomes Long Term goal: The participant improves  quality of Life and PHQ9 Scores as seen by post scores and/or verbalization of changes;Short Term goal: Identification and review with participant of any Quality of Life or Depression concerns found by scoring the questionnaire.;Long Term Goal: Stressors or current issues are controlled or eliminated.;Short Term goal: Utilizing psychosocial counselor, staff and physician to assist with identification of specific Stressors or current issues interfering with healing process. Setting desired goal for each stressor or current issue identified.             Quality of Life Scores:  Scores of 19 and below usually indicate a poorer quality of life in these areas.  A difference of  2-3 points is a clinically meaningful difference.  A difference of 2-3 points in the total score of the Quality of Life Index has been associated with significant improvement in overall quality of life, self-image, physical symptoms, and general health in studies assessing change in  quality of life.  PHQ-9: Review Flowsheet       10/02/2021  Depression screen PHQ 2/9  Decreased Interest 0 0  Down, Depressed, Hopeless 0 0  PHQ - 2 Score 0 0  Altered sleeping 0  Tired, decreased energy 0  Change in appetite 0  Feeling bad or failure about yourself  0  Trouble concentrating 0  Moving slowly or fidgety/restless 0  Suicidal thoughts 0  PHQ-9 Score 0  Difficult doing work/chores Not difficult at all   Interpretation of Total Score  Total Score Depression Severity:  1-4 = Minimal depression, 5-9 = Mild depression, 10-14 = Moderate depression, 15-19 = Moderately severe depression, 20-27 = Severe depression   Psychosocial Evaluation and Intervention:  Psychosocial Evaluation - 10/02/21 1103       Psychosocial Evaluation & Interventions   Interventions Encouraged to exercise with the program and follow exercise prescription    Comments No concerns identified    Expected Outcomes For pt to participate in PR.    Continue Psychosocial Services  No Follow up required             Psychosocial Re-Evaluation:  Psychosocial Re-Evaluation     [place] Name 10/11/21 1122             Psychosocial Re-Evaluation   Current issues with None Identified       Comments Roel has just started in the PR program and ahs attended one session. We will continue to monitor for any psychosocial needs.       Expected Outcomes For him to attend the program without any psychosocial issues or concerns.       Interventions Encouraged to attend Pulmonary Rehabilitation for the exercise       Continue Psychosocial Services  No Follow up required                Psychosocial Discharge (Final Psychosocial Re-Evaluation):  Psychosocial Re-Evaluation - 10/11/21 1122       Psychosocial Re-Evaluation   Current issues with None Identified    Comments Dequon has just started in the PR program and ahs attended one session. We will continue to monitor for any psychosocial needs.     Expected Outcomes For him to attend the program without any psychosocial issues or concerns.    Interventions Encouraged to attend Pulmonary Rehabilitation for the exercise    Continue Psychosocial Services  No Follow up required             Education: Education Goals: Education classes will be provided on a weekly basis, covering  required topics. Participant will state understanding/return demonstration of topics presented.  Learning Barriers/Preferences:  Learning Barriers/Preferences - 10/02/21 1105       Learning Barriers/Preferences   Learning Barriers Sight    Learning Preferences Individual Instruction;Group Instruction             Education Topics: Introduction to Pulmonary Rehab Group instruction provided by PowerPoint, verbal discussion, and written material to support subject matter. Instructor reviews what Pulmonary Rehab is, the purpose of the program, and how patients are referred.     Know Your Numbers Group instruction that is supported by a PowerPoint presentation. Instructor discusses importance of knowing and understanding resting, exercise, and post-exercise oxygen saturation, heart rate, and blood pressure. Oxygen saturation, heart rate, blood pressure, rating of perceived exertion, and dyspnea are reviewed along with a normal range for these values.    Exercise for the Pulmonary Patient Group instruction that is supported by a PowerPoint presentation. Instructor discusses benefits of exercise, core components of exercise, frequency, duration, and intensity of an exercise routine, importance of utilizing pulse oximetry during exercise, safety while exercising, and options of places to exercise outside of rehab.       MET Level  Group instruction provided by PowerPoint, verbal discussion, and written material to support subject matter. Instructor reviews what METs are and how to increase METs.    Pulmonary Medications Verbally interactive group  education provided by instructor with focus on inhaled medications and proper administration.   Anatomy and Physiology of the Respiratory System Group instruction provided by PowerPoint, verbal discussion, and written material to support subject matter. Instructor reviews respiratory cycle and anatomical components of the respiratory system and their functions. Instructor also reviews differences in obstructive and restrictive respiratory diseases with examples of each.    Oxygen Safety Group instruction provided by PowerPoint, verbal discussion, and written material to support subject matter. There is an overview of "What is Oxygen" and "Why do we need it".  Instructor also reviews how to create a safe environment for oxygen use, the importance of using oxygen as prescribed, and the risks of noncompliance. There is a brief discussion on traveling with oxygen and resources the patient may utilize.   Oxygen Use Group instruction provided by PowerPoint, verbal discussion, and written material to discuss how supplemental oxygen is prescribed and different types of oxygen supply systems. Resources for more information are provided.    Breathing Techniques Group instruction that is supported by demonstration and informational handouts. Instructor discusses the benefits of pursed lip and diaphragmatic breathing and detailed demonstration on how to perform both.     Risk Factor Reduction Group instruction that is supported by a PowerPoint presentation. Instructor discusses the definition of a risk factor, different risk factors for pulmonary disease, and how the heart and lungs work together.   MD Day A group question and answer session with a medical doctor that allows participants to ask questions that relate to their pulmonary disease state.   Nutrition for the Pulmonary Patient Group instruction provided by PowerPoint slides, verbal discussion, and written materials to support subject  matter. The instructor gives an explanation and review of healthy diet recommendations, which includes a discussion on weight management, recommendations for fruit and vegetable consumption, as well as protein, fluid, caffeine, fiber, sodium, sugar, and alcohol. Tips for eating when patients are short of breath are discussed.    Other Education Group or individual verbal, written, or video instructions that support the educational goals of the pulmonary rehab program.  Knowledge Questionnaire Score:  Knowledge Questionnaire Score - 10/02/21 1106       Knowledge Questionnaire Score   Pre Score 13/18             Core Components/Risk Factors/Patient Goals at Admission:  Personal Goals and Risk Factors at Admission - 10/02/21 1110       Core Components/Risk Factors/Patient Goals on Admission    Weight Management Weight Loss    Improve shortness of breath with ADL's Yes    Intervention Provide education, individualized exercise plan and daily activity instruction to help decrease symptoms of SOB with activities of daily living.    Expected Outcomes Short Term: Improve cardiorespiratory fitness to achieve a reduction of symptoms when performing ADLs;Long Term: Be able to perform more ADLs without symptoms or delay the onset of symptoms             Core Components/Risk Factors/Patient Goals Review:   Goals and Risk Factor Review     Row Name 10/11/21 1125             Core Components/Risk Factors/Patient Goals Review   Personal Goals Review Weight Management/Obesity;Improve shortness of breath with ADL's;Develop more efficient breathing techniques such as purse lipped breathing and diaphragmatic breathing and practicing self-pacing with activity.;Increase knowledge of respiratory medications and ability to use respiratory devices properly.       Review Allison has just started the program and attended one session. We will continue to monitor his weight and other core  components as he progress through the program.       Expected Outcomes See admission goals.                Core Components/Risk Factors/Patient Goals at Discharge (Final Review):   Goals and Risk Factor Review - 10/11/21 1125       Core Components/Risk Factors/Patient Goals Review   Personal Goals Review Weight Management/Obesity;Improve shortness of breath with ADL's;Develop more efficient breathing techniques such as purse lipped breathing and diaphragmatic breathing and practicing self-pacing with activity.;Increase knowledge of respiratory medications and ability to use respiratory devices properly.    Review Merwin has just started the program and attended one session. We will continue to monitor his weight and other core components as he progress through the program.    Expected Outcomes See admission goals.             ITP Comments: Pt is making expected progress toward Pulmonary Rehab goals after completing 1 sessions. Recommend continued exercise, life style modification, education, and utilization of breathing techniques to increase stamina and strength, while also decreasing shortness of breath with exertion.  Dr. Rodman Pickle is Medical Director for Pulmonary Rehab at [facility].

## 2021-10-12 ENCOUNTER — Encounter (HOSPITAL_COMMUNITY)
Admission: RE | Admit: 2021-10-12 | Discharge: 2021-10-12 | Disposition: A | Discharge status: Home / Self Care | Payer: Medicare Other | Source: Ambulatory Visit | Attending: Internal Medicine | Admitting: Internal Medicine

## 2021-10-12 VITALS — Wt 211.0 lb

## 2021-10-12 DIAGNOSIS — J849 Interstitial pulmonary disease, unspecified: Secondary | ICD-10-CM

## 2021-10-12 NOTE — Progress Notes (Signed)
Daily Session Note  Patient Details  Name: Woodruff Skirvin MRN: [id number] Date of Birth: [birth year] Referring Provider:   April Manson Pulmonary Rehab Walk Test from 10/02/2021 in [place]  Referring Provider Ramaswamy       Encounter Date: 10/12/2021  Check In:  Session Check In - 10/12/21 1439       Check-In   Supervising physician immediately available to respond to emergencies [facility] - Physician supervision    Physician(s) Dr. Ernest Mallick    Location [facility]    Staff Present Elmon Else, MS, ACSM-CEP, Exercise Physiologist;Randi Yevonne Pax, ACSM-CEP, Exercise Physiologist;Carlette Wilber Oliphant, RN, Roque Cash, RN    Virtual Visit No    Medication changes reported     No    Fall or balance concerns reported    No    Tobacco Cessation No Change    Warm-up and Cool-down Performed as group-led instruction    Resistance Training Performed Yes    VAD Patient? No    PAD/SET Patient? No      Pain Assessment   Currently in Pain? No/denies    Multiple Pain Sites No             Capillary Blood Glucose: No results found for this or any previous visit (from the past 24 hour(s)).    Social History   Tobacco Use  Smoking Status Never   Passive exposure: Past  Smokeless Tobacco Never    Goals Met:  Proper associated with RPD/PD & O2 Sat Exercise tolerated well No report of concerns or symptoms today Strength training completed today  Goals Unmet:  Not Applicable  Comments: Service time is from 1333 to 1450.    Dr. Rodman Pickle is Medical Director for Pulmonary Rehab at [facility].

## 2021-10-13 NOTE — Telephone Encounter (Signed)
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

## 2021-10-13 NOTE — Telephone Encounter (Signed)
Received a call from [place] with [place] about a CMN that was sent over to the office. Spoke with Luanna Cole who stated that the CMN was sent over via [employer] to the office to Dr. Chase Caller.  Vallarie Mare, please advise if this has been received.

## 2021-10-13 NOTE — Telephone Encounter (Signed)
I just did all CMNs in Go [employer] nothing for this patient

## 2021-10-17 ENCOUNTER — Encounter (HOSPITAL_COMMUNITY): Payer: Medicare Other

## 2021-10-18 ENCOUNTER — Telehealth (HOSPITAL_COMMUNITY): Payer: Self-pay | Admitting: Family Medicine

## 2021-10-19 ENCOUNTER — Encounter (HOSPITAL_COMMUNITY): Payer: Medicare Other

## 2021-10-24 ENCOUNTER — Encounter (HOSPITAL_COMMUNITY)
Admission: RE | Admit: 2021-10-24 | Discharge: 2021-10-24 | Disposition: A | Discharge status: Home / Self Care | Payer: Medicare Other | Source: Ambulatory Visit | Attending: Internal Medicine | Admitting: Internal Medicine

## 2021-10-24 DIAGNOSIS — Z5189 Encounter for other specified aftercare: Secondary | ICD-10-CM | POA: Insufficient documentation

## 2021-10-24 DIAGNOSIS — J849 Interstitial pulmonary disease, unspecified: Secondary | ICD-10-CM | POA: Insufficient documentation

## 2021-10-24 NOTE — Progress Notes (Signed)
Daily Session Note  Patient Details  Name: Dustin Dean MRN: [id number] Date of Birth: 1950/06/28 Referring Provider:   April Manson Pulmonary Rehab Walk Test from 10/02/2021 in [place]  Referring Provider Ramaswamy       Encounter Date: 10/24/2021  Check In:  Session Check In - 10/24/21 1454       Check-In   Supervising physician immediately available to respond to emergencies [facility] - Physician supervision    Physician(s) Dr. Erskine Emery    Location [facility]    Staff Present Rodney Langton, Cathleen Fears, MS, ACSM-CEP, Exercise Physiologist;David Lilyan Punt, MS, ACSM-CEP, CCRP, Exercise Physiologist;Randi Olen Cordial BS, ACSM-CEP, Exercise Physiologist    Virtual Visit No    Medication changes reported     No    Fall or balance concerns reported    No    Tobacco Cessation No Change    Warm-up and Cool-down Performed as group-led instruction    Resistance Training Performed Yes    VAD Patient? No    PAD/SET Patient? No      Pain Assessment   Currently in Pain? No/denies    Multiple Pain Sites No             Capillary Blood Glucose: No results found for this or any previous visit (from the past 24 hour(s)).    Social History   Tobacco Use  Smoking Status Never   Passive exposure: Past  Smokeless Tobacco Never    Goals Met:  Proper associated with RPD/PD & O2 Sat Independence with exercise equipment Exercise tolerated well No report of concerns or symptoms today Strength training completed today  Goals Unmet:  Not Applicable  Comments: Service time is from 1320 to 1441.    Dr. Rodman Pickle is Medical Director for Pulmonary Rehab at [facility].

## 2021-10-26 ENCOUNTER — Encounter (HOSPITAL_COMMUNITY)
Admission: RE | Admit: 2021-10-26 | Discharge: 2021-10-26 | Disposition: A | Discharge status: Home / Self Care | Payer: Medicare Other | Source: Ambulatory Visit | Attending: Internal Medicine | Admitting: Internal Medicine

## 2021-10-26 DIAGNOSIS — J849 Interstitial pulmonary disease, unspecified: Secondary | ICD-10-CM

## 2021-10-26 NOTE — Progress Notes (Signed)
Home Exercise Prescription I have reviewed a Home Exercise Prescription with [name]. Discussed home exercise with pt. He is currently not exercising but making plans to walk on his treadmill 2-3 nonrehab days a week, 30 min.The patient stated that their goals were to stay healthy and lose 20 lbs. We reviewed exercise guidelines, target heart rate during exercise, RPE Scale, weather conditions, endpoints for exercise, warmup and cool down. The patient is encouraged to come to me with any questions. I will continue to follow up with the patient to assist them with progression and safety.    Camrin Gearheart [place], [place], ACSM-CEP 10/26/2021 4:10 PM

## 2021-10-26 NOTE — Progress Notes (Signed)
Daily Session Note  Patient Details  Name: Dustin Dean MRN: [id number] Date of Birth: May 22, 1950 Referring Provider:   April Manson Pulmonary Rehab Walk Test from 10/02/2021 in [place]  Referring Provider Ramaswamy       Encounter Date: 10/26/2021  Check In:  Session Check In - 10/26/21 1432       Check-In   Supervising physician immediately available to respond to emergencies [facility] - Physician supervision    Physician(s) Erskine Emery    [facility]    Staff Present Rodney Langton, Cathleen Fears, MS, ACSM-CEP, Exercise Physiologist;Randi Yevonne Pax, ACSM-CEP, Exercise Physiologist;Jetta Walker BS, ACSM-CEP, Exercise Physiologist    Virtual Visit No    Medication changes reported     No    Fall or balance concerns reported    No    Tobacco Cessation No Change    Warm-up and Cool-down Performed as group-led instruction    Resistance Training Performed Yes    VAD Patient? No    PAD/SET Patient? No      Pain Assessment   Currently in Pain? No/denies    Multiple Pain Sites No             Capillary Blood Glucose: No results found for this or any previous visit (from the past 24 hour(s)).    Social History   Tobacco Use  Smoking Status Never   Passive exposure: Past  Smokeless Tobacco Never    Goals Met:  Proper associated with RPD/PD & O2 Sat Exercise tolerated well No report of concerns or symptoms today Strength training completed today  Goals Unmet:  Not Applicable  Comments: Service time is from 1321 to 1449.    Dr. Rodman Pickle is Medical Director for Pulmonary Rehab at [facility].

## 2021-10-27 NOTE — Telephone Encounter (Signed)
Dustin Dean can you check on this one

## 2021-10-27 NOTE — Telephone Encounter (Signed)
Nicole Kindred from [place] with [place] called about a CMN that was sent over to the office. Stated that the CMN was sent over via [employer] to the office to Dr. Chase Caller and would like an update. Call back number is [phone] and fax is [phone].

## 2021-10-27 NOTE — Telephone Encounter (Signed)
I only had 2 patients in [employer] this morning and neither were this patient. I just tried pulling up the patients name in [place] and nothing for him

## 2021-10-27 NOTE — Telephone Encounter (Signed)
Called [facility] letting them know that we had not received the CMN. When verified fax number, they still had our old office fax number so provided them with our correct office fax number so they could take care of resending the CMN.

## 2021-10-31 ENCOUNTER — Encounter (HOSPITAL_COMMUNITY)
Admission: RE | Admit: 2021-10-31 | Discharge: 2021-10-31 | Disposition: A | Discharge status: Home / Self Care | Payer: Medicare Other | Source: Ambulatory Visit | Attending: Internal Medicine | Admitting: Internal Medicine

## 2021-10-31 VITALS — Wt 209.7 lb

## 2021-10-31 DIAGNOSIS — J849 Interstitial pulmonary disease, unspecified: Secondary | ICD-10-CM

## 2021-10-31 NOTE — Progress Notes (Signed)
Daily Session Note  Patient Details  Name: Dustin Dean MRN: [id number] Date of Birth: 30-Jan-1950 Referring Provider:   April Manson Pulmonary Rehab Walk Test from 10/02/2021 in [place]  Referring Provider Ramaswamy       Encounter Date: 10/31/2021  Check In:  Session Check In - 10/31/21 1429       Check-In   Supervising physician immediately available to respond to emergencies [facility] - Physician supervision    Physician(s) Tacy Learn    [facility]    Staff Present Rodney Langton, Cathleen Fears, MS, ACSM-CEP, Exercise Physiologist;Randi Olen Cordial BS, ACSM-CEP, Exercise Physiologist;David Lilyan Punt, MS, ACSM-CEP, CCRP, Exercise Physiologist;Samantha [place], RD, LDN    Virtual Visit No    Medication changes reported     No    Fall or balance concerns reported    No    Tobacco Cessation No Change    Warm-up and Cool-down Performed as group-led instruction    Resistance Training Performed Yes    VAD Patient? No    PAD/SET Patient? No      Pain Assessment   Currently in Pain? No/denies    Multiple Pain Sites No             Capillary Blood Glucose: No results found for this or any previous visit (from the past 24 hour(s)).   Exercise Prescription Changes - 10/31/21 1500       Response to Exercise   Blood Pressure (Admit) 122/70    Blood Pressure (Exercise) 120/62    Blood Pressure (Exit) 120/84    Heart Rate (Admit) 100 bpm    Heart Rate (Exercise) 125 bpm    Heart Rate (Exit) 110 bpm    Oxygen Saturation (Admit) 97 %    Oxygen Saturation (Exercise) 92 %    Oxygen Saturation (Exit) 94 %    Rating of Perceived Exertion (Exercise) 12    Perceived Dyspnea (Exercise) 2    Duration Continue with 30 min of aerobic exercise without signs/symptoms of physical distress.    Intensity THRR unchanged      Progression   Progression Continue to progress workloads to maintain intensity without signs/symptoms of physical  distress.      Resistance Training   Training Prescription Yes    Weight blue bands    Reps 10-15    Time 10 Minutes      Oxygen   Oxygen Continuous    Liters 2      NuStep   Level 3    SPM 70    Minutes 15    METs 1.7      Track   Laps 13    Minutes 15    METs 2.51      Oxygen   Maintain Oxygen Saturation 88% or higher             Social History   Tobacco Use  Smoking Status Never   Passive exposure: Past  Smokeless Tobacco Never    Goals Met:  Proper associated with RPD/PD & O2 Sat Independence with exercise equipment Exercise tolerated well No report of concerns or symptoms today Strength training completed today  Goals Unmet:  Not Applicable  Comments: Service time is from 1325 to 1445.    Dr. Rodman Pickle is Medical Director for Pulmonary Rehab at [facility].

## 2021-11-02 ENCOUNTER — Encounter (HOSPITAL_COMMUNITY)
Admission: RE | Admit: 2021-11-02 | Discharge: 2021-11-02 | Disposition: A | Discharge status: Home / Self Care | Payer: Medicare Other | Source: Ambulatory Visit | Attending: Internal Medicine | Admitting: Internal Medicine

## 2021-11-02 DIAGNOSIS — J849 Interstitial pulmonary disease, unspecified: Secondary | ICD-10-CM | POA: Diagnosis not present

## 2021-11-02 NOTE — Progress Notes (Signed)
Daily Session Note  Patient Details  Name: Dustin Dean MRN: [id number] Date of Birth: [birth year] Referring Provider:   April Manson Pulmonary Rehab Walk Test from 10/02/2021 in [place]  Referring Provider Ramaswamy       Encounter Date: 11/02/2021  Check In:  Session Check In - 11/02/21 1433       Check-In   Supervising physician immediately available to respond to emergencies [facility] - Physician supervision    Physician(s) Tacy Learn    Location [facility]    Staff Present Rodney Langton, Cathleen Fears, MS, ACSM-CEP, Exercise Physiologist;Burr Soffer Olen Cordial BS, ACSM-CEP, Exercise Physiologist;David Lilyan Punt, MS, ACSM-CEP, CCRP, Exercise Physiologist;Samantha [place], RD, LDN    Virtual Visit No    Medication changes reported     No    Fall or balance concerns reported    No    Tobacco Cessation No Change    Warm-up and Cool-down Performed as group-led instruction    Resistance Training Performed Yes    VAD Patient? No    PAD/SET Patient? No      Pain Assessment   Currently in Pain? No/denies    Multiple Pain Sites No             Capillary Blood Glucose: No results found for this or any previous visit (from the past 24 hour(s)).    Social History   Tobacco Use  Smoking Status Never   Passive exposure: Past  Smokeless Tobacco Never    Goals Met:  Independence with exercise equipment Exercise tolerated well No report of concerns or symptoms today Strength training completed today  Goals Unmet:  Not Applicable  Comments: Service time is from 1322 to 1456.    Dr. Rodman Pickle is Medical Director for Pulmonary Rehab at [facility].

## 2021-11-07 ENCOUNTER — Encounter (HOSPITAL_COMMUNITY)
Admission: RE | Admit: 2021-11-07 | Discharge: 2021-11-07 | Disposition: A | Discharge status: Home / Self Care | Payer: Medicare Other | Source: Ambulatory Visit | Attending: Internal Medicine | Admitting: Internal Medicine

## 2021-11-07 DIAGNOSIS — J849 Interstitial pulmonary disease, unspecified: Secondary | ICD-10-CM

## 2021-11-07 NOTE — Progress Notes (Signed)
Daily Session Note  Patient Details  Name: Dustin Dean MRN: [id number] Date of Birth: 1950-06-13 Referring Provider:   April Manson Pulmonary Rehab Walk Test from 10/02/2021 in [place]  Referring Provider Ramaswamy       Encounter Date: 11/07/2021  Check In:  Session Check In - 11/07/21 1435       Check-In   Supervising physician immediately available to respond to emergencies [facility] - Physician supervision    Physician(s) Tacy Learn    Location [facility]    Staff Present Rodney Langton, Cathleen Fears, MS, ACSM-CEP, Exercise Physiologist;Yamato Kopf Yevonne Pax, ACSM-CEP, Exercise Physiologist;Samantha [place], RD, LDN;Olinty Celesta Aver, MS, ACSM-CEP, Exercise Physiologist    Virtual Visit No    Medication changes reported     No    Fall or balance concerns reported    No    Tobacco Cessation No Change    Warm-up and Cool-down Performed as group-led instruction    Resistance Training Performed Yes    VAD Patient? No    PAD/SET Patient? No      Pain Assessment   Currently in Pain? No/denies    Multiple Pain Sites No             Capillary Blood Glucose: No results found for this or any previous visit (from the past 24 hour(s)).    Social History   Tobacco Use  Smoking Status Never   Passive exposure: Past  Smokeless Tobacco Never    Goals Met:  Independence with exercise equipment Exercise tolerated well No report of concerns or symptoms today Strength training completed today  Goals Unmet:  Not Applicable  Comments: Service time is from 1322 to 1455.    Dr. Rodman Pickle is Medical Director for Pulmonary Rehab at [facility].

## 2021-11-08 NOTE — Progress Notes (Signed)
Pulmonary Individual Treatment Plan  Patient Details  Name: Dustin Dean MRN: [id number] Date of Birth: 08-05-1950 Referring Provider:   April Manson Pulmonary Rehab Walk Test from 10/02/2021 in [place]  Referring Provider [place]       Initial Encounter Date:  Flowsheet Row Pulmonary Rehab Walk Test from 10/02/2021 in [place]  Date 10/02/21       Visit Diagnosis: ILD (interstitial lung disease) ([place])  Patient's Home Medications on Admission:   Current Outpatient Medications:    acetaminophen (TYLENOL) 325 MG tablet, Take by mouth as needed., Disp: , Rfl:    albuterol (VENTOLIN HFA) 108 (90 Base) MCG/ACT inhaler, Inhale into the lungs. (Patient not taking: Reported on 09/08/2021), Disp: , Rfl:    aspirin-acetaminophen-caffeine (EXCEDRIN MIGRAINE) 250-250-65 MG tablet, Take by mouth as needed., Disp: , Rfl:    Calcium Carb-Cholecalciferol (CALCIUM/VITAMIN D PO), Take 1 tablet by mouth daily., Disp: , Rfl:    Cetirizine HCl (ZYRTEC PO), Take by mouth., Disp: , Rfl:    fluorouracil (EFUDEX) 5 % cream, Apply topically., Disp: , Rfl:    fluticasone (FLONASE) 50 MCG/ACT nasal spray, Place into both nostrils daily., Disp: , Rfl:    gabapentin (NEURONTIN) 300 MG capsule, Take 300 mg by mouth 3 (three) times daily., Disp: , Rfl:    loperamide (IMODIUM) 2 MG capsule, Take 1 mg by mouth once., Disp: , Rfl:    meclizine (ANTIVERT) 25 MG tablet, Take 25 mg by mouth 2 (two) times daily as needed., Disp: , Rfl:    Multiple Vitamin (MULTIVITAMIN) capsule, Take 1 capsule by mouth every morning., Disp: , Rfl:    naproxen sodium (ALEVE) 220 MG tablet, Take 220 mg by mouth daily as needed., Disp: , Rfl:    omeprazole (PRILOSEC) 40 MG capsule, Take 40 mg by mouth daily., Disp: , Rfl:    PAROXETINE HCL PO, Take by mouth., Disp: , Rfl:    Pirfenidone (ESBRIET) 267 MG TABS, Take 3 tablets (801 mg total) by mouth 3 (three) times  daily with meals., Disp: 270 tablet, Rfl: 4   rosuvastatin (CRESTOR) 5 MG tablet, Take 1 tablet by mouth daily., Disp: , Rfl:    SUMAtriptan (IMITREX) 100 MG tablet, TAKE ONE TABLET BY MOUTH AT ONSET OF HEADACHE; MAY REPEAT ONE TABLET IN 2 HOURS IF NEEDED. MAX OF 200 MG PER DAY, Disp: , Rfl:   Past Medical History: Past Medical History:  Diagnosis Date   Aortic stenosis    Flu    Pneumonia    Prostate cancer ([place])    Pulmonary fibrosis ([place])    Skin cancer     Tobacco Use: Social History   Tobacco Use  Smoking Status Never   Passive exposure: Past  Smokeless Tobacco Never    Labs: Review Flowsheet        No data to display          Capillary Blood Glucose: No results found for: "GLUCAP"   Pulmonary Assessment Scores:  Pulmonary Assessment Scores     Row Name 10/02/21 1119         ADL UCSD   ADL Phase Entry     SOB Score total 40       CAT Score   CAT Score 16       mMRC Score   mMRC Score 2             UCSD: Self-administered rating of dyspnea associated with activities of daily  living (ADLs) 6-point scale (0 = "not at all" to 5 = "maximal or unable to do because of breathlessness")  Scoring Scores range from 0 to 120.  Minimally important difference is 5 units  CAT: CAT can identify the health impairment of COPD patients and is better correlated with disease progression.  CAT has a scoring range of zero to 40. The CAT score is classified into four groups of low (less than 10), medium (10 - 20), high (21-30) and very high (31-40) based on the impact level of disease on health status. A CAT score over 10 suggests significant symptoms.  A worsening CAT score could be explained by an exacerbation, poor medication adherence, poor inhaler technique, or progression of COPD or comorbid conditions.  CAT MCID is 2 points  mMRC: mMRC (Modified Medical Research Council) Dyspnea Scale is used to assess the degree of baseline functional disability in patients  of respiratory disease due to dyspnea. No minimal important difference is established. A decrease in score of 1 point or greater is considered a positive change.   Pulmonary Function Assessment:  Pulmonary Function Assessment - 10/02/21 1122       Breath   Bilateral Breath Sounds Decreased;Rales;Wheezes    Shortness of Breath No;Limiting activity             Exercise Target Goals: Exercise Program Goal: Individual exercise prescription set using results from initial 6 min walk test and THRR while considering  patient's activity barriers and safety.   Exercise Prescription Goal: Initial exercise prescription builds to 30-45 minutes a day of aerobic activity, 2-3 days per week.  Home exercise guidelines will be given to patient during program as part of exercise prescription that the participant will acknowledge.  Activity Barriers & Risk Stratification:  Activity Barriers & Cardiac Risk Stratification - 10/02/21 1116       Activity Barriers & Cardiac Risk Stratification   Activity Barriers Muscular Weakness;Shortness of Breath;Deconditioning    Cardiac Risk Stratification Moderate             6 Minute Walk:  6 Minute Walk     Row Name 10/02/21 1223         6 Minute Walk   Phase Initial     Distance 1175 feet     Walk Time 6 minutes     # of Rest Breaks 0     MPH 2.23     METS 2.68     RPE 12     Perceived Dyspnea  1     VO2 Peak 9.37     Symptoms No     Resting HR 82 bpm     Resting BP 112/78     Resting Oxygen Saturation  97 %     Exercise Oxygen Saturation  during 6 min walk 92 %     Max Ex. HR 108 bpm     Max Ex. BP 138/76     2 Minute Post BP 130/76       Interval HR   1 Minute HR 106     2 Minute HR 108     3 Minute HR 108     4 Minute HR 108     5 Minute HR 108     6 Minute HR 107     2 Minute Post HR 77     Interval Heart Rate? Yes       Interval Oxygen   Interval Oxygen? Yes     Baseline Oxygen  Saturation % 97 %     1 Minute Oxygen  Saturation % 94 %     1 Minute Liters of Oxygen 2 L     2 Minute Oxygen Saturation % 92 %     2 Minute Liters of Oxygen 2 L     3 Minute Oxygen Saturation % 93 %     3 Minute Liters of Oxygen 2 L     4 Minute Oxygen Saturation % 92 %     4 Minute Liters of Oxygen 2 L     5 Minute Oxygen Saturation % 94 %     5 Minute Liters of Oxygen 2 L     6 Minute Oxygen Saturation % 96 %     6 Minute Liters of Oxygen 2 L     2 Minute Post Oxygen Saturation % 96 %     2 Minute Post Liters of Oxygen 2 L              Oxygen Initial Assessment:  Oxygen Initial Assessment - 10/02/21 1111       Home Oxygen   Home Oxygen Device Home Concentrator    Sleep Oxygen Prescription Continuous    Liters per minute 2.5    Home Exercise Oxygen Prescription Pulsed    Liters per minute 2    Home Resting Oxygen Prescription None    Compliance with Home Oxygen Use Yes      Initial 6 min Walk   Oxygen Used Continuous    Liters per minute 2      Program Oxygen Prescription   Program Oxygen Prescription Continuous    Liters per minute 2      Intervention   Short Term Goals To learn and exhibit compliance with exercise, home and travel O2 prescription;To learn and understand importance of monitoring SPO2 with pulse oximeter and demonstrate accurate use of the pulse oximeter.;To learn and understand importance of maintaining oxygen saturations>88%;To learn and demonstrate proper use of respiratory medications;To learn and demonstrate proper pursed lip breathing techniques or other breathing techniques.     Long  Term Goals Exhibits compliance with exercise, home  and travel O2 prescription;Verbalizes importance of monitoring SPO2 with pulse oximeter and return demonstration;Maintenance of O2 saturations>88%;Exhibits proper breathing techniques, such as pursed lip breathing or other method taught during program session;Compliance with respiratory medication;Demonstrates proper use of MDI's              Oxygen Re-Evaluation:  Oxygen Re-Evaluation     Row Name 10/09/21 1004 11/02/21 1603           Program Oxygen Prescription   Program Oxygen Prescription Continuous Continuous      Liters per minute 2 2        Home Oxygen   Home Oxygen Device Home Concentrator Home Concentrator      Sleep Oxygen Prescription Continuous Continuous      Liters per minute 2.5 2.5      Home Exercise Oxygen Prescription Pulsed Pulsed      Liters per minute 2 2      Home Resting Oxygen Prescription None None      Compliance with Home Oxygen Use Yes Yes        Goals/Expected Outcomes   Short Term Goals To learn and exhibit compliance with exercise, home and travel O2 prescription;To learn and understand importance of monitoring SPO2 with pulse oximeter and demonstrate accurate use of the pulse oximeter.;To learn and understand importance of maintaining oxygen  saturations>88%;To learn and demonstrate proper use of respiratory medications;To learn and demonstrate proper pursed lip breathing techniques or other breathing techniques.  To learn and exhibit compliance with exercise, home and travel O2 prescription;To learn and understand importance of monitoring SPO2 with pulse oximeter and demonstrate accurate use of the pulse oximeter.;To learn and understand importance of maintaining oxygen saturations>88%;To learn and demonstrate proper use of respiratory medications;To learn and demonstrate proper pursed lip breathing techniques or other breathing techniques.       Long  Term Goals Exhibits compliance with exercise, home  and travel O2 prescription;Verbalizes importance of monitoring SPO2 with pulse oximeter and return demonstration;Maintenance of O2 saturations>88%;Exhibits proper breathing techniques, such as pursed lip breathing or other method taught during program session;Compliance with respiratory medication;Demonstrates proper use of MDI's Exhibits compliance with exercise, home  and travel O2  prescription;Verbalizes importance of monitoring SPO2 with pulse oximeter and return demonstration;Maintenance of O2 saturations>88%;Exhibits proper breathing techniques, such as pursed lip breathing or other method taught during program session;Compliance with respiratory medication;Demonstrates proper use of MDI's      Comments -- no change in oxygen needs      Goals/Expected Outcomes Compliance and understanding of oxygen saturation monitoring and breathing techniques to decrease shortness of breath. Compliance and understanding of oxygen saturation monitoring and breathing techniques to decrease shortness of breath.               Oxygen Discharge (Final Oxygen Re-Evaluation):  Oxygen Re-Evaluation - 11/02/21 1603       Program Oxygen Prescription   Program Oxygen Prescription Continuous    Liters per minute 2      Home Oxygen   Home Oxygen Device Home Concentrator    Sleep Oxygen Prescription Continuous    Liters per minute 2.5    Home Exercise Oxygen Prescription Pulsed    Liters per minute 2    Home Resting Oxygen Prescription None    Compliance with Home Oxygen Use Yes      Goals/Expected Outcomes   Short Term Goals To learn and exhibit compliance with exercise, home and travel O2 prescription;To learn and understand importance of monitoring SPO2 with pulse oximeter and demonstrate accurate use of the pulse oximeter.;To learn and understand importance of maintaining oxygen saturations>88%;To learn and demonstrate proper use of respiratory medications;To learn and demonstrate proper pursed lip breathing techniques or other breathing techniques.     Long  Term Goals Exhibits compliance with exercise, home  and travel O2 prescription;Verbalizes importance of monitoring SPO2 with pulse oximeter and return demonstration;Maintenance of O2 saturations>88%;Exhibits proper breathing techniques, such as pursed lip breathing or other method taught during program session;Compliance with  respiratory medication;Demonstrates proper use of MDI's    Comments no change in oxygen needs    Goals/Expected Outcomes Compliance and understanding of oxygen saturation monitoring and breathing techniques to decrease shortness of breath.             Initial Exercise Prescription:  Initial Exercise Prescription - 10/02/21 1200       Date of Initial Exercise RX and Referring Provider   Date 10/02/21    Referring Provider Ramaswamy    Expected Discharge Date 12/05/21      Oxygen   Oxygen Continuous    Liters 2    Maintain Oxygen Saturation 88% or higher      Recumbant Bike   Level 2    RPM 60    Minutes 15      NuStep   Level 2  SPM 60    Minutes 15      Prescription Details   Frequency (times per week) 2    Duration Progress to 30 minutes of continuous aerobic without signs/symptoms of physical distress      Intensity   THRR 40-80% of Max Heartrate 60-121    Ratings of Perceived Exertion 11-13    Perceived Dyspnea 0-4      Progression   Progression Continue to progress workloads to maintain intensity without signs/symptoms of physical distress.      Resistance Training   Training Prescription Yes    Weight blue bands    Reps 10-15             Perform Capillary Blood Glucose checks as needed.  Exercise Prescription Changes:   Exercise Prescription Changes     Row Name 10/17/21 1500 10/26/21 1600 10/31/21 1500         Response to Exercise   Blood Pressure (Admit) 128/70 -- 122/70     Blood Pressure (Exercise) -- -- 120/62     Blood Pressure (Exit) 112/68 -- 120/84     Heart Rate (Admit) 95 bpm -- 100 bpm     Heart Rate (Exercise) 120 bpm -- 125 bpm     Heart Rate (Exit) 104 bpm -- 110 bpm     Oxygen Saturation (Admit) 98 % -- 97 %     Oxygen Saturation (Exercise) 94 % -- 92 %     Oxygen Saturation (Exit) 96 % -- 94 %     Rating of Perceived Exertion (Exercise) 11 -- 12     Perceived Dyspnea (Exercise) 1 -- 2     Duration Continue with 30  min of aerobic exercise without signs/symptoms of physical distress. -- Continue with 30 min of aerobic exercise without signs/symptoms of physical distress.     Intensity THRR unchanged -- THRR unchanged       Progression   Progression Continue to progress workloads to maintain intensity without signs/symptoms of physical distress. -- Continue to progress workloads to maintain intensity without signs/symptoms of physical distress.       Resistance Training   Training Prescription Yes -- Yes     Weight blue bands -- blue bands     Reps 10-15 -- 10-15     Time 10 Minutes -- 10 Minutes       Oxygen   Oxygen Continuous -- Continuous     Liters 2 -- 2       NuStep   Level 2 -- 3     SPM 60 -- 70     Minutes 15 -- 15     METs 2.9 -- 1.7       Track   Laps 14 -- 13     Minutes 15 -- 15     METs 2.62 -- 2.51       Home Exercise Plan   Plans to continue exercise at -- Home (comment)  treadmill --     Frequency -- Add 2 additional days to program exercise sessions. --     Initial Home Exercises Provided -- 10/26/21 --       Oxygen   Maintain Oxygen Saturation 88% or higher -- 88% or higher              Exercise Comments:   Exercise Comments     Row Name 10/10/21 1538 10/26/21 1608         Exercise Comments Pt completed his first day of  exercise. He exercised on nustep for 15 min, level 2, 1.6 METs. He then attempted scifit bike however could not get comfortable on seat. He instead walked the track, METs 1.86. tolerated well. Discussed METs with understanding. Performed warm up and cool down standing, tolerated well. Discussed home exercise with pt. He is currently not exercising but making plans to walk on his treadmill 2-3 nonrehab days a week, 30 min.               Exercise Goals and Review:   Exercise Goals     Row Name 10/02/21 1238 10/09/21 1003 11/02/21 1555         Exercise Goals   Increase Physical Activity Yes Yes Yes     Intervention Provide advice,  education, support and counseling about physical activity/exercise needs.;Develop an individualized exercise prescription for aerobic and resistive training based on initial evaluation findings, risk stratification, comorbidities and participant's personal goals. Provide advice, education, support and counseling about physical activity/exercise needs.;Develop an individualized exercise prescription for aerobic and resistive training based on initial evaluation findings, risk stratification, comorbidities and participant's personal goals. Provide advice, education, support and counseling about physical activity/exercise needs.;Develop an individualized exercise prescription for aerobic and resistive training based on initial evaluation findings, risk stratification, comorbidities and participant's personal goals.     Expected Outcomes Short Term: Attend rehab on a regular basis to increase amount of physical activity.;Long Term: Add in home exercise to make exercise part of routine and to increase amount of physical activity.;Long Term: Exercising regularly at least 3-5 days a week. Short Term: Attend rehab on a regular basis to increase amount of physical activity.;Long Term: Add in home exercise to make exercise part of routine and to increase amount of physical activity.;Long Term: Exercising regularly at least 3-5 days a week. Short Term: Attend rehab on a regular basis to increase amount of physical activity.;Long Term: Add in home exercise to make exercise part of routine and to increase amount of physical activity.;Long Term: Exercising regularly at least 3-5 days a week.     Increase Strength and Stamina Yes Yes Yes     Intervention Provide advice, education, support and counseling about physical activity/exercise needs.;Develop an individualized exercise prescription for aerobic and resistive training based on initial evaluation findings, risk stratification, comorbidities and participant's personal  goals. Provide advice, education, support and counseling about physical activity/exercise needs.;Develop an individualized exercise prescription for aerobic and resistive training based on initial evaluation findings, risk stratification, comorbidities and participant's personal goals. Provide advice, education, support and counseling about physical activity/exercise needs.;Develop an individualized exercise prescription for aerobic and resistive training based on initial evaluation findings, risk stratification, comorbidities and participant's personal goals.     Expected Outcomes Short Term: Increase workloads from initial exercise prescription for resistance, speed, and METs.;Short Term: Perform resistance training exercises routinely during rehab and add in resistance training at home;Long Term: Improve cardiorespiratory fitness, muscular endurance and strength as measured by increased METs and functional capacity (6MWT) Short Term: Increase workloads from initial exercise prescription for resistance, speed, and METs.;Short Term: Perform resistance training exercises routinely during rehab and add in resistance training at home;Long Term: Improve cardiorespiratory fitness, muscular endurance and strength as measured by increased METs and functional capacity (6MWT) Short Term: Increase workloads from initial exercise prescription for resistance, speed, and METs.;Short Term: Perform resistance training exercises routinely during rehab and add in resistance training at home;Long Term: Improve cardiorespiratory fitness, muscular endurance and strength as measured by increased METs and functional  capacity (6MWT)     Able to understand and use rate of perceived exertion (RPE) scale Yes Yes Yes     Intervention Provide education and explanation on how to use RPE scale Provide education and explanation on how to use RPE scale Provide education and explanation on how to use RPE scale     Expected Outcomes Short Term:  Able to use RPE daily in rehab to express subjective intensity level;Long Term:  Able to use RPE to guide intensity level when exercising independently Short Term: Able to use RPE daily in rehab to express subjective intensity level;Long Term:  Able to use RPE to guide intensity level when exercising independently Short Term: Able to use RPE daily in rehab to express subjective intensity level;Long Term:  Able to use RPE to guide intensity level when exercising independently     Able to understand and use Dyspnea scale Yes Yes Yes     Intervention Provide education and explanation on how to use Dyspnea scale Provide education and explanation on how to use Dyspnea scale Provide education and explanation on how to use Dyspnea scale     Expected Outcomes Short Term: Able to use Dyspnea scale daily in rehab to express subjective sense of shortness of breath during exertion;Long Term: Able to use Dyspnea scale to guide intensity level when exercising independently Short Term: Able to use Dyspnea scale daily in rehab to express subjective sense of shortness of breath during exertion;Long Term: Able to use Dyspnea scale to guide intensity level when exercising independently Short Term: Able to use Dyspnea scale daily in rehab to express subjective sense of shortness of breath during exertion;Long Term: Able to use Dyspnea scale to guide intensity level when exercising independently     Knowledge and understanding of Target Heart Rate Range (THRR) Yes Yes Yes     Intervention Provide education and explanation of THRR including how the numbers were predicted and where they are located for reference Provide education and explanation of THRR including how the numbers were predicted and where they are located for reference Provide education and explanation of THRR including how the numbers were predicted and where they are located for reference     Expected Outcomes Short Term: Able to state/look up THRR;Long Term: Able  to use THRR to govern intensity when exercising independently;Short Term: Able to use daily as guideline for intensity in rehab Short Term: Able to state/look up THRR;Long Term: Able to use THRR to govern intensity when exercising independently;Short Term: Able to use daily as guideline for intensity in rehab Short Term: Able to state/look up THRR;Long Term: Able to use THRR to govern intensity when exercising independently;Short Term: Able to use daily as guideline for intensity in rehab     Understanding of Exercise Prescription Yes Yes Yes     Intervention Provide education, explanation, and written materials on patient's individual exercise prescription Provide education, explanation, and written materials on patient's individual exercise prescription Provide education, explanation, and written materials on patient's individual exercise prescription     Expected Outcomes Short Term: Able to explain program exercise prescription;Long Term: Able to explain home exercise prescription to exercise independently Short Term: Able to explain program exercise prescription;Long Term: Able to explain home exercise prescription to exercise independently Short Term: Able to explain program exercise prescription;Long Term: Able to explain home exercise prescription to exercise independently              Exercise Goals Re-Evaluation :  Exercise Goals Re-Evaluation  [place] Name 10/09/21 1003 11/02/21 1556           Exercise Goal Re-Evaluation   Exercise Goals Review Increase Physical Activity;Increase Strength and Stamina;Able to understand and use rate of perceived exertion (RPE) scale;Able to understand and use Dyspnea scale;Knowledge and understanding of Target Heart Rate Range (THRR);Understanding of Exercise Prescription Increase Physical Activity;Increase Strength and Stamina;Able to understand and use rate of perceived exertion (RPE) scale;Able to understand and use Dyspnea scale;Knowledge and  understanding of Target Heart Rate Range (THRR);Understanding of Exercise Prescription      Comments Pt is to begin exercise this week. Will monitor and progress as tolerated. Pt has attended 6 sessions and has tolerated well. He missed two classes for medical testing.  He is exercising on the nustep at level 4 for 15 min, METs 2.1. Then he walks the track for 15 min, 13 laps for 2.51 METs.  He is progressing appropriately without sx. Will monitor and progress as tolerated.      Expected Outcomes Through exercise at rehab and home, the patient will decrease shortness of breath with daily activities and feel confident in carrying out an exercise regimen at home. Through exercise at rehab and home, the patient will decrease shortness of breath with daily activities and feel confident in carrying out an exercise regimen at home.               Discharge Exercise Prescription (Final Exercise Prescription Changes):  Exercise Prescription Changes - 10/31/21 1500       Response to Exercise   Blood Pressure (Admit) 122/70    Blood Pressure (Exercise) 120/62    Blood Pressure (Exit) 120/84    Heart Rate (Admit) 100 bpm    Heart Rate (Exercise) 125 bpm    Heart Rate (Exit) 110 bpm    Oxygen Saturation (Admit) 97 %    Oxygen Saturation (Exercise) 92 %    Oxygen Saturation (Exit) 94 %    Rating of Perceived Exertion (Exercise) 12    Perceived Dyspnea (Exercise) 2    Duration Continue with 30 min of aerobic exercise without signs/symptoms of physical distress.    Intensity THRR unchanged      Progression   Progression Continue to progress workloads to maintain intensity without signs/symptoms of physical distress.      Resistance Training   Training Prescription Yes    Weight blue bands    Reps 10-15    Time 10 Minutes      Oxygen   Oxygen Continuous    Liters 2      NuStep   Level 3    SPM 70    Minutes 15    METs 1.7      Track   Laps 13    Minutes 15    METs 2.51      Oxygen    Maintain Oxygen Saturation 88% or higher             Nutrition:  Target Goals: Understanding of nutrition guidelines, daily intake of sodium '1500mg'$ , cholesterol '200mg'$ , calories 30% from fat and 7% or less from saturated fats, daily to have 5 or more servings of fruits and vegetables.  Biometrics:  Pre Biometrics - 10/02/21 1238       Pre Biometrics   Grip Strength 24 kg              Nutrition Therapy Plan and Nutrition Goals:  Nutrition Therapy & Goals - 11/02/21 1501  Nutrition Therapy   Diet Heart Healthy Diet    Drug/Food Interactions Statins/Certain Fruits      Personal Nutrition Goals   Nutrition Goal Patient to use the plate method as a daily guide for meal planning to include lean protein/plant protein, fruit, vegetables, whole grains, nonfat dairy as part of heart healthy lifestyle    Personal Goal #2 Patient to identify strategies for weight loss of 0.5-2.0# per week.    Personal Goal #3 Patient to identify food sources and limit daily intake of saturated fat, trans fat, sodium, and refined carbohydrates    Comments Dustin Dean has not lost weight since starting with our program.  He reports making some changes such as reducing sweets/refined carbohydrates at night to aid with weight loss. We further discussed strategies for weight loss including using the plate method as a guide for meal planning, decreasing peanut butter intake/portion, increasing non-starchy vegetables, etc. Further discussed high protein/high fiber snacks, reading food labels for sugar, etc. Reviewed the importance for sodium reduction and benefits of high fiber intake for heart health. He does report good support from his wife.      Intervention Plan   Intervention Prescribe, educate and counsel regarding individualized specific dietary modifications aiming towards targeted core components such as weight, hypertension, lipid management, diabetes, heart failure and other comorbidities.;Nutrition  handout(s) given to patient.    Expected Outcomes Short Term Goal: Understand basic principles of dietary content, such as calories, fat, sodium, cholesterol and nutrients.;Long Term Goal: Adherence to prescribed nutrition plan.;Short Term Goal: A plan has been developed with personal nutrition goals set during dietitian appointment.             Nutrition Assessments:  Nutrition Assessments - 10/12/21 0813       Rate Your Plate Scores   Pre Score 73            MEDIFICTS Score Key: ?70 Need to make dietary changes  40-70 Heart Healthy Diet ? 40 Therapeutic Level Cholesterol Diet  Flowsheet Row PULMONARY REHAB OTHER RESPIRATORY from 10/10/2021 in [place]  Picture Your Plate Total Score on Admission 73      Picture Your Plate Scores: <71 Unhealthy dietary pattern with much room for improvement. 41-50 Dietary pattern unlikely to meet recommendations for good health and room for improvement. 51-60 More healthful dietary pattern, with some room for improvement.  >60 Healthy dietary pattern, although there may be some specific behaviors that could be improved.    Nutrition Goals Re-Evaluation:  Nutrition Goals Re-Evaluation     [place] Name 10/10/21 1555 11/02/21 1501           Goals   Current Weight 210 lb 1.6 oz (95.3 kg) 210 lb 1.6 oz (95.3 kg)      Comment A1c WNL, Lipid panel WNL except HDL 37 A1c 6.2; he continues follow-up with cardiology for TAVR      Expected Outcome Dustin Dean reports motivation to work toward weight loss. He reports eating three meals per day; he does enjoy a wide variety of foods. He lives at home with with his wife who does the majority of the grocery shopping and cooking. She is supportive of lifestyle changes. Dustin Dean has not lost weight since starting with our program.  He reports making some changes such as reducing sweets/refined carbohydrates at night to aid with weight loss and blood sugar control. We further discussed  strategies for weight loss including using the plate method as a guide for meal planning,  decreasing peanut butter intake/portion, increasing non-starchy vegetables, etc. Further discussed high protein/high fiber snacks, reading food labels for sugar, etc. Reviewed the importance for sodium reduction and benefits of high fiber intake for heart health. He does report good support from his wife.               Nutrition Goals Discharge (Final Nutrition Goals Re-Evaluation):  Nutrition Goals Re-Evaluation - 11/02/21 1501       Goals   Current Weight 210 lb 1.6 oz (95.3 kg)    Comment A1c 6.2; he continues follow-up with cardiology for TAVR    Expected Outcome Dustin Dean has not lost weight since starting with our program.  He reports making some changes such as reducing sweets/refined carbohydrates at night to aid with weight loss and blood sugar control. We further discussed strategies for weight loss including using the plate method as a guide for meal planning, decreasing peanut butter intake/portion, increasing non-starchy vegetables, etc. Further discussed high protein/high fiber snacks, reading food labels for sugar, etc. Reviewed the importance for sodium reduction and benefits of high fiber intake for heart health. He does report good support from his wife.             Psychosocial: Target Goals: Acknowledge presence or absence of significant depression and/or stress, maximize coping skills, provide positive support system. Participant is able to verbalize types and ability to use techniques and skills needed for reducing stress and depression.  Initial Review & Psychosocial Screening:  Initial Psych Review & Screening - 10/02/21 1102       Initial Review   Current issues with None Identified      Family Dynamics   Good Support System? Yes    Comments wife Dustin Dean, adoptive children Dustin Dean and his wife      Screening Interventions   Interventions Encouraged to exercise    Expected  Outcomes Long Term goal: The participant improves quality of Life and PHQ9 Scores as seen by post scores and/or verbalization of changes;Short Term goal: Identification and review with participant of any Quality of Life or Depression concerns found by scoring the questionnaire.;Long Term Goal: Stressors or current issues are controlled or eliminated.;Short Term goal: Utilizing psychosocial counselor, staff and physician to assist with identification of specific Stressors or current issues interfering with healing process. Setting desired goal for each stressor or current issue identified.             Quality of Life Scores:  Scores of 19 and below usually indicate a poorer quality of life in these areas.  A difference of  2-3 points is a clinically meaningful difference.  A difference of 2-3 points in the total score of the Quality of Life Index has been associated with significant improvement in overall quality of life, self-image, physical symptoms, and general health in studies assessing change in quality of life.  PHQ-9: Review Flowsheet       10/02/2021  Depression screen PHQ 2/9  Decreased Interest 0 0  Down, Depressed, Hopeless 0 0  PHQ - 2 Score 0 0  Altered sleeping 0  Tired, decreased energy 0  Change in appetite 0  Feeling bad or failure about yourself  0  Trouble concentrating 0  Moving slowly or fidgety/restless 0  Suicidal thoughts 0  PHQ-9 Score 0  Difficult doing work/chores Not difficult at all   Interpretation of Total Score  Total Score Depression Severity:  1-4 = Minimal depression, 5-9 = Mild depression, 10-14 = Moderate depression, 15-19 =  Moderately severe depression, 20-27 = Severe depression   Psychosocial Evaluation and Intervention:  Psychosocial Evaluation - 10/02/21 1103       Psychosocial Evaluation & Interventions   Interventions Encouraged to exercise with the program and follow exercise prescription    Comments No concerns identified     Expected Outcomes For pt to participate in PR.    Continue Psychosocial Services  No Follow up required             Psychosocial Re-Evaluation:  Psychosocial Re-Evaluation     [place] Name 10/11/21 1122 11/07/21 1807           Psychosocial Re-Evaluation   Current issues with None Identified None Identified      Comments Dustin Dean has just started in the PR program and ahs attended one session. We will continue to monitor for any psychosocial needs. Dustin Dean has been participating in the [facility] program with no psychosocial barriers. He states taht he is enjoying the program and interactions with others  in his class.      Expected Outcomes For him to attend the program without any psychosocial issues or concerns. For him to cotinue to attend the program without any psychosocial concerns or barriers.      Interventions Encouraged to attend Pulmonary Rehabilitation for the exercise Encouraged to attend Pulmonary Rehabilitation for the exercise      Continue Psychosocial Services  No Follow up required No Follow up required               Psychosocial Discharge (Final Psychosocial Re-Evaluation):  Psychosocial Re-Evaluation - 11/07/21 1807       Psychosocial Re-Evaluation   Current issues with None Identified    Comments Dustin Dean has been participating in the PR program with no psychosocial barriers. He states taht he is enjoying the program and interactions with others  in his class.    Expected Outcomes For him to cotinue to attend the program without any psychosocial concerns or barriers.    Interventions Encouraged to attend Pulmonary Rehabilitation for the exercise    Continue Psychosocial Services  No Follow up required             Education: Education Goals: Education classes will be provided on a weekly basis, covering required topics. Participant will state understanding/return demonstration of topics presented.  Learning Barriers/Preferences:  Learning Barriers/Preferences  - 10/02/21 1105       Learning Barriers/Preferences   Learning Barriers Sight    Learning Preferences Individual Instruction;Group Instruction             Education Topics: Introduction to Pulmonary Rehab Group instruction provided by PowerPoint, verbal discussion, and written material to support subject matter. Instructor reviews what Pulmonary Rehab is, the purpose of the program, and how patients are referred.     Know Your Numbers Group instruction that is supported by a PowerPoint presentation. Instructor discusses importance of knowing and understanding resting, exercise, and post-exercise oxygen saturation, heart rate, and blood pressure. Oxygen saturation, heart rate, blood pressure, rating of perceived exertion, and dyspnea are reviewed along with a normal range for these values.  Flowsheet Row PULMONARY REHAB OTHER RESPIRATORY from 11/02/2021 in [place]  Date 11/02/21  Educator EP  Instruction Review Code 1- Verbalizes Understanding       Exercise for the Pulmonary Patient Group instruction that is supported by a PowerPoint presentation. Instructor discusses benefits of exercise, core components of exercise, frequency, duration, and intensity of an exercise routine, importance of  utilizing pulse oximetry during exercise, safety while exercising, and options of places to exercise outside of rehab.       MET Level  Group instruction provided by PowerPoint, verbal discussion, and written material to support subject matter. Instructor reviews what METs are and how to increase METs.    Pulmonary Medications Verbally interactive group education provided by instructor with focus on inhaled medications and proper administration.   Anatomy and Physiology of the Respiratory System Group instruction provided by PowerPoint, verbal discussion, and written material to support subject matter. Instructor reviews respiratory cycle and anatomical  components of the respiratory system and their functions. Instructor also reviews differences in obstructive and restrictive respiratory diseases with examples of each.  Flowsheet Row PULMONARY REHAB OTHER RESPIRATORY from 11/02/2021 in [place]  Date 10/26/21  Educator EP  Instruction Review Code 1- Verbalizes Understanding       Oxygen Safety Group instruction provided by PowerPoint, verbal discussion, and written material to support subject matter. There is an overview of "What is Oxygen" and "Why do we need it".  Instructor also reviews how to create a safe environment for oxygen use, the importance of using oxygen as prescribed, and the risks of noncompliance. There is a brief discussion on traveling with oxygen and resources the patient may utilize.   Oxygen Use Group instruction provided by PowerPoint, verbal discussion, and written material to discuss how supplemental oxygen is prescribed and different types of oxygen supply systems. Resources for more information are provided.    Breathing Techniques Group instruction that is supported by demonstration and informational handouts. Instructor discusses the benefits of pursed lip and diaphragmatic breathing and detailed demonstration on how to perform both.     Risk Factor Reduction Group instruction that is supported by a PowerPoint presentation. Instructor discusses the definition of a risk factor, different risk factors for pulmonary disease, and how the heart and lungs work together. Flowsheet Row PULMONARY REHAB OTHER RESPIRATORY from 11/02/2021 in [place]  Date 10/12/21  Educator EP  Instruction Review Code 1- Verbalizes Understanding       MD Day A group question and answer session with a medical doctor that allows participants to ask questions that relate to their pulmonary disease state.   Nutrition for the Pulmonary Patient Group instruction  provided by PowerPoint slides, verbal discussion, and written materials to support subject matter. The instructor gives an explanation and review of healthy diet recommendations, which includes a discussion on weight management, recommendations for fruit and vegetable consumption, as well as protein, fluid, caffeine, fiber, sodium, sugar, and alcohol. Tips for eating when patients are short of breath are discussed.    Other Education Group or individual verbal, written, or video instructions that support the educational goals of the pulmonary rehab program.    Knowledge Questionnaire Score:  Knowledge Questionnaire Score - 10/02/21 1106       Knowledge Questionnaire Score   Pre Score 13/18             Core Components/Risk Factors/Patient Goals at Admission:  Personal Goals and Risk Factors at Admission - 10/02/21 1110       Core Components/Risk Factors/Patient Goals on Admission    Weight Management Weight Loss    Improve shortness of breath with ADL's Yes    Intervention Provide education, individualized exercise plan and daily activity instruction to help decrease symptoms of SOB with activities of daily living.    Expected Outcomes Short Term: Improve  cardiorespiratory fitness to achieve a reduction of symptoms when performing ADLs;Long Term: Be able to perform more ADLs without symptoms or delay the onset of symptoms             Core Components/Risk Factors/Patient Goals Review:   Goals and Risk Factor Review     Row Name 10/11/21 1125 11/07/21 1810           Core Components/Risk Factors/Patient Goals Review   Personal Goals Review Weight Management/Obesity;Improve shortness of breath with ADL's;Develop more efficient breathing techniques such as purse lipped breathing and diaphragmatic breathing and practicing self-pacing with activity.;Increase knowledge of respiratory medications and ability to use respiratory devices properly. Weight Management/Obesity;Improve  shortness of breath with ADL's;Develop more efficient breathing techniques such as purse lipped breathing and diaphragmatic breathing and practicing self-pacing with activity.;Increase knowledge of respiratory medications and ability to use respiratory devices properly.      Review Dustin Dean has just started the program and attended one session. We will continue to monitor his weight and other core components as he progress through the program. Dustin Dean has had a slight weight drop since attending the program. He has had a consult with the dietician. He is exercisng in the nustep,track and treadmill today. He has had inceasing workloads and METS. He is motivate to exercise and improve. He reports mild to mild with dificulty SOB and his oxygen saturations have been 91-96% on 2 liters of oxygen with exercise.      Expected Outcomes See admission goals. See admission goals.               Core Components/Risk Factors/Patient Goals at Discharge (Final Review):   Goals and Risk Factor Review - 11/07/21 1810       Core Components/Risk Factors/Patient Goals Review   Personal Goals Review Weight Management/Obesity;Improve shortness of breath with ADL's;Develop more efficient breathing techniques such as purse lipped breathing and diaphragmatic breathing and practicing self-pacing with activity.;Increase knowledge of respiratory medications and ability to use respiratory devices properly.    Review Dustin Dean has had a slight weight drop since attending the program. He has had a consult with the dietician. He is exercisng in the nustep,track and treadmill today. He has had inceasing workloads and METS. He is motivate to exercise and improve. He reports mild to mild with dificulty SOB and his oxygen saturations have been 91-96% on 2 liters of oxygen with exercise.    Expected Outcomes See admission goals.             ITP Comments: ITP REVIEW Pt is making expected progress toward pulmonary rehab goals after  completing 7 sessions. Recommend continued exercise, life style modification, education, and utilization of breathing techniques to increase stamina and strength and decrease shortness of breath with exertion.   Comments: Dr. Rodman Pickle is Medical Director for Pulmonary Rehab at [facility].

## 2021-11-09 ENCOUNTER — Telehealth (HOSPITAL_COMMUNITY): Payer: Self-pay | Admitting: Family Medicine

## 2021-11-09 ENCOUNTER — Encounter (HOSPITAL_COMMUNITY): Payer: Medicare Other

## 2021-11-11 ENCOUNTER — Encounter: Payer: Self-pay | Admitting: Internal Medicine

## 2021-11-13 NOTE — Telephone Encounter (Signed)
Instructed pt to bring in completed pt assistance paperwork to [facility] on 11/17/21.   Routing to [place] for follow up on paperwork.   Nothing further needed at this time.

## 2021-11-14 ENCOUNTER — Encounter (HOSPITAL_COMMUNITY): Payer: Medicare Other

## 2021-11-16 ENCOUNTER — Telehealth (HOSPITAL_COMMUNITY): Payer: Self-pay | Admitting: Family Medicine

## 2021-11-16 ENCOUNTER — Encounter (HOSPITAL_COMMUNITY): Payer: Medicare Other

## 2021-11-17 ENCOUNTER — Ambulatory Visit (INDEPENDENT_AMBULATORY_CARE_PROVIDER_SITE_OTHER): Payer: Medicare Other | Admitting: Internal Medicine

## 2021-11-17 ENCOUNTER — Encounter: Payer: Self-pay | Admitting: Internal Medicine

## 2021-11-17 VITALS — BP 118/70 | HR 100 | Temp 98.6°F | Ht 68.0 in | Wt 210.8 lb

## 2021-11-17 DIAGNOSIS — Z7189 Other specified counseling: Secondary | ICD-10-CM | POA: Diagnosis not present

## 2021-11-17 DIAGNOSIS — R413 Other amnesia: Secondary | ICD-10-CM

## 2021-11-17 DIAGNOSIS — R768 Other specified abnormal immunological findings in serum: Secondary | ICD-10-CM | POA: Diagnosis not present

## 2021-11-17 DIAGNOSIS — Z5181 Encounter for therapeutic drug level monitoring: Secondary | ICD-10-CM

## 2021-11-17 DIAGNOSIS — J849 Interstitial pulmonary disease, unspecified: Secondary | ICD-10-CM

## 2021-11-17 DIAGNOSIS — Z01811 Encounter for preprocedural respiratory examination: Secondary | ICD-10-CM

## 2021-11-17 DIAGNOSIS — E669 Obesity, unspecified: Secondary | ICD-10-CM

## 2021-11-17 DIAGNOSIS — L299 Pruritus, unspecified: Secondary | ICD-10-CM

## 2021-11-17 DIAGNOSIS — F32A Depression, unspecified: Secondary | ICD-10-CM

## 2021-11-17 DIAGNOSIS — J3489 Other specified disorders of nose and nasal sinuses: Secondary | ICD-10-CM

## 2021-11-17 DIAGNOSIS — F419 Anxiety disorder, unspecified: Secondary | ICD-10-CM

## 2021-11-17 DIAGNOSIS — G9331 Postviral fatigue syndrome: Secondary | ICD-10-CM

## 2021-11-17 LAB — PULMONARY FUNCTION TEST
DL/VA % pred: 106 %
DL/VA: 4.36 ml/min/mmHg/L
DLCO cor % pred: 68 %
DLCO cor: 16.87 ml/min/mmHg
DLCO unc % pred: 67 %
DLCO unc: 16.63 ml/min/mmHg
FEF 25-75 Pre: 2.47 L/sec
FEF2575-%Pred-Pre: 105 %
FEV1-%Pred-Pre: 56 %
FEV1-Pre: 1.71 L
FEV1FVC-%Pred-Pre: 119 %
FEV6-%Pred-Pre: 49 %
FEV6-Pre: 1.92 L
FEV6FVC-%Pred-Pre: 106 %
FVC-%Pred-Pre: 46 %
FVC-Pre: 1.94 L
Pre FEV1/FVC ratio: 88 %
Pre FEV6/FVC Ratio: 100 %

## 2021-11-17 LAB — HEPATIC FUNCTION PANEL
ALT: 10 U/L (ref 0–53)
AST: 16 U/L (ref 0–37)
Albumin: 4 g/dL (ref 3.5–5.2)
Alkaline Phosphatase: 86 U/L (ref 39–117)
Bilirubin, Direct: 0.1 mg/dL (ref 0.0–0.3)
Total Bilirubin: 0.4 mg/dL (ref 0.2–1.2)
Total Protein: 7.5 g/dL (ref 6.0–8.3)

## 2021-11-17 NOTE — Telephone Encounter (Signed)
He brought assistance paperwork for nintedanib from [facility] cares.  However he is on pirfenidone.  Therefore I told him that he could throw the paperwork away.  He has not received any assistance paperwork for pirfenidone.  Also please call his cardiologist at [facility] and find out if it is okay for him to restart pulmonary rehabilitation in the setting of aortic stenosis which the patient believes is critical.  Let me know what the office status

## 2021-11-17 NOTE — Progress Notes (Signed)
Cardiology Dr. Loni Dean 02/15/20 Dustin Dean is 71 y.o. year old male with a history of No prior cardiovascular disease who presents to [facility] for the evaluation of a heart murmur. This patient is a non-smoker and denies a previous history of myocardial infarction, congestive heart failure, or valvular heart disease. 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 He really has little in the way of exertional chest symptoms. Occasionally according to the wife if he walks up an incline briskly he may have some minor shortness of breath but remains active and has little in the way limitation. He denies a previous history of myocardial infarction, congestive heart failure, or known valvular heart disease. At a recent examination he was found to have a heart murmur and an echocardiogram was performed. It demonstrated a peak gradient across his aortic valve of 30 mmHg and an aortic valve area of 1.1 cm. This was read as being consistent with moderate aortic stenosis. I had a fairly significant discussion with the patient and his wife about the implications of his aortic valve and the need to follow the aortic stenosis progression over time.  11/14/2020 office visit with Dustin Dean pulmonary physician assistant at [facility] has past medical history of restrictive lung disease, interstitial lung disease, allergic rhinitis, laryngeal pharyngeal reflux, arthritis, lung nodules and persistent cough. He is a non-smoker and has never smoked. He has medication allergy to pseudoephedrine.  Dustin Dean presents today with complaint of a progressively worsening shortness of breath and dyspnea on exertion over the past several months. We have been following his interstitial lung disease since 2018. He has been stable without complaint of dyspnea on exertion over the past several years. He indicates that recently he has had more shortness of  breath with exertion. He and his wife will walk the dog in their usual route through the neighborhood that they have done for many years. He reports that he will get short of breath along the root in a way that he has never been bothered with previously. He has recently requested an albuterol inhaler and reports that it has been helpful. He has been using Symbicort 160/4.5 twice daily but has been having difficulty using it successfully as he has been coughing significantly and loses a lot of the medication. A recent chest CT conducted 10/24/2020 notes a progression in the interstitial changes. A 9 mm right lower lobe nodule is also noted. The plan is to have a 56-monthfollow-up chest CT conducted 02/04/2021. A 6-minute walk test conducted today shows oxygen desaturation and need for supplemental oxygen. I will place the order for supplemental oxygen. [facility]   Patient at rest on RA with baseline sats : 97% After exertion for 3 min on RA pt's sats were : 86% Patient placed on oxygen via nasal cannula at 2 lpm flow via portable oxygen concentrator device Exertion for 6 mins with oxygen confirmed final sats at 94%   #1 lung nodules Chest CT 10/24/2020 shows 9 mm right lower lobe lung nodule Follow-up chest CT scheduled for 365-montheevaluation in January 2023  2. Interstitial lung disease Chest CT 11/04/2020 notes fibrotic interstitial lung disease with evidence of progression compared to 08/28/2020 [facility]   #Primary Care  dec 2022 Osteopenia Due for DXA. Prostate cancer Saw Dr. Louis Dean at [facility]. Biopsy + for cancer. They are just watching it. He is going to have MRI and another biopsy next visit.   OV 01/24/2021 -evaluation and transfer of care to Dr. Chase Dean in ILD center in [place].  Referred by Marlane Dean patient support group leader for the pulmonary fibrosis foundation support group  Subjective:  Patient ID: Dustin Dean, male , DOB: 1950/02/03 , age 71 y.o. , MRN: [id number] , ADDRESS: [place] [id number] PCP Dustin Boers, MD Patient Care Team: Dustin Boers, MD as PCP - General (Family Medicine)  This Provider for this visit: Treatment Team:  Attending Provider: Brand Males, MD    01/24/2021 -   Chief Complaint  Patient presents with   Consult    Pt is here for a switch of providers to take over care for his pulmonary fibrosis. 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 Pt does become SOB with activities and also states that he does have a chronic cough.     HPI  Dustin Dean 71 y.o. -new evaluation for interstitial lung disease.  History is provided by review of the chart and also talking to the patient and his wife.  He tells me that he has had chronic cough since his teenage years.  But for the last few to several years its been more consistent.  He was seeing the physician assistant at [facility].  Somewhere along the way he got referred to Dr. Dennison Dean right at [facility] some 3 years ago.  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  After that cough is largely improved except for occasional exacerbations in the fall in the winter.  Then in 2018 sometime after he went on gabapentin he was informed by the physician assistant that he might have early ILD.  He does not recollect any serology or biopsy being done.  He does recollect a few pulmonary function test.  Then all of a sudden starting September 2022 his cough "got out of hand" and also developed worsening shortness of breath.  This the first time he started developing worsening shortness of breath.  Then by November 2022 started needing exertional oxygen leading to current symptoms.  He was referred to [facility] but he preferred to establish with the ILD  center here in [place] with [place] based on [facility] recommendation.  He found Marlane Dean through Internet search for pulmonary fibrosis foundation.  His current symptom score is below.  He is not on any antifibrotic.  [facility] Comprehensive ILD Questionnaire  Symptoms:  SYMPTOM SCALE - ILD 01/24/2021  Current weight   O2 use 2L [place] with ex  Shortness of Breath 0 -> 5 scale with 5 being worst (score 6 If unable to do)  At rest 0  Simple tasks - showers, clothes change, eating, shaving 1  Household (dishes, doing bed, laundry) 1  Shopping 2  Walking level at own pace 5  Walking up Stairs 5  Total (30-36) Dyspnea Score 14  How bad is your cough? 2  How bad is your fatigue 1  How bad is nausea 00000  How bad is vomiting?  0  How bad is diarrhea? 0  How bad is anxiety? 0  How bad is depression 0  Any chronic pain - if so where and how bad 0       Past Medical History :  -  He has obesity BMI 33-34 - Denies any collagen vascular disease - He is known to have aortic stenosis -sees cardiologist at [facility].  December 2021 echocardiogram EF 55 to 60% with tricuspid aortic valve and moderate stenosis normal right ventricle -Denies any asthma or COPD.  Denies any heart failure denies any collagen vascular disease -Denies snoring or excessive daytime somnolence.  Denies stroke.  Denies formal diagnosis of sleep apnea denies formal diagnosis of pulmonary hypertension [echo a year ago did not show pulmonary hypertension] -Has had vaccination against COVID but has not had COVID disease -Recent diagnosis of late 2022 early stage prostate cancer at [facility] on observation treatment -Has history of skin cancer for which he apply sunscreen regularly  ROS:  -Shortness of breath present Cough chronic plus -Has intermittent chronic diarrhea and takes Imodium but no other GI side effects -Possible Raynaud's for the last several decades  FAMILY HISTORY of LUNG DISEASE:   Denies family for pulmonary fibrosis.  Denies family Struve COPD or asthma sarcoid or cystic fibrosis.  Denies family history of hypersensitive pneumonitis.  Denies autoimmune disease.  Denies any premature graying of the hair or albinism  PERSONAL EXPOSURE HISTORY:  No prior cigarette smoking -No marijuana use of cocaine use no intravenous drug use.  HOME  EXPOSURE and HOBBY DETAILS :  -Single-family home in a one-story space.  [place] setting age of the current home is 31 years.  He is lived there for 6 years.  He does use a feather pillow and this occasionally mold or mildew in the bathroom.  All his life he is done active gardening with mulch and woodchips and occasionally damp soil.  He is worked out in the yard quite a bit and does gardening.  He enjoys the gardening.  OCCUPATIONAL HISTORY (122 questions) : -He worked in the [employer] at [employer] and then [employer] no [employer].  He does not remember if the buildings had mold in it.  He does not think so -Detail greater than 100 question organic and inorganic antigen history exposure is negative  PULMONARY TOXICITY HISTORY (27 items):  Denies  INVESTIGATIONS: Report of pulmonary function test but I am not able to get it in Care Everywhere     Simple office walk 185 feet x  3 laps goal with forehead probe 01/24/2021    O2 used Ra at rest, desat at 1 lap and then walked with 2L piulse   Number laps completed 1 lap on RA, then 3 laps on 2L   Comments about pace x   Resting Pulse Ox/HR 96% and 88/min at 1 lap   Final Pulse Ox/HR 92% and 115/min on 3 laps on 2L pulsed   Desaturated </= 88% no   Desaturated <= 3% points yes   Got Tachycardic >/= 90/min yes   Symptoms at end of test Modreate dyspnea   Miscellaneous comments Corrected with 2L [place]      High-resolution CT scan of the chest August 2020  -There is a report of honeycombing but without any craniocaudal gradient.  Air-trapping reported  alternative pattern more suggestive of chronic hypersensitive pneumonitis.  But there is already progression from August 2019 -Read by Dr. Vinnie Langton   CT Chest data- HRCT 11/04/20 unable for my visualization.   CLINICAL DATA:  Worsening shortness of breath   EXAM:  CT CHEST WITHOUT CONTRAST   TECHNIQUE:  Multidetector CT imaging of the chest was performed following the  standard protocol  without intravenous contrast. High resolution  imaging of the lungs, as well as inspiratory and expiratory imaging,  was performed.   COMPARISON:  Chest CT dated August 29, 2018   FINDINGS:  Cardiovascular: Normal heart size. No pericardial effusion. Coronary  artery calcifications of the circumflex. Aortic valve  calcifications. Minimal calcified plaque of the thoracic aorta.   Mediastinum/Nodes: Mildly enlarged mediastinal lymph nodes,  unchanged compared to prior exam and likely reactive. Esophagus and  thyroid are unremarkable.   Lungs/Pleura: Central airways are patent. Bilateral air trapping.  Peribronchovascular and subpleural reticular opacities with traction  bronchiectasis and no clear craniocaudal predominance. Honeycomb  change primarily seen in the anterior upper lobes is increased when  compared to prior exam. New linear nodular opacity of the right  lower lobe measuring up to 9 mm on series 4, image 210.   Upper Abdomen: No acute abnormality.   Musculoskeletal: No chest wall mass or suspicious bone lesions  identified.   IMPRESSION:  Fibrotic interstitial lung disease with evidence of progression when  compared with August 29, 2018 prior exam. Favor fibrotic  hypersensitivity pneumonitis given presence of air trapping.  Findings are suggestive of an alternative diagnosis (not UIP) per  consensus guidelines: Diagnosis of Idiopathic Pulmonary Fibrosis: An  Official ATS/ERS/JRS/ALAT Clinical Practice Guideline. [place], Iss 5, [phone], Sep 15 2016.   New linear nodular opacity of the right lower lobe measuring up to 9  mm, likely an area of focal fibrosis. Recommend follow-up chest CT  in 3 months to ensure stability.   Aortic Atherosclerosis (ICD10-I70.0).   Addendum 01/26/2021 -radiology Dr. Weber Cooks wrote back saying CT scan is classic for chronic HP.  We will call the patient and get the patient started on nintedanib counseling which is first-line for progressive non-- IPF ILD.  If he is reluctant then we will do pirfenidone.  We will set up pharmacy counseling.  If he is reluctant for nintedanib based on risk versus benefit we will do pirfenidone  Electronically Signed    By: Yetta Glassman M.D.    On: 11/04/2020 14:17  No results found.   Phone visit 01/28/21  Emilyh  LEt Dustin Dean know ahead of the visit followup with me on 02/23/21   A) making referral to [facility] to discuss ofev which would be first line baed on what radiologist told me about CT and fact rheumaotid facot is positive  B) rheumatoid factor strongly positive - refer rheumatology Dr Deveshwar/Rice  C) I reviewed echo report (below) Will discuss at followup  Aortic Valve: There is moderate stenosis, with peak and mean gradients  of 44.000 and 27.000 mmHg.  Aortic valve area calculates to approximately  1.4 cm    Mitral Valve: There is mild regurgitation.    Tricuspid Valve: There is mild regurgitation.    Tricuspid Valve: The right ventricular systolic pressure is normal (<36  mmHg).   1.  Adequate 2D M-mode and color flow Doppler echocardiogram demonstrates  normal left ventricular chamber size and contractility.  There may be mild  left ventricular hypertrophy.  Ejection fraction is estimated 65%  2.  The aortic valve is thickened and demonstrates reduced leaflet  mobility.  There is mild to moderate aortic stenosis with aortic valve  area calculating to 1.4 cm  3.  Mild mitral annular calcification is noted but there is good leaflet   mobility.  Mild mitral insufficiency is noted  4.  Tricuspid valve appears to  be normal  5.  The atria are grossly normal size bilaterally  6.  The right ventricular chamber size and function is normal  6.  The pericardium is of normal thickness there is no effusion  OV 02/23/2021  Subjective:  Patient ID: Dustin Dean, male , DOB: 1950/07/29 , age 53 y.o. , MRN: [id number] , ADDRESS: [place] [id number] PCP Dustin Boers, MD Patient Care Team: Dustin Boers, MD as PCP - General (Family Medicine)  This Provider for this visit: Treatment Team:  Attending Provider: Brand Males, MD    02/23/2021 -   Chief Complaint  Patient presents with   Follow-up    Pt recently had a HRCT and PFT and is here today to discuss the results.   Chronic cough on gabapentin  Interstitial lung disease work-up in progress -concern for hypersensitive pneumonitis  -Progressive phenotype between August 2020 and October 2022  9 mm right lower lobe nodule seen in October 2022 for the first time: Stable January 2023  HPI Dustin Dean 71 y.o. -since his last visit Dr. Weber Cooks the radiologist did look at the CT scan and this confirmed his CT is not consistent with UIP but more likely an alternate pattern consistent with hypersensitive pneumonitis as seen by air trapping.  Patient has had serology work-up in which it is all negative except rheumatoid factor strongly positive.  He is yet to see the rheumatologist.  He does have significant restrictions on the PFTs with reduced DLCO consistent with his obesity and ILD.  His symptoms are overall stable.  Did go through his exposure history again.  He says in 200 07/2006 and again in 2012 he had had 3 episodes of pneumonia.  All of this happened when he entered school buildings.  He was living in the same house at that time but the house itself was Dustin-new and there is no mold or mildew.  He is wondering about crawlspace because only [place] he has seen crawlspace but he said he has never been in the crawl space.  He does not know if there was mold in the crawl space.  I told him it is doubtful that mold or mildew from the crawlspace can get into the house.  He did have a feather pillow but since his last visit he is thrown that out.  He does do gardening and does get exposed to dampness.  At this point in time did discuss with him that he does have ILD and it is progressive.  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  Unclear if the positive rheumatoid factor is playing a role it could be if he has positive for rheumatoid arthritis.  Did indicate that ultimately only a biopsy can put to rest if he has IPF [being Caucasian gentleman greater than 65 is classic phenotype for IPF] but did indicate to him that given progression does not matter whether it is IPF or non-IPF antifibrotic's is indicated.  He is already met with the pharmacist and discussed the 2 antifibrotic's.  Did indicate to him that nintedanib is first-line and non-- IPF progressive phenotype.  Did discuss about the diarrhea.  Denies any contraindication.  Therefore we will start this.  Did discuss whether diarrhea is willing to go through this.  Also discussed about ILD-Pro registry.  He is interested given the consent form.  Did indicate to him that it probably would be beneficial to do some kind of a limited work-up in differentiating his  etiology.  The reasons would be to participate in clinical trials but also in case he needs immunosuppression such as prednisone or CellCept [indicated for hypersensitive pneumonitis but not for IPF] having a specific diagnosis would be beneficial.  I did think that his obesity puts him at some risk for getting surgical lung biopsy and therefore it might be better to start off with bronchoscopy with lavage and transbronchial biopsies.  We can address this in the future.  This visit was just focused on  therapies.  Regarding his 9 mm lower lobe nodule: He had a CT scan of the chest and shows this is stable from October 2022 through January 2023.  The CT scan again reads as alternative pattern consistent with chronic HP.    CT Chest data January 2023  No results found.  Mediastinum/Nodes: Multiple prominent borderline but nonenlarged mediastinal and bilateral hilar lymph nodes, similar to the prior study esophagus is unremarkable in appearance. No axillary lymphadenopathy.   Lungs/Pleura: High-resolution images again demonstrate widespread but patchy areas of ground-glass attenuation, septal thickening, subpleural reticulation, thickening of the peribronchovascular interstitium, cylindrical and varicose traction bronchiectasis, peripheral bronchiolectasis and extensive honeycombing. There is no discernible craniocaudal gradient. The most extensive areas of honeycombing are anterior lung, predominantly in the upper lobes and right middle lobe. Some areas of the lung bases demonstrate relative sparing. Inspiratory and expiratory imaging demonstrates mild to moderate air trapping indicative of small airways disease. No definite progression compared to the recent prior study. No acute consolidative airspace disease. No pleural effusions. Previously described nodular area of architectural distortion in the right lower lobe (axial image 106 of series 8) is stable measuring 8 mm on today's examination, likely part of the underlying fibrosis. No other definite suspicious appearing pulmonary nodules or masses are noted.   Upper Abdomen: Unremarkable.   Musculoskeletal: There are no aggressive appearing lytic or blastic lesions noted in the visualized portions of the skeleton.   IMPRESSION: 1. 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 2. Previously noted nodular area of architectural distortion in the right lower lobe is unchanged, likely part of the fibrosis rather than a pulmonary nodule. 3. There are calcifications of the aortic valve and mitral annulus. Echocardiographic correlation for evaluation of potential valvular dysfunction may be warranted if clinically indicated. 4. Aortic atherosclerosis, in addition to 2 vessel coronary artery disease. Please note that although the presence of coronary artery calcium documents the presence of coronary artery disease, the severity of this disease and any potential stenosis cannot be assessed on this non-gated CT examination. Assessment for potential risk factor modification, dietary therapy or pharmacologic therapy may be warranted, if clinically indicated.   Aortic Atherosclerosis (ICD10-I70.0).     Electronically Signed   By: Vinnie Langton M.D.   On: 02/14/2021 06:17 stable January 2023  PFT       Latest Reference Range & Units 01/27/21 15:53 01/27/21 15:54  Anti Nuclear Antibody (ANA) NEGATIVE   NEGATIVE  Angiotensin-Converting Enzyme 9 - 67 U/L  49  Anti JO-1 0.0 - 0.9 AI  <0.2  Cyclic Citrullin Peptide Ab UNITS  <16  ds DNA Ab IU/mL  1  ENA RNP Ab 0.0 - 0.9 AI  0.4  RA Latex Turbid. <14 IU/mL  323 (H)  SSA (Ro) (ENA) Antibody, IgG <  1.0 NEG AI  <1.0 NEG  SSB (La) (ENA) Antibody, IgG <1.0 NEG AI  <1.0 NEG  Scleroderma (Scl-70) (ENA) Antibody, IgG <1.0 NEG AI  <1.0 NEG  QUANTIFERON-TB GOLD PLUS   Rpt  A.Fumigatus #1 Abs Negative  Negative   Micropolyspora faeni, IgG Negative  Negative   Thermoactinomyces vulgaris, IgG Negative  Negative   A. Pullulans Abs Negative  Negative   Thermoact. Saccharii Negative  Negative   P[place]on Serum Abs Negative  Negative   (H): Data is abnormally high     OV 03/23/2021- acute  video visit due to concerns for acute symptoms ? Ofev side effect  Subjective:  Patient ID: Dustin Dean, male , DOB: 1950-02-20 , age 71 y.o. , MRN: [id number] , ADDRESS: [place] [id number] PCP Dustin Boers, MD Patient Care Team: Dustin Boers, MD as PCP - General (Family Medicine)  This Provider for this visit: Treatment Team:  Attending Provider: Brand Males, MD  Type of visit: Video Circumstance: COVID-19 national emergency Identification of patient Dustin Dean with 1950-12-15 and MRN [id number] - 2 person identifier Risks: Risks, benefits, limitations of telephone visit explained. Patient understood and verbalized agreement to proceed Anyone else on call: just patient Patient location: Inpatient bed at [facility] which is another health system This provider location: [address] pulmonary office [address]  [place], [place] [address].   03/23/2021 -in this video visit was set up on acute basis because he started having symptoms after starting nintedanib.  He wanted to know if it was nintedanib symptoms.  The symptoms sound rather complex and little bit out of proportion for nintedanib.  Therefore I scheduled this video visit and to my surprise he is sitting in a hospital bed at [facility] hospital.    HPI Dustin Dean 71 y.o. - started ofev mid-feb 2023. Started noticing feeling disoriented and at times weak. Then on 3/4-03/19/21 was feeling very weak whole bidy weakness and needed walker to even go across room. Tired. Stopped going to gym. Was using o2 all the time. Then on 03/21/21 feelign cold. ANd ahd fever  102F.  Does not remember Tuesday much. Initialyt Dx is pneumonia and testing/imaging -> by tthen pccm saw him Dr Dan Europe. Procal negative. WBC normal. Concerns is ILD flare up and recommendation is prednisone.  CT scan did not show pneumonia Curently on IV solumedrol. Currently some better. Yesterday ate well. Currently on  2.5LN . He has been covid negative. Multiplex resp virus panel negative per outside chart revie  With ofev was having some nausea. No abd pain. No diarrhea. No stomach cramps. No vomitting   Currently ofev on hold    03/28/2021 Follow up : ILD , Post hospital follow up  Patient returns for a 1 week follow-up visit.  Patient was seen in January 2023 for second opinion for interstitial lung disease.  Patient was felt to have possible concern for hypersensitivity pneumonitis.  He has had a progressive phenotype noted on CT scan from August 2020 to October 2022.  Serology was negative except for a strongly positive rheumatoid factor.  He was referred to rheumatology.  PFT showed significant restriction and reduced DLCO.  Has a history of recurrent pneumonia.  He was recommended to begin antifibrotic's. With OFEV.  Patient was hospitalized last week with severe dyspnea, weakness, activity intolerance to point he could not walk. Confusion, low oxygen levels, and fever-tmax 102. Kristeen Miss he has a viral illness and ILD flare.  Patient was hospitalized at an outlying hospital.  He was treated with steroids.  Discharged on a steroid taper. CT chest 03/22/21 report showed fibrotic ILD, stable since 10/2020, no acute consolidation.  Covid , Influezna  and Viral panel were neg.  Since getting out of the hospital patient is feeling much better. Energy level has picked, decreased oxygen demands, no fever. Appetite is some better . Patient is concerned this could have been from [facility] as his symptoms started when he started OFEV.  Is on Oxygen with activity and At bedtime  2l/m . At rest O2 sats are >90%.      MDD 03/28/21 MDD Impression/Recs: Recent flare up. Bx can be prohibitive. get rheum eval and if neg - manage as HP. Maybe at the most BAL if stable  OV 04/05/2021  Subjective:  Patient ID: Dustin Dean, male , DOB: Dustin 02, 1952 , age 10 y.o. , MRN: [id number] , ADDRESS: [place]  [id number] PCP Dustin Boers, MD Patient Care Team: Dustin Boers, MD as PCP - General (Family Medicine)  This Provider for this visit: Treatment Team:  Attending Provider: Brand Males, MD    04/05/2021 -   Chief Complaint  Patient presents with   Follow-up    Pt states he is feeling better after last hospital stay and states each day is getting better.   Chronic cough on gabapentin  Interstitial lung disease work-up in progress -concern for hypersensitive pneumonitis  -Progressive phenotype between August 2020 and October 2022   - started ofev mid feb 2023 -> early march 2023 admitted for ILD flare  9 mm right lower lobe nodule seen in October 2022 for the first time: Stable January 2023  HPI Dustin Dean 71 y.o. -returns for followup. He is on 1.5 tab of pred curently . Next week is 1 tab pred and off. He is better. Feeling better. AT rest not using o2 because he is better. In fact symp score shows huge imrpovement. Walking desat test is also largey imprpvd. All ? Steroid effects. We had conversation about his recent flare up  - has hx of recurrent pna: 2005-2006 x 2 episodes. . Lived in a different house. Then again pneumonia 5 years ago in same house. Rx in urgent care. Had high fever. Current started abruptly 2 weeks into ofev andhe wondering if due to ofev. Did not have classic Ofev sid effects. Mentioned to him Ofev reduces riks of flare.  - gardening: no longer gardening  - featherpillow - got rid of it even after last visit   -visible mold - denies but he discussed his crawl space. Says "there is no mold bu fungs on the top wall". 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 Biopsy indicated if stable but first rheum consult    04/26/2021- Interim hx  Patient presents today for ILD follow-up. He has been on OFEV for three weeks, currently taking 137m twice daily. He has been off oral prednisone for two weeks. Breathing is  not significantly worse off steroids. Cough is the same. Since Saturday he developed chills, fatigue and low grade fever at night. Temp 100.1-100.3. He reprots some heavy breathing. Oxygen 92% or higher. Urinary frequency. Having some incontinence. Continues on Gabapentin for chronic cough. Referred to rheumatology, has an apt beginning of June. He has been avoid exposures mold/mildew, gardening.     05/02/2021 Follow up : ILD , Fever  Patient presents for  an acute office visit.  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  Serial CT chest from August 2020 to August 2022 showed progressive changes.  Serology was negative except for a positive rheumatoid factor.  He has been referred to rheumatology with consult pending.  Pulmonary function testing showed significant restriction and decreased DLCO.  Patient started Ofev on March 01, 2021.  Patient was admitted early March with severe weakness dyspnea and activity intolerance.  He also had intermittent confusion hypoxemia and a fever Tmax of 102.  He was felt to have an ILD flare with possible viral illness however COVID, influenza and viral panel were all negative.  He was treated with steroids.  CT chest showed fibrotic ILD stable since October 2022.  His Ofev was held during admission.  After discharge he said that his energy level was improved and had decreased oxygen demands.  Based on oxygen after discharge was oxygen 2 L with activity and at bedtime.  Not requiring oxygen at rest with O2 saturations greater than 90%.  Patient was restarted on Ofev April 05, 2020.  After 2 weeks of been on Ofev patient developed low-grade fever.  This is waxed and waned and yesterday developed a fever Tmax of 103.  Patient went to the emergency room work-up was unrevealing.  CT chest showed stable ILD with no acute process.  CBC was normal blood cultures are no growth to date.   Respiratory viral panel was negative.  Lactic acid was 1.5 urinalysis was normal, urine culture is pending.  LFTs were slightly elevated.  Since last night patient says he is feeling better. Fever is resolved. Took Tylenol . O2 saturations are 100%.  Has some sore throat that started late night .  Had good appetite today . No fever today . No increased Oxygen demands.  Wife is [name].  Patient denies any rash, skin lesions, skin redness, joint swelling, insect or tick bite, travel, discolored mucus, abdominal pain, nausea vomiting or diarrhea. Has no previous joint replacement or surgeries. Abdominal hernia repair >34yr ago. No abd pain,  [facility]  2D echo January 25, 2018 3 aortic valve is thickened with mild to moderate aortic valve stenosis.,  EF 55 to 60%.  Right ventricle is normal systolic function is normal.     OV 05/19/2021  Subjective:  Patient ID: KBethena Dean male , DOB: 5August 15, 1952, age 71y.o. , MRN: [id number], AD[address]ESS: [id number] BJoneen Boers MD Patient Care Team: BJoneen Boers MD as PCP - General (Family Medicine)  This Provider for this visit: Treatment Team:  Attending Provider: RBrand Males MD    05/19/2021 -   Chief Complaint  Patient presents with   Follow-up    PFT performed today.  Pt states since stopping the OFEV, he has not had anymore fever and states overall he has felt good.    Chronic cough on gabapentin  - well controlled  Interstitial lung disease work-up in progress -concern for hypersensitive pneumonitis  -Progressive phenotype between August 2020 and October 2022   - started ofev mid feb 2023 -> early march 2023 admitted for ILD flare ->   9 mm right lower lobe nodule seen in October 2022 for the first time: Stable January 2023  HPI KMichaeljohn Biss89y.o. -returns with wife [name].  He tells me that after last visit he did a rechallenge with nintedanib and he  picked up fever and  ended up in the emergency room very similar to March 2023 hospitalization.  Symptoms started 1-2 weeks into taking nintedanib.  His wife feels strongly this is nintedanib related.  Have not seen the patient gets significant side effects from nintedanib but have seen one of the patient gets something similar with pirfenidone.  That particular patient had even after 1 dose.  I think the strength of evidence here is that this systemic inflammatory response is probably related to nintedanib.  Therefore he has stopped it.  I have advised him against taking nintedanib ever again.  Clinically he is feeling stable.  His walking desaturation test is stable.  His pulmonary function test is also stable.  He uses 2 L with exertion but today in office when we walked him on room air he dropped 7 points and this is similar to before.  He wants to try pirfenidone.  We have discussed this in the past.  We also discussed prednisone but he does not want to do prednisone because of hyperglycemia.  We discussed the risk, benefits and limitations of pirfenidone.  Pirfenidone is not well studied in chronic HP.  Is only studied in 1 subtype of non-IPF progressive phenotype.  Nevertheless is the only other antifibrotic option available.  He wants to try a donor sample.  We will give him 2 months worth.  He is traveling to the beach  once he comes back he will take it    07/03/2021 Follow up : ILD  Patient returns for a 6-week follow-up.  Patient has underlying interstitial lung disease with progressive phenotype.  Serial CT chest from August 20 to August 2022 showed progressive changes.  Serology was negative except for positive rheumatoid factor.  Rheumatology consult is pending.  Previous PFTs have showed significant restriction and decreased diffusing capacity.  Patient was started on Ofev March 01, 2021.  Patient had a SIRS like response to OFEV x 2 .  Ofev has been stopped indefinitely and advised not to restart.  Patient was  placed on a drug holiday.  Started on Esbriet 1 month ago.  Patient says since starting Esbriet he seems to be tolerating very well.  LFTs prior to start had returned back to normal.  Previously were elevated.  Patient says he has had minimal nausea.  No diarrhea no upset stomach and appetite seems to be normal.  Patient says he has been using sunscreen when he is out side.  Has had no rash.  We discussed returning for ongoing labs. Patient has been referred to pulmonary rehab but has not contacted today. Patient says overall he is doing okay.  Wears his oxygen with activity as needed.  And at bedtime.  Has had no increased oxygen demands. Patient says he still gets short of breath with heavy activities.  But feels like he is stable at this point.  Has had no return of fever.      OV 09/08/2021  Subjective:  Patient ID: Dustin Dean, male , DOB: 08-31-1950 , age 61 y.o. , MRN: [id number] , ADDRESS: [address] [place] [id number] PCP Dustin Boers, MD Patient Care Team: Dustin Boers, MD as PCP - General (Family Medicine)  This Provider for this visit: Treatment Team:  Attending Provider: Brand Males, MD    09/08/2021 -   Chief Complaint  Patient presents with   Follow-up    Pt states he has been doing okay since last visit and denies any complaints.  Chronic cough on gabapentin  - well controlled  Interstitial lung disease work-up in progress -concern for hypersensitive pneumonitis  -Progressive phenotype between August 2020 and October 2022   - started ofev mid feb 2023 -> early march 2023 admitted for ILD flare -> rechallenged ofev April 2023 -> ER vist SIRS   - SAE with OFev/ Ofev stopped  - started Pirfenioned May 2023 (rjected prednisone due to side effec tprofile)  9 mm right lower lobe nodule seen in October 2022 for the first time: Stable January 202  HPI Marquail Bradwell 71 y.o. -returns for follow-up.  He tells me that he is lost more weight.  He is  stable.  His symptom score shows good stability.  He is tolerating pirfenidone really well.  He has lost weight.  Current BMI is 31 but it is all intentional with the help of exercise, pirfenidone and also eating less calories.  No side effects from pirfenidone no sunburn nothing.  Is taking 3 to 2 pills 3 times daily.  He is willing to roll over to 1 big pill 3 times daily.  His main questions were -He wants to do some gardening such as cut wine but he assures me that will not be any exposure to organic dust.  -He wants to donate his body after death to medical school.  I advised him that either [facility] or [facility] is fine.  -He also has aortic stenosis.  He saw cardiology today at [facility].  He saw Dr. Loni Dean.  He is being referred to surgery at [place] for TAVR consideration.  According to the discussion [not clearly apparent in the chart review] TAVR is being recommended early because of his pulmonary fibrosis.  I did explain to him that in advance pulmonary fibrosis surgical risk is high but did want him to ask the surgeon if there is any downside to operating or placing TAVR procedure when the aortic stenosis is not critical.  PFT   OV 11/17/2021  Subjective:  Patient ID: Dustin Dean, male , DOB: 05/01/1950 , age 89 y.o. , MRN: [id number] , AD[address]ESS: [address] [place] [id number] PCP Dustin Boers, MD Patient Care Team: Dustin Boers, MD as PCP - General (Family Medicine)  This Provider for this visit: Treatment Team:  Attending Provider: Brand Males, MD    11/17/2021 -   Chief Complaint  Patient presents with   Follow-up    PFT performed today.  Pt states he has multiple concerns to discuss.     HPI Jaskirat Schwieger 71 y.o. -returns for follow-up.  He is doing well overall.  However for the last 3 days he has had a runny nose and with that he is having decreased energy.  He is got decreased social interest.  He also feels  his memory is failing him more in the last 3 days he has noted some memory changes for the last several months but it is a lot worse in the last 3 days since the runny nose.  His COVID is negative but there is no fever brown sputum or yellow sputum.  There is no significant drainage.  There is no worsening shortness of breath there is no worsening cough there is no chills.  In addition he has new complaint of tender itchy scalp for the last 3 weeks there is no rash but he has an appointment with dermatologist because his scalp is dry and flaky.  He is not doing gardening  but he does go out occasionally in the sun and he does not wear sunscreen or hat.  He is on pirfenidone which can cause skin rash and especially in the setting of sun exposure.  I did caution him this could be because of pirfenidone but he is going to see a dermatologist anyways.  Outside of this he is tolerating his pirfenidone well.  I did indicate to him he can roll himself into 1 big pill 3 times daily instead of 3 small pills 3 times daily for a total of 9 pills.  He also told me that he is at risk for depression.  He says in the past he has had significant depression he is on antidepressant.  He is worried this can come back with his ILD.  We talked about seeing Elias Else clinical psychologist who specializes in medical issues he is willing to see him.  Also discussed about doing light therapy  Preoperative evaluation: He has aortic stenosis.  He is saying now that it could be critical.  Apparently TAVR procedure is being planned date unknown.  Currently because of the cold he has suspended pulmonary rehabilitation he wants to know if he can go back.  I did tell him we need to get clearance from the cardiologist because of critical aortic stenosis history I thought it was moderate in the past.       SYMPTOM SCALE - ILD 01/24/2021 02/23/2021   04/05/2021 Post hopsilaization. On pred taper and off ofev Ofev 150 twice daily; No  prednsone  05/19/2021 214# 11/17/2021   Current weight         esbiret  O2 use 2L [place] with ex 2L with ex 2L Wth ex at hoe 2L    Shortness of Breath 0 -> 5 scale with 5 being worst (score 6 If unable to do)         At rest 0 0 0 1 0 0  Simple tasks - showers, clothes change, eating, shaving 1 1 0._0 Household (dishes, doing bed, laundry) _1 Shopping 2 1 0 NA 1 1  Walking level at own pace 5 2 0 1 (with oxygen) 1 2  Walking up Stairs _2 with o2, 6 without o2 2 (with oxygen) 2 3  Total (30-36) Dyspnea Score _3 How bad is your cough? 2 3 0 with gbapentin 1-2 (on gabapentin) 2 3  How bad is your fatigue 1 2 0 _4 How bad is nausea 00000 0 0 0 0 0  How bad is vomiting?   0 0 0 0 0 0  How bad is diarrhea? 0 0 0 0 0 0  How bad is anxiety? 0 0 0 0 0 2  How bad is depression 0 0 0 0 (on antidepressant) 1 2  Any chronic pain - if so where and how bad 0 0 0 0 0      Simple office walk 185 feet x  3 laps goal with forehead probe 01/24/2021  04/05/2021 On prendisone. Off ofev 05/19/2021 Off ofev 09/08/2021 On esbiret since may 2023  O2 used Ra at rest, desat at 1 lap and then walked with 2L piulse Ra - being on room air for 15 min and in mddile of pred burst taper    Number laps completed 1 lap on RA, then 3 laps on 2L Did all 3 laps  Comments about pace x avg  avg  Resting Pulse Ox/HR 96% and 88/min at 1 lap 98% an Hr 92 98% AND HR 81 98% and HR 77  Final Pulse Ox/HR 92% and 115/min on 3 laps on 2L pulsed 92% and HR 117 91% and HR 115 92% and HR 113  Desaturated </= 88% no no no no  Desaturated <= 3% points yes Yes, 6  Yes 7  Yes 6 pots  Got Tachycardic >/= 90/min yes yes yes avg  Symptoms at end of test Modreate dyspnea Modate dyspnea Mild dyspnea Mild dyspnea  Miscellaneous comments Corrected with 2L [place] Did not need o2, improved          PFT     Latest Ref Rng & Units 11/17/2021    1:02 PM 05/19/2021   10:56 AM 02/21/2021    4:04 PM  PFT Results   FVC-Pre L 1.94  P 1.95  1.82   FVC-Predicted Pre % 46  P 46  43   FVC-Post L   1.95   FVC-Predicted Post %   46   Pre FEV1/FVC % % 88  P 90  88   Post FEV1/FCV % %   91   FEV1-Pre L 1.71  P 1.75  1.60   FEV1-Predicted Pre % 56  P 56  51   FEV1-Post L   1.76   DLCO uncorrected ml/min/mmHg 16.63  P 19.56  15.57   DLCO UNC% % 67  P 79  63   DLCO corrected ml/min/mmHg 16.87  P 19.56  15.57   DLCO COR %Predicted % 68  P 79  63   DLVA Predicted % 106  P 116  106   TLC L   3.76   TLC % Predicted %   56   RV % Predicted %   75     P Preliminary result       has a past medical history of Aortic stenosis, Flu, Pneumonia, Prostate cancer (HCC), Pulmonary fibrosis ([place]), and Skin cancer.   reports that he has never smoked. He has been exposed to tobacco smoke. He has never used smokeless tobacco.  Past Surgical History:  Procedure Laterality Date   HERNIA REPAIR     NASAL SEPTUM SURGERY     SKIN CANCER EXCISION      Allergies  Allergen Reactions   Bee Venom Rash   Shellfish Allergy Hives   Nintedanib Other (See Comments)    SIRS, fevers   Pseudoephedrine Anxiety    Immunization History  Administered Date(s) Administered   Fluad Quad(high Dose 65+) 10/23/2017, 09/30/2019, 10/14/2020, 10/18/2020   Influenza-Unspecified 11/11/2009, 11/08/2011, 11/03/2013, 11/22/2014, 11/10/2015   Moderna Covid-19 Vaccine Bivalent Booster 37yr & up 10/24/2020   Moderna Sars-Covid-2 Vaccination 03/19/2019, 04/16/2019, 11/16/2019, 05/07/2020   Pneumococcal Conjugate-13 12/08/2007, 12/21/2015, 09/04/2018   Pneumococcal Polysaccharide-23 12/08/2007, 06/21/2017, 06/16/2018   Tdap 12/08/2007, 11/27/2018   Zoster Recombinat (Shingrix) 10/30/2013, 09/04/2018, 11/27/2018   Zoster, Live 10/30/2013   Zoster, Unspecified 10/30/2013    Family History  Problem Relation Age of Onset   Cancer Mother    Congestive Heart Failure Father    Prostate cancer Father    Healthy Sister      Current  Outpatient Medications:    acetaminophen (TYLENOL) 325 MG tablet, Take by mouth as needed., Disp: , Rfl:    aspirin-acetaminophen-caffeine (EXCEDRIN MIGRAINE) 250-250-65 MG tablet, Take by mouth as needed., Disp: , Rfl:    Calcium Carb-Cholecalciferol (CALCIUM/VITAMIN D PO), Take 1 tablet  by mouth daily., Disp: , Rfl:    Cetirizine HCl (ZYRTEC PO), Take by mouth., Disp: , Rfl:    fluorouracil (EFUDEX) 5 % cream, Apply topically., Disp: , Rfl:    fluticasone (FLONASE) 50 MCG/ACT nasal spray, Place into both nostrils daily., Disp: , Rfl:    gabapentin (NEURONTIN) 300 MG capsule, Take 300 mg by mouth 3 (three) times daily., Disp: , Rfl:    loperamide (IMODIUM) 2 MG capsule, Take 1 mg by mouth once., Disp: , Rfl:    meclizine (ANTIVERT) 25 MG tablet, Take 25 mg by mouth 2 (two) times daily as needed., Disp: , Rfl:    Multiple Vitamin (MULTIVITAMIN) capsule, Take 1 capsule by mouth every morning., Disp: , Rfl:    naproxen sodium (ALEVE) 220 MG tablet, Take 220 mg by mouth daily as needed., Disp: , Rfl:    omeprazole (PRILOSEC) 40 MG capsule, Take 40 mg by mouth daily., Disp: , Rfl:    PAROXETINE HCL PO, Take by mouth., Disp: , Rfl:    Pirfenidone (ESBRIET) 267 MG TABS, Take 3 tablets (801 mg total) by mouth 3 (three) times daily with meals., Disp: 270 tablet, Rfl: 4   rosuvastatin (CRESTOR) 5 MG tablet, Take 1 tablet by mouth daily., Disp: , Rfl:    SUMAtriptan (IMITREX) 100 MG tablet, TAKE ONE TABLET BY MOUTH AT ONSET OF HEADACHE; MAY REPEAT ONE TABLET IN 2 HOURS IF NEEDED. MAX OF 200 MG PER DAY, Disp: , Rfl:       Objective:   Vitals:   11/17/21 1353  BP: 118/70  Pulse: 100  Temp: 98.6 F (37 C)  TempSrc: Oral  SpO2: 97%  Weight: 210 lb 12.8 oz (95.6 kg)  Height: _0  (1.727 m)    Estimated body mass index is 32.05 kg/m as calculated from the following:   Height as of this encounter: _1  (1.727 m).   Weight as of this encounter: 210 lb 12.8 oz (95.6  kg).  _2 @  Filed Weights   11/17/21 1353  Weight: 210 lb 12.8 oz (95.6 kg)     Physical Exam  General: No distress. Looks well. Stuff nose Neuro: Alert and Oriented x 3. GCS 15. Speech normal Psych: Pleasant Resp:  Barrel Chest - no.  Wheeze - no, Crackles - yes bilater, No overt respiratory distress CVS: Normal heart sounds. Murmurs - nono Ext: Stigmata of Connective Tissue Disease - no HEENT: Normal upper airway. PEERL +. No post nasal drip        Assessment:       ICD-10-CM   1. ILD (interstitial lung disease) (HCC)  J84.9 Hepatic function panel    Pulmonary function test    2. Rheumatoid factor positive  R76.8     3. Medication monitoring encounter  Z51.81     4. Encounter for medication counseling  Z71.89     5. Preop respiratory exam  Z01.811     6. Obesity (BMI 30.0-34.9)  E66.9     7. 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 Memory change  R41.3     10. Itchy scalp  L29.9     11. Anxiety and depression  F41.9 Ambulatory referral to Psychology   F32.A          Plan:     Patient Instructions  ILD (interstitial lung disease) ([place]) -high suspect for chronic hypersensitive pneumonitis Pulmonary air trapping Chronic respiratory failure with hypoxia (  [place]) Rheumatoid factor positive Encounter for therapeutic drug monitoring  - - PFT stable feb 2023 -> May 2023 -> symptoms stable through August 2023 -> oct 2023 -Glad you are tolerating pirfenidone really well   Plan (shared decision making) -- Check LFT 11/17/2021 -Continue pirfenidone per protocol  -Pharmacy to consider rolling into 1 big pill 3 times daily  - take with food  - space 5-6 hours apart  - apply sunscreen  - wear hat  - hydrate well - discard paper work for [employer] - you are not on ofev -Do spirometry and DLCO and3-4 months -Holding off on rheumatology evaluation for now -Okay to do judicious gardening as long as there is no  exposure to organic dust andwear N95 mask  Runny nose and cold Decreased memory   -Some of the problems of fatigue runny nose and low energy and being forgetful could be related to current cold for the last 4 days.  Plan - Call our office including the hotline if symptoms are getting worse particularly if there is fever or change in color of sputum or cough or wheezing and we can consider antibiotic and steroids  - If memory issues continue to persist please talk to primary care physician  Anxiety/depression  -Currently very mild but understand you are at risk  [place] clinical psychologist at [facility] -Consider white light therapy during the winter.  Itchy scalpe  -Glad you are going to see a dermatologist - This could be from pirfenidone because you are not wearing a hat  Plan - Apply sunscreen to scalp and also wear a hat  Chronic cough  Noticed that gabapentin is really helping you  Plan - Okay to continue gabapentin   Aortic Stenosis - moderate Preoperative respiratory evaluation  Plan  - CMA to call your cardiologist and make sure it is okay to restart pulmonary rehabilitation ahead of aortic stenosis surgery  -Obesity  BMI 32  Plan - Please talk to primary care physician about weight loss drugs  Follow-up - Do spirometry and DLCO in 12-14 weeks - FAce to face  see Dr. Chase Dean in 12-14  weeks but after pulmonary function test  -30-minute visit, simple walking desaturation test and symptom score at follow-up  High complex medical condition requiring high risk prescription with intensive therapeutic monitoring requirement    SIGNATURE    Dr. Brand Dean, M.D., F.C.C.P,  Pulmonary and Critical Care Medicine Staff Physician, [place] Director - Interstitial Lung Disease  Program  Pulmonary [place] at [place], [place], [address]  Pager: [phone], If no  answer or between  15:00h - 7:00h: call [phone] Telephone: [phone]  2:45 PM 11/17/2021

## 2021-11-17 NOTE — Progress Notes (Signed)
Spirometry and DLCO Performed Today.  

## 2021-11-17 NOTE — Patient Instructions (Addendum)
ILD (interstitial lung disease) (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 Pulmonary air trapping Chronic respiratory failure with hypoxia (HCC) Rheumatoid factor positive Encounter for therapeutic drug monitoring  - - PFT stable feb 2023 -> May 2023 -> symptoms stable through August 2023 -> oct 2023 -Glad you are tolerating pirfenidone really well   Plan (shared decision making) -- Check LFT 11/17/2021 -Continue pirfenidone per protocol  -Pharmacy to consider rolling into 1 big pill 3 times daily  - take with food  - space 5-6 hours apart  - apply sunscreen  - wear hat  - hydrate well - discard paper work for [employer] - you are not on ofev -Do spirometry and DLCO and3-4 months -Holding off on rheumatology evaluation for now -Okay to do judicious gardening as long as there is no exposure to organic dust andwear N95 mask  Runny nose and cold Decreased memory   -Some of the problems of fatigue runny nose and low energy and being forgetful could be related to current cold for the last 4 days.  Plan - Call our office including the hotline if symptoms are getting worse particularly if there is fever or change in color of sputum or cough or wheezing and we can consider antibiotic and steroids  - If memory issues continue to persist please talk to primary care physician  Anxiety/depression  -Currently very mild but understand you are at risk  [place] clinical psychologist at [facility] -Consider white light therapy during the winter.  Itchy scalpe  -Glad you are going to see a dermatologist - This could be from pirfenidone because you are not wearing a hat  Plan - Apply sunscreen to scalp and also wear a hat  Chronic cough  Noticed that gabapentin is really helping you  Plan - Okay to continue gabapentin   Aortic Stenosis - moderate Preoperative respiratory evaluation  Plan  - CMA to call your cardiologist and make sure it is  okay to restart pulmonary rehabilitation ahead of aortic stenosis surgery  -Obesity  BMI 32  Plan - Please talk to primary care physician about weight loss drugs  Follow-up - Do spirometry and DLCO in 12-14 weeks - FAce to face  see Dr. Chase Caller in 12-14  weeks but after pulmonary function test  -30-minute visit, simple walking desaturation test and symptom score at follow-up

## 2021-11-17 NOTE — Patient Instructions (Signed)
Spirometry and DLCO Performed Today.  

## 2021-11-20 ENCOUNTER — Encounter: Payer: Self-pay | Admitting: Internal Medicine

## 2021-11-20 NOTE — Telephone Encounter (Signed)
I have texted his cardiologist -> but not heard back . So, til then hold off rehab esp wit aortic stenosis  Re his cold - continue to keep an eye and keep [place] posted. I am NA end of week  Pls send message back

## 2021-11-21 ENCOUNTER — Telehealth (HOSPITAL_COMMUNITY): Payer: Self-pay

## 2021-11-21 ENCOUNTER — Telehealth (HOSPITAL_COMMUNITY): Payer: Self-pay | Admitting: Family Medicine

## 2021-11-21 ENCOUNTER — Encounter (HOSPITAL_COMMUNITY): Payer: Medicare Other

## 2021-11-21 NOTE — Telephone Encounter (Signed)
Dw Dr Radford Pax his cardiologist late yesterday when he called back - moderate AS -> ok to restart pulm rehab. Let patient know that is ok when cold better

## 2021-11-21 NOTE — Telephone Encounter (Signed)
Called Dustin Dean to check on him. He is feeling better. He has approval from cardiology and pulmonology to come back next Tuesday if he is feeling better.

## 2021-11-23 ENCOUNTER — Encounter (HOSPITAL_COMMUNITY): Payer: Medicare Other

## 2021-11-27 ENCOUNTER — Encounter: Payer: Self-pay | Admitting: Internal Medicine

## 2021-11-27 MED ORDER — PREDNISONE 10 MG PO TABS: ORAL_TABLET | ORAL | 0 refills | Status: AC

## 2021-11-27 MED ORDER — DOXYCYCLINE HYCLATE 100 MG PO TABS: 100.0000 mg | ORAL_TABLET | Freq: Two times a day (BID) | ORAL | 0 refills | Status: DC

## 2021-11-27 NOTE — Telephone Encounter (Signed)
Received the following message from patient:   Dr. Chase Caller,   I know you and your staff are busy so I'll try to keep it simple. Please call me at [phone] if needed.   I still feel less than 100%. I have a sense that the virus is gone and I'm not running a fever, but I have no energy, a slight headache, and a bit of a sore throat.   My nose has been running almost continuously and when I blow it I get huge amounts of nose mucous. It's challenging to wear oxygen because of the need to pull the cannula aside often to blow my nose or wipe it. Adding a mask puts me in horrible misery so I have been staying home.   I feel like my meds for treating my nasal condition should be evaluated to see if there's anything stronger. I take ceterizine and flonase.    Looking forward to hearing something positive.   Dustin Dean  MR, can you please advise? Thanks!

## 2021-11-27 NOTE — Telephone Encounter (Signed)
Spoke to Dustin Dean. Having sore throat and drainage yellow and still runny nose. No fever  Plan  - Take doxycycline '100mg'$  po twice daily x 7 days; take after meals and avoid sunlight  - Take prednisone 40 mg daily x 2 days, then '20mg'$  daily x 2 days, then '10mg'$  daily x 2 days, then '5mg'$  daily x 2 days and stop   - call back if any issues   Allergies  Allergen Reactions   Bee Venom Rash   Shellfish Allergy Hives   Nintedanib Other (See Comments)    SIRS, fevers   Pseudoephedrine Anxiety

## 2021-11-28 ENCOUNTER — Telehealth (HOSPITAL_COMMUNITY): Payer: Self-pay | Admitting: *Deleted

## 2021-11-28 ENCOUNTER — Encounter (HOSPITAL_COMMUNITY): Payer: Medicare Other

## 2021-11-28 NOTE — Telephone Encounter (Signed)
Dustin Dean called to inform that he is still struggling with his sinuses and has been unable to exercise. 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 He would like to d/c from [facility] at this time. Discussed that he will be appropriate for Cardiac Rehab Outpt after his TAVR or he could come back to [facility].  Yves Dill BS, ACSM-CEP 11/28/2021 4:42 PM

## 2021-11-30 ENCOUNTER — Encounter (HOSPITAL_COMMUNITY): Payer: Medicare Other

## 2021-11-30 NOTE — Progress Notes (Signed)
Discharge Progress Report  Patient Details  Name: Dustin Dean MRN: [id number] Date of Birth: 05-Jul-1950 Referring Provider:   April Manson Pulmonary Rehab Walk Test from 10/02/2021 in [facility] [place]  Referring Provider Ramaswamy        Number of Visits: 7  Reason for Discharge:  Early Exit:  Discharged  due to need for TAVR surgery.  Smoking History:  Social History   Tobacco Use  Smoking Status Never   Passive exposure: Past  Smokeless Tobacco Never    Diagnosis:  ILD (interstitial lung disease) ([place])  ADL UCSD:  Pulmonary Assessment Scores     Row Name 10/02/21 1119         ADL UCSD   ADL Phase Entry     SOB Score total 40       CAT Score   CAT Score 16       mMRC Score   mMRC Score 2              Initial Exercise Prescription:  Initial Exercise Prescription - 10/02/21 1200       Date of Initial Exercise RX and Referring Provider   Date 10/02/21    Referring Provider Ramaswamy    Expected Discharge Date 12/05/21      Oxygen   Oxygen Continuous    Liters 2    Maintain Oxygen Saturation 88% or higher      Recumbant Bike   Level 2    RPM 60    Minutes 15      NuStep   Level 2    SPM 60    Minutes 15      Prescription Details   Frequency (times per week) 2    Duration Progress to 30 minutes of continuous aerobic without signs/symptoms of physical distress      Intensity   THRR 40-80% of Max Heartrate 60-121    Ratings of Perceived Exertion 11-13    Perceived Dyspnea 0-4      Progression   Progression Continue to progress workloads to maintain intensity without signs/symptoms of physical distress.      Resistance Training   Training Prescription Yes    Weight blue bands    Reps 10-15             Discharge Exercise Prescription (Final Exercise Prescription Changes):  Exercise Prescription Changes - 11/07/21 1600       Response to Exercise   Blood Pressure  (Admit) 114/80    Blood Pressure (Exit) 128/76    Heart Rate (Admit) 92 bpm    Heart Rate (Exercise) 123 bpm    Heart Rate (Exit) 108 bpm    Oxygen Saturation (Admit) 97 %    Oxygen Saturation (Exercise) 95 %    Oxygen Saturation (Exit) 95 %    Rating of Perceived Exertion (Exercise) 13    Perceived Dyspnea (Exercise) 2    Duration Continue with 30 min of aerobic exercise without signs/symptoms of physical distress.    Intensity THRR unchanged      Progression   Progression Continue to progress workloads to maintain intensity without signs/symptoms of physical distress.      Resistance Training   Training Prescription Yes    Weight blue bands    Reps 10-15    Time 10 Minutes      Oxygen   Oxygen Continuous    Liters 2      Treadmill   MPH 1.8  Grade 0    Minutes 15      NuStep   Level 4    SPM 80    Minutes 15    METs 2.3             Functional Capacity:  6 Minute Walk     Row Name 10/02/21 1223         6 Minute Walk   Phase Initial     Distance 1175 feet     Walk Time 6 minutes     # of Rest Breaks 0     MPH 2.23     METS 2.68     RPE 12     Perceived Dyspnea  1     VO2 Peak 9.37     Symptoms No     Resting HR 82 bpm     Resting BP 112/78     Resting Oxygen Saturation  97 %     Exercise Oxygen Saturation  during 6 min walk 92 %     Max Ex. HR 108 bpm     Max Ex. BP 138/76     2 Minute Post BP 130/76       Interval HR   1 Minute HR 106     2 Minute HR 108     3 Minute HR 108     4 Minute HR 108     5 Minute HR 108     6 Minute HR 107     2 Minute Post HR 77     Interval Heart Rate? Yes       Interval Oxygen   Interval Oxygen? Yes     Baseline Oxygen Saturation % 97 %     1 Minute Oxygen Saturation % 94 %     1 Minute Liters of Oxygen 2 L     2 Minute Oxygen Saturation % 92 %     2 Minute Liters of Oxygen 2 L     3 Minute Oxygen Saturation % 93 %     3 Minute Liters of Oxygen 2 L     4 Minute Oxygen Saturation % 92 %     4  Minute Liters of Oxygen 2 L     5 Minute Oxygen Saturation % 94 %     5 Minute Liters of Oxygen 2 L     6 Minute Oxygen Saturation % 96 %     6 Minute Liters of Oxygen 2 L     2 Minute Post Oxygen Saturation % 96 %     2 Minute Post Liters of Oxygen 2 L              Psychological, QOL, Others - Outcomes: PHQ 2/9:    10/02/2021   11:01 AM 10/02/2021   11:00 AM  Depression screen PHQ 2/9  Decreased Interest 0 0  Down, Depressed, Hopeless 0 0  PHQ - 2 Score 0 0  Altered sleeping 0   Tired, decreased energy 0   Change in appetite 0   Feeling bad or failure about yourself  0   Trouble concentrating 0   Moving slowly or fidgety/restless 0   Suicidal thoughts 0   PHQ-9 Score 0   Difficult doing work/chores Not difficult at all     Quality of Life:   Personal Goals: Goals established at orientation with interventions provided to work toward goal.  Personal Goals and Risk Factors at Admission - 10/02/21 1110  Core Components/Risk Factors/Patient Goals on Admission    Weight Management Weight Loss    Improve shortness of breath with ADL's Yes    Intervention Provide education, individualized exercise plan and daily activity instruction to help decrease symptoms of SOB with activities of daily living.    Expected Outcomes Short Term: Improve cardiorespiratory fitness to achieve a reduction of symptoms when performing ADLs;Long Term: Be able to perform more ADLs without symptoms or delay the onset of symptoms              Personal Goals Discharge:  Goals and Risk Factor Review     Row Name 10/11/21 1125 11/07/21 1810           Core Components/Risk Factors/Patient Goals Review   Personal Goals Review Weight Management/Obesity;Improve shortness of breath with ADL's;Develop more efficient breathing techniques such as purse lipped breathing and diaphragmatic breathing and practicing self-pacing with activity.;Increase knowledge of respiratory medications and ability  to use respiratory devices properly. Weight Management/Obesity;Improve shortness of breath with ADL's;Develop more efficient breathing techniques such as purse lipped breathing and diaphragmatic breathing and practicing self-pacing with activity.;Increase knowledge of respiratory medications and ability to use respiratory devices properly.      Review Andrej has just started the program and attended one session. We will continue to monitor his weight and other core components as he progress through the program. Micheal has had a slight weight drop since attending the program. He has had a consult with the dietician. He is exercisng in the nustep,track and treadmill today. He has had inceasing workloads and METS. He is motivate to exercise and improve. He reports mild to mild with dificulty SOB and his oxygen saturations have been 91-96% on 2 liters of oxygen with exercise.      Expected Outcomes See admission goals. See admission goals.               Exercise Goals and Review:  Exercise Goals     Row Name 10/02/21 1238 10/09/21 1003 11/02/21 1555         Exercise Goals   Increase Physical Activity Yes Yes Yes     Intervention Provide advice, education, support and counseling about physical activity/exercise needs.;Develop an individualized exercise prescription for aerobic and resistive training based on initial evaluation findings, risk stratification, comorbidities and participant's personal goals. Provide advice, education, support and counseling about physical activity/exercise needs.;Develop an individualized exercise prescription for aerobic and resistive training based on initial evaluation findings, risk stratification, comorbidities and participant's personal goals. Provide advice, education, support and counseling about physical activity/exercise needs.;Develop an individualized exercise prescription for aerobic and resistive training based on initial evaluation findings, risk  stratification, comorbidities and participant's personal goals.     Expected Outcomes Short Term: Attend rehab on a regular basis to increase amount of physical activity.;Long Term: Add in home exercise to make exercise part of routine and to increase amount of physical activity.;Long Term: Exercising regularly at least 3-5 days a week. Short Term: Attend rehab on a regular basis to increase amount of physical activity.;Long Term: Add in home exercise to make exercise part of routine and to increase amount of physical activity.;Long Term: Exercising regularly at least 3-5 days a week. Short Term: Attend rehab on a regular basis to increase amount of physical activity.;Long Term: Add in home exercise to make exercise part of routine and to increase amount of physical activity.;Long Term: Exercising regularly at least 3-5 days a week.     Increase Strength and  Stamina Yes Yes Yes     Intervention Provide advice, education, support and counseling about physical activity/exercise needs.;Develop an individualized exercise prescription for aerobic and resistive training based on initial evaluation findings, risk stratification, comorbidities and participant's personal goals. Provide advice, education, support and counseling about physical activity/exercise needs.;Develop an individualized exercise prescription for aerobic and resistive training based on initial evaluation findings, risk stratification, comorbidities and participant's personal goals. Provide advice, education, support and counseling about physical activity/exercise needs.;Develop an individualized exercise prescription for aerobic and resistive training based on initial evaluation findings, risk stratification, comorbidities and participant's personal goals.     Expected Outcomes Short Term: Increase workloads from initial exercise prescription for resistance, speed, and METs.;Short Term: Perform resistance training exercises routinely during rehab and  add in resistance training at home;Long Term: Improve cardiorespiratory fitness, muscular endurance and strength as measured by increased METs and functional capacity (6MWT) Short Term: Increase workloads from initial exercise prescription for resistance, speed, and METs.;Short Term: Perform resistance training exercises routinely during rehab and add in resistance training at home;Long Term: Improve cardiorespiratory fitness, muscular endurance and strength as measured by increased METs and functional capacity (6MWT) Short Term: Increase workloads from initial exercise prescription for resistance, speed, and METs.;Short Term: Perform resistance training exercises routinely during rehab and add in resistance training at home;Long Term: Improve cardiorespiratory fitness, muscular endurance and strength as measured by increased METs and functional capacity (6MWT)     Able to understand and use rate of perceived exertion (RPE) scale Yes Yes Yes     Intervention Provide education and explanation on how to use RPE scale Provide education and explanation on how to use RPE scale Provide education and explanation on how to use RPE scale     Expected Outcomes Short Term: Able to use RPE daily in rehab to express subjective intensity level;Long Term:  Able to use RPE to guide intensity level when exercising independently Short Term: Able to use RPE daily in rehab to express subjective intensity level;Long Term:  Able to use RPE to guide intensity level when exercising independently Short Term: Able to use RPE daily in rehab to express subjective intensity level;Long Term:  Able to use RPE to guide intensity level when exercising independently     Able to understand and use Dyspnea scale Yes Yes Yes     Intervention Provide education and explanation on how to use Dyspnea scale Provide education and explanation on how to use Dyspnea scale Provide education and explanation on how to use Dyspnea scale     Expected Outcomes  Short Term: Able to use Dyspnea scale daily in rehab to express subjective sense of shortness of breath during exertion;Long Term: Able to use Dyspnea scale to guide intensity level when exercising independently Short Term: Able to use Dyspnea scale daily in rehab to express subjective sense of shortness of breath during exertion;Long Term: Able to use Dyspnea scale to guide intensity level when exercising independently Short Term: Able to use Dyspnea scale daily in rehab to express subjective sense of shortness of breath during exertion;Long Term: Able to use Dyspnea scale to guide intensity level when exercising independently     Knowledge and understanding of Target Heart Rate Range (THRR) Yes Yes Yes     Intervention Provide education and explanation of THRR including how the numbers were predicted and where they are located for reference Provide education and explanation of THRR including how the numbers were predicted and where they are located for reference Provide education  and explanation of THRR including how the numbers were predicted and where they are located for reference     Expected Outcomes Short Term: Able to state/look up THRR;Long Term: Able to use THRR to govern intensity when exercising independently;Short Term: Able to use daily as guideline for intensity in rehab Short Term: Able to state/look up THRR;Long Term: Able to use THRR to govern intensity when exercising independently;Short Term: Able to use daily as guideline for intensity in rehab Short Term: Able to state/look up THRR;Long Term: Able to use THRR to govern intensity when exercising independently;Short Term: Able to use daily as guideline for intensity in rehab     Understanding of Exercise Prescription Yes Yes Yes     Intervention Provide education, explanation, and written materials on patient's individual exercise prescription Provide education, explanation, and written materials on patient's individual exercise  prescription Provide education, explanation, and written materials on patient's individual exercise prescription     Expected Outcomes Short Term: Able to explain program exercise prescription;Long Term: Able to explain home exercise prescription to exercise independently Short Term: Able to explain program exercise prescription;Long Term: Able to explain home exercise prescription to exercise independently Short Term: Able to explain program exercise prescription;Long Term: Able to explain home exercise prescription to exercise independently              Exercise Goals Re-Evaluation:  Exercise Goals Re-Evaluation     [place] Name 10/09/21 1003 11/02/21 1556           Exercise Goal Re-Evaluation   Exercise Goals Review Increase Physical Activity;Increase Strength and Stamina;Able to understand and use rate of perceived exertion (RPE) scale;Able to understand and use Dyspnea scale;Knowledge and understanding of Target Heart Rate Range (THRR);Understanding of Exercise Prescription Increase Physical Activity;Increase Strength and Stamina;Able to understand and use rate of perceived exertion (RPE) scale;Able to understand and use Dyspnea scale;Knowledge and understanding of Target Heart Rate Range (THRR);Understanding of Exercise Prescription      Comments Pt is to begin exercise this week. Will monitor and progress as tolerated. Pt has attended 6 sessions and has tolerated well. He missed two classes for medical testing.  He is exercising on the nustep at level 4 for 15 min, METs 2.1. Then he walks the track for 15 min, 13 laps for 2.51 METs.  He is progressing appropriately without sx. Will monitor and progress as tolerated.      Expected Outcomes Through exercise at rehab and home, the patient will decrease shortness of breath with daily activities and feel confident in carrying out an exercise regimen at home. Through exercise at rehab and home, the patient will decrease shortness of breath with  daily activities and feel confident in carrying out an exercise regimen at home.               Nutrition & Weight - Outcomes:  Pre Biometrics - 10/02/21 1238       Pre Biometrics   Grip Strength 24 kg              Nutrition:  Nutrition Therapy & Goals - 11/02/21 1501       Nutrition Therapy   Diet Heart Healthy Diet    Drug/Food Interactions Statins/Certain Fruits      Personal Nutrition Goals   Nutrition Goal Patient to use the plate method as a daily guide for meal planning to include lean protein/plant protein, fruit, vegetables, whole grains, nonfat dairy as part of heart healthy lifestyle    Personal  Goal #2 Patient to identify strategies for weight loss of 0.5-2.0# per week.    Personal Goal #3 Patient to identify food sources and limit daily intake of saturated fat, trans fat, sodium, and refined carbohydrates    Comments Yvone Neu has not lost weight since starting with our program.  He reports making some changes such as reducing sweets/refined carbohydrates at night to aid with weight loss. We further discussed strategies for weight loss including using the plate method as a guide for meal planning, decreasing peanut butter intake/portion, increasing non-starchy vegetables, etc. Further discussed high protein/high fiber snacks, reading food labels for sugar, etc. Reviewed the importance for sodium reduction and benefits of high fiber intake for heart health. He does report good support from his wife.      Intervention Plan   Intervention Prescribe, educate and counsel regarding individualized specific dietary modifications aiming towards targeted core components such as weight, hypertension, lipid management, diabetes, heart failure and other comorbidities.;Nutrition handout(s) given to patient.    Expected Outcomes Short Term Goal: Understand basic principles of dietary content, such as calories, fat, sodium, cholesterol and nutrients.;Long Term Goal: Adherence to  prescribed nutrition plan.;Short Term Goal: A plan has been developed with personal nutrition goals set during dietitian appointment.             Nutrition Discharge:  Nutrition Assessments - 10/12/21 0813       Rate Your Plate Scores   Pre Score 73             Education Questionnaire Score:  Knowledge Questionnaire Score - 10/02/21 1106       Knowledge Questionnaire Score   Pre Score 13/18             Goals reviewed with patient; copy given to patient.

## 2021-11-30 NOTE — Addendum Note (Signed)
Encounter addended by: Deon Pilling, RN on: 11/30/2021 3:19 PM  Actions taken: Clinical Note Signed

## 2021-12-05 ENCOUNTER — Encounter (HOSPITAL_COMMUNITY): Payer: Medicare Other

## 2021-12-12 ENCOUNTER — Encounter (HOSPITAL_COMMUNITY): Payer: Medicare Other

## 2021-12-13 ENCOUNTER — Encounter: Payer: Self-pay | Admitting: Internal Medicine

## 2021-12-13 DIAGNOSIS — R053 Chronic cough: Secondary | ICD-10-CM

## 2021-12-13 DIAGNOSIS — J3489 Other specified disorders of nose and nasal sinuses: Secondary | ICD-10-CM

## 2021-12-14 ENCOUNTER — Encounter (HOSPITAL_COMMUNITY): Payer: Medicare Other

## 2021-12-14 NOTE — Telephone Encounter (Signed)
MR, please advise on pt's message regarding congestion and ENT provider. Thanks.

## 2021-12-14 NOTE — Telephone Encounter (Signed)
He can see Dr. Lorelee Cover in [place] or anybody at [facility] ENT  -Dr. Redmond Baseman or Dr. Wilburn Cornelia or Dr. Wellington Hampshire.  We can also get CT scan of the sinus without contrast and also blood RAST allergy panel with blood IgE with CBC with differential to get the workup going in case there is any allergies or blockages

## 2021-12-19 ENCOUNTER — Encounter (HOSPITAL_COMMUNITY): Payer: Medicare Other

## 2021-12-19 NOTE — Telephone Encounter (Signed)
MR, please advise on pt's message regarding the CT scan. Pt states he is having one with his ENT and was questioning if he still needs the one you ordered. Thanks.

## 2021-12-20 ENCOUNTER — Other Ambulatory Visit (INDEPENDENT_AMBULATORY_CARE_PROVIDER_SITE_OTHER): Payer: Medicare Other

## 2021-12-20 ENCOUNTER — Ambulatory Visit (INDEPENDENT_AMBULATORY_CARE_PROVIDER_SITE_OTHER): Payer: Medicare Other | Admitting: Psychologist

## 2021-12-20 DIAGNOSIS — F411 Generalized anxiety disorder: Secondary | ICD-10-CM | POA: Diagnosis not present

## 2021-12-20 DIAGNOSIS — J3489 Other specified disorders of nose and nasal sinuses: Secondary | ICD-10-CM | POA: Diagnosis not present

## 2021-12-20 DIAGNOSIS — F330 Major depressive disorder, recurrent, mild: Secondary | ICD-10-CM | POA: Diagnosis not present

## 2021-12-20 DIAGNOSIS — R053 Chronic cough: Secondary | ICD-10-CM | POA: Diagnosis not present

## 2021-12-20 LAB — CBC WITH DIFFERENTIAL/PLATELET
Basophils Absolute: 0 10*3/uL (ref 0.0–0.1)
Basophils Relative: 0.3 % (ref 0.0–3.0)
Eosinophils Absolute: 0.6 10*3/uL (ref 0.0–0.7)
Eosinophils Relative: 6.2 % — ABNORMAL HIGH (ref 0.0–5.0)
HCT: 43 % (ref 39.0–52.0)
Hemoglobin: 14.2 g/dL (ref 13.0–17.0)
Lymphocytes Relative: 13.1 % (ref 12.0–46.0)
Lymphs Abs: 1.2 10*3/uL (ref 0.7–4.0)
MCHC: 33.1 g/dL (ref 30.0–36.0)
MCV: 88 fl (ref 78.0–100.0)
Monocytes Absolute: 0.8 10*3/uL (ref 0.1–1.0)
Monocytes Relative: 8.9 % (ref 3.0–12.0)
Neutro Abs: 6.4 10*3/uL (ref 1.4–7.7)
Neutrophils Relative %: 71.5 % (ref 43.0–77.0)
Platelets: 153 10*3/uL (ref 150.0–400.0)
RBC: 4.89 Mil/uL (ref 4.22–5.81)
RDW: 17.2 % — ABNORMAL HIGH (ref 11.5–15.5)
WBC: 9 10*3/uL (ref 4.0–10.5)

## 2021-12-20 NOTE — Progress Notes (Signed)
                Derwin Reddy, PsyD 

## 2021-12-20 NOTE — Telephone Encounter (Signed)
I ordered CT scan of sinus so it will be easy for Dr Redmond Baseman but if Dr Redmond Baseman going to get one done then he DOES NOT need CT sinus with [place].

## 2021-12-20 NOTE — Progress Notes (Signed)
Dustin Dean Counselor Initial Adult Exam  Name: Dustin Dean Date: 12/20/2021 MRN: [id number] DOB: 1950-03-12 PCP: Joneen Boers, MD  Time spent: 11:06 am to 11:46 am; total time: 40  minutes  This session was held via in person. The patient consented to in-person therapy and was in the clinician's office. Limits of confidentiality were discussed with the patient.    Guardian/Payee:  NA    Paperwork requested: No   Reason for Visit /Presenting Problem: Anxiety and depression secondary to health conditions  Mental Status Exam: Appearance:   Well Groomed     Behavior:  Appropriate  Motor:  Normal  Speech/Language:   Clear and Coherent  Affect:  Appropriate  Mood:  normal  Thought process:  normal  Thought content:    WNL  Sensory/Perceptual disturbances:    WNL  Orientation:  oriented to person, place, and time/date  Attention:  Good  Concentration:  Good  Memory:  WNL  Fund of knowledge:   Good  Insight:    Good  Judgment:   Good  Impulse Control:  Good     Reported Symptoms:  The patient endorsed experiencing the following: feeling down, sad, rumination of thoughts, fatigue, social isolation, and avoiding pleasurable activities. He denied suicidal and homicidal ideation.   The patient endorsed experiencing the following: racing thoughts, feeling on edge, feeling restless, feeling overwhelmed, difficulty controlling worries, and sometimes distressed with rumination of thoughts. He denied suicidal and homicidal ideation.   Risk Assessment: Danger to Self:  No Self-injurious Behavior: No Danger to Others: No Duty to Warn:no Physical Aggression / Violence:No  Access to Firearms a concern: No  Gang Involvement:No  Patient / guardian was educated about steps to take if suicide or homicide risk level increases between visits: n/a While future psychiatric events cannot be accurately predicted, the patient does not currently require acute inpatient psychiatric  care and does not currently meet [facility] involuntary commitment criteria.  Substance Abuse History: Current substance abuse: No     Past Psychiatric History:   Previous psychological history is significant for anxiety and depression Outpatient Providers:NA History of Psych Hospitalization: No  Psychological Testing:  NA    Abuse History:  Victim of: No.,  NA    Report needed: No. Victim of Neglect:No. Perpetrator of  NA   Witness / Exposure to Domestic Violence: No   Protective Services Involvement: No  Witness to [employer] Violence:  No   Family History:  Family History  Problem Relation Age of Onset   Cancer Mother    Congestive Heart Failure Father    Prostate cancer Father    Healthy Sister     Living situation: the patient lives with their spouse  Sexual Orientation: Straight  Relationship Status: married  Name of spouse / other:Dustin Dean. They have been married for 41 years.  If a parent, number of children / ages:Patient has a son whose name is Systems developer. They have two grandsons who are 3 and 54 years old.   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 History of legal  issue / charges:  NA  Medical History/Surgical History: reviewed Past Medical History:  Diagnosis Date   Aortic stenosis    Flu    Pneumonia    Prostate cancer ([place])    Pulmonary fibrosis (HCC)    Skin cancer     Past Surgical History:  Procedure Laterality Date   HERNIA REPAIR      NASAL SEPTUM SURGERY     SKIN CANCER EXCISION      Medications: Current Outpatient Medications  Medication Sig Dispense Refill   acetaminophen (TYLENOL) 325 MG tablet Take by mouth as needed.     aspirin-acetaminophen-caffeine (EXCEDRIN MIGRAINE) 250-250-65 MG tablet Take by mouth as needed.     Calcium Carb-Cholecalciferol (CALCIUM/VITAMIN D PO) Take 1 tablet by mouth daily.     Cetirizine HCl (ZYRTEC PO) Take by mouth.     doxycycline (VIBRA-TABS) 100 MG tablet Take 1 tablet (100 mg total) by mouth 2 (two) times daily. 14 tablet 0   fluorouracil (EFUDEX) 5 % cream Apply topically.     fluticasone (FLONASE) 50 MCG/ACT nasal spray Place into both nostrils daily.     gabapentin (NEURONTIN) 300 MG capsule Take 300 mg by mouth 3 (three) times daily.     loperamide (IMODIUM) 2 MG capsule Take 1 mg by mouth once.     meclizine (ANTIVERT) 25 MG tablet Take 25 mg by mouth 2 (two) times daily as needed.     Multiple Vitamin (MULTIVITAMIN) capsule Take 1 capsule by mouth every morning.     naproxen sodium (ALEVE) 220 MG tablet Take 220 mg by mouth daily as needed.     omeprazole (PRILOSEC) 40 MG capsule Take 40 mg by mouth daily.     PAROXETINE HCL PO Take by mouth.     Pirfenidone (ESBRIET) 267 MG TABS Take 3 tablets (801 mg total) by mouth 3 (three) times daily with meals. 270 tablet 4   rosuvastatin (CRESTOR) 5 MG tablet Take 1 tablet by mouth daily.     SUMAtriptan (IMITREX) 100 MG tablet TAKE ONE TABLET BY MOUTH AT ONSET OF HEADACHE; MAY REPEAT ONE TABLET IN 2 HOURS IF NEEDED. MAX OF 200 MG PER DAY     No current facility-administered medications for this visit.    Allergies  Allergen Reactions   Bee Venom Rash   Shellfish Allergy Hives   Nintedanib Other (See Comments)    SIRS, fevers   Pseudoephedrine Anxiety    Diagnoses: F33.0 major depressive affective disorder, recurrent, mild and F41.1 generalized anxiety disorder    Plan of Care: The patient is a 71 year old Caucasian  male who was referred due to experiencing depression and anxiety secondary to health conditions. The patient lives at home with his wife. The patient meets criteria for a diagnosis of F33.0 major depressive affective disorder, recurrent, mild based off of the following: feeling down, sad, rumination of thoughts, fatigue, social isolation, and avoiding pleasurable activities. He denied suicidal and homicidal ideation. The patient also meets criteria for a diagnosis of F41.1 generalized anxiety disorder based off of the following:  racing thoughts, feeling on edge, feeling restless, feeling overwhelmed, difficulty controlling worries, and sometimes distressed with rumination of thoughts. He denied suicidal and homicidal ideation.   The patient stated that he wants to process challenges he experiences living with health conditions. He also wants to work towards finding a purpose again.   This psychologist makes the recommendation that the patient participate in therapy bi-weekly.   Conception Chancy, PsyD

## 2021-12-21 LAB — RESPIRATORY ALLERGY PROFILE REGION II ~~LOC~~
Allergen, A. alternata, m6: 0.85 kU/L — ABNORMAL HIGH
Allergen, Cedar tree, t12: <0.1 kU/L
Allergen, Comm Silver Birch, t9: <0.1 kU/L
Allergen, Cottonwood, t14: <0.1 kU/L
Allergen, D pternoyssinus,d7: <0.1 kU/L
Allergen, Mouse Urine Protein, e78: <0.1 kU/L
Allergen, Mulberry, t76: <0.1 kU/L
Allergen, Oak,t7: <0.1 kU/L
Allergen, P. notatum, m1: <0.1 kU/L
Aspergillus fumigatus, m3: <0.1 kU/L
Bermuda Grass: <0.1 kU/L
Box Elder IgE: <0.1 kU/L
CLADOSPORIUM HERBARUM (M2) IGE: <0.1 kU/L
COMMON RAGWEED (SHORT) (W1) IGE: <0.1 kU/L
Cat Dander: 0.74 kU/L — ABNORMAL HIGH
Class: 0
Class: 0
Class: 0
Class: 0
Class: 0
Class: 0
Class: 0
Class: 0
Class: 0
Class: 0
Class: 0
Class: 0
Class: 0
Class: 0
Class: 0
Class: 0
Class: 0
Class: 0
Class: 0
Class: 0
Class: 0
Class: 2
Class: 2
Cockroach: <0.1 kU/L
D. farinae: <0.1 kU/L
Dog Dander: 0.19 kU/L — ABNORMAL HIGH
Elm IgE: <0.1 kU/L
IgE (Immunoglobulin E), Serum: 33 kU/L (ref <=114)
Johnson Grass: <0.1 kU/L
Pecan/Hickory Tree IgE: <0.1 kU/L
Rough Pigweed  IgE: <0.1 kU/L
Sheep Sorrel IgE: <0.1 kU/L
Timothy Grass: <0.1 kU/L

## 2021-12-21 LAB — INTERPRETATION:

## 2021-12-22 NOTE — Telephone Encounter (Signed)
Called and spoke with pt and cleared up all the confusion from [employer]. Nothing further needed.

## 2022-01-09 ENCOUNTER — Other Ambulatory Visit: Payer: Self-pay

## 2022-01-09 ENCOUNTER — Emergency Department (HOSPITAL_BASED_OUTPATIENT_CLINIC_OR_DEPARTMENT_OTHER): Payer: Medicare Other

## 2022-01-09 ENCOUNTER — Encounter (HOSPITAL_BASED_OUTPATIENT_CLINIC_OR_DEPARTMENT_OTHER): Payer: Self-pay

## 2022-01-09 ENCOUNTER — Telehealth: Payer: Self-pay | Admitting: Pulmonary Disease

## 2022-01-09 ENCOUNTER — Emergency Department (HOSPITAL_BASED_OUTPATIENT_CLINIC_OR_DEPARTMENT_OTHER)
Admission: EM | Admit: 2022-01-09 | Discharge: 2022-01-10 | Disposition: A | Discharge status: Home / Self Care | Payer: Medicare Other | Attending: Emergency Medicine | Admitting: Emergency Medicine

## 2022-01-09 DIAGNOSIS — U071 COVID-19: Secondary | ICD-10-CM | POA: Diagnosis not present

## 2022-01-09 DIAGNOSIS — Z8546 Personal history of malignant neoplasm of prostate: Secondary | ICD-10-CM | POA: Insufficient documentation

## 2022-01-09 DIAGNOSIS — Z85828 Personal history of other malignant neoplasm of skin: Secondary | ICD-10-CM | POA: Diagnosis not present

## 2022-01-09 DIAGNOSIS — J069 Acute upper respiratory infection, unspecified: Secondary | ICD-10-CM | POA: Insufficient documentation

## 2022-01-09 DIAGNOSIS — R0602 Shortness of breath: Secondary | ICD-10-CM | POA: Diagnosis present

## 2022-01-09 LAB — CBC WITH DIFFERENTIAL/PLATELET
Abs Immature Granulocytes: 0.07 10*3/uL (ref 0.00–0.07)
Basophils Absolute: 0.1 10*3/uL (ref 0.0–0.1)
Basophils Relative: 0 %
Eosinophils Absolute: 0.4 10*3/uL (ref 0.0–0.5)
Eosinophils Relative: 4 %
HCT: 42.7 % (ref 39.0–52.0)
Hemoglobin: 13.8 g/dL (ref 13.0–17.0)
Immature Granulocytes: 1 %
Lymphocytes Relative: 8 %
Lymphs Abs: 0.8 10*3/uL (ref 0.7–4.0)
MCH: 28.5 pg (ref 26.0–34.0)
MCHC: 32.3 g/dL (ref 30.0–36.0)
MCV: 88.2 fL (ref 80.0–100.0)
Monocytes Absolute: 0.7 10*3/uL (ref 0.1–1.0)
Monocytes Relative: 6 %
Neutro Abs: 9.1 10*3/uL — ABNORMAL HIGH (ref 1.7–7.7)
Neutrophils Relative %: 81 %
Platelets: 130 10*3/uL — ABNORMAL LOW (ref 150–400)
RBC: 4.84 MIL/uL (ref 4.22–5.81)
RDW: 15.7 % — ABNORMAL HIGH (ref 11.5–15.5)
WBC: 11.2 10*3/uL — ABNORMAL HIGH (ref 4.0–10.5)
nRBC: 0 % (ref 0.0–0.2)

## 2022-01-09 MED ORDER — ACETAMINOPHEN 500 MG PO TABS: 1000.0000 mg | ORAL_TABLET | Freq: Once | ORAL | Status: DC

## 2022-01-09 MED ORDER — ACETAMINOPHEN 325 MG PO TABS
650.0000 mg | ORAL_TABLET | Freq: Once | ORAL | Status: AC | PRN
  Administered 2022-01-09: 650 mg via ORAL
  Filled 2022-01-09: qty 2

## 2022-01-09 NOTE — Progress Notes (Signed)
Patient with history of pulmonary fibrosis and recent heart valve surgery called in with complaints of new onset Covid infection, and breathing 40 times per minute. I advised him to go to the emergency room stat for further evaluation.  Trevor Mace, MD

## 2022-01-09 NOTE — ED Triage Notes (Signed)
Pt with worsening cough and runny nose x1 day. Took a COVID test at home and it is positive. Pt has a fever of 101 in triage. Pt has pulmonary fibrosis and wears 3L [place] at all times. Was told by his pulmonologist to come to the ER. Pt reports he feels bad.

## 2022-01-09 NOTE — ED Provider Notes (Signed)
[facility] Provider Note: Lowella Dell, MD, FACEP  CSN: [id number] MRN: [id number] ARRIVAL: 01/09/22 at 2248 ROOM: MH09/MH09   CHIEF COMPLAINT  Shortness of Breath   HISTORY OF PRESENT ILLNESS  01/09/22 11:19 PM Dustin Dean is a 71 y.o. male with a history of pulmonary fibrosis on home oxygen 3 L by nasal cannula at all times.  He is here with general malaise, shortness of breath, cough, nasal congestion and rhinorrhea since yesterday.  He took a COVID test at home and it was positive.  His temperature on arrival was 101.3.   Past Medical History:  Diagnosis Date   Aortic stenosis    Flu    Pneumonia    Prostate cancer (HCC)    Pulmonary fibrosis (HCC)    Skin cancer     Past Surgical History:  Procedure Laterality Date   HERNIA REPAIR     NASAL SEPTUM SURGERY     SKIN CANCER EXCISION      Family History  Problem Relation Age of Onset   Cancer Mother    Congestive Heart Failure Father    Prostate cancer Father    Healthy Sister     Social History   Tobacco Use   Smoking status: Never    Passive exposure: Past   Smokeless tobacco: Never  Vaping Use   Vaping Use: Never used  Substance Use Topics   Alcohol use: Not Currently   Drug use: Never    Prior to Admission medications   Medication Sig Start Date End Date Taking? Authorizing Provider  acetaminophen (TYLENOL) 325 MG tablet Take by mouth as needed.    [provider]  aspirin-acetaminophen-caffeine (EXCEDRIN MIGRAINE) [phone] MG tablet Take by mouth as needed.    [provider]  Calcium Carb-Cholecalciferol (CALCIUM/VITAMIN D PO) Take 1 tablet by mouth daily.    [provider]  Cetirizine HCl (ZYRTEC PO) Take by mouth.    [provider]  doxycycline (VIBRA-TABS) 100 MG tablet Take 1 tablet (100 mg total) by mouth 2 (two) times daily. 11/27/21   Kalman Shan, MD  fluorouracil (EFUDEX) 5 % cream Apply topically. 12/21/20   [provider]  fluticasone (FLONASE) 50 MCG/ACT nasal spray Place into both nostrils daily.    [provider]  gabapentin (NEURONTIN) 300 MG capsule Take 300 mg by mouth 3 (three) times daily. 09/07/20   [provider]  loperamide (IMODIUM) 2 MG capsule Take 1 mg by mouth once. 01/16/08   [provider]  meclizine (ANTIVERT) 25 MG tablet Take 25 mg by mouth 2 (two) times daily as needed. 10/18/20   [provider]  Multiple Vitamin (MULTIVITAMIN) capsule Take 1 capsule by mouth every morning.    [provider]  naproxen sodium (ALEVE) 220 MG tablet Take 220 mg by mouth daily as needed.    [provider]  omeprazole (PRILOSEC) 40 MG capsule Take 40 mg by mouth daily. 09/27/20   [provider]  PAROXETINE HCL PO Take by mouth.    [provider]  Pirfenidone (ESBRIET) 267 MG TABS Take 3 tablets (801 mg total) by mouth 3 (three) times daily with meals. 06/19/21   Kalman Shan, MD  rosuvastatin (CRESTOR) 5 MG tablet Take 1 tablet by mouth daily. 09/16/18   [provider]  SUMAtriptan (IMITREX) 100 MG tablet TAKE ONE TABLET BY MOUTH AT ONSET OF HEADACHE; MAY REPEAT ONE TABLET IN 2 HOURS IF NEEDED. MAX OF 200 MG PER DAY 10/13/18  [provider]    Allergies Bee venom, Shellfish allergy, Nintedanib, and Pseudoephedrine   REVIEW OF SYSTEMS  Negative except as noted here or in the History of Present Illness.   PHYSICAL EXAMINATION  Initial Vital Signs Blood pressure (!) 131/99, pulse (!) 114, temperature (!) 101.3 F (38.5 C), temperature source Oral, resp. rate (!) 32, height 5\' 8"  (1.727 m), weight 91.6 kg, SpO2 97 %.  Examination General: Well-developed, well-nourished male in no acute distress; appearance consistent with age of record HENT: normocephalic; atraumatic Eyes: Normal appearance Neck: supple Heart: regular rate and rhythm; tachycardia Lungs: Crackles in bases; tachypnea Abdomen:  soft; nondistended; nontender; bowel sounds present Extremities: No deformity; full range of motion; trace edema of lower legs Neurologic: Awake, alert and oriented; motor function intact in all extremities and symmetric; no facial droop Skin: Warm and dry Psychiatric: Normal mood and affect   RESULTS  Summary of this visit's results, reviewed and interpreted by myself:   EKG Interpretation  Date/Time:  Tuesday January 09 2022 23:46:14 EST Ventricular Rate:  114 PR Interval:  164 QRS Duration: 88 QT Interval:  314 QTC Calculation: 433 R Axis:   -18 Text Interpretation: Sinus tachycardia Borderline left axis deviation No significant change was found Confirmed by Kenlyn Lose, Jonny Ruiz ([id number]) on 01/09/2022 11:48:18 PM       Laboratory Studies: Results for orders placed or performed during the hospital encounter of 01/09/22 (from the past 24 hour(s))  Comprehensive metabolic panel     Status: Abnormal   Collection Time: 01/09/22 11:37 PM  Result Value Ref Range   Sodium 135 135 - 145 mmol/L   Potassium 4.2 3.5 - 5.1 mmol/L   Chloride 101 98 - 111 mmol/L   CO2 29 22 - 32 mmol/L   Glucose, Bld 107 (H) 70 - 99 mg/dL   BUN 17 8 - 23 mg/dL   Creatinine, Ser 1.95 (H) 0.61 - 1.24 mg/dL   Calcium 8.4 (L) 8.9 - 10.3 mg/dL   Total Protein 7.5 6.5 - 8.1 g/dL   Albumin 3.6 3.5 - 5.0 g/dL   AST 21 15 - 41 U/L   ALT 12 0 - 44 U/L   Alkaline Phosphatase 96 38 - 126 U/L   Total Bilirubin 0.6 0.3 - 1.2 mg/dL   GFR, Estimated 57 (L) >60 mL/min   Anion gap 5 5 - 15  Lactic acid, plasma     Status: None   Collection Time: 01/09/22 11:37 PM  Result Value Ref Range   Lactic Acid, Venous 0.8 0.5 - 1.9 mmol/L  CBC with Differential     Status: Abnormal   Collection Time: 01/09/22 11:37 PM  Result Value Ref Range   WBC 11.2 (H) 4.0 - 10.5 K/uL   RBC 4.84 4.22 - 5.81 MIL/uL   Hemoglobin 13.8 13.0 - 17.0 g/dL   HCT 09.3 26.7 - 12.4 %   MCV 88.2 80.0 - 100.0 fL   MCH 28.5 26.0 - 34.0 pg   MCHC  32.3 30.0 - 36.0 g/dL   RDW 58.0 (H) 99.8 - 33.8 %   Platelets 130 (L) 150 - 400 K/uL   nRBC 0.0 0.0 - 0.2 %   Neutrophils Relative % 81 %   Neutro Abs 9.1 (H) 1.7 - 7.7 K/uL   Lymphocytes Relative 8 %   Lymphs Abs 0.8 0.7 - 4.0 K/uL   Monocytes Relative 6 %   Monocytes Absolute 0.7 0.1 - 1.0 K/uL   Eosinophils Relative 4 %   Eosinophils  Absolute 0.4 0.0 - 0.5 K/uL   Basophils Relative 0 %   Basophils Absolute 0.1 0.0 - 0.1 K/uL   Immature Granulocytes 1 %   Abs Immature Granulocytes 0.07 0.00 - 0.07 K/uL  Protime-INR     Status: None   Collection Time: 01/09/22 11:37 PM  Result Value Ref Range   Prothrombin Time 13.5 11.4 - 15.2 seconds   INR 1.0 0.8 - 1.2  Resp panel by RT-PCR (RSV, Flu A&B, Covid) Anterior Nasal Swab     Status: Abnormal   Collection Time: 01/09/22 11:51 PM   Specimen: Anterior Nasal Swab  Result Value Ref Range   SARS Coronavirus 2 by RT PCR POSITIVE (A) NEGATIVE   Influenza A by PCR NEGATIVE NEGATIVE   Influenza B by PCR NEGATIVE NEGATIVE   Resp Syncytial Virus by PCR NEGATIVE NEGATIVE   Imaging Studies: DG Chest Port 1 View  Result Date: 01/09/2022 CLINICAL DATA:  Worsening cough for 1 day EXAM: PORTABLE CHEST 1 VIEW COMPARISON:  05/01/2021 FINDINGS: Cardiac shadow is stable. Diffuse fibrotic changes are again identified and stable. No new focal infiltrate is seen. No bony abnormality is noted. IMPRESSION: Chronic fibrotic changes stable from the previous exam. Electronically Signed   By: Alcide Clever M.D.   On: 01/09/2022 23:35    ED COURSE and MDM  Nursing notes, initial and subsequent vitals signs, including pulse oximetry, reviewed and interpreted by myself.  Vitals:   01/09/22 2308 01/09/22 2310 01/10/22 0030 01/10/22 0156  BP:   133/70 137/80  Pulse:   (!) 125 (!) 134  Resp:  (!) 32 (!) 29 (!) 30  Temp:   (!) 100.6 F (38.1 C) 98.8 F (37.1 C)  TempSrc:   Oral Oral  SpO2:   99% 99%  Weight: 91.6 kg     Height: 5\' 8"  (1.727 m)       Medications  remdesivir 100 mg in sodium chloride 0.9 % 100 mL IVPB (100 mg Intravenous New Bag/Given 01/10/22 0214)  remdesivir 100 mg in sodium chloride 0.9 % 100 mL IVPB (has no administration in time range)  sodium chloride 0.9 % bolus 1,000 mL (has no administration in time range)  acetaminophen (TYLENOL) tablet 650 mg (650 mg Oral Given 01/09/22 2339)  dexamethasone (DECADRON) injection 10 mg (10 mg Intravenous Given 01/10/22 0217)   1:49 AM Patient confirmed positive for COVID.  His oxygen saturation is 97% on supplemental oxygen but his respiratory rate is somewhat higher than usual for him.  We will go ahead and start him on remdesivir.  2:34 AM Dr. Loney Loh accepts for admission to hospitalist service.  4:09 AM Patient has chosen not to be admitted and would like to trial home treatment with Paxlovid.  PROCEDURES  Procedures   ED DIAGNOSES     ICD-10-CM   1. Acute respiratory disease due to COVID-19 virus  U07.1    J06.9          Alvin Diffee, MD 01/10/22 [phone]

## 2022-01-10 ENCOUNTER — Encounter (HOSPITAL_COMMUNITY): Payer: Self-pay

## 2022-01-10 ENCOUNTER — Telehealth: Payer: Self-pay | Admitting: Internal Medicine

## 2022-01-10 DIAGNOSIS — U071 COVID-19: Secondary | ICD-10-CM | POA: Diagnosis present

## 2022-01-10 LAB — COMPREHENSIVE METABOLIC PANEL
ALT: 12 U/L (ref 0–44)
AST: 21 U/L (ref 15–41)
Albumin: 3.6 g/dL (ref 3.5–5.0)
Alkaline Phosphatase: 96 U/L (ref 38–126)
Anion gap: 5 (ref 5–15)
BUN: 17 mg/dL (ref 8–23)
CO2: 29 mmol/L (ref 22–32)
Calcium: 8.4 mg/dL — ABNORMAL LOW (ref 8.9–10.3)
Chloride: 101 mmol/L (ref 98–111)
Creatinine, Ser: 1.35 mg/dL — ABNORMAL HIGH (ref 0.61–1.24)
GFR, Estimated: 57 mL/min — ABNORMAL LOW (ref >60)
Glucose, Bld: 107 mg/dL — ABNORMAL HIGH (ref 70–99)
Potassium: 4.2 mmol/L (ref 3.5–5.1)
Sodium: 135 mmol/L (ref 135–145)
Total Bilirubin: 0.6 mg/dL (ref 0.3–1.2)
Total Protein: 7.5 g/dL (ref 6.5–8.1)

## 2022-01-10 LAB — PROTIME-INR
INR: 1 (ref 0.8–1.2)
Prothrombin Time: 13.5 seconds (ref 11.4–15.2)

## 2022-01-10 LAB — RESP PANEL BY RT-PCR (RSV, FLU A&B, COVID)  RVPGX2
Influenza A by PCR: NEGATIVE
Influenza B by PCR: NEGATIVE
Resp Syncytial Virus by PCR: NEGATIVE
SARS Coronavirus 2 by RT PCR: POSITIVE — AB

## 2022-01-10 LAB — LACTIC ACID, PLASMA: Lactic Acid, Venous: 0.8 mmol/L (ref 0.5–1.9)

## 2022-01-10 MED ORDER — SODIUM CHLORIDE 0.9 % IV BOLUS
1000.0000 mL | Freq: Once | INTRAVENOUS | Status: AC
  Administered 2022-01-10: 1000 mL via INTRAVENOUS

## 2022-01-10 MED ORDER — DEXAMETHASONE SODIUM PHOSPHATE 10 MG/ML IJ SOLN
10.0000 mg | Freq: Once | INTRAMUSCULAR | Status: AC
  Administered 2022-01-10: 10 mg via INTRAVENOUS
  Filled 2022-01-10: qty 1

## 2022-01-10 MED ORDER — SODIUM CHLORIDE 0.9 % IV SOLN
100.0000 mg | INTRAVENOUS | Status: AC
  Administered 2022-01-10 (×2): 100 mg via INTRAVENOUS

## 2022-01-10 MED ORDER — NIRMATRELVIR/RITONAVIR (PAXLOVID)TABLET: ORAL_TABLET | ORAL | 0 refills | Status: DC

## 2022-01-10 MED ORDER — MOLNUPIRAVIR EUA 200MG CAPSULE: 4.0000 | ORAL_CAPSULE | Freq: Two times a day (BID) | ORAL | 0 refills | Status: AC

## 2022-01-10 MED ORDER — SODIUM CHLORIDE 0.9 % IV SOLN: 100.0000 mg | Freq: Every day | INTRAVENOUS | Status: DC

## 2022-01-10 NOTE — Telephone Encounter (Signed)
Spoke with patient advised on new prescription sent to pharmacy. He verbalized understanding. Nothing further needed

## 2022-01-10 NOTE — Telephone Encounter (Signed)
Molnupiravir prescription sent to [facility]

## 2022-01-10 NOTE — Telephone Encounter (Signed)
Called and spoke with patient. He stated that she was seen in the ED yesterday and was diagnosed with COVID. He was prescribed Paxlovid. His pharmacy called him and told him that his insurance would not pay for Paxlovid and the government grant has expired. Paxlovid is now $1400 out of pocket.   He wanted to know if there was something else that could be prescribed for him. I did find patient assistance for Paxlovid but it is only for commercially insured patients. He has Medicare.   Dr. Verlee Monte, can you please advise since MR is not available today?

## 2022-01-10 NOTE — Plan of Care (Signed)
[facility] will assume care on arrival to accepting facility. Until arrival, care as per EDP. However, [facility] available 24/7 for questions and assistance.  Nursing staff, please page [facility] Admits and Consults ([phone]) as soon as the patient arrives to the hospital.   

## 2022-01-10 NOTE — Telephone Encounter (Signed)
Patient called to inform the nurse and doctor that he went to the hospital yesterday and tested positive for covid.  Patient would like to know what is his next step.  Also patient stated he was prescribed Paxlovid, but his insurance would not cover the cost and he wanted to know if there was anything the doctor could suggest.  Please call patient to discuss at [phone]

## 2022-01-15 LAB — CULTURE, BLOOD (ROUTINE X 2)
Culture: NO GROWTH
Culture: NO GROWTH
Special Requests: ADEQUATE
Special Requests: ADEQUATE

## 2022-01-16 ENCOUNTER — Encounter: Payer: Self-pay | Admitting: Internal Medicine

## 2022-01-17 ENCOUNTER — Other Ambulatory Visit: Payer: Self-pay | Admitting: Pharmacist

## 2022-01-17 DIAGNOSIS — J849 Interstitial pulmonary disease, unspecified: Secondary | ICD-10-CM

## 2022-01-17 NOTE — Telephone Encounter (Signed)
Mychart message sent by pt: [facility]in Dean [facility] (supporting Brand Males, MD)14 hours ago (8:10 PM)    Hi Dr. Chase Caller,   Just a few items to keep you updated on the latest things going on with me.   I tested positive for Covid on 12/26 and went to the ER at [facility] in [facility]. The summary in MyChart of course tells you what happened there. I was not admitted and instead when home about 5am when my fever had returned to normal. The next day, in your absence, Dr. Verlee Monte prescribed molnupiravir when I completed taking yesterday.   This morning, after six full days, I am still testing positive with Covid. Do you think that more medication is needed? 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   You will get a call, if you haven't already, to renew my prescription for Esbriet.   Thanks.   Dustin Dean   MR, please advise.

## 2022-01-18 MED ORDER — PIRFENIDONE 801 MG PO TABS: 801.0000 mg | ORAL_TABLET | Freq: Three times a day (TID) | ORAL | 1 refills | Status: DC

## 2022-01-18 NOTE — Telephone Encounter (Signed)
Refill sent for ESBRIET to [facility] ([facility]) for Esbriet: [phone]  Dose: '801mg'$  three times daily  Last OV: 11/17/2021 Provider: Dr. Chase Caller  Next OV: 02/26/2022  CMP on 01/09/22 LFTs wnl  Knox Saliva, PharmD, MPH, BCPS Clinical Pharmacist (Rheumatology and Pulmonology)

## 2022-01-18 NOTE — Telephone Encounter (Signed)
Recommend repeat antigen testing tomorrow 01/19/22  +/- today 01/18/2022 - usually by day 11 which is tomorrow - ppl are no longer contagious. If the antigen is till positive 01/19/22 - then he could be having an antiviral breakthrough which happens 20% of the time. Typically there is not evidence to Rx again with a 2nd round but some docs do and I might be inclined because he has ILD.   Paxlovid - I though is free in the [place] with a docs prescription. Is underwritten by the [employer]

## 2022-01-19 MED ORDER — NIRMATRELVIR/RITONAVIR (PAXLOVID)TABLET: 2.0000 | ORAL_TABLET | Freq: Two times a day (BID) | ORAL | 0 refills | Status: DC

## 2022-01-19 NOTE — Telephone Encounter (Signed)
Spoke tp patient - . Ps call In reduced dose paxlovid. Because his GFR around xmas was 57. He is symptomatic   pLAN   Paxlovid (nirmatelvir 150/ritonavir 100) - BID x 5 days - for GFR 30-60 - [facility], [place], [address]. Hold crestor during this period   Hopefully [facility] does not charge him       Current Outpatient Medications:    acetaminophen (TYLENOL) 325 MG tablet, Take by mouth as needed., Disp: , Rfl:    aspirin-acetaminophen-caffeine (EXCEDRIN MIGRAINE) 250-250-65 MG tablet, Take by mouth as needed., Disp: , Rfl:    Calcium Carb-Cholecalciferol (CALCIUM/VITAMIN D PO), Take 1 tablet by mouth daily., Disp: , Rfl:    Cetirizine HCl (ZYRTEC PO), Take by mouth., Disp: , Rfl:    doxycycline (VIBRA-TABS) 100 MG tablet, Take 1 tablet (100 mg total) by mouth 2 (two) times daily., Disp: 14 tablet, Rfl: 0   fluorouracil (EFUDEX) 5 % cream, Apply topically., Disp: , Rfl:    fluticasone (FLONASE) 50 MCG/ACT nasal spray, Place into both nostrils daily., Disp: , Rfl:    gabapentin (NEURONTIN) 300 MG capsule, Take 300 mg by mouth 3 (three) times daily., Disp: , Rfl:    loperamide (IMODIUM) 2 MG capsule, Take 1 mg by mouth once., Disp: , Rfl:    meclizine (ANTIVERT) 25 MG tablet, Take 25 mg by mouth 2 (two) times daily as needed., Disp: , Rfl:    Multiple Vitamin (MULTIVITAMIN) capsule, Take 1 capsule by mouth every morning., Disp: , Rfl:    naproxen sodium (ALEVE) 220 MG tablet, Take 220 mg by mouth daily as needed., Disp: , Rfl:    omeprazole (PRILOSEC) 40 MG capsule, Take 40 mg by mouth daily., Disp: , Rfl:    PAROXETINE HCL PO, Take by mouth., Disp: , Rfl:    Pirfenidone (ESBRIET) 801 MG TABS, Take 801 mg by mouth 3 (three) times daily with meals., Disp: 270 tablet, Rfl: 1   rosuvastatin (CRESTOR) 5 MG tablet, Take 1 tablet by mouth daily., Disp: , Rfl:    SUMAtriptan (IMITREX) 100 MG tablet, TAKE ONE TABLET BY MOUTH AT ONSET OF HEADACHE; MAY REPEAT ONE TABLET IN 2  HOURS IF NEEDED. MAX OF 200 MG PER DAY, Disp: , Rfl:    PAXLOVID   Paxlovid (nirmatelvir 300/Ritonavir100) - BID x 5 days - for GFR >= 60    Paxlovid - not recommended for GFR < 30   PLEASE CHECK MED LIST for the following issues. Please check the 2 different condition related to concomitant medications in alphabetical order  If Dustin Dean with DOB Apr 24, 1950 is on any of the following Strong CYP3A inhibtors - this patient Dustin Dean should withold these concomitant meds so they can start paxlovid stratight away. If taking any of these:  alfuzosin, amiodarone, clozapine, colchicine, dihydroergotamine, dronedarone, ergotamine, flecainide, lovastatin, lurasidone, methylergonovine, midazolam [oral], pethidine, pimozide, propafenone, propoxyphene, quinidine, ranolazine, sildenafil simvastatin, triazolam).   If   Dustin Dean  with dob [birth year] Is on any of these other strong CYP3A inducers then starting paxlovid should be delayed and the following meds should wash out first. These are dapalutamide, carbamazepine, phenobarbital, phenytoin, rifampin, St John's wort) - let me know immediately and we should delay starting paxlovid by some days even if he stops these medication.    PLEASE INFORM Dustin Dean  OF FOLLOWING SIDE EFFECTS  Side effects - all < 5%  - skin rash (and veyr rare a conditon called TEN) - angiomedia  - myalgia -  jaundice - high bP (1%) - loss of taste  - diarrhea

## 2022-01-19 NOTE — Telephone Encounter (Signed)
New mychart message sent by pt: [facility] (supporting Brand Males, MD)1 hour ago (11:54 AM)    Raquel Sarna and all,   My home covid test is positive. On 1/2 when I tested positive, the test line was faint. Just now it is dark in color - does this mean anything? Today marks my tenth full day of covid. Symptoms are congestion, the worst runny nose I've ever had in my life, fatigue, and a sinus headache which is a new symptom.   If Dr. Chase Caller wants to prescribe Paxlovid, can it be done through your pharmacy there? Maybe there is a way for your pharmacy to see that I qualify for the subsidy. At my pharmacy in [place] they tell me it will be $1400. They are aware I am on Medicare and have all my insurance cards.   Any other advice will be welcome. It might be helpful if someone has the time to call me today, [phone].   I have so many concerns. 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 I have additional medical appointments next week on Wednesday and Thursday.   Thank you, Aimar Borghi     MR, please advise

## 2022-01-20 ENCOUNTER — Telehealth: Payer: Self-pay | Admitting: Pulmonary Disease

## 2022-01-20 MED ORDER — NIRMATRELVIR/RITONAVIR (PAXLOVID)TABLET: 2.0000 | ORAL_TABLET | Freq: Two times a day (BID) | ORAL | 0 refills | Status: AC

## 2022-01-20 NOTE — Telephone Encounter (Signed)
PCCM weekend coverage note  Patient called and told that the paxlovid prescription from Dr. Chase Caller was sent to [facility] but needed to go to [facility] at [address], [place]. A new prescription has been sent to the correct pharmacy.  Marshell Garfinkel MD [facility] See Amion for pager  If no response to pager , please call [phone] until 7pm After 7:00 pm call Elink  [phone] 01/20/2022, 12:36 PM

## 2022-01-22 ENCOUNTER — Telehealth: Payer: Self-pay | Admitting: Internal Medicine

## 2022-01-26 ENCOUNTER — Ambulatory Visit (INDEPENDENT_AMBULATORY_CARE_PROVIDER_SITE_OTHER): Payer: Medicare Other | Admitting: Psychologist

## 2022-01-26 DIAGNOSIS — F411 Generalized anxiety disorder: Secondary | ICD-10-CM | POA: Diagnosis not present

## 2022-01-26 DIAGNOSIS — F330 Major depressive disorder, recurrent, mild: Secondary | ICD-10-CM | POA: Diagnosis not present

## 2022-01-26 NOTE — Progress Notes (Signed)
[facility] [place] Counselor/Therapist Progress Note  Patient ID: Dustin Dean, MRN: [id number],    Date: 01/26/2022  Time Spent: 11:07 am to 11:45 am; total time: 38  minutes   This session was held via in person. The patient consented to in-person therapy and was in the clinician's office. Limits of confidentiality were discussed with the patient.    Treatment Type: Individual Therapy  Reported Symptoms: Difficulty with motivation  Mental Status Exam: Appearance:  Well Groomed     Behavior: Appropriate  Motor: Normal  Speech/Language:  Clear and Coherent  Affect: Appropriate  Mood: normal  Thought process: normal  Thought content:   WNL  Sensory/Perceptual disturbances:   WNL  Orientation: oriented to person, place, and time/date  Attention: Good  Concentration: Good  Memory: WNL  Fund of knowledge:  Good  Insight:   Good  Judgment:  Good  Impulse Control: Good   Risk Assessment: Danger to Self:  No Self-injurious Behavior: No Danger to Others: No Duty to Warn:no Physical Aggression / Violence:No  Access to Firearms a concern: No  Gang Involvement:No   Subjective: Beginning the session, patient described himself as okay and reflected on the holidays. After reviewing the treatment plan, patient spent the session reflecting on ways to become motivated to complete different tasks. He processed thoughts and emotions. He was agreeable to homework and following up. He denied suicidal and homicidal ideation.    Interventions:  Worked on developing a therapeutic relationship with the patient using active listening and reflective statements. Provided emotional support using empathy and validation. Reviewed the treatment plan with the patient. Reviewed events since the intake. Normalized and validated expressed emotions. Identified goals for the session. Used socratic questions to assist the patient. Challenged some of the thoughts expressed. Provided psychoeducation  about behavioral activation. Explored reasons to make a change. Used examples to assist the patient. Identified values and processed how to use values to guide behaviors. Assisted in problem solving. Assigned homework. Assessed for suicidal and homicidal ideation.   Homework: Review handout on values  Next Session: Review handout  Diagnosis: F33.0 major depressive affective disorder, recurrent, mild and F41.1 generalized anxiety disorder  Plan:  Goals Work through the grieving process and face reality of own death Accept emotional support from others around them Live life to the fullest, event though time may be limited Become as knowledgeable about the medical condition  Reduce fear, anxiety about the health condition  Accept the illness Accept the role of psychological and behavioral factors  Stabilize anxiety level wile increasing ability to function Learn and implement coping skills that result in a reduction of anxiety  Alleviate depressive symptoms Recognize, accept, and cope with depressive feelings Develop healthy thinking patterns Develop healthy interpersonal relationships  Objectives target date for all objectives is 12/21/2022 Identify feelings associated with the illness Family members share with each other feelings Identify the losses or limitations that have been experienced Verbalize acceptance of the reality of the medical condition Commit to learning and implement a proactive approach to managing personal stresses Verbalize an understanding of the medical condition Work with therapist to develop a plan for coping with stress Learn and implement skills for managing stress Engage in social, productive activities that are possible Engage in faith based activities implement positive imagery Identify coping skills and sources of emotional support Patient's partner and family members verbalize their fears regarding severity of health condition Identify sources of  emotional distress  Learning and implement calming skills to reduce overall anxiety  Learn and implement problem solving strategies Identify and engage in pleasant activities Learning and implement personal and interpersonal skills to reduce anxiety and improve interpersonal relationships Learn to accept limitations in life and commit to tolerating, rather than avoiding, unpleasant emotions while accomplishing meaningful goals Identify major life conflicts from the past and present that form the basis for present anxiety Learn and implement behavioral strategies Verbalize an understanding and resolution of current interpersonal problems Learn and implement problem solving and decision making skills Learn and implement conflict resolution skills to resolve interpersonal problems Verbalize an understanding of healthy and unhealthy emotions verbalize insight into how past relationships may be influence current experiences with depression Use mindfulness and acceptance strategies and increase value based behavior  Increase hopeful statements about the future.   Interventions Teach about stress and ways to handle stress Assist the patient in developing a coping action plan for stressors Conduct skills based training for coping strategies Train problem focused skills Sort out what activities the individual can do Encourage patient to rely upon his/her spiritual faith Teach the patient to use guided imagery Probe and evaluate family's ability to provide emotional support Allow family to share their fears Assist the patient in identifying, sorting through, and verbalizing the various feelings generated by his/her medical condition Meet with family members  Ask patient list out limitations  Use stress inoculation training  Use Acceptance and Commitment Therapy to help client accept uncomfortable realities in order to accomplish value-consistent goals Reinforce the client's insight into the role  of his/her past emotional pain and present anxiety  Discuss examples demonstrating that unrealistic worry overestimates the probability of threats and underestimate patient's ability  Assist the patient in analyzing his or her worries Help patient understand that avoidance is reinforcing  Behavioral activation help the client explore the relationship, nature of the dispute,  Help the client develop new interpersonal skills and relationships Conduct Problem so living therapy Teach conflict resolution skills Use a process-experiential approach Conduct TLDP Conduct ACT  The patient and clinician reviewed the treatment plan on 01/26/2022. The patient approved of the treatment plan.    Conception Chancy, PsyD

## 2022-01-26 NOTE — Progress Notes (Signed)
                Raeshawn Vo, PsyD 

## 2022-02-08 ENCOUNTER — Encounter: Payer: Self-pay | Admitting: Internal Medicine

## 2022-02-08 NOTE — Telephone Encounter (Signed)
Will send to MR as FYI.

## 2022-02-09 ENCOUNTER — Ambulatory Visit (INDEPENDENT_AMBULATORY_CARE_PROVIDER_SITE_OTHER): Payer: Medicare Other | Admitting: Psychologist

## 2022-02-09 DIAGNOSIS — F411 Generalized anxiety disorder: Secondary | ICD-10-CM | POA: Diagnosis not present

## 2022-02-09 DIAGNOSIS — F330 Major depressive disorder, recurrent, mild: Secondary | ICD-10-CM | POA: Diagnosis not present

## 2022-02-09 NOTE — Progress Notes (Signed)
[facility] Counselor/Therapist Progress Note  Patient ID: Latravis Grine, MRN: [id number],    Date: 02/09/2022  Time Spent: 11:08 am to 11:46 am; total time: 38 minutes   This session was held via in person. The patient consented to in-person therapy and was in the clinician's office. Limits of confidentiality were discussed with the patient.    Treatment Type: Individual Therapy  Reported Symptoms: Some conflict with wife  Mental Status Exam: Appearance:  Well Groomed     Behavior: Appropriate  Motor: Normal  Speech/Language:  Clear and Coherent  Affect: Appropriate  Mood: normal  Thought process: normal  Thought content:   WNL  Sensory/Perceptual disturbances:   WNL  Orientation: oriented to person, place, and time/date  Attention: Good  Concentration: Good  Memory: WNL  Fund of knowledge:  Good  Insight:   Good  Judgment:  Good  Impulse Control: Good   Risk Assessment: Danger to Self:  No Self-injurious Behavior: No Danger to Others: No Duty to Warn:no Physical Aggression / Violence:No  Access to Firearms a concern: No  Gang Involvement:No   Subjective: Beginning the session, patient described himself as okay and voiced that he had not accomplished his homework of reviewing values. He did voice that he has been more physically active. From there, he talked about some frustrations he experiences with his wife. He reflected on these frustrations and stated that he wanted coping skills. He then identified defusion as being really helpful for him. He processed thoughts and emotions. He was agreeable to homework and following up. He denied suicidal and homicidal ideation.    Interventions:  Worked on developing a therapeutic relationship with the patient using active listening and reflective statements. Provided emotional support using empathy and validation. Reviewed the treatment plan with the patient. Praised the patient for doing well and explored what has  assisted. Explored whether or not patient had completed the homework assignment. Identified goals for the session. Reviewed barriers to completing assignments. Praised patient for being more active and identifying a strategy. Explored and processed conflict with wife. Used socratic questions to assist the patient. Challenged some of the thoughts expressed. Re-identified goals for the session. Provided psychoeducation about defusion. Practiced and processed defusion. Praised patient for experiencing less distress. Assisted in problem solving. Assigned homework. Assessed for suicidal and homicidal ideation.   Homework: Review handout on values and implement defusion  Next Session: Review homework and emotional support  Diagnosis: F33.0 major depressive affective disorder, recurrent, mild and F41.1 generalized anxiety disorder  Plan:  Goals Work through the grieving process and face reality of own death Accept emotional support from others around them Live life to the fullest, event though time may be limited Become as knowledgeable about the medical condition  Reduce fear, anxiety about the health condition  Accept the illness Accept the role of psychological and behavioral factors  Stabilize anxiety level wile increasing ability to function Learn and implement coping skills that result in a reduction of anxiety  Alleviate depressive symptoms Recognize, accept, and cope with depressive feelings Develop healthy thinking patterns Develop healthy interpersonal relationships  Objectives target date for all objectives is 12/21/2022 Identify feelings associated with the illness Family members share with each other feelings Identify the losses or limitations that have been experienced Verbalize acceptance of the reality of the medical condition Commit to learning and implement a proactive approach to managing personal stresses Verbalize an understanding of the medical condition Work with  therapist to develop a plan for coping  with stress Learn and implement skills for managing stress Engage in social, productive activities that are possible Engage in faith based activities implement positive imagery Identify coping skills and sources of emotional support Patient's partner and family members verbalize their fears regarding severity of health condition Identify sources of emotional distress  Learning and implement calming skills to reduce overall anxiety Learn and implement problem solving strategies Identify and engage in pleasant activities Learning and implement personal and interpersonal skills to reduce anxiety and improve interpersonal relationships Learn to accept limitations in life and commit to tolerating, rather than avoiding, unpleasant emotions while accomplishing meaningful goals Identify major life conflicts from the past and present that form the basis for present anxiety Learn and implement behavioral strategies Verbalize an understanding and resolution of current interpersonal problems Learn and implement problem solving and decision making skills Learn and implement conflict resolution skills to resolve interpersonal problems Verbalize an understanding of healthy and unhealthy emotions verbalize insight into how past relationships may be influence current experiences with depression Use mindfulness and acceptance strategies and increase value based behavior  Increase hopeful statements about the future.   Interventions Teach about stress and ways to handle stress Assist the patient in developing a coping action plan for stressors Conduct skills based training for coping strategies Train problem focused skills Sort out what activities the individual can do Encourage patient to rely upon his/her spiritual faith Teach the patient to use guided imagery Probe and evaluate family's ability to provide emotional support Allow family to share their  fears Assist the patient in identifying, sorting through, and verbalizing the various feelings generated by his/her medical condition Meet with family members  Ask patient list out limitations  Use stress inoculation training  Use Acceptance and Commitment Therapy to help client accept uncomfortable realities in order to accomplish value-consistent goals Reinforce the client's insight into the role of his/her past emotional pain and present anxiety  Discuss examples demonstrating that unrealistic worry overestimates the probability of threats and underestimate patient's ability  Assist the patient in analyzing his or her worries Help patient understand that avoidance is reinforcing  Behavioral activation help the client explore the relationship, nature of the dispute,  Help the client develop new interpersonal skills and relationships Conduct Problem so living therapy Teach conflict resolution skills Use a process-experiential approach Conduct TLDP Conduct ACT  The patient and clinician reviewed the treatment plan on 01/26/2022. The patient approved of the treatment plan.    Conception Chancy, PsyD

## 2022-02-12 NOTE — Telephone Encounter (Signed)
Cxr 12/26 was stable. On 12/6 - RASt profile showed opositive allergy to dog and cat. His blood eos were high.  I do not think he has dog or cat at home  Plan  - I will repeat cbc with diff when he comes +/- CXR  -get FENO as well on day of PFT  - might have to start biologic but meanwhile if there is a breo low dose - he can start 1 per day

## 2022-02-12 NOTE — Telephone Encounter (Signed)
Noted nothing further needed at this time. 

## 2022-02-23 ENCOUNTER — Ambulatory Visit: Payer: Medicare Other | Admitting: Psychologist

## 2022-02-23 ENCOUNTER — Ambulatory Visit (INDEPENDENT_AMBULATORY_CARE_PROVIDER_SITE_OTHER): Payer: Medicare Other | Admitting: Psychologist

## 2022-02-23 DIAGNOSIS — F330 Major depressive disorder, recurrent, mild: Secondary | ICD-10-CM | POA: Diagnosis not present

## 2022-02-23 DIAGNOSIS — F411 Generalized anxiety disorder: Secondary | ICD-10-CM | POA: Diagnosis not present

## 2022-02-23 NOTE — Progress Notes (Signed)
[facility] Counselor/Therapist Progress Note  Patient ID: Dustin Dean, MRN: [id number],    Date: 02/23/2022  Time Spent: 11:30 am to 11:50 am; total time: 20 minutes   This session was held via in person. The patient consented to in-person therapy and was in the clinician's office. Limits of confidentiality were discussed with the patient.    Treatment Type: Individual Therapy  Reported Symptoms: Less conflict with wife. Concern regarding contacting previous people in life  Mental Status Exam: Appearance:  Well Groomed     Behavior: Appropriate  Motor: Normal  Speech/Language:  Clear and Coherent  Affect: Appropriate  Mood: normal  Thought process: normal  Thought content:   WNL  Sensory/Perceptual disturbances:   WNL  Orientation: oriented to person, place, and time/date  Attention: Good  Concentration: Good  Memory: WNL  Fund of knowledge:  Good  Insight:   Good  Judgment:  Good  Impulse Control: Good   Risk Assessment: Danger to Self:  No Self-injurious Behavior: No Danger to Others: No Duty to Warn:no Physical Aggression / Violence:No  Access to Firearms a concern: No  Gang Involvement:No   Subjective: Beginning the session, patient described himself as well voicing that defusion and speaking with his wife has helped. From there, he explored and reflected on the idea of whether or not he should reach out to previous people in his life who impacted him positively. He explored the reasons for reaching out. He processed thoughts and emotions. He was agreeable to homework and following up. He denied suicidal and homicidal ideation.    Interventions:  Worked on developing a therapeutic relationship with the patient using active listening and reflective statements. Provided emotional support using empathy and validation. Reviewed the treatment plan with the patient. Reviewed events since the last session. Explored what plans patient had for the upcoming  weekend. Praised patient for implementing defusion and experiencing less distress. Identified goals. Used socratic questions to assist the patient. Processed the reasons for why patient wants to reach out to individuals. Processed thoughts and emotions. Assisted in problem solving. Provided empathic statements. Assigned homework. Assessed for suicidal and homicidal ideation.   Homework: Email people that had an impact on him from earlier in life  Next Session: Review homework and emotional support  Diagnosis: F33.0 major depressive affective disorder, recurrent, mild and F41.1 generalized anxiety disorder  Plan:  Goals Work through the grieving process and face reality of own death Accept emotional support from others around them Live life to the fullest, event though time may be limited Become as knowledgeable about the medical condition  Reduce fear, anxiety about the health condition  Accept the illness Accept the role of psychological and behavioral factors  Stabilize anxiety level wile increasing ability to function Learn and implement coping skills that result in a reduction of anxiety  Alleviate depressive symptoms Recognize, accept, and cope with depressive feelings Develop healthy thinking patterns Develop healthy interpersonal relationships  Objectives target date for all objectives is 12/21/2022 Identify feelings associated with the illness Family members share with each other feelings Identify the losses or limitations that have been experienced Verbalize acceptance of the reality of the medical condition Commit to learning and implement a proactive approach to managing personal stresses Verbalize an understanding of the medical condition Work with therapist to develop a plan for coping with stress Learn and implement skills for managing stress Engage in social, productive activities that are possible Engage in faith based activities implement positive imagery Identify  coping  skills and sources of emotional support Patient's partner and family members verbalize their fears regarding severity of health condition Identify sources of emotional distress  Learning and implement calming skills to reduce overall anxiety Learn and implement problem solving strategies Identify and engage in pleasant activities Learning and implement personal and interpersonal skills to reduce anxiety and improve interpersonal relationships Learn to accept limitations in life and commit to tolerating, rather than avoiding, unpleasant emotions while accomplishing meaningful goals Identify major life conflicts from the past and present that form the basis for present anxiety Learn and implement behavioral strategies Verbalize an understanding and resolution of current interpersonal problems Learn and implement problem solving and decision making skills Learn and implement conflict resolution skills to resolve interpersonal problems Verbalize an understanding of healthy and unhealthy emotions verbalize insight into how past relationships may be influence current experiences with depression Use mindfulness and acceptance strategies and increase value based behavior  Increase hopeful statements about the future.   Interventions Teach about stress and ways to handle stress Assist the patient in developing a coping action plan for stressors Conduct skills based training for coping strategies Train problem focused skills Sort out what activities the individual can do Encourage patient to rely upon his/her spiritual faith Teach the patient to use guided imagery Probe and evaluate family's ability to provide emotional support Allow family to share their fears Assist the patient in identifying, sorting through, and verbalizing the various feelings generated by his/her medical condition Meet with family members  Ask patient list out limitations  Use stress inoculation training  Use  Acceptance and Commitment Therapy to help client accept uncomfortable realities in order to accomplish value-consistent goals Reinforce the client's insight into the role of his/her past emotional pain and present anxiety  Discuss examples demonstrating that unrealistic worry overestimates the probability of threats and underestimate patient's ability  Assist the patient in analyzing his or her worries Help patient understand that avoidance is reinforcing  Behavioral activation help the client explore the relationship, nature of the dispute,  Help the client develop new interpersonal skills and relationships Conduct Problem so living therapy Teach conflict resolution skills Use a process-experiential approach Conduct TLDP Conduct ACT  The patient and clinician reviewed the treatment plan on 01/26/2022. The patient approved of the treatment plan.    Conception Chancy, PsyD

## 2022-02-25 NOTE — Progress Notes (Unsigned)
Cardiology Dr. Loni Beckwith 02/15/20 Dustin Dean is 72 y.o. year old male with a history of No prior cardiovascular disease who presents to [place] cardiology for the evaluation of a heart murmur. This patient is a non-smoker and denies a previous history of myocardial infarction, congestive heart failure, or valvular heart disease. 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 He really has little in the way of exertional chest symptoms. Occasionally according to the wife if he walks up an incline briskly he may have some minor shortness of breath but remains active and has little in the way limitation. He denies a previous history of myocardial infarction, congestive heart failure, or known valvular heart disease. At a recent examination he was found to have a heart murmur and an echocardiogram was performed. It demonstrated a peak gradient across his aortic valve of 30 mmHg and an aortic valve area of 1.1 cm. This was read as being consistent with moderate aortic stenosis. I had a fairly significant discussion with the patient and his wife about the implications of his aortic valve and the need to follow the aortic stenosis progression over time.  11/14/2020 office visit with Dustin Dean pulmonary physician assistant at [facility] has past medical history of restrictive lung disease, interstitial lung disease, allergic rhinitis, laryngeal pharyngeal reflux, arthritis, lung nodules and persistent cough. He is a non-smoker and has never smoked. He has medication allergy to pseudoephedrine.  Dustin Dean presents today with complaint of a progressively worsening shortness of breath and dyspnea on exertion over the past several months. We have been following his interstitial lung disease since 2018. He has been stable without complaint of dyspnea on exertion over the past several years. He indicates that recently he has had more shortness of  breath with exertion. He and his wife will walk the dog in their usual route through the neighborhood that they have done for many years. He reports that he will get short of breath along the root in a way that he has never been bothered with previously. He has recently requested an albuterol inhaler and reports that it has been helpful. He has been using Symbicort 160/4.5 twice daily but has been having difficulty using it successfully as he has been coughing significantly and loses a lot of the medication. A recent chest CT conducted 10/24/2020 notes a progression in the interstitial changes. A 9 mm right lower lobe nodule is also noted. The plan is to have a 62-monthfollow-up chest CT conducted 02/04/2021. A 6-minute walk test conducted today shows oxygen desaturation and need for supplemental oxygen. I will place the order for supplemental oxygen. [facility].   Patient at rest on RA with baseline sats : 97% After exertion for 3 min on RA pt's sats were : 86% Patient placed on oxygen via nasal cannula at 2 lpm flow via portable oxygen concentrator device Exertion for 6 mins with oxygen confirmed final sats at 94%   #1 lung nodules Chest CT 10/24/2020 shows 9 mm right lower lobe lung nodule Follow-up chest CT scheduled for 340-montheevaluation in January 2023  2. Interstitial lung disease Chest CT 11/04/2020 notes fibrotic interstitial lung disease with evidence of progression compared to 08/28/2020 [facility] and [facility]   #Primary Care dec 2022  Osteopenia Due for DXA. Prostate cancer Saw Dr. Louis Dean at [facility]. Biopsy + for cancer. They are just watching it. He is going to have MRI and another biopsy next visit.   OV 01/24/2021 -evaluation and transfer of care to Dr. Chase Dean in ILD center in [place].  Referred by Dustin Dean patient support group leader for the pulmonary fibrosis foundation support group  Subjective:  Patient ID: Dustin Dean, male , DOB: May 25, 1950 , age 53 y.o. , MRN: [id number] , ADDRESS: [place] [id number] PCP Dustin Boers, MD Patient Care Team: Dustin Boers, MD as PCP - General (Family Medicine)  This Provider for this visit: Treatment Team:  Attending Provider: Brand Males, MD    01/24/2021 -   Chief Complaint  Patient presents with   Consult    Pt is here for a switch of providers to take over care for his pulmonary fibrosis. 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 Pt does become SOB with activities and also states that he does have a chronic cough.     HPI  Dustin Dean 72 y.o. -new evaluation for interstitial lung disease.  History is provided by review of the chart and also talking to the patient and his wife.  He tells me that he has had chronic cough since his teenage years.  But for the last few to several years its been more consistent.  He was seeing the physician assistant at [facility].  Somewhere along the way he got referred to Dr. Dennison Dean right at [facility] some 3 years ago.  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  After that cough is largely improved except for occasional exacerbations in the fall in the winter.  Then in 2018 sometime after he went on gabapentin he was informed by the physician assistant that he might have early ILD.  He does not recollect any serology or biopsy being done.  He does recollect a few pulmonary function test.  Then all of a sudden starting September 2022 his cough "got out of hand" and also developed worsening shortness of breath.  This the first time he started developing worsening shortness of breath.  Then by November 2022 started needing exertional oxygen leading to current symptoms.  He was referred to [facility] but he preferred to establish with the ILD  center here in [place] with [place] based on [facility] recommendation.  He found Dustin Dean through Internet search for pulmonary fibrosis foundation.  His current symptom score is below.  He is not on any antifibrotic.  [place] Integrated Comprehensive ILD Questionnaire  Symptoms:  SYMPTOM SCALE - ILD 01/24/2021  Current weight   O2 use 2L [place] with ex  Shortness of Breath 0 -> 5 scale with 5 being worst (score 6 If unable to do)  At rest 0  Simple tasks - showers, clothes change, eating, shaving 1  Household (dishes, doing bed, laundry) 1  Shopping 2  Walking level at own pace 5  Walking up Stairs 5  Total (30-36) Dyspnea Score 14  How bad is your cough? 2  How bad is your fatigue 1  How bad is nausea 00000  How bad is vomiting?  0  How bad is diarrhea? 0  How bad is anxiety? 0  How bad is depression 0  Any chronic pain - if so where and how bad 0       Past Medical History :  -  He has obesity BMI 33-34 - Denies any collagen vascular disease - He is known to have aortic stenosis -sees cardiologist at [facility].  December 2021 echocardiogram EF 55 to 60% with tricuspid aortic valve and moderate stenosis normal right ventricle -Denies any asthma or COPD.  Denies any heart failure denies any collagen vascular disease -Denies snoring or excessive daytime somnolence.  Denies stroke.  Denies formal diagnosis of sleep apnea denies formal diagnosis of pulmonary hypertension [echo a year ago did not show pulmonary hypertension] -Has had vaccination against COVID but has not had COVID disease -Recent diagnosis of late 2022 early stage prostate cancer at [facility]rology on observation treatment -Has history of skin cancer for which he apply sunscreen regularly  ROS:  -Shortness of breath present Cough chronic plus -Has intermittent chronic diarrhea and takes Imodium but no other GI side effects -Possible Raynaud's for the last several decades  FAMILY HISTORY of LUNG DISEASE:   Denies family for pulmonary fibrosis.  Denies family Struve COPD or asthma sarcoid or cystic fibrosis.  Denies family history of hypersensitive pneumonitis.  Denies autoimmune disease.  Denies any premature graying of the hair or albinism  PERSONAL EXPOSURE HISTORY:  No prior cigarette smoking -No marijuana use of cocaine use no intravenous drug use.  HOME  EXPOSURE and HOBBY DETAILS :  -Single-family home in a one-story space.  [place] setting age of the current home is 31 years.  He is lived there for 6 years.  He does use a feather pillow and this occasionally mold or mildew in the bathroom.  All his life he is done active gardening with mulch and woodchips and occasionally damp soil.  He is worked out in the yard quite a bit and does gardening.  He enjoys the gardening.  OCCUPATIONAL HISTORY (122 questions) : -He worked in the [employer] at [employer] and then [employer] no [employer].  He does not remember if the buildings had mold in it.  He does not think so -Detail greater than 100 question organic and inorganic antigen history exposure is negative  PULMONARY TOXICITY HISTORY (27 items):  Denies  INVESTIGATIONS: Report of pulmonary function test but I am not able to get it in Care Everywhere     Simple office walk 185 feet x  3 laps goal with forehead probe 01/24/2021    O2 used Ra at rest, desat at 1 lap and then walked with 2L piulse   Number laps completed 1 lap on RA, then 3 laps on 2L   Comments about pace x   Resting Pulse Ox/HR 96% and 88/min at 1 lap   Final Pulse Ox/HR 92% and 115/min on 3 laps on 2L pulsed   Desaturated </= 88% no   Desaturated <= 3% points yes   Got Tachycardic >/= 90/min yes   Symptoms at end of test Modreate dyspnea   Miscellaneous comments Corrected with 2L [place]      High-resolution CT scan of the chest August 2020  -There is a report of honeycombing but without any craniocaudal gradient.  Air-trapping reported  alternative pattern more suggestive of chronic hypersensitive pneumonitis.  But there is already progression from August 2019 -Read by Dr. Vinnie Langton   CT Chest data- HRCT 11/04/20 unable for my visualization.   CLINICAL DATA:  Worsening shortness of breath   EXAM:  CT CHEST WITHOUT CONTRAST   TECHNIQUE:  Multidetector CT imaging of the chest was performed following the  standard protocol  without intravenous contrast. High resolution  imaging of the lungs, as well as inspiratory and expiratory imaging,  was performed.   COMPARISON:  Chest CT dated August 29, 2018   FINDINGS:  Cardiovascular: Normal heart size. No pericardial effusion. Coronary  artery calcifications of the circumflex. Aortic valve  calcifications. Minimal calcified plaque of the thoracic aorta.   Mediastinum/Nodes: Mildly enlarged mediastinal lymph nodes,  unchanged compared to prior exam and likely reactive. Esophagus and  thyroid are unremarkable.   Lungs/Pleura: Central airways are patent. Bilateral air trapping.  Peribronchovascular and subpleural reticular opacities with traction  bronchiectasis and no clear craniocaudal predominance. Honeycomb  change primarily seen in the anterior upper lobes is increased when  compared to prior exam. New linear nodular opacity of the right  lower lobe measuring up to 9 mm on series 4, image 210.   Upper Abdomen: No acute abnormality.   Musculoskeletal: No chest wall mass or suspicious bone lesions  identified.   IMPRESSION:  Fibrotic interstitial lung disease with evidence of progression when  compared with August 29, 2018 prior exam. Favor fibrotic  hypersensitivity pneumonitis given presence of air trapping.  Findings are suggestive of an alternative diagnosis (not UIP) per  consensus guidelines: Diagnosis of Idiopathic Pulmonary Fibrosis: An  Official ATS/ERS/JRS/ALAT Clinical Practice Guideline. [place], Iss 5, [phone], Sep 15 2016.   New linear nodular opacity of the right lower lobe measuring up to 9  mm, likely an area of focal fibrosis. Recommend follow-up chest CT  in 3 months to ensure stability.   Aortic Atherosclerosis (ICD10-I70.0).   Addendum 01/26/2021 -radiology Dr. Weber Cooks wrote back saying CT scan is classic for chronic HP.  We will call the patient and get the patient started on nintedanib counseling which is first-line for progressive non-- IPF ILD.  If he is reluctant then we will do pirfenidone.  We will set up pharmacy counseling.  If he is reluctant for nintedanib based on risk versus benefit we will do pirfenidone  Electronically Signed    By: Yetta Glassman M.D.    On: 11/04/2020 14:17  No results found.   Phone visit 01/28/21  Emilyh  LEt Dustin Dean know ahead of the visit followup with me on 02/23/21   A) making referral to [facility] to discuss ofev which would be first line baed on what radiologist told me about CT and fact rheumaotid facot is positive  B) rheumatoid factor strongly positive - refer rheumatology Dr Deveshwar/Rice  C) I reviewed echo report (below) Will discuss at followup  Aortic Valve: There is moderate stenosis, with peak and mean gradients  of 44.000 and 27.000 mmHg.  Aortic valve area calculates to approximately  1.4 cm    Mitral Valve: There is mild regurgitation.    Tricuspid Valve: There is mild regurgitation.    Tricuspid Valve: The right ventricular systolic pressure is normal (<36  mmHg).   1.  Adequate 2D M-mode and color flow Doppler echocardiogram demonstrates  normal left ventricular chamber size and contractility.  There may be mild  left ventricular hypertrophy.  Ejection fraction is estimated 65%  2.  The aortic valve is thickened and demonstrates reduced leaflet  mobility.  There is mild to moderate aortic stenosis with aortic valve  area calculating to 1.4 cm  3.  Mild mitral annular calcification is noted but there is good leaflet   mobility.  Mild mitral insufficiency is noted  4.  Tricuspid valve appears to  be normal  5.  The atria are grossly normal size bilaterally  6.  The right ventricular chamber size and function is normal  6.  The pericardium is of normal thickness there is no effusion  OV 02/23/2021  Subjective:  Patient ID: Dustin Dean, male , DOB: 1950/07/29 , age 53 y.o. , MRN: [id number] , ADDRESS: [place] [id number] PCP Dustin Boers, MD Patient Care Team: Dustin Boers, MD as PCP - General (Family Medicine)  This Provider for this visit: Treatment Team:  Attending Provider: Brand Males, MD    02/23/2021 -   Chief Complaint  Patient presents with   Follow-up    Pt recently had a HRCT and PFT and is here today to discuss the results.   Chronic cough on gabapentin  Interstitial lung disease work-up in progress -concern for hypersensitive pneumonitis  -Progressive phenotype between August 2020 and October 2022  9 mm right lower lobe nodule seen in October 2022 for the first time: Stable January 2023  HPI Dustin Dean 72 y.o. -since his last visit Dr. Weber Cooks the radiologist did look at the CT scan and this confirmed his CT is not consistent with UIP but more likely an alternate pattern consistent with hypersensitive pneumonitis as seen by air trapping.  Patient has had serology work-up in which it is all negative except rheumatoid factor strongly positive.  He is yet to see the rheumatologist.  He does have significant restrictions on the PFTs with reduced DLCO consistent with his obesity and ILD.  His symptoms are overall stable.  Did go through his exposure history again.  He says in 200 07/2006 and again in 2012 he had had 3 episodes of pneumonia.  All of this happened when he entered school buildings.  He was living in the same house at that time but the house itself was Dustin-new and there is no mold or mildew.  He is wondering about crawlspace because only [place] he has seen crawlspace but he said he has never been in the crawl space.  He does not know if there was mold in the crawl space.  I told him it is doubtful that mold or mildew from the crawlspace can get into the house.  He did have a feather pillow but since his last visit he is thrown that out.  He does do gardening and does get exposed to dampness.  At this point in time did discuss with him that he does have ILD and it is progressive.  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  Unclear if the positive rheumatoid factor is playing a role it could be if he has positive for rheumatoid arthritis.  Did indicate that ultimately only a biopsy can put to rest if he has IPF [being Caucasian gentleman greater than 65 is classic phenotype for IPF] but did indicate to him that given progression does not matter whether it is IPF or non-IPF antifibrotic's is indicated.  He is already met with the pharmacist and discussed the 2 antifibrotic's.  Did indicate to him that nintedanib is first-line and non-- IPF progressive phenotype.  Did discuss about the diarrhea.  Denies any contraindication.  Therefore we will start this.  Did discuss whether diarrhea is willing to go through this.  Also discussed about ILD-Pro registry.  He is interested given the consent form.  Did indicate to him that it probably would be beneficial to do some kind of a limited work-up in differentiating his  etiology.  The reasons would be to participate in clinical trials but also in case he needs immunosuppression such as prednisone or CellCept [indicated for hypersensitive pneumonitis but not for IPF] having a specific diagnosis would be beneficial.  I did think that his obesity puts him at some risk for getting surgical lung biopsy and therefore it might be better to start off with bronchoscopy with lavage and transbronchial biopsies.  We can address this in the future.  This visit was just focused on  therapies.  Regarding his 9 mm lower lobe nodule: He had a CT scan of the chest and shows this is stable from October 2022 through January 2023.  The CT scan again reads as alternative pattern consistent with chronic HP.    CT Chest data January 2023  No results found.  Mediastinum/Nodes: Multiple prominent borderline but nonenlarged mediastinal and bilateral hilar lymph nodes, similar to the prior study esophagus is unremarkable in appearance. No axillary lymphadenopathy.   Lungs/Pleura: High-resolution images again demonstrate widespread but patchy areas of ground-glass attenuation, septal thickening, subpleural reticulation, thickening of the peribronchovascular interstitium, cylindrical and varicose traction bronchiectasis, peripheral bronchiolectasis and extensive honeycombing. There is no discernible craniocaudal gradient. The most extensive areas of honeycombing are anterior lung, predominantly in the upper lobes and right middle lobe. Some areas of the lung bases demonstrate relative sparing. Inspiratory and expiratory imaging demonstrates mild to moderate air trapping indicative of small airways disease. No definite progression compared to the recent prior study. No acute consolidative airspace disease. No pleural effusions. Previously described nodular area of architectural distortion in the right lower lobe (axial image 106 of series 8) is stable measuring 8 mm on today's examination, likely part of the underlying fibrosis. No other definite suspicious appearing pulmonary nodules or masses are noted.   Upper Abdomen: Unremarkable.   Musculoskeletal: There are no aggressive appearing lytic or blastic lesions noted in the visualized portions of the skeleton.   IMPRESSION: 1. 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 2. Previously noted nodular area of architectural distortion in the right lower lobe is unchanged, likely part of the fibrosis rather than a pulmonary nodule. 3. There are calcifications of the aortic valve and mitral annulus. Echocardiographic correlation for evaluation of potential valvular dysfunction may be warranted if clinically indicated. 4. Aortic atherosclerosis, in addition to 2 vessel coronary artery disease. Please note that although the presence of coronary artery calcium documents the presence of coronary artery disease, the severity of this disease and any potential stenosis cannot be assessed on this non-gated CT examination. Assessment for potential risk factor modification, dietary therapy or pharmacologic therapy may be warranted, if clinically indicated.   Aortic Atherosclerosis (ICD10-I70.0).     Electronically Signed   By: Vinnie Langton M.D.   On: 02/14/2021 06:17 stable January 2023  PFT       Latest Reference Range & Units 01/27/21 15:53 01/27/21 15:54  Anti Nuclear Antibody (ANA) NEGATIVE   NEGATIVE  Angiotensin-Converting Enzyme 9 - 67 U/L  49  Anti JO-1 0.0 - 0.9 AI  <0.2  Cyclic Citrullin Peptide Ab UNITS  <16  ds DNA Ab IU/mL  1  ENA RNP Ab 0.0 - 0.9 AI  0.4  RA Latex Turbid. <14 IU/mL  323 (H)  SSA (Ro) (ENA) Antibody, IgG <  1.0 NEG AI  <1.0 NEG  SSB (La) (ENA) Antibody, IgG <1.0 NEG AI  <1.0 NEG  Scleroderma (Scl-70) (ENA) Antibody, IgG <1.0 NEG AI  <1.0 NEG  QUANTIFERON-TB GOLD PLUS   Rpt  A.Fumigatus #1 Abs Negative  Negative   Micropolyspora faeni, IgG Negative  Negative   Thermoactinomyces vulgaris, IgG Negative  Negative   A. Pullulans Abs Negative  Negative   Thermoact. Saccharii Negative  Negative   Pigeon Serum Abs Negative  Negative   (H): Data is abnormally high     OV 03/23/2021- acute  video visit due to concerns for acute symptoms ? Ofev side effect  Subjective:  Patient ID: Dustin Dean, male , DOB: 1950-02-20 , age 71 y.o. , MRN: [id number] , ADDRESS: [place] [id number] PCP Dustin Boers, MD Patient Care Team: Dustin Boers, MD as PCP - General (Family Medicine)  This Provider for this visit: Treatment Team:  Attending Provider: Brand Males, MD  Type of visit: Video Circumstance: COVID-19 national emergency Identification of patient Dustin Dean with 1950-12-15 and MRN [id number] - 2 person identifier Risks: Risks, benefits, limitations of telephone visit explained. Patient understood and verbalized agreement to proceed Anyone else on call: just patient Patient location: Inpatient bed at [facility] which is another health system This provider location: [address] pulmonary office [address]  [place], [place] [address].   03/23/2021 -in this video visit was set up on acute basis because he started having symptoms after starting nintedanib.  He wanted to know if it was nintedanib symptoms.  The symptoms sound rather complex and little bit out of proportion for nintedanib.  Therefore I scheduled this video visit and to my surprise he is sitting in a hospital bed at [facility] hospital.    HPI Dustin Dean 72 y.o. - started ofev mid-feb 2023. Started noticing feeling disoriented and at times weak. Then on 3/4-03/19/21 was feeling very weak whole bidy weakness and needed walker to even go across room. Tired. Stopped going to gym. Was using o2 all the time. Then on 03/21/21 feelign cold. ANd ahd fever  102F.  Does not remember Tuesday much. Initialyt Dx is pneumonia and testing/imaging -> by tthen pccm saw him Dr Dan Europe. Procal negative. WBC normal. Concerns is ILD flare up and recommendation is prednisone.  CT scan did not show pneumonia Curently on IV solumedrol. Currently some better. Yesterday ate well. Currently on  2.5LN . He has been covid negative. Multiplex resp virus panel negative per outside chart revie  With ofev was having some nausea. No abd pain. No diarrhea. No stomach cramps. No vomitting   Currently ofev on hold    03/28/2021 Follow up : ILD , Post hospital follow up  Patient returns for a 1 week follow-up visit.  Patient was seen in January 2023 for second opinion for interstitial lung disease.  Patient was felt to have possible concern for hypersensitivity pneumonitis.  He has had a progressive phenotype noted on CT scan from August 2020 to October 2022.  Serology was negative except for a strongly positive rheumatoid factor.  He was referred to rheumatology.  PFT showed significant restriction and reduced DLCO.  Has a history of recurrent pneumonia.  He was recommended to begin antifibrotic's. With OFEV.  Patient was hospitalized last week with severe dyspnea, weakness, activity intolerance to point he could not walk. Confusion, low oxygen levels, and fever-tmax 102. Kristeen Miss he has a viral illness and ILD flare.  Patient was hospitalized at an outlying hospital.  He was treated with steroids.  Discharged on a steroid taper. CT chest 03/22/21 report showed fibrotic ILD, stable since 10/2020, no acute consolidation.  Covid , Influezna  and Viral panel were neg.  Since getting out of the hospital patient is feeling much better. Energy level has picked, decreased oxygen demands, no fever. Appetite is some better . Patient is concerned this could have been from [facility] as his symptoms started when he started OFEV.  Is on Oxygen with activity and At bedtime  2l/m . At rest O2 sats are >90%.      MDD 03/28/21 MDD Impression/Recs: Recent flare up. Bx can be prohibitive. get rheum eval and if neg - manage as HP. Maybe at the most BAL if stable  OV 04/05/2021  Subjective:  Patient ID: Dustin Dean, male , DOB: Mar 16, 1950 , age 10 y.o. , MRN: [id number] , ADDRESS: [place]  [id number] PCP Dustin Boers, MD Patient Care Team: Dustin Boers, MD as PCP - General (Family Medicine)  This Provider for this visit: Treatment Team:  Attending Provider: Brand Males, MD    04/05/2021 -   Chief Complaint  Patient presents with   Follow-up    Pt states he is feeling better after last hospital stay and states each day is getting better.   Chronic cough on gabapentin  Interstitial lung disease work-up in progress -concern for hypersensitive pneumonitis  -Progressive phenotype between August 2020 and October 2022   - started ofev mid feb 2023 -> early march 2023 admitted for ILD flare  9 mm right lower lobe nodule seen in October 2022 for the first time: Stable January 2023  HPI Dustin Dean 72 y.o. -returns for followup. He is on 1.5 tab of pred curently . Next week is 1 tab pred and off. He is better. Feeling better. AT rest not using o2 because he is better. In fact symp score shows huge imrpovement. Walking desat test is also largey imprpvd. All ? Steroid effects. We had conversation about his recent flare up  - has hx of recurrent pna: 2005-2006 x 2 episodes. . Lived in a different house. Then again pneumonia 5 years ago in same house. Rx in urgent care. Had high fever. Current started abruptly 2 weeks into ofev andhe wondering if due to ofev. Did not have classic Ofev sid effects. Mentioned to him Ofev reduces riks of flare.  - gardening: no longer gardening  - featherpillow - got rid of it even after last visit   -visible mold - denies but he discussed his crawl space. Says "there is no mold bu fungs on the top wall". 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 Biopsy indicated if stable but first rheum consult    04/26/2021- Interim hx  Patient presents today for ILD follow-up. He has been on OFEV for three weeks, currently taking 137m twice daily. He has been off oral prednisone for two weeks. Breathing is  not significantly worse off steroids. Cough is the same. Since Saturday he developed chills, fatigue and low grade fever at night. Temp 100.1-100.3. He reprots some heavy breathing. Oxygen 92% or higher. Urinary frequency. Having some incontinence. Continues on Gabapentin for chronic cough. Referred to rheumatology, has an apt beginning of June. He has been avoid exposures mold/mildew, gardening.     05/02/2021 Follow up : ILD , Fever  Patient presents for  an acute office visit.  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  Serial CT chest from August 2020 to August 2022 showed progressive changes.  Serology was negative except for a positive rheumatoid factor.  He has been referred to rheumatology with consult pending.  Pulmonary function testing showed significant restriction and decreased DLCO.  Patient started Ofev on March 01, 2021.  Patient was admitted early March with severe weakness dyspnea and activity intolerance.  He also had intermittent confusion hypoxemia and a fever Tmax of 102.  He was felt to have an ILD flare with possible viral illness however COVID, influenza and viral panel were all negative.  He was treated with steroids.  CT chest showed fibrotic ILD stable since October 2022.  His Ofev was held during admission.  After discharge he said that his energy level was improved and had decreased oxygen demands.  Based on oxygen after discharge was oxygen 2 L with activity and at bedtime.  Not requiring oxygen at rest with O2 saturations greater than 90%.  Patient was restarted on Ofev April 05, 2020.  After 2 weeks of been on Ofev patient developed low-grade fever.  This is waxed and waned and yesterday developed a fever Tmax of 103.  Patient went to the emergency room work-up was unrevealing.  CT chest showed stable ILD with no acute process.  CBC was normal blood cultures are no growth to date.   Respiratory viral panel was negative.  Lactic acid was 1.5 urinalysis was normal, urine culture is pending.  LFTs were slightly elevated.  Since last night patient says he is feeling better. Fever is resolved. Took Tylenol . O2 saturations are 100%.  Has some sore throat that started late night .  Had good appetite today . No fever today . No increased Oxygen demands.  Wife is [name].  Patient denies any rash, skin lesions, skin redness, joint swelling, insect or tick bite, travel, discolored mucus, abdominal pain, nausea vomiting or diarrhea. Has no previous joint replacement or surgeries. Abdominal hernia repair >34yr ago. No abd pain,  [facility]  2D echo January 25, 2018 3 aortic valve is thickened with mild to moderate aortic valve stenosis.,  EF 55 to 60%.  Right ventricle is normal systolic function is normal.     OV 05/19/2021  Subjective:  Patient ID: Dustin Dean male , DOB: 5August 15, 1952, age 72y.o. , MRN: [id number], AD[address]ESS: [id number] BJoneen Boers MD Patient Care Team: BJoneen Boers MD as PCP - General (Family Medicine)  This Provider for this visit: Treatment Team:  Attending Provider: RBrand Males MD    05/19/2021 -   Chief Complaint  Patient presents with   Follow-up    PFT performed today.  Pt states since stopping the OFEV, he has not had anymore fever and states overall he has felt good.    Chronic cough on gabapentin  - well controlled  Interstitial lung disease work-up in progress -concern for hypersensitive pneumonitis  -Progressive phenotype between August 2020 and October 2022   - started ofev mid feb 2023 -> early march 2023 admitted for ILD flare ->   9 mm right lower lobe nodule seen in October 2022 for the first time: Stable January 2023  HPI KMichaeljohn Biss89y.o. -returns with wife [name].  He tells me that after last visit he did a rechallenge with nintedanib and he  picked up fever and  ended up in the emergency room very similar to March 2023 hospitalization.  Symptoms started 1-2 weeks into taking nintedanib.  His wife feels strongly this is nintedanib related.  Have not seen the patient gets significant side effects from nintedanib but have seen one of the patient gets something similar with pirfenidone.  That particular patient had even after 1 dose.  I think the strength of evidence here is that this systemic inflammatory response is probably related to nintedanib.  Therefore he has stopped it.  I have advised him against taking nintedanib ever again.  Clinically he is feeling stable.  His walking desaturation test is stable.  His pulmonary function test is also stable.  He uses 2 L with exertion but today in office when we walked him on room air he dropped 7 points and this is similar to before.  He wants to try pirfenidone.  We have discussed this in the past.  We also discussed prednisone but he does not want to do prednisone because of hyperglycemia.  We discussed the risk, benefits and limitations of pirfenidone.  Pirfenidone is not well studied in chronic HP.  Is only studied in 1 subtype of non-IPF progressive phenotype.  Nevertheless is the only other antifibrotic option available.  He wants to try a donor sample.  We will give him 2 months worth.  He is traveling to the beach  once he comes back he will take it    07/03/2021 Follow up : ILD  Patient returns for a 6-week follow-up.  Patient has underlying interstitial lung disease with progressive phenotype.  Serial CT chest from August 20 to August 2022 showed progressive changes.  Serology was negative except for positive rheumatoid factor.  Rheumatology consult is pending.  Previous PFTs have showed significant restriction and decreased diffusing capacity.  Patient was started on Ofev March 01, 2021.  Patient had a SIRS like response to OFEV x 2 .  Ofev has been stopped indefinitely and advised not to restart.  Patient was  placed on a drug holiday.  Started on Esbriet 1 month ago.  Patient says since starting Esbriet he seems to be tolerating very well.  LFTs prior to start had returned back to normal.  Previously were elevated.  Patient says he has had minimal nausea.  No diarrhea no upset stomach and appetite seems to be normal.  Patient says he has been using sunscreen when he is out side.  Has had no rash.  We discussed returning for ongoing labs. Patient has been referred to pulmonary rehab but has not contacted today. Patient says overall he is doing okay.  Wears his oxygen with activity as needed.  And at bedtime.  Has had no increased oxygen demands. Patient says he still gets short of breath with heavy activities.  But feels like he is stable at this point.  Has had no return of fever.      OV 09/08/2021  Subjective:  Patient ID: Dustin Dean, male , DOB: 08-31-1950 , age 61 y.o. , MRN: [id number] , ADDRESS: [address] [place] [id number] PCP Dustin Boers, MD Patient Care Team: Dustin Boers, MD as PCP - General (Family Medicine)  This Provider for this visit: Treatment Team:  Attending Provider: Brand Males, MD    09/08/2021 -   Chief Complaint  Patient presents with   Follow-up    Pt states he has been doing okay since last visit and denies any complaints.  Chronic cough on gabapentin  - well controlled  Interstitial lung disease work-up in progress -concern for hypersensitive pneumonitis  -Progressive phenotype between August 2020 and October 2022   - started ofev mid feb 2023 -> early march 2023 admitted for ILD flare -> rechallenged ofev April 2023 -> ER vist SIRS   - SAE with OFev/ Ofev stopped  - started Pirfenioned May 2023 (rjected prednisone due to side effec tprofile)  9 mm right lower lobe nodule seen in October 2022 for the first time: Stable January 202  HPI Dustin Dean 72 y.o. -returns for follow-up.  He tells me that he is lost more weight.  He is  stable.  His symptom score shows good stability.  He is tolerating pirfenidone really well.  He has lost weight.  Current BMI is 31 but it is all intentional with the help of exercise, pirfenidone and also eating less calories.  No side effects from pirfenidone no sunburn nothing.  Is taking 3 to 2 pills 3 times daily.  He is willing to roll over to 1 big pill 3 times daily.  His main questions were -He wants to do some gardening such as cut wine but he assures me that will not be any exposure to organic dust.  -He wants to donate his body after death to medical school.  I advised him that either [facility] or [facility] is fine.  -He also has aortic stenosis.  He saw cardiology today at [facility].  He saw Dr. Loni Beckwith.  He is being referred to surgery at [place] for TAVR consideration.  According to the discussion [not clearly apparent in the chart review] TAVR is being recommended early because of his pulmonary fibrosis.  I did explain to him that in advance pulmonary fibrosis surgical risk is high but did want him to ask the surgeon if there is any downside to operating or placing TAVR procedure when the aortic stenosis is not critical.  PFT   OV 11/17/2021  Subjective:  Patient ID: Dustin Dean, male , DOB: 05/01/1950 , age 89 y.o. , MRN: [id number] , AD[address]ESS: [address] [place] [id number] PCP Dustin Boers, MD Patient Care Team: Dustin Boers, MD as PCP - General (Family Medicine)  This Provider for this visit: Treatment Team:  Attending Provider: Brand Males, MD    11/17/2021 -   Chief Complaint  Patient presents with   Follow-up    PFT performed today.  Pt states he has multiple concerns to discuss.     HPI Dustin Dean 72 y.o. -returns for follow-up.  He is doing well overall.  However for the last 3 days he has had a runny nose and with that he is having decreased energy.  He is got decreased social interest.  He also feels  his memory is failing him more in the last 3 days he has noted some memory changes for the last several months but it is a lot worse in the last 3 days since the runny nose.  His COVID is negative but there is no fever brown sputum or yellow sputum.  There is no significant drainage.  There is no worsening shortness of breath there is no worsening cough there is no chills.  In addition he has new complaint of tender itchy scalp for the last 3 weeks there is no rash but he has an appointment with dermatologist because his scalp is dry and flaky.  He is not doing gardening  but he does go out occasionally in the sun and he does not wear sunscreen or hat.  He is on pirfenidone which can cause skin rash and especially in the setting of sun exposure.  I did caution him this could be because of pirfenidone but he is going to see a dermatologist anyways.  Outside of this he is tolerating his pirfenidone well.  I did indicate to him he can roll himself into 1 big pill 3 times daily instead of 3 small pills 3 times daily for a total of 9 pills.  He also told me that he is at risk for depression.  He says in the past he has had significant depression he is on antidepressant.  He is worried this can come back with his ILD.  We talked about seeing Elias Else clinical psychologist who specializes in medical issues he is willing to see him.  Also discussed about doing light therapy  Preoperative evaluation: He has aortic stenosis.  He is saying now that it could be critical.  Apparently TAVR procedure is being planned date unknown.  Currently because of the cold he has suspended pulmonary rehabilitation he wants to know if he can go back.  I did tell him we need to get clearance from the cardiologist because of critical aortic stenosis history I thought it was moderate in the past.          OV 02/25/2022  Subjective:  Patient ID: Dustin Dean, male , DOB: [birth year] , age 54 y.o. , MRN: [id number] , ADDRESS:  [place] [id number] PCP Dustin Boers, MD Patient Care Team: Dustin Boers, MD as PCP - General (Family Medicine)  This Provider for this visit: Treatment Team:  Attending Provider: Brand Males, MD    02/25/2022 -  No chief complaint on file.    HPI Dustin Dean 72 y.o. -      SYMPTOM SCALE - ILD 01/24/2021 02/23/2021   04/05/2021 Post hopsilaization. On pred taper and off ofev Ofev 150 twice daily; No prednsone  05/19/2021 214# 11/17/2021   Current weight         esbiret  O2 use 2L [place] with ex 2L with ex 2L Wth ex at hoe 2L    Shortness of Breath 0 -> 5 scale with 5 being worst (score 6 If unable to do)         At rest 0 0 0 1 0 0  Simple tasks - showers, clothes change, eating, shaving 1 1 0.'5 2 1 1  '$ Household (dishes, doing bed, laundry) '1 1 1 2 2 1  '$ Shopping 2 1 0 NA 1 1  Walking level at own pace 5 2 0 1 (with oxygen) 1 2  Walking up Stairs '5 3 1 '$ with o2, 6 without o2 2 (with oxygen) 2 3  Total (30-36) Dyspnea Score '14 8   8 7 8  '$ How bad is your cough? 2 3 0 with gbapentin 1-2 (on gabapentin) 2 3  How bad is your fatigue 1 2 0 '4 1 4  '$ How bad is nausea 00000 0 0 0 0 0  How bad is vomiting?   0 0 0 0 0 0  How bad is diarrhea? 0 0 0 0 0 0  How bad is anxiety? 0 0 0 0 0 2  How bad is depression 0 0 0 0 (on antidepressant) 1 2  Any chronic pain - if so where and how bad 0 0 0 0  0      Simple office walk 185 feet x  3 laps goal with forehead probe 01/24/2021  04/05/2021 On prendisone. Off ofev 05/19/2021 Off ofev 09/08/2021 On esbiret since may 2023  O2 used Ra at rest, desat at 1 lap and then walked with 2L piulse Ra - being on room air for 15 min and in mddile of pred burst taper    Number laps completed 1 lap on RA, then 3 laps on 2L Did all 3 laps    Comments about pace x avg  avg  Resting Pulse Ox/HR 96% and 88/min at 1 lap 98% an Hr 92 98% AND HR 81 98% and HR 77  Final Pulse Ox/HR 92% and 115/min on 3 laps on 2L pulsed 92% and HR 117 91%  and HR 115 92% and HR 113  Desaturated </= 88% no no no no  Desaturated <= 3% points yes Yes, 6  Yes 7  Yes 6 pots  Got Tachycardic >/= 90/min yes yes yes avg  Symptoms at end of test Modreate dyspnea Modate dyspnea Mild dyspnea Mild dyspnea  Miscellaneous comments Corrected with 2L [place] Did not need o2, improved     PFT     Latest Ref Rng & Units 11/17/2021    1:02 PM 05/19/2021   10:56 AM 02/21/2021    4:04 PM  PFT Results  FVC-Pre L 1.94  1.95  1.82   FVC-Predicted Pre % 46  46  43   FVC-Post L   1.95   FVC-Predicted Post %   46   Pre FEV1/FVC % % 88  90  88   Post FEV1/FCV % %   91   FEV1-Pre L 1.71  1.75  1.60   FEV1-Predicted Pre % 56  56  51   FEV1-Post L   1.76   DLCO uncorrected ml/min/mmHg 16.63  19.56  15.57   DLCO UNC% % 67  79  63   DLCO corrected ml/min/mmHg 16.87  19.56  15.57   DLCO COR %Predicted % 68  79  63   DLVA Predicted % 106  116  106   TLC L   3.76   TLC % Predicted %   56   RV % Predicted %   75        has a past medical history of Aortic stenosis, Flu, Pneumonia, Prostate cancer ([place]), Pulmonary fibrosis ([place]), and Skin cancer.   reports that he has never smoked. He has been exposed to tobacco smoke. He has never used smokeless tobacco.  Past Surgical History:  Procedure Laterality Date   HERNIA REPAIR     NASAL SEPTUM SURGERY     SKIN CANCER EXCISION      Allergies  Allergen Reactions   Bee Venom Rash   Shellfish Allergy Hives   Nintedanib Other (See Comments)    SIRS, fevers   Pseudoephedrine Anxiety    Immunization History  Administered Date(s) Administered   Fluad Quad(high Dose 65+) 10/23/2017, 09/30/2019, 10/14/2020, 10/18/2020   Influenza-Unspecified 11/11/2009, 11/08/2011, 11/03/2013, 11/22/2014, 11/10/2015   Moderna Covid-19 Vaccine Bivalent Booster 49yr & up 10/24/2020   Moderna Sars-Covid-2 Vaccination 03/19/2019, 04/16/2019, 11/16/2019, 05/07/2020   Pneumococcal Conjugate-13 12/08/2007, 12/21/2015, 09/04/2018    Pneumococcal Polysaccharide-23 12/08/2007, 06/21/2017, 06/16/2018   Tdap 12/08/2007, 11/27/2018   Zoster Recombinat (Shingrix) 10/30/2013, 09/04/2018, 11/27/2018   Zoster, Live 10/30/2013   Zoster, Unspecified 10/30/2013    Family History  Problem Relation Age of Onset   Cancer Mother  Congestive Heart Failure Father    Prostate cancer Father    Healthy Sister      Current Outpatient Medications:    acetaminophen (TYLENOL) 325 MG tablet, Take by mouth as needed., Disp: , Rfl:    aspirin-acetaminophen-caffeine (EXCEDRIN MIGRAINE) 250-250-65 MG tablet, Take by mouth as needed., Disp: , Rfl:    Calcium Carb-Cholecalciferol (CALCIUM/VITAMIN D PO), Take 1 tablet by mouth daily., Disp: , Rfl:    Cetirizine HCl (ZYRTEC PO), Take by mouth., Disp: , Rfl:    doxycycline (VIBRA-TABS) 100 MG tablet, Take 1 tablet (100 mg total) by mouth 2 (two) times daily., Disp: 14 tablet, Rfl: 0   fluorouracil (EFUDEX) 5 % cream, Apply topically., Disp: , Rfl:    fluticasone (FLONASE) 50 MCG/ACT nasal spray, Place into both nostrils daily., Disp: , Rfl:    gabapentin (NEURONTIN) 300 MG capsule, Take 300 mg by mouth 3 (three) times daily., Disp: , Rfl:    loperamide (IMODIUM) 2 MG capsule, Take 1 mg by mouth once., Disp: , Rfl:    meclizine (ANTIVERT) 25 MG tablet, Take 25 mg by mouth 2 (two) times daily as needed., Disp: , Rfl:    Multiple Vitamin (MULTIVITAMIN) capsule, Take 1 capsule by mouth every morning., Disp: , Rfl:    naproxen sodium (ALEVE) 220 MG tablet, Take 220 mg by mouth daily as needed., Disp: , Rfl:    omeprazole (PRILOSEC) 40 MG capsule, Take 40 mg by mouth daily., Disp: , Rfl:    PAROXETINE HCL PO, Take by mouth., Disp: , Rfl:    Pirfenidone (ESBRIET) 801 MG TABS, Take 801 mg by mouth 3 (three) times daily with meals., Disp: 270 tablet, Rfl: 1   rosuvastatin (CRESTOR) 5 MG tablet, Take 1 tablet by mouth daily., Disp: , Rfl:    SUMAtriptan (IMITREX) 100 MG tablet, TAKE ONE TABLET BY MOUTH  AT ONSET OF HEADACHE; MAY REPEAT ONE TABLET IN 2 HOURS IF NEEDED. MAX OF 200 MG PER DAY, Disp: , Rfl:       Objective:   There were no vitals filed for this visit.  Estimated body mass index is 30.71 kg/m as calculated from the following:   Height as of 01/09/22: '5\' 8"'$  (1.727 m).   Weight as of 01/09/22: 91.6 kg.  '@WEIGHTCHANGE'$ @  There were no vitals filed for this visit.   Physical Exam  General Appearance:    Alert, cooperative, no distress, appears stated age - *** , Deconditioned looking - *** , OBESE  - ***, Sitting on Wheelchair -  ***  Head:    Normocephalic, without obvious abnormality, atraumatic  Eyes:    PERRL, conjunctiva/corneas clear,  Ears:    Normal TM's and external ear canals, both ears  Nose:   Nares normal, septum midline, mucosa normal, no drainage    or sinus tenderness. OXYGEN ON  - *** . Patient is @ ***   Throat:   Lips, mucosa, and tongue normal; teeth and gums normal. Cyanosis on lips - ***  Neck:   Supple, symmetrical, trachea midline, no adenopathy;    thyroid:  no enlargement/tenderness/nodules; no carotid   bruit or JVD  Back:     Symmetric, no curvature, ROM normal, no CVA tenderness  Lungs:     Distress - *** , Wheeze ***, Barrell Chest - ***, Purse lip breathing - ***, Crackles - ***   Chest Wall:    No tenderness or deformity.    Heart:    Regular rate and rhythm, S1 and S2  normal, no rub   or gallop, Murmur - ***  Breast Exam:    NOT DONE  Abdomen:     Soft, non-tender, bowel sounds active all four quadrants,    no masses, no organomegaly. Visceral obesity - ***  Genitalia:   NOT DONE  Rectal:   NOT DONE  Extremities:   Extremities - normal, Has Cane - ***, Clubbing - ***, Edema - ***  Pulses:   2+ and symmetric all extremities  Skin:   Stigmata of Connective Tissue Disease - ***  Lymph nodes:   Cervical, supraclavicular, and axillary nodes normal  Psychiatric:  Neurologic:   Pleasant - ***, Anxious - ***, Flat affect - ***  CAm-ICU -  neg, Alert and Oriented x 3 - yes, Moves all 4s - yes, Speech - normal, Cognition - intact    General: No distress. *** Neuro: Alert and Oriented x 3. GCS 15. Speech normal Psych: Pleasant Resp:  Barrel Chest - ***.  Wheeze - ***, Crackles - ***, No overt respiratory distress CVS: Normal heart sounds. Murmurs - *** Ext: Stigmata of Connective Tissue Disease - *** HEENT: Normal upper airway. PEERL +. No post nasal drip        Assessment:     No diagnosis found.     Plan:     There are no Patient Instructions on file for this visit.    SIGNATURE    Dr. Brand Dean, M.D., F.C.C.P,  Pulmonary and Critical Care Medicine Staff Physician, [place] Director - Interstitial Lung Disease  Program  Pulmonary [place] [facility] [place], [place], [address]  Pager: [phone], If no answer or between  15:00h - 7:00h: call [phone] Telephone: [phone]  6:40 PM 02/25/2022

## 2022-02-25 NOTE — Patient Instructions (Signed)
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 I am concerned you have developed pulmonary hypertension and/or worsenign fibrosis. Possible that covid has flared things up with your fibrosis  -Glad you are tolerating pirfenidone really well   Plan (shared decision making) --Continue pirfenidone per protocol  -Pharmacy to consider rolling into 1 big pill 3 times daily  - take with food  - space 5-6 hours apart  - apply sunscreen  - wear hat  - hydrate well - refer to [facility] for PHINDER STUDY  - Right heart cath study  - if pulmonary hyperrtension present you will need TYVASO  - do HRCT supine and prone  = if fibrosis is worse discuss cellcept  V Rituxan +/- prednisone -Holding off on rheumatology evaluation for now -Okay to do judicious gardening as long as there is no exposure to organic dust andwear N95 mask  = keep pulse ox > 88%   Anxiety/depression  -Glad counseling with [employer] going well  Plan - per   Elias Else clinical psychologist at [facility]    Chronic cough  Noticed that gabapentin is really helping you  Plan - Okay to continue gabapentin   Aortic Stenosis - moderate  - glad you had TAVR and procdure went well  Plan  - per your primary cardiologist  -Obesity  - BMI 31.29 on 02/26/2022 and better  Plan - Please talk to primary care physician about weight loss drugs  Follow-up - FAce to face  see Dr. Chase Caller in 6  weeks but after pulmonary function test  -30-minute visit, simple walking desaturation test and symptom score at follow-up

## 2022-02-26 ENCOUNTER — Telehealth: Payer: Self-pay | Admitting: Internal Medicine

## 2022-02-26 ENCOUNTER — Ambulatory Visit (INDEPENDENT_AMBULATORY_CARE_PROVIDER_SITE_OTHER): Payer: Medicare Other | Admitting: Internal Medicine

## 2022-02-26 ENCOUNTER — Encounter: Payer: Self-pay | Admitting: Internal Medicine

## 2022-02-26 VITALS — BP 118/68 | HR 82 | Temp 98.0°F | Ht 68.0 in | Wt 205.8 lb

## 2022-02-26 DIAGNOSIS — J849 Interstitial pulmonary disease, unspecified: Secondary | ICD-10-CM

## 2022-02-26 LAB — PULMONARY FUNCTION TEST
DL/VA % pred: 98 %
DL/VA: 4.09 ml/min/mmHg/L
DLCO cor % pred: 54 %
DLCO cor: 12.62 ml/min/mmHg
DLCO unc % pred: 53 %
DLCO unc: 12.33 ml/min/mmHg
FEF 25-75 Pre: 2.73 L/sec
FEF2575-%Pred-Pre: 126 %
FEV1-%Pred-Pre: 63 %
FEV1-Pre: 1.78 L
FEV1FVC-%Pred-Pre: 119 %
FEV6-%Pred-Pre: 56 %
FEV6-Pre: 2.02 L
FEV6FVC-%Pred-Pre: 106 %
FVC-%Pred-Pre: 52 %
FVC-Pre: 2.02 L
Pre FEV1/FVC ratio: 88 %
Pre FEV6/FVC Ratio: 100 %

## 2022-02-26 NOTE — Telephone Encounter (Signed)
Dalton/Dan (cc Matt)   Dustin Dean Is someone I see for chronic ILD.  He is on antifibrotic pirfenidone.  In October 2023 he had right heart catheterization and left heart catheterization at [facility].  His pulmonary artery capillary wedge pressure was 11 and mean arterial pulmonary pressure was 27 mmHg.  He had severe aortic stenosis with a valve gradient of 40. mmHg. mid December 2023 he did have TAVR procedure at [facility].  Currently when I saw him some nearly 8 weeks after his TAVR he is actually got new onset exercise hypoxemia and his DLCO shows dramatic decline.  While suspect progressive pulmonary fibrosis I am wondering if he could have worsening pulmonary hypertension and if he should have a repeat right heart catheterization with intent to treat with treprostinil.  So question as in the setting of TAVR is a repeat right heart catheterization going to be any different from the one above in October 2023 before the TAVR?  If so we can try to get him a right heart catheterization through the [facility] research protocol.  He is interested in this research protocol [principal investigator Matt Hunsucker] and he is reading through the consent right now

## 2022-02-26 NOTE — Progress Notes (Signed)
Spirometry and DLCO completed today  

## 2022-02-27 NOTE — Telephone Encounter (Signed)
Hi Dalton  Please proceed with RHC as standard of care post TAVR esp given DLCO decline. HE does NOT qualify for PHINDER study due to recent RHC   Thanks    SIGNATURE    Dr. Brand Males, M.D., F.C.C.P,  Pulmonary and Critical Care Medicine Staff Physician, [place] Director - Interstitial Lung Disease  Program  Medical Director - [place] [facility] [place] at [place], [place], [address]   Pager: [phone], If no answer  -[place] or Try [phone] Telephone (clinical office): [phone] Telephone (research): [phone]  8:37 AM 02/27/2022

## 2022-02-28 ENCOUNTER — Ambulatory Visit (HOSPITAL_BASED_OUTPATIENT_CLINIC_OR_DEPARTMENT_OTHER)
Admission: RE | Admit: 2022-02-28 | Discharge: 2022-02-28 | Disposition: A | Discharge status: Home / Self Care | Payer: Medicare Other | Source: Ambulatory Visit | Attending: Internal Medicine | Admitting: Internal Medicine

## 2022-02-28 ENCOUNTER — Telehealth: Payer: Self-pay | Admitting: Internal Medicine

## 2022-02-28 DIAGNOSIS — J849 Interstitial pulmonary disease, unspecified: Secondary | ICD-10-CM | POA: Insufficient documentation

## 2022-03-01 ENCOUNTER — Telehealth: Payer: Self-pay | Admitting: Internal Medicine

## 2022-03-01 DIAGNOSIS — J849 Interstitial pulmonary disease, unspecified: Secondary | ICD-10-CM

## 2022-03-02 NOTE — Telephone Encounter (Signed)
[employer] is calling because they still have not received the order for the oxygen re-certification for the patient.  She stated it was requested before and it is now urgent that they get it sent.  Please advise and call to confirm.  CB# [phone]

## 2022-03-05 NOTE — Telephone Encounter (Signed)
Looked at patient's chart, O2 has never been ordered from our office before. Per patient's chart, he was last seen on 02/26/22 by MW. Walk test was done the same day and he needed 3L of O2.   MR, please advise if you are ok with placing the re-certification order. Thanks!

## 2022-03-07 ENCOUNTER — Ambulatory Visit (INDEPENDENT_AMBULATORY_CARE_PROVIDER_SITE_OTHER): Payer: Medicare Other | Admitting: Psychologist

## 2022-03-07 ENCOUNTER — Telehealth: Payer: Self-pay | Admitting: Internal Medicine

## 2022-03-07 DIAGNOSIS — F411 Generalized anxiety disorder: Secondary | ICD-10-CM

## 2022-03-07 DIAGNOSIS — F330 Major depressive disorder, recurrent, mild: Secondary | ICD-10-CM

## 2022-03-07 NOTE — Telephone Encounter (Signed)
[facility] called about getting paperwork from Dr. Penne Lash she re sent waiting for fax will stick in Ramaswamy's box when comes in with note and mrn

## 2022-03-07 NOTE — Progress Notes (Signed)
[place] Counselor/Therapist Progress Note  Patient ID: Dustin Dean, MRN: [id number],    Date: 03/07/2022  Time Spent: 11:07 am to 11:51 am; total time: 44 minutes   This session was held via in person. The patient consented to in-person therapy and was in the clinician's office. Limits of confidentiality were discussed with the patient.    Treatment Type: Individual Therapy  Reported Symptoms: Some concern related to end of life care  Mental Status Exam: Appearance:  Well Groomed     Behavior: Appropriate  Motor: Normal  Speech/Language:  Clear and Coherent  Affect: Appropriate  Mood: normal  Thought process: normal  Thought content:   WNL  Sensory/Perceptual disturbances:   WNL  Orientation: oriented to person, place, and time/date  Attention: Good  Concentration: Good  Memory: WNL  Fund of knowledge:  Good  Insight:   Good  Judgment:  Good  Impulse Control: Good   Risk Assessment: Danger to Self:  No Self-injurious Behavior: No Danger to Others: No Duty to Warn:no Physical Aggression / Violence:No  Access to Firearms a concern: No  Gang Involvement:No   Subjective: Beginning the session, patient described himself as well reflecting on upcoming events. From there, he stated that he has some anxiety related to knowing how much time he has left due to the need for "urgency to complete certain tasks". He processed thoughts and emotions. He was agreeable to homework and following up. He denied suicidal and homicidal ideation.    Interventions:  Worked on developing a therapeutic relationship with the patient using active listening and reflective statements. Provided emotional support using empathy and validation. Reviewed the treatment plan with the patient. Praised the patient for doing well and explored what has assisted the patient. Praised the patient for completing homework. Identified goals for the session. Normalized and validated thoughts and  emotions. Used socratic questions to assist the patient. Used a Product/process development scientist to assist the patient. Provided psychoeducation about the MOST form, health care decision maker, and other forms. Processed thoughts and emotions. Assisted in problem solving. Used examples to assist the patient gain insight. Provided empathic statements. Assigned homework. Assessed for suicidal and homicidal ideation.   Homework: Will look into MOST form and other forms to be completed for end of life care  Next Session: Review homework and emotional support  Diagnosis: F33.0 major depressive affective disorder, recurrent, mild and F41.1 generalized anxiety disorder  Plan:  Goals Work through the grieving process and face reality of own death Accept emotional support from others around them Live life to the fullest, event though time may be limited Become as knowledgeable about the medical condition  Reduce fear, anxiety about the health condition  Accept the illness Accept the role of psychological and behavioral factors  Stabilize anxiety level wile increasing ability to function Learn and implement coping skills that result in a reduction of anxiety  Alleviate depressive symptoms Recognize, accept, and cope with depressive feelings Develop healthy thinking patterns Develop healthy interpersonal relationships  Objectives target date for all objectives is 12/21/2022 Identify feelings associated with the illness Family members share with each other feelings Identify the losses or limitations that have been experienced Verbalize acceptance of the reality of the medical condition Commit to learning and implement a proactive approach to managing personal stresses Verbalize an understanding of the medical condition Work with therapist to develop a plan for coping with stress Learn and implement skills for managing stress Engage in social, productive activities that are possible Engage in  faith based  activities implement positive imagery Identify coping skills and sources of emotional support Patient's partner and family members verbalize their fears regarding severity of health condition Identify sources of emotional distress  Learning and implement calming skills to reduce overall anxiety Learn and implement problem solving strategies Identify and engage in pleasant activities Learning and implement personal and interpersonal skills to reduce anxiety and improve interpersonal relationships Learn to accept limitations in life and commit to tolerating, rather than avoiding, unpleasant emotions while accomplishing meaningful goals Identify major life conflicts from the past and present that form the basis for present anxiety Learn and implement behavioral strategies Verbalize an understanding and resolution of current interpersonal problems Learn and implement problem solving and decision making skills Learn and implement conflict resolution skills to resolve interpersonal problems Verbalize an understanding of healthy and unhealthy emotions verbalize insight into how past relationships may be influence current experiences with depression Use mindfulness and acceptance strategies and increase value based behavior  Increase hopeful statements about the future.   Interventions Teach about stress and ways to handle stress Assist the patient in developing a coping action plan for stressors Conduct skills based training for coping strategies Train problem focused skills Sort out what activities the individual can do Encourage patient to rely upon his/her spiritual faith Teach the patient to use guided imagery Probe and evaluate family's ability to provide emotional support Allow family to share their fears Assist the patient in identifying, sorting through, and verbalizing the various feelings generated by his/her medical condition Meet with family members  Ask patient list out  limitations  Use stress inoculation training  Use Acceptance and Commitment Therapy to help client accept uncomfortable realities in order to accomplish value-consistent goals Reinforce the client's insight into the role of his/her past emotional pain and present anxiety  Discuss examples demonstrating that unrealistic worry overestimates the probability of threats and underestimate patient's ability  Assist the patient in analyzing his or her worries Help patient understand that avoidance is reinforcing  Behavioral activation help the client explore the relationship, nature of the dispute,  Help the client develop new interpersonal skills and relationships Conduct Problem so living therapy Teach conflict resolution skills Use a process-experiential approach Conduct TLDP Conduct ACT  The patient and clinician reviewed the treatment plan on 01/26/2022. The patient approved of the treatment plan.    Conception Chancy, PsyD

## 2022-03-08 ENCOUNTER — Telehealth: Payer: Self-pay | Admitting: Internal Medicine

## 2022-03-08 NOTE — Telephone Encounter (Signed)
Raquel Sarna, please advise if forms have been received? Thanks

## 2022-03-08 NOTE — Telephone Encounter (Signed)
Patient is returning a call to the office.  He is not sure why he received the call.  Please call patient back at [phone]

## 2022-03-08 NOTE — Telephone Encounter (Signed)
See phone note dated 02/26/22.

## 2022-03-08 NOTE — Telephone Encounter (Signed)
Order placed to [facility].  Dustin Dean with [employer] is aware and voiced his understanding.  Lm to make patient aware.

## 2022-03-08 NOTE — Telephone Encounter (Addendum)
I spoke with the pt and notified him of the below statement per Dr Chase Caller regarding need for RHC. He is agreeable to have this repeated. Forwarding back to cards to arrange this. Thanks so much!

## 2022-03-08 NOTE — Telephone Encounter (Signed)
Yes I s d/w Dr Aundra Dubin and my apologies if somehow the communication from my end did not get through -> I will ask the pulm office to let him know  D/w Dr Loralie Champagne - the prior RHC was before TAVR procedure for aortic valve. The pressurs might have changed since then. While the prior RHC excludes him from the study for the [place], Dr Aundra Dubin and I still felt it is good to know the pressures now beofre committing to Tyvaso Rx as the perssures might have changed. Hope this makes sense

## 2022-03-08 NOTE — Telephone Encounter (Signed)
[place] for [place] to place re-certification order for o2

## 2022-03-09 ENCOUNTER — Telehealth (HOSPITAL_COMMUNITY): Payer: Self-pay

## 2022-03-09 NOTE — Telephone Encounter (Signed)
I do not have anything outstanding for him but I also did not get his folder back

## 2022-03-09 NOTE — Telephone Encounter (Signed)
Dr. Chase Caller, please advise if you have anything on pt in your CMN folder or paperwork that you might've been given by nurse.

## 2022-03-09 NOTE — Telephone Encounter (Signed)
Called pt. To arrange RHC. Stated he wants to have it done but not sure when he would like to do. Told patient that he figures out best timeframe to procedure to call the office at [phone] option 2

## 2022-03-09 NOTE — Telephone Encounter (Signed)
Ok.  Happy to see in office first if that helps.

## 2022-03-09 NOTE — Telephone Encounter (Signed)
Checked Dr. Golden Pop box and did not see anything in there on pt. Dustin Dean, please advise if you might've received a CMN on pt.

## 2022-03-12 ENCOUNTER — Telehealth: Payer: Self-pay | Admitting: Internal Medicine

## 2022-03-12 NOTE — Telephone Encounter (Signed)
CT with progression in fibrosis  Plan  - keep end of march appt in approx 4 weeks  - he should try to see and get his RHC by cardiology before that If possible - Rx decisions to be discussed at next visit    SIGNATURE    Dr. Brand Males, M.D., F.C.C.P,  Pulmonary and Critical Care Medicine Staff Physician, [place] Director - Interstitial Lung Disease  Program  Medical Director - [place] [facility] [place] at [place], [place], [address]   Pager: [phone], If no answer  -[place] or [place] [phone] Telephone (clinical office): [phone] Telephone (research): [phone]  9:21 PM 03/12/2022

## 2022-03-13 ENCOUNTER — Other Ambulatory Visit (HOSPITAL_COMMUNITY): Payer: Medicare Other

## 2022-03-13 NOTE — Telephone Encounter (Signed)
Called and spoke with pt letting him know the info per MR and he verbalized understanding. Nothing further needed. 

## 2022-03-14 ENCOUNTER — Encounter: Payer: Self-pay | Admitting: Internal Medicine

## 2022-03-15 NOTE — Telephone Encounter (Signed)
Received the following message from patient:   "Dr. Chase Caller,   It's scheduled for March 20, two days before I see you. It is the earliest I could schedule with my cardiologist, Dr. Clovis Riley. Dr. Radford Pax is the one who did it before along with the TAVR.   According to [employer]: "In a right heart catheter procedure, a catheter is passed into a vein in your neck or groin to measure the pressure in your heart and lungs. The procedure helps your doctor to work out how well your heart is working."   I'm curious as to why the one done before does not suffice. Has too much time elapsed? I have a fear that insurance won't pay for this next one, coming so soon after the last one.   Thank you, Dustin Dean"

## 2022-03-16 NOTE — Telephone Encounter (Signed)
  Spoe to Mr Ingham - he does no believe he had a prior RHC but I saw this in his records from 10/16/21 . I did indicate I will talk to Dr Delfin Gant his cardiologist if a 2nd [place] is required and if pressures could be different after TAVR surery   xxxxx   FINDINGS:   Coronary Angiography  1. Left Main -normal  2. Left anterior descending artery - normal  3. Diagonals - normal  4. Left Circumflex - normal  5. Obtuse Marginals - normal  6. Right Coronary Artery - normal  7. Posterior Descending Artery - normal   Hemodynamics  1. Right atrial pressure mean of 8 mmHg.  2. Right ventricular pressure 43/12 mmHg.  3. Pulmonary artery pressure 39/19 mmHg with a mean of 27 mmHg.  4. Pulmonary capillary wedge pressure 11 mmHg.  5. Left ventricular pressure 165/19 mmHg.  6. Aortic pressure 131/84 mmHg.  7. Pulmonary artery saturation 73.6%.  8. Aortic saturation 93.6%.  9. Calculated cardiac output of 6.8 L/minute by Fick with an index of 3.3  L/minute/m2.  10. Aortic valve area 1.05 cm2.  11. Mean aortic valve gradient of 40 mmHg.   CONCLUSIONS:  Successful transfemoral cardiac catheterization  Widely patent coronary arteries  Normal intracardiac filling pressures  Severe aortic stenosis by invasive hemodynamic criteria   RECOMMENDATIONS: The patient has severe aortic stenosis by invasive  hemodynamic criteria.  We will plan for day 2 studies followed by TAVR.

## 2022-03-21 NOTE — Telephone Encounter (Signed)
Pls let Dustin Dean know that  Itexted his cardiologist but not heard back . Been a few days. My cell is avaolable to his cardiologist. You can also give patient my cell and I can d/w his cards

## 2022-03-21 NOTE — Telephone Encounter (Signed)
Will close encounter

## 2022-03-23 ENCOUNTER — Ambulatory Visit (INDEPENDENT_AMBULATORY_CARE_PROVIDER_SITE_OTHER): Payer: Medicare Other | Admitting: Psychologist

## 2022-03-23 DIAGNOSIS — F330 Major depressive disorder, recurrent, mild: Secondary | ICD-10-CM

## 2022-03-23 DIAGNOSIS — F411 Generalized anxiety disorder: Secondary | ICD-10-CM

## 2022-03-23 NOTE — Progress Notes (Signed)
[facility] Counselor/Therapist Progress Note  Patient ID: Dustin Dean, MRN: [id number],    Date: 03/23/2022  Time Spent: 11:04 am to 11:43 am; total time: 39 minutes   This session was held via in person. The patient consented to in-person therapy and was in the clinician's office. Limits of confidentiality were discussed with the patient.    Treatment Type: Individual Therapy  Reported Symptoms: Concern about being belligerent and health concerns  Mental Status Exam: Appearance:  Well Groomed     Behavior: Appropriate  Motor: Normal  Speech/Language:  Clear and Coherent  Affect: Appropriate  Mood: normal  Thought process: normal  Thought content:   WNL  Sensory/Perceptual disturbances:   WNL  Orientation: oriented to person, place, and time/date  Attention: Good  Concentration: Good  Memory: WNL  Fund of knowledge:  Good  Insight:   Good  Judgment:  Good  Impulse Control: Good   Risk Assessment: Danger to Self:  No Self-injurious Behavior: No Danger to Others: No Duty to Warn:no Physical Aggression / Violence:No  Access to Firearms a concern: No  Gang Involvement:No   Subjective: Beginning the session, patient described himself as okay and indicated that due to his sister visiting he developed a new insight into being belligerent towards others. He processed thoughts and emotions related to this. From there, he reflected on his health and some concerns. He voiced that he wants to complete the MOST form with his general practitioner. He also processed some other thoughts related to his health. He stated understanding regarding the transition. He asked to follow up. He denied suicidal and homicidal ideation.    Interventions:  Worked on developing a therapeutic relationship with the patient using active listening and reflective statements. Provided emotional support using empathy and validation. Reviewed the treatment plan with the patient. Reviewed events  since the last session. Normalized and validated thoughts and emotions. Identified goals for the session. Identified the theme of being belligerent. Used socratic question to assist the patient. Challenged some of the thoughts expressed by the patient. Processed thoughts and emotions related to health concerns. Validated concerns. Explored who could complete the MOST form with the patient. Validated thoughts patient expressed regarding health. Disclosed about leaving the healthcare system. Discussed options moving forward. Assessed for suicidal and homicidal ideation.   Homework: NA  Next Session: Emotional support and discuss transition  Diagnosis: F33.0 major depressive affective disorder, recurrent, mild and F41.1 generalized anxiety disorder  Plan:  Goals Work through the grieving process and face reality of own death Accept emotional support from others around them Live life to the fullest, event though time may be limited Become as knowledgeable about the medical condition  Reduce fear, anxiety about the health condition  Accept the illness Accept the role of psychological and behavioral factors  Stabilize anxiety level wile increasing ability to function Learn and implement coping skills that result in a reduction of anxiety  Alleviate depressive symptoms Recognize, accept, and cope with depressive feelings Develop healthy thinking patterns Develop healthy interpersonal relationships  Objectives target date for all objectives is 12/21/2022 Identify feelings associated with the illness Family members share with each other feelings Identify the losses or limitations that have been experienced Verbalize acceptance of the reality of the medical condition Commit to learning and implement a proactive approach to managing personal stresses Verbalize an understanding of the medical condition Work with therapist to develop a plan for coping with stress Learn and implement skills for  managing stress Engage in  social, productive activities that are possible Engage in faith based activities implement positive imagery Identify coping skills and sources of emotional support Patient's partner and family members verbalize their fears regarding severity of health condition Identify sources of emotional distress  Learning and implement calming skills to reduce overall anxiety Learn and implement problem solving strategies Identify and engage in pleasant activities Learning and implement personal and interpersonal skills to reduce anxiety and improve interpersonal relationships Learn to accept limitations in life and commit to tolerating, rather than avoiding, unpleasant emotions while accomplishing meaningful goals Identify major life conflicts from the past and present that form the basis for present anxiety Learn and implement behavioral strategies Verbalize an understanding and resolution of current interpersonal problems Learn and implement problem solving and decision making skills Learn and implement conflict resolution skills to resolve interpersonal problems Verbalize an understanding of healthy and unhealthy emotions verbalize insight into how past relationships may be influence current experiences with depression Use mindfulness and acceptance strategies and increase value based behavior  Increase hopeful statements about the future.   Interventions Teach about stress and ways to handle stress Assist the patient in developing a coping action plan for stressors Conduct skills based training for coping strategies Train problem focused skills Sort out what activities the individual can do Encourage patient to rely upon his/her spiritual faith Teach the patient to use guided imagery Probe and evaluate family's ability to provide emotional support Allow family to share their fears Assist the patient in identifying, sorting through, and verbalizing the various feelings  generated by his/her medical condition Meet with family members  Ask patient list out limitations  Use stress inoculation training  Use Acceptance and Commitment Therapy to help client accept uncomfortable realities in order to accomplish value-consistent goals Reinforce the client's insight into the role of his/her past emotional pain and present anxiety  Discuss examples demonstrating that unrealistic worry overestimates the probability of threats and underestimate patient's ability  Assist the patient in analyzing his or her worries Help patient understand that avoidance is reinforcing  Behavioral activation help the client explore the relationship, nature of the dispute,  Help the client develop new interpersonal skills and relationships Conduct Problem so living therapy Teach conflict resolution skills Use a process-experiential approach Conduct TLDP Conduct ACT  The patient and clinician reviewed the treatment plan on 01/26/2022. The patient approved of the treatment plan.    Conception Chancy, PsyD

## 2022-03-26 NOTE — Telephone Encounter (Signed)
LEt patient know that I spoke to Dr Radford Pax today (he was on PAL) ->Dr Radford Pax says pressures might have changed after TAVR and so Dr Radford Pax will call and schedule him for [place]

## 2022-03-27 NOTE — Telephone Encounter (Signed)
Is sthis still needed? I never saw anyone give me a CMN form

## 2022-03-28 NOTE — Telephone Encounter (Signed)
vcnmg

## 2022-04-04 ENCOUNTER — Other Ambulatory Visit: Payer: Self-pay

## 2022-04-04 DIAGNOSIS — J849 Interstitial pulmonary disease, unspecified: Secondary | ICD-10-CM

## 2022-04-05 ENCOUNTER — Ambulatory Visit (INDEPENDENT_AMBULATORY_CARE_PROVIDER_SITE_OTHER): Payer: Medicare Other | Admitting: Internal Medicine

## 2022-04-05 DIAGNOSIS — J849 Interstitial pulmonary disease, unspecified: Secondary | ICD-10-CM

## 2022-04-05 LAB — PULMONARY FUNCTION TEST
DL/VA % pred: 111 %
DL/VA: 4.61 ml/min/mmHg/L
DLCO cor % pred: 71 %
DLCO cor: 16.38 ml/min/mmHg
DLCO unc % pred: 71 %
DLCO unc: 16.38 ml/min/mmHg
FEF 25-75 Post: 4.25 L/sec
FEF 25-75 Pre: 2.66 L/sec
FEF2575-%Change-Post: 59 %
FEF2575-%Pred-Post: 196 %
FEF2575-%Pred-Pre: 123 %
FEV1-%Change-Post: 15 %
FEV1-%Pred-Post: 72 %
FEV1-%Pred-Pre: 62 %
FEV1-Post: 2.03 L
FEV1-Pre: 1.76 L
FEV1FVC-%Change-Post: 4 %
FEV1FVC-%Pred-Pre: 120 %
FEV6-%Change-Post: 10 %
FEV6-%Pred-Post: 60 %
FEV6-%Pred-Pre: 55 %
FEV6-Post: 2.18 L
FEV6-Pre: 1.98 L
FEV6FVC-%Pred-Post: 106 %
FEV6FVC-%Pred-Pre: 106 %
FVC-%Change-Post: 10 %
FVC-%Pred-Post: 57 %
FVC-%Pred-Pre: 51 %
FVC-Post: 2.18 L
FVC-Pre: 1.98 L
Post FEV1/FVC ratio: 93 %
Post FEV6/FVC ratio: 100 %
Pre FEV1/FVC ratio: 89 %
Pre FEV6/FVC Ratio: 100 %
RV % pred: 78 %
RV: 1.74 L
TLC % pred: 57 %
TLC: 3.57 L

## 2022-04-05 NOTE — Patient Instructions (Signed)
Full PFT performed today. °

## 2022-04-05 NOTE — Progress Notes (Signed)
Full PFT performed today. °

## 2022-04-06 ENCOUNTER — Encounter: Payer: Self-pay | Admitting: Internal Medicine

## 2022-04-06 ENCOUNTER — Ambulatory Visit (INDEPENDENT_AMBULATORY_CARE_PROVIDER_SITE_OTHER): Payer: Medicare Other | Admitting: Internal Medicine

## 2022-04-06 VITALS — BP 127/72 | HR 77 | Wt 199.2 lb

## 2022-04-06 DIAGNOSIS — J849 Interstitial pulmonary disease, unspecified: Secondary | ICD-10-CM

## 2022-04-06 NOTE — Progress Notes (Unsigned)
Cardiology Dr. Loni Beckwith 02/15/20 Dustin Dean is 72 y.o. year old male with a history of No prior cardiovascular disease who presents to [place] cardiology for the evaluation of a heart murmur. This patient is a non-smoker and denies a previous history of myocardial infarction, congestive heart failure, or valvular heart disease. 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 He really has little in the way of exertional chest symptoms. Occasionally according to the wife if he walks up an incline briskly he may have some minor shortness of breath but remains active and has little in the way limitation. He denies a previous history of myocardial infarction, congestive heart failure, or known valvular heart disease. At a recent examination he was found to have a heart murmur and an echocardiogram was performed. It demonstrated a peak gradient across his aortic valve of 30 mmHg and an aortic valve area of 1.1 cm. This was read as being consistent with moderate aortic stenosis. I had a fairly significant discussion with the patient and his wife about the implications of his aortic valve and the need to follow the aortic stenosis progression over time.  11/14/2020 office visit with [name] pulmonary physician assistant at [facility] has past medical history of restrictive lung disease, interstitial lung disease, allergic rhinitis, laryngeal pharyngeal reflux, arthritis, lung nodules and persistent cough. He is a non-smoker and has never smoked. He has medication allergy to pseudoephedrine.  Mr. [name] presents today with complaint of a progressively worsening shortness of breath and dyspnea on exertion over the past several months. We have been following his interstitial lung disease since 2018. He has been stable without complaint of dyspnea on exertion over the past several years. He indicates that recently he has had more shortness  of breath with exertion. He and his wife will walk the dog in their usual route through the neighborhood that they have done for many years. He reports that he will get short of breath along the root in a way that he has never been bothered with previously. He has recently requested an albuterol inhaler and reports that it has been helpful. He has been using Symbicort 160/4.5 twice daily but has been having difficulty using it successfully as he has been coughing significantly and loses a lot of the medication. A recent chest CT conducted 10/24/2020 notes a progression in the interstitial changes. A 9 mm right lower lobe nodule is also noted. The plan is to have a 29-monthfollow-up chest CT conducted 02/04/2021. A 6-minute walk test conducted today shows oxygen desaturation and need for supplemental oxygen. I will place the order for supplemental oxygen. [facility]   Patient at rest on RA with baseline sats : 97% After exertion for 3 min on RA pt's sats were : 86% Patient placed on oxygen via nasal cannula at 2 lpm flow via portable oxygen concentrator device Exertion for 6 mins with oxygen confirmed final sats at 94%   #1 lung nodules Chest CT 10/24/2020 shows 9 mm right lower lobe lung nodule Follow-up chest CT scheduled for 375-montheevaluation in January 2023  2. Interstitial lung disease Chest CT 11/04/2020 notes fibrotic interstitial lung disease with evidence of progression compared to 08/28/2020 [facility]   #Primary  Care dec 2022 Osteopenia Due for DXA. Prostate cancer Saw Dr. Louis Meckel at [facility]. Biopsy + for cancer. They are just watching it. He is going to have MRI and another biopsy next visit.   OV 01/24/2021 -evaluation and transfer of care to Dr. Chase Caller in ILD center in [place].  Referred by  Marlane Mingle patient support group leader for the pulmonary fibrosis foundation support group  Subjective:  Patient ID: Dustin Dean, male , DOB: 08/26/1950 , age 34 y.o. , MRN: [id number] , ADDRESS: [place] [address] PCP Joneen Boers, MD Patient Care Team: Joneen Boers, MD as PCP - General (Family Medicine)  This Provider for this visit: Treatment Team:  Attending Provider: Brand Males, MD    01/24/2021 -   Chief Complaint  Patient presents with   Consult    Pt is here for a switch of providers to take over care for his pulmonary fibrosis. 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 Pt does become SOB with activities and also states that he does have a chronic cough.     HPI  Dustin Dean 72 y.o. -new evaluation for interstitial lung disease.  History is provided by review of the chart and also talking to the patient and his wife.  He tells me that he has had chronic cough since his teenage years.  But for the last few to several years its been more consistent.  He was seeing the physician assistant at [facility].  Somewhere along the way he got referred to Dr. Dennison Mascot right at [facility] some 3 years ago.  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  After that cough is largely improved except for occasional exacerbations in the fall in the winter.  Then in 2018 sometime after he went on gabapentin he was informed by the physician assistant that he might have early ILD.  He does not recollect any serology or biopsy being done.  He does recollect a few pulmonary function test.  Then all of a sudden starting September 2022 his cough "got out of hand" and also developed worsening shortness of breath.  This the first time he started developing worsening shortness of breath.  Then by November 2022 started needing exertional oxygen leading to current symptoms.  He was referred to [facility] but he preferred to establish with the  ILD center here in [place] with [place] based on [facility] recommendation.  He found Marlane Mingle through Internet search for pulmonary fibrosis foundation.  His current symptom score is below.  He is not on any antifibrotic.  [place] Integrated Comprehensive ILD Questionnaire       Past Medical History :  -He has obesity BMI 33-34 - Denies any collagen vascular disease - He is known to have aortic stenosis -sees cardiologist at [facility].  December 2021 echocardiogram EF 55 to 60% with tricuspid aortic valve and moderate stenosis normal right ventricle -Denies any asthma or COPD.  Denies any heart failure denies any collagen vascular disease -Denies snoring or excessive daytime somnolence.  Denies stroke.  Denies formal diagnosis of sleep apnea denies formal diagnosis of pulmonary hypertension [echo a year ago did not show pulmonary hypertension] -Has had vaccination against COVID but has not had COVID disease -Recent diagnosis of late 2022 early stage prostate cancer at [facility] urology on observation treatment -Has history of skin cancer for which he apply sunscreen regularly  ROS:  -Shortness of breath present Cough  chronic plus -Has intermittent chronic diarrhea and takes Imodium but no other GI side effects -Possible Raynaud's for the last several decades  FAMILY HISTORY of LUNG DISEASE:  Denies family for pulmonary fibrosis.  Denies family Struve COPD or asthma sarcoid or cystic fibrosis.  Denies family history of hypersensitive pneumonitis.  Denies autoimmune disease.  Denies any premature graying of the hair or albinism  PERSONAL EXPOSURE HISTORY:  No prior cigarette smoking -No marijuana use of cocaine use no intravenous drug use.  HOME  EXPOSURE and HOBBY DETAILS :  -Single-family home in a one-story space.  [place] setting age of the current home is 31 years.  He is lived there for 6 years.  He does use a feather pillow and this occasionally mold or mildew in the  bathroom.  All his life he is done active gardening with mulch and woodchips and occasionally damp soil.  He is worked out in the yard quite a bit and does gardening.  He enjoys the gardening.  OCCUPATIONAL HISTORY (122 questions) : -He worked in the [employer] at [employer] and then [employer] no [employer].  He does not remember if the buildings had mold in it.  He does not think so -Detail greater than 100 question organic and inorganic antigen history exposure is negative  PULMONARY TOXICITY HISTORY (27 items):  Denies  INVESTIGATIONS: Report of pulmonary function test but I am not able to get it in Care Everywhere       High-resolution CT scan of the chest August 2020  -There is a report of honeycombing but without any craniocaudal gradient.  Air-trapping reported alternative pattern more suggestive of chronic hypersensitive pneumonitis.  But there is already progression from August 2019 -Read by Dr. Vinnie Langton   CT Chest data- HRCT 11/04/20 unable for my visualization.   CLINICAL DATA:  Worsening shortness of breath   EXAM:  CT CHEST WITHOUT CONTRAST   TECHNIQUE:  Multidetector CT imaging of the chest was performed following the  standard protocol without intravenous contrast. High resolution  imaging of the lungs, as well as inspiratory and expiratory imaging,  was performed.   COMPARISON:  Chest CT dated August 29, 2018   FINDINGS:  Cardiovascular: Normal heart size. No pericardial effusion. Coronary  artery calcifications of the circumflex. Aortic valve  calcifications. Minimal calcified plaque of the thoracic aorta.   Mediastinum/Nodes: Mildly enlarged mediastinal lymph nodes,  unchanged compared to prior exam and likely reactive. Esophagus and  thyroid are unremarkable.   Lungs/Pleura: Central airways are patent. Bilateral air trapping.  Peribronchovascular and subpleural reticular opacities with traction  bronchiectasis and  no clear craniocaudal predominance. Honeycomb  change primarily seen in the anterior upper lobes is increased when  compared to prior exam. New linear nodular opacity of the right  lower lobe measuring up to 9 mm on series 4, image 210.   Upper Abdomen: No acute abnormality.   Musculoskeletal: No chest wall mass or suspicious bone lesions  identified.   IMPRESSION:  Fibrotic interstitial lung disease with evidence of progression when  compared with August 29, 2018 prior exam. Favor fibrotic  hypersensitivity pneumonitis given presence of air trapping.  Findings are suggestive of an alternative diagnosis (not UIP) per  consensus guidelines: Diagnosis of Idiopathic Pulmonary Fibrosis: An  Official ATS/ERS/JRS/ALAT Clinical Practice Guideline. [place], Iss 5, [phone], Sep 15 2016.   New linear nodular opacity of the right lower lobe measuring up  to 9  mm, likely an area of focal fibrosis. Recommend follow-up chest CT  in 3 months to ensure stability.   Aortic Atherosclerosis (ICD10-I70.0).   Addendum 01/26/2021 -radiology Dr. Weber Cooks wrote back saying CT scan is classic for chronic HP.  We will call the patient and get the patient started on nintedanib counseling which is first-line for progressive non-- IPF ILD.  If he is reluctant then we will do pirfenidone.  We will set up pharmacy counseling.  If he is reluctant for nintedanib based on risk versus benefit we will do pirfenidone  Electronically Signed    By: Yetta Glassman M.D.    On: 11/04/2020 14:17  No results found.   Phone visit 01/28/21  Emilyh  LEt Dustin Dean know ahead of the visit followup with me on 02/23/21   A) making referral to [facility] to discuss ofev which would be first line baed on what radiologist told me about CT and fact rheumaotid facot is positive  B) rheumatoid factor strongly positive - refer rheumatology Dr Deveshwar/Rice  C) I reviewed echo report (below) Will discuss  at followup  Aortic Valve: There is moderate stenosis, with peak and mean gradients  of 44.000 and 27.000 mmHg.  Aortic valve area calculates to approximately  1.4 cm    Mitral Valve: There is mild regurgitation.    Tricuspid Valve: There is mild regurgitation.    Tricuspid Valve: The right ventricular systolic pressure is normal (<36  mmHg).   1.  Adequate 2D M-mode and color flow Doppler echocardiogram demonstrates  normal left ventricular chamber size and contractility.  There may be mild  left ventricular hypertrophy.  Ejection fraction is estimated 65%  2.  The aortic valve is thickened and demonstrates reduced leaflet  mobility.  There is mild to moderate aortic stenosis with aortic valve  area calculating to 1.4 cm  3.  Mild mitral annular calcification is noted but there is good leaflet  mobility.  Mild mitral insufficiency is noted  4.  Tricuspid valve appears to be normal  5.  The atria are grossly normal size bilaterally  6.  The right ventricular chamber size and function is normal  6.  The pericardium is of normal thickness there is no effusion  OV 02/23/2021  Subjective:  Patient ID: Dustin Dean, male , DOB: [birth year] , age 85 y.o. , MRN: [id number] , ADDRESS: [place] [id number] PCP Joneen Boers, MD Patient Care Team: Joneen Boers, MD as PCP - General (Family Medicine)  This Provider for this visit: Treatment Team:  Attending Provider: Brand Males, MD    02/23/2021 -   Chief Complaint  Patient presents with   Follow-up    Pt recently had a HRCT and PFT and is here today to discuss the results.   Chronic cough on gabapentin  Interstitial lung disease work-up in progress -concern for hypersensitive pneumonitis  -Progressive phenotype between August 2020 and October 2022  9 mm right lower lobe nodule seen in October 2022 for the first time: Stable January 2023  HPI Dustin Dean 72 y.o. -since his last visit Dr. Weber Cooks the  radiologist did look at the CT scan and this confirmed his CT is not consistent with UIP but more likely an alternate pattern consistent with hypersensitive pneumonitis as seen by air trapping.  Patient has had serology work-up in which it is all negative except rheumatoid factor strongly positive.  He is yet to see the rheumatologist.  He does have significant  restrictions on the PFTs with reduced DLCO consistent with his obesity and ILD.  His symptoms are overall stable.  Did go through his exposure history again.  He says in 200 07/2006 and again in 2012 he had had 3 episodes of pneumonia.  All of this happened when he entered school buildings.  He was living in the same house at that time but the house itself was brand-new and there is no mold or mildew.  He is wondering about crawlspace because only [place] he has seen crawlspace but he said he has never been in the crawl space.  He does not know if there was mold in the crawl space.  I told him it is doubtful that mold or mildew from the crawlspace can get into the house.  He did have a feather pillow but since his last visit he is thrown that out.  He does do gardening and does get exposed to dampness.  At this point in time did discuss with him that he does have ILD and it is progressive.  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  Unclear if the positive rheumatoid factor is playing a role it could be if he has positive for rheumatoid arthritis.  Did indicate that ultimately only a biopsy can put to rest if he has IPF [being Caucasian gentleman greater than 65 is classic phenotype for IPF] but did indicate to him that given progression does not matter whether it is IPF or non-IPF antifibrotic's is indicated.  He is already met with the pharmacist and discussed the 2 antifibrotic's.  Did indicate to him that nintedanib is first-line and non-- IPF progressive phenotype.  Did discuss about the diarrhea.  Denies any  contraindication.  Therefore we will start this.  Did discuss whether diarrhea is willing to go through this.  Also discussed about ILD-Pro registry.  He is interested given the consent form.  Did indicate to him that it probably would be beneficial to do some kind of a limited work-up in differentiating his etiology.  The reasons would be to participate in clinical trials but also in case he needs immunosuppression such as prednisone or CellCept [indicated for hypersensitive pneumonitis but not for IPF] having a specific diagnosis would be beneficial.  I did think that his obesity puts him at some risk for getting surgical lung biopsy and therefore it might be better to start off with bronchoscopy with lavage and transbronchial biopsies.  We can address this in the future.  This visit was just focused on therapies.  Regarding his 9 mm lower lobe nodule: He had a CT scan of the chest and shows this is stable from October 2022 through January 2023.  The CT scan again reads as alternative pattern consistent with chronic HP.    CT Chest data January 2023  No results found.  Mediastinum/Nodes: Multiple prominent borderline but nonenlarged mediastinal and bilateral hilar lymph nodes, similar to the prior study esophagus is unremarkable in appearance. No axillary lymphadenopathy.   Lungs/Pleura: High-resolution images again demonstrate widespread but patchy areas of ground-glass attenuation, septal thickening, subpleural reticulation, thickening of the peribronchovascular interstitium, cylindrical and varicose traction bronchiectasis, peripheral bronchiolectasis and extensive honeycombing. There is no discernible craniocaudal gradient. The most extensive areas of honeycombing are anterior lung, predominantly in the upper lobes and right middle lobe. Some areas of the lung bases demonstrate relative sparing. Inspiratory and expiratory imaging demonstrates mild to moderate air trapping indicative of  small airways disease. No definite progression  compared to the recent prior study. No acute consolidative airspace disease. No pleural effusions. Previously described nodular area of architectural distortion in the right lower lobe (axial image 106 of series 8) is stable measuring 8 mm on today's examination, likely part of the underlying fibrosis. No other definite suspicious appearing pulmonary nodules or masses are noted.   Upper Abdomen: Unremarkable.   Musculoskeletal: There are no aggressive appearing lytic or blastic lesions noted in the visualized portions of the skeleton.   IMPRESSION: 1. 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 2. Previously noted nodular area of architectural distortion in the right lower lobe is unchanged, likely part of the fibrosis rather than a pulmonary nodule. 3. There are calcifications of the aortic valve and mitral annulus. Echocardiographic correlation for evaluation of potential valvular dysfunction may be warranted if clinically indicated. 4. Aortic atherosclerosis, in addition to 2 vessel coronary artery disease. Please note that although the presence of coronary artery calcium documents the presence of coronary artery disease, the severity of this disease and any potential stenosis cannot be assessed on this non-gated CT examination. Assessment for potential risk factor modification, dietary therapy or pharmacologic therapy may be warranted, if clinically indicated.   Aortic Atherosclerosis (ICD10-I70.0).     Electronically Signed   By: Vinnie Langton M.D.   On: 02/14/2021 06:17 stable January 2023  PFT       Latest Reference Range & Units 01/27/21  15:53 01/27/21 15:54  Anti Nuclear Antibody (ANA) NEGATIVE   NEGATIVE  Angiotensin-Converting Enzyme 9 - 67 U/L  49  Anti JO-1 0.0 - 0.9 AI  [id number]  Cyclic Citrullin Peptide Ab UNITS  <16  ds DNA Ab IU/mL  1  ENA RNP Ab 0.0 - 0.9 AI  0.4  RA Latex Turbid. <14 IU/mL  323 (H)  SSA (Ro) (ENA) Antibody, IgG <1.0 NEG AI  <1.0 NEG  SSB (La) (ENA) Antibody, IgG <1.0 NEG AI  <1.0 NEG  Scleroderma (Scl-70) (ENA) Antibody, IgG <1.0 NEG AI  <1.0 NEG  QUANTIFERON-TB GOLD PLUS   Rpt  A.Fumigatus #1 Abs Negative  Negative   Micropolyspora faeni, IgG Negative  Negative   Thermoactinomyces vulgaris, IgG Negative  Negative   A. Pullulans Abs Negative  Negative   Thermoact. Saccharii Negative  Negative   Pigeon Serum Abs Negative  Negative   (H): Data is abnormally high     OV 03/23/2021- acute video visit due to concerns for acute symptoms ? Ofev side effect  Subjective:  Patient ID: Trevis Nylen, male , DOB: 1951/01/01 , age 47 y.o. , MRN: [id number] , ADDRESS: [place] [id number] PCP Joneen Boers, MD Patient Care Team: Joneen Boers, MD as PCP - General (Family Medicine)  This Provider for this visit: Treatment Team:  Attending Provider: Brand Males, MD  Type of visit: Video Circumstance: COVID-19 national emergency Identification of patient Soliman Puleio with 1950-10-17 and MRN [id number] - 2 person identifier Risks: Risks, benefits, limitations of telephone visit explained. Patient understood and verbalized agreement to proceed Anyone else on call: just patient Patient location: Inpatient bed at [facility] which is another health system This provider location: [place]. pulmonary office [address]  [place], [place] [address].   03/23/2021 -in this video visit was set up on  acute basis because he started having symptoms after starting nintedanib.  He wanted to know if it was nintedanib symptoms.  The symptoms sound rather complex and little bit  out of proportion for nintedanib.  Therefore I scheduled this video visit and to my surprise he is sitting in a hospital bed at [facility].    HPI Dustin Dean 72 y.o. - started ofev mid-feb 2023. Started noticing feeling disoriented and at times weak. Then on 3/4-03/19/21 was feeling very weak whole bidy weakness and needed walker to even go across room. Tired. Stopped going to gym. Was using o2 all the time. Then on 03/21/21 feelign cold. ANd ahd fever  102F.  Does not remember Tuesday much. Initialyt Dx is pneumonia and testing/imaging -> by tthen pccm saw him Dr Dan Europe. Procal negative. WBC normal. Concerns is ILD flare up and recommendation is prednisone.  CT scan did not show pneumonia Curently on IV solumedrol. Currently some better. Yesterday ate well. Currently on 2.5LN . He has been covid negative. Multiplex resp virus panel negative per outside chart revie  With ofev was having some nausea. No abd pain. No diarrhea. No stomach cramps. No vomitting   Currently ofev on hold    03/28/2021 Follow up : ILD , Post hospital follow up  Patient returns for a 1 week follow-up visit.  Patient was seen in January 2023 for second opinion for interstitial lung disease.  Patient was felt to have possible concern for hypersensitivity pneumonitis.  He has had a progressive phenotype noted on CT scan from August 2020 to October 2022.  Serology was negative except for a strongly positive rheumatoid factor.  He was referred to rheumatology.  PFT showed significant restriction and reduced DLCO.  Has a history of recurrent pneumonia.  He was recommended to begin antifibrotic's. With OFEV.  Patient was hospitalized last week with severe dyspnea, weakness, activity intolerance to point he could not walk. Confusion, low oxygen levels, and fever-tmax 102. Kristeen Miss he has a viral illness and ILD flare.  Patient was hospitalized at an outlying hospital.  He was treated with steroids.  Discharged on a  steroid taper. CT chest 03/22/21 report showed fibrotic ILD, stable since 10/2020, no acute consolidation.  Covid , Influezna  and Viral panel were neg.  Since getting out of the hospital patient is feeling much better. Energy level has picked, decreased oxygen demands, no fever. Appetite is some better . Patient is concerned this could have been from [facility] as his symptoms started when he started OFEV.  Is on Oxygen with activity and At bedtime  2l/m . At rest O2 sats are >90%.      MDD 03/28/21 MDD Impression/Recs: Recent flare up. Bx can be prohibitive. get rheum eval and if neg - manage as HP. Maybe at the most BAL if stable  OV 04/05/2021  Subjective:  Patient ID: Dustin Dean, male , DOB: Oct 26, 1950 , age 4 y.o. , MRN: [id number] , ADDRESS: [place] [id number] PCP Joneen Boers, MD Patient Care Team: Joneen Boers, MD as PCP - General (Family Medicine)  This Provider for this visit: Treatment Team:  Attending Provider: Brand Males, MD    04/05/2021 -   Chief Complaint  Patient presents with   Follow-up    Pt states he is feeling better after last hospital stay and states each day is getting better.   Chronic cough on gabapentin  Interstitial lung disease work-up in progress -concern for hypersensitive pneumonitis  -  Progressive phenotype between August 2020 and October 2022   - started ofev mid feb 2023 -> early march 2023 admitted for ILD flare  9 mm right lower lobe nodule seen in October 2022 for the first time: Stable January 2023  HPI Khalen Betton 72 y.o. -returns for followup. He is on 1.5 tab of pred curently . Next week is 1 tab pred and off. He is better. Feeling better. AT rest not using o2 because he is better. In fact symp score shows huge imrpovement. Walking desat test is also largey imprpvd. All ? Steroid effects. We had conversation about his recent flare up  - has hx of recurrent pna: 2005-2006 x 2 episodes. . Lived in a different  house. Then again pneumonia 5 years ago in same house. Rx in urgent care. Had high fever. Current started abruptly 2 weeks into ofev andhe wondering if due to ofev. Did not have classic Ofev sid effects. Mentioned to him Ofev reduces riks of flare.  - gardening: no longer gardening  - featherpillow - got rid of it even after last visit   -visible mold - denies but he discussed his crawl space. Says "there is no mold bu fungs on the top wall". 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 Biopsy indicated if stable but first rheum consult    04/26/2021- Interim hx  Patient presents today for ILD follow-up. He has been on OFEV for three weeks, currently taking 150mg  twice daily. He has been off oral prednisone for two weeks. Breathing is not significantly worse off steroids. Cough is the same. Since Saturday he developed chills, fatigue and low grade fever at night. Temp 100.1-100.3. He reprots some heavy breathing. Oxygen 92% or higher. Urinary frequency. Having some incontinence. Continues on Gabapentin for chronic cough. Referred to rheumatology, has an apt beginning of June. He has been avoid exposures mold/mildew, gardening.     05/02/2021 Follow up : ILD , Fever  Patient presents for an acute office visit.  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  Serial CT chest from August 2020 to August 2022 showed progressive changes.  Serology was negative except for a positive rheumatoid factor.  He has been referred to rheumatology with consult pending.  Pulmonary function testing showed significant restriction and decreased DLCO.  Patient started Ofev on March 01, 2021.  Patient was admitted early March with severe weakness dyspnea and activity intolerance.  He also had intermittent confusion hypoxemia and a fever Tmax of 102.  He was felt to have an ILD flare with possible  viral illness however COVID, influenza and viral panel were all negative.  He was treated with steroids.  CT chest showed fibrotic ILD stable since October 2022.  His Ofev was held during admission.  After discharge he said that his energy level was improved and had decreased oxygen demands.  Based on oxygen after discharge was oxygen 2 L with activity and at bedtime.  Not requiring oxygen at rest with O2 saturations greater than 90%.  Patient was restarted on Ofev April 05, 2020.  After 2 weeks of been on Ofev patient developed low-grade fever.  This is waxed and waned and yesterday developed a fever Tmax of 103.  Patient went to the emergency room work-up was unrevealing.  CT chest showed stable ILD with no acute process.  CBC was normal blood cultures are no  growth to date.  Respiratory viral panel was negative.  Lactic acid was 1.5 urinalysis was normal, urine culture is pending.  LFTs were slightly elevated.  Since last night patient says he is feeling better. Fever is resolved. Took Tylenol . O2 saturations are 100%.  Has some sore throat that started late night .  Had good appetite today . No fever today . No increased Oxygen demands.  Wife is [name].  Patient denies any rash, skin lesions, skin redness, joint swelling, insect or tick bite, travel, discolored mucus, abdominal pain, nausea vomiting or diarrhea. Has no previous joint replacement or surgeries. Abdominal hernia repair >64yrs ago. No abd pain,  Followed by [facility]ology at [facility].  2D echo January 25, 2018 3 aortic valve is thickened with mild to moderate aortic valve stenosis.,  EF 55 to 60%.  Right ventricle is normal systolic function is normal.     OV 05/19/2021  Subjective:  Patient ID: Dustin Dean, male , DOB: Sep 02, 1950 , age 71 y.o. , MRN: [id number] , ADDRESS: [place] [id number] PCP Joneen Boers, MD Patient Care Team: Joneen Boers, MD as PCP - General (Family Medicine)  This Provider for this visit:  Treatment Team:  Attending Provider: Brand Males, MD    05/19/2021 -   Chief Complaint  Patient presents with   Follow-up    PFT performed today.  Pt states since stopping the OFEV, he has not had anymore fever and states overall he has felt good.    Chronic cough on gabapentin  - well controlled  Interstitial lung disease work-up in progress -concern for hypersensitive pneumonitis  -Progressive phenotype between August 2020 and October 2022   - started ofev mid feb 2023 -> early march 2023 admitted for ILD flare ->   9 mm right lower lobe nodule seen in October 2022 for the first time: Stable January 2023  HPI Dustin Dean 72 y.o. -returns with wife [name].  He tells me that after last visit he did a rechallenge with nintedanib and he picked up fever and ended up in the emergency room very similar to March 2023 hospitalization.  Symptoms started 1-2 weeks into taking nintedanib.  His wife feels strongly this is nintedanib related.  Have not seen the patient gets significant side effects from nintedanib but have seen one of the patient gets something similar with pirfenidone.  That particular patient had even after 1 dose.  I think the strength of evidence here is that this systemic inflammatory response is probably related to nintedanib.  Therefore he has stopped it.  I have advised him against taking nintedanib ever again.  Clinically he is feeling stable.  His walking desaturation test is stable.  His pulmonary function test is also stable.  He uses 2 L with exertion but today in office when we walked him on room air he dropped 7 points and this is similar to before.  He wants to try pirfenidone.  We have discussed this in the past.  We also discussed prednisone but he does not want to do prednisone because of hyperglycemia.  We discussed the risk, benefits and limitations of pirfenidone.  Pirfenidone is not well studied in chronic HP.  Is only studied in 1 subtype of non-IPF  progressive phenotype.  Nevertheless is the only other antifibrotic option available.  He wants to try a donor sample.  We will give him 2 months worth.  He is traveling to the beach  once he comes back he will  take it    07/03/2021 Follow up : ILD  Patient returns for a 6-week follow-up.  Patient has underlying interstitial lung disease with progressive phenotype.  Serial CT chest from August 20 to August 2022 showed progressive changes.  Serology was negative except for positive rheumatoid factor.  Rheumatology consult is pending.  Previous PFTs have showed significant restriction and decreased diffusing capacity.  Patient was started on Ofev March 01, 2021.  Patient had a SIRS like response to OFEV x 2 .  Ofev has been stopped indefinitely and advised not to restart.  Patient was placed on a drug holiday.  Started on Esbriet 1 month ago.  Patient says since starting Esbriet he seems to be tolerating very well.  LFTs prior to start had returned back to normal.  Previously were elevated.  Patient says he has had minimal nausea.  No diarrhea no upset stomach and appetite seems to be normal.  Patient says he has been using sunscreen when he is out side.  Has had no rash.  We discussed returning for ongoing labs. Patient has been referred to pulmonary rehab but has not contacted today. Patient says overall he is doing okay.  Wears his oxygen with activity as needed.  And at bedtime.  Has had no increased oxygen demands. Patient says he still gets short of breath with heavy activities.  But feels like he is stable at this point.  Has had no return of fever.      OV 09/08/2021  Subjective:  Patient ID: Dustin Dean, male , DOB: Jun 05, 1950 , age 56 y.o. , MRN: [id number] , ADDRESS: [address] [place] [address] PCP Joneen Boers, MD Patient Care Team: Joneen Boers, MD as PCP - General (Family Medicine)  This Provider for this visit: Treatment Team:  Attending Provider: Brand Males, MD    09/08/2021 -   Chief Complaint  Patient presents with   Follow-up    Pt states he has been doing okay since last visit and denies any complaints.      HPI Dustin Dean 72 y.o. -returns for follow-up.  He tells me that he is lost more weight.  He is stable.  His symptom score shows good stability.  He is tolerating pirfenidone really well.  He has lost weight.  Current BMI is 31 but it is all intentional with the help of exercise, pirfenidone and also eating less calories.  No side effects from pirfenidone no sunburn nothing.  Is taking 3 to 2 pills 3 times daily.  He is willing to roll over to 1 big pill 3 times daily.  His main questions were -He wants to do some gardening such as cut wine but he assures me that will not be any exposure to organic dust.  -He wants to donate his body after death to medical school.  I advised him that either [facility] or [facility] is fine.  -He also has aortic stenosis.  He saw cardiology today at [facility].  He saw Dr. Loni Beckwith.  He is being referred to surgery at [place] for TAVR consideration.  According to the discussion [not clearly apparent in the chart review] TAVR is being recommended early because of his pulmonary fibrosis.  I did explain to him that in advance pulmonary fibrosis surgical risk is high but did want him to ask the surgeon if there is any downside to operating or placing TAVR procedure when the aortic stenosis is not critical.  PFT   OV 11/17/2021  Subjective:  Patient ID: Dustin Dean, male , DOB: 01-Feb-1950 , age 74 y.o. , MRN: [id number] , ADDRESS: [place] [address] PCP Joneen Boers, MD Patient Care Team: Joneen Boers, MD as PCP - General (Family Medicine)  This Provider for this visit: Treatment Team:  Attending Provider: Brand Males, MD    11/17/2021 -   Chief Complaint  Patient presents with   Follow-up    PFT performed today.  Pt  states he has multiple concerns to discuss.     HPI Roux Giesbrecht 72 y.o. -returns for follow-up.  He is doing well overall.  However for the last 3 days he has had a runny nose and with that he is having decreased energy.  He is got decreased social interest.  He also feels his memory is failing him more in the last 3 days he has noted some memory changes for the last several months but it is a lot worse in the last 3 days since the runny nose.  His COVID is negative but there is no fever brown sputum or yellow sputum.  There is no significant drainage.  There is no worsening shortness of breath there is no worsening cough there is no chills.  In addition he has new complaint of tender itchy scalp for the last 3 weeks there is no rash but he has an appointment with dermatologist because his scalp is dry and flaky.  He is not doing gardening but he does go out occasionally in the sun and he does not wear sunscreen or hat.  He is on pirfenidone which can cause skin rash and especially in the setting of sun exposure.  I did caution him this could be because of pirfenidone but he is going to see a dermatologist anyways.  Outside of this he is tolerating his pirfenidone well.  I did indicate to him he can roll himself into 1 big pill 3 times daily instead of 3 small pills 3 times daily for a total of 9 pills.  He also told me that he is at risk for depression.  He says in the past he has had significant depression he is on antidepressant.  He is worried this can come back with his ILD.  We talked about seeing Elias Else clinical psychologist who specializes in medical issues he is willing to see him.  Also discussed about doing light therapy  Preoperative evaluation: He has aortic stenosis.  He is saying now that it could be critical.  Apparently TAVR procedure is being planned date unknown.  Currently because of the cold he has suspended pulmonary rehabilitation he wants to know if he can go back.  I did  tell him we need to get clearance from the cardiologist because of critical aortic stenosis history I thought it was moderate in the past.          OV 02/26/2022  Subjective:  Patient ID: Dustin Dean, male , DOB: [birth year] , age 58 y.o. , MRN: [id number] , ADDRESS: [place] [address] PCP Joneen Boers, MD Patient Care Team: Joneen Boers, MD as PCP - General (Family Medicine)  This Provider for this visit: Treatment Team:  Attending Provider: Brand Males, MD    HPI Dustin Dean 72 y.o. -presents for follow-up.  Presents with his wife.  His wife is an independent historian today.  Since I last saw him in November 2023 several issues have happened.  1 in mid November 2023 had sinus congestion and he was given doxycycline and prednisone.  Then in mid December 2023 he had TAVR procedure for his aortic stenosis.  He says this went really well and his murmur has now resolved.  Then straight after Christmas 2023 he had COVID-19.  WE called in [place].  He is tolerating his pirfenidone at 801 mg pill 3 times daily without any problems.  He has lost some weight intentionally.  He does admit that after all this he is slightly more short of breath than before..  In fact when he walked him he started desaturating.  This is a new finding.  He had pulmonary function test today that shows significant decline in his DLCO.  This was done today and is captured below.  His most recent hemoglobin on 02/20/2022 was reviewed done at [facility] [place].  Was 13.6 g% and normal.  We discussed the possible need of right heart catheterization.  When he was in the office he did not recall that he actually had a right heart catheterization.  After he left the office when I reviewed the records I noticed that in October 2023 he did have heart catheterization.  At that time he did have a right heart catheterization before the TAVR.  In this the pulmonary capillary wedge pressure was 11 mmHg,  pulmonary artery mean pressure was 27 mmHg and elevated, Fick cardiac index was 3.3 mL.  It was in this catheterization that they saw that he had severe aortic valve stenosis with aortic valve area of 1.05 cm and a valve gradient of 40 mmHg.  He did have follow-up echocardiogram on 02/08/2022.  His left ventricular ejection fraction is 55-60%.  Right ventricle is normal.  There is TAVR bioprosthetic valve.  No pericardial effusion.     Last Weight  Most recent update: 02/26/2022 10:35 AM    Weight  93.4 kg (205 lb 12.8 oz)              Modified Six Minute Walk - 02/26/22 1000     Type of O2 used  O2 3/L   POC pulsed oxygen   Number of laps completed  3    Lap Pace Moderate    Resting Heartrate 83 bpm    Final Heartrate 113 bpm    Resting Pulse Ox 94 %   room air. Patient feels he did not need his POC on at this time.   Desaturated to <= 3 points Yes    Desaturated to < 88% Yes    Distance walked when desaturation occurred  240 feet   patient completed one lap.   Became tachycardic Yes    Symptoms  SOB after one lap on room air.  Applied 3 lpm POC.  SaO2 back to 95%.  patient was able to complete remaining 2 laps - 3 laps total.    Was the O2 correction test done? Yes    Amount of O2 needed to correct destauration 3 L    Distance walked without destat below 88%  480 feet   patient walked remaining 2 laps on 3 lpm POC pulsed oxygen   comments patient desat after 1 lap to 88% room air.  POC oxygen applied  at 3 lpm pulsed.  patient completed remaining 2 laps SaO2 95%.            OV 04/08/2022  Subjective:  Patient ID: Dustin Dean, male , DOB: [birth year] , age 36 y.o. , MRN: [id number] ,  ADDRESS: [address] [place] [address] PCP Joneen Boers, MD Patient Care Team: Joneen Boers, MD as PCP - General (Family Medicine)  This Provider for this visit: Treatment Team:  Attending Provider: Brand Males, MD   02/26/2022 -   Chief Complaint  Patient presents  with   Follow-up    COVID end of December 2023.  Aortic valve replacement (TAVR)  01/02/22.  Cough persistent.  Runny nose improved after seeing ENT.   Chronic cough on gabapentin  - well controlled  Interstitial lung disease high prob for hypersensitive pneumonitis  -Progressive phenotype between August 2020 and October 2022   - started ofev mid feb 2023 -> early march 2023 admitted for ILD flare -> rechallenged ofev April 2023 -> ER vist SIRS   - SAE with OFev/ Ofev stopped  - started Pirfenioned May 2023 (rjected prednisone due to side effec tprofile)  - transplaint discussion marrch 2-24 - not interested  9 mm right lower lobe nodule seen in October 2022 for the first time: Stable January 2023 repoted as fibrosis ->Not reported n feb 2024   8 mm lateral subpleural left lower lobe nodule (5/93),-> unchanged from 08/30/2017.-> through feb 2024   [place] [facility] 10/16/21 preTVAV  - PA mean 27 - PCWP 11 - CI 3.3 - severe aortic stenosis  Status post TAVR procedure for aortic valve stenosis mid December 2023.  Respiratory exacerbations/infections  -Mid November 2023: Sinus congestion and given doxycycline and prednisone  -01/09/2022: COVID-19 and given molnupiravir E  [facility] allergy panel December 2023 -Mildly positive for cat and dog dander.  Otherwise negative.  Normal IgE'. Corene Cornea Eos -   Latest Reference Range & Units 02/17/16 19:20 04/26/21 15:31 05/01/21 19:55 12/20/21 12:34 01/09/22 23:37  Eosinophils Absolute 0.0 - 0.5 K/uL 0.4 0.3 0.2 0.6 0.4    Anxiety and depression  -Seeing [place] psychology  04/08/2022 -   Chief Complaint  Patient presents with   Follow-up    Pt is here for follow up. Pt states no adverse effects noted so far for the Esbriet.      HPI Dustin Dean 72 y.o. - returns for followup. Still on esbriet. Symotoms stable. Was suppsoed to have had [facility] but on day of procedure DrSam Turner had multiple other patient emergencies. So it is  rescheduled. Date pending.  Here to review PFT and CT Due to concern for progression. CT is definitel progressed from a year ago - despite esbriet.  PFT worse since a year ago but since covidd ? Rebounding back. We discuseed disease progression . Eplained next step is to try immune modulator. Altneratively await [facility] and see if he has WHO-3 Pulm htn and if so commit to tyvaso (interestingly this is a hypothesisas primary Rx in trials tet[place] 305). We took shared decision to awaith [place]. Reommended he hold off clinical trial at this point. Cough still + but says is better with gabapentin. He does have high eos and we will discuss this next visit.  Obesity: losing weight  Wife also present and was independent historian     SYMPTOM SCALE - ILD 01/24/2021 02/23/2021   04/05/2021 Post hopsilaization. On pred taper and off ofev Ofev 150 twice daily; No prednsone  05/19/2021 214# 11/17/2021  02/26/2022 205# 04/08/2022 199#  Current weight         esbiret esbriet esbiret  O2 use 2L [place] with ex 2L with ex 2L Wth ex at hoe 2L      Shortness of  Breath 0 -> 5 scale with 5 being worst (score 6 If unable to do)           At rest 0 0 0 1 0 0 0 0  Simple tasks - showers, clothes change, eating, shaving 1 1 0.5 2 1 1  0 0  Household (dishes, doing bed, laundry) 1 1 1 2 2 1 1 1   Shopping 2 1 0 NA 1 1 1 1   Walking level at own pace 5 2 0 1 (with oxygen) 1 2 2 1   Walking up Stairs 5 3 1  with o2, 6 without o2 2 (with oxygen) 2 3 3 2   Total (30-36) Dyspnea Score 14 8   8 7 8 7 5  with o2  How bad is your cough? 2 3 0 with gbapentin 1-2 (on gabapentin) 2 3 2 3   How bad is your fatigue 1 2 0 4 1 4  0 0  How bad is nausea 00000 0 0 0 0 0 0 0  How bad is vomiting?   0 0 0 0 0 0 0 0  How bad is diarrhea? 0 0 0 0 0 0 0 0  How bad is anxiety? 0 0 0 0 0 2 0 1  How bad is depression 0 0 0 0 (on antidepressant) 1 2 0 1  Any chronic pain - if so where and how bad 0 0 0 0 0  1 x     Simple office walk 185 feet x  3 laps goal  with forehead probe 01/24/2021  04/05/2021 On prendisone. Off ofev 05/19/2021 Off ofev 09/08/2021 On esbiret since may 2023 02/26/2022 Esbiret since may 2023 04/06/22  O2 used Ra at rest, desat at 1 lap and then walked with 2L piulse Ra - being on room air for 15 min and in mddile of pred burst taper   ra ra  Number laps completed 1 lap on RA, then 3 laps on 2L Did all 3 laps   1 of 3 laps - forehead probe 3 of 3 laps  Comments about pace x avg  avg av   Resting Pulse Ox/HR 96% and 88/min at 1 lap 98% an Hr 92 98% AND HR 81 98% and HR 77 94% 94% and HR 82  Final Pulse Ox/HR 92% and 115/min on 3 laps on 2L pulsed 92% and HR 117 91% and HR 115 92% and HR 113 88% 88% and HR 109  Desaturated </= 88% no no no no    Desaturated <= 3% points yes Yes, 6  Yes 7  Yes 6 pots    Got Tachycardic >/= 90/min yes yes yes avg    Symptoms at end of test Modreate dyspnea Modate dyspnea Mild dyspnea Mild dyspnea    Miscellaneous comments Corrected with 2L [place] Did not need o2, improved         PFT     Latest Ref Rng & Units 04/04/2022    8:31 AM 02/26/2022    9:56 AM 11/17/2021    1:02 PM 05/19/2021   10:56 AM 02/21/2021    4:04 PM  PFT Results  FVC-Pre L 1.98  P 2.02  1.94  1.95  1.82   FVC-Predicted Pre % 51  P 52  46  46  43   FVC-Post L 2.18  P    1.95   FVC-Predicted Post % 57  P    46   Pre FEV1/FVC % % 89  P 88  88  90  88   Post FEV1/FCV % % 93  P    91   FEV1-Pre L 1.76  P 1.78  1.71  1.75  1.60   FEV1-Predicted Pre % 62  P 63  56  56  51   FEV1-Post L 2.03  P    1.76   DLCO uncorrected ml/min/mmHg 16.38  P 12.33  16.63  19.56  15.57   DLCO UNC% % 71  P 53  67  79  63   DLCO corrected ml/min/mmHg 16.38  P 12.62  16.87  19.56  15.57   DLCO COR %Predicted % 71  P 54  68  79  63   DLVA Predicted % 111  P 98  106  116  106   TLC L 3.57  P    3.76   TLC % Predicted % 57  P    56   RV % Predicted % 78  P    75     P Preliminary result    Narrative & Impression  CLINICAL DATA:  Interstitial lung  disease.   EXAM: CT CHEST WITHOUT CONTRAST   TECHNIQUE: Multidetector CT imaging of the chest was performed following the standard protocol without intravenous contrast. High resolution imaging of the lungs, as well as inspiratory and expiratory imaging, was performed.   RADIATION DOSE REDUCTION: This exam was performed according to the departmental dose-optimization program which includes automated exposure control, adjustment of the mA and/or kV according to patient size and/or use of iterative reconstruction technique.   COMPARISON:  05/01/2021 and 08/30/2017.   FINDINGS: Cardiovascular: Aortic valve repair. Coronary artery calcification. Heart is enlarged. No pericardial effusion. Enlarged right and left pulmonary arteries.   Mediastinum/Nodes: Mediastinal lymph nodes measure up to 1.4 cm in the AP window, stable and often seen in the setting of interstitial lung disease. Hilar regions are difficult to evaluate without IV contrast. No axillary adenopathy. Air in the esophagus can be seen with dysmotility.   Lungs/Pleura: Severe honeycombing with interstitial thickening, traction bronchiectasis/bronchiolectasis and subpleural reticulation. Minimal ground-glass. Findings are progressive from 05/01/2021. 8 mm lateral subpleural left lower lobe nodule (5/93), unchanged from 08/30/2017. Per Fleischner Society guidelines, no follow-up is necessary. No new pulmonary nodules. No pleural fluid. Airway is unremarkable. Mild air trapping.   Upper Abdomen: Visualized portions of the liver, gallbladder, right adrenal gland, spleen and stomach are grossly unremarkable. No upper abdominal adenopathy.   Musculoskeletal: Degenerative changes in the spine. Flowing anterior osteophytosis in the thoracic spine.   IMPRESSION: 1. Progressive pulmonary parenchymal pattern of fibrosis with air trapping, indicative of fibrotic hypersensitivity pneumonitis. Findings are suggestive of an  alternative diagnosis (not UIP) per consensus guidelines: Diagnosis of Idiopathic Pulmonary Fibrosis: An Official ATS/ERS/JRS/ALAT Clinical Practice Guideline. [place], Iss 5, [id number], Sep 15 2016. 2. Coronary artery calcification. 3. Enlarged right and left pulmonary arteries, indicative of pulmonary arterial hypertension.     Electronically Signed   By: Lorin Picket M.D.   On: 03/01/2022 09:34     has a past medical history of Aortic stenosis, Flu, Pneumonia, Prostate cancer ([facility]), Pulmonary fibrosis ([place]), and Skin cancer.   reports that he has never smoked. He has been exposed to tobacco smoke. He has never used smokeless tobacco.  Past Surgical History:  Procedure Laterality Date   HERNIA REPAIR     NASAL SEPTUM SURGERY     SKIN CANCER EXCISION      Allergies  Allergen Reactions   Bee Venom Rash   Shellfish Allergy Hives   Nintedanib Other (See Comments)    SIRS, fevers   Pseudoephedrine Anxiety    Immunization History  Administered Date(s) Administered   Fluad Quad(high Dose 65+) 10/23/2017, 09/30/2019, 10/14/2020, 10/18/2020   Influenza-Unspecified 11/11/2009, 11/08/2011, 11/03/2013, 11/22/2014, 11/10/2015   Moderna Covid-19 Vaccine Bivalent Booster 59yrs & up 10/24/2020   Moderna Sars-Covid-2 Vaccination 03/19/2019, 04/16/2019, 11/16/2019, 05/07/2020   Pneumococcal Conjugate-13 12/08/2007, 12/21/2015, 09/04/2018   Pneumococcal Polysaccharide-23 12/08/2007, 06/21/2017, 06/16/2018   Tdap 12/08/2007, 11/27/2018   Zoster Recombinat (Shingrix) 10/30/2013, 09/04/2018, 11/27/2018   Zoster, Live 10/30/2013   Zoster, Unspecified 10/30/2013    Family History  Problem Relation Age of Onset   Cancer Mother    Congestive Heart Failure Father    Prostate cancer Father    Healthy Sister      Current Outpatient Medications:    acetaminophen (TYLENOL) 325 MG tablet, Take by mouth as needed., Disp: , Rfl:    aspirin-acetaminophen-caffeine  (EXCEDRIN MIGRAINE) 250-250-65 MG tablet, Take by mouth as needed., Disp: , Rfl:    Calcium Carb-Cholecalciferol (CALCIUM/VITAMIN D PO), Take 1 tablet by mouth daily., Disp: , Rfl:    Cetirizine HCl (ZYRTEC PO), Take by mouth., Disp: , Rfl:    fluorouracil (EFUDEX) 5 % cream, Apply topically., Disp: , Rfl:    fluticasone (FLONASE) 50 MCG/ACT nasal spray, Place into both nostrils daily., Disp: , Rfl:    gabapentin (NEURONTIN) 300 MG capsule, Take 300 mg by mouth 3 (three) times daily., Disp: , Rfl:    ipratropium (ATROVENT) 0.06 % nasal spray, Place 2 sprays into both nostrils 3 (three) times daily., Disp: , Rfl:    loperamide (IMODIUM) 2 MG capsule, Take 1 mg by mouth once., Disp: , Rfl:    meclizine (ANTIVERT) 25 MG tablet, Take 25 mg by mouth 2 (two) times daily as needed., Disp: , Rfl:    Multiple Vitamin (MULTIVITAMIN) capsule, Take 1 capsule by mouth every morning., Disp: , Rfl:    naproxen sodium (ALEVE) 220 MG tablet, Take 220 mg by mouth daily as needed., Disp: , Rfl:    omeprazole (PRILOSEC) 40 MG capsule, Take 40 mg by mouth daily., Disp: , Rfl:    PAROXETINE HCL PO, Take by mouth., Disp: , Rfl:    Pirfenidone (ESBRIET) 801 MG TABS, Take 801 mg by mouth 3 (three) times daily with meals., Disp: 270 tablet, Rfl: 1   rosuvastatin (CRESTOR) 5 MG tablet, Take 1 tablet by mouth daily., Disp: , Rfl:    SUMAtriptan (IMITREX) 100 MG tablet, TAKE ONE TABLET BY MOUTH AT ONSET OF HEADACHE; MAY REPEAT ONE TABLET IN 2 HOURS IF NEEDED. MAX OF 200 MG PER DAY, Disp: , Rfl:       Objective:   Vitals:   04/06/22 1058  BP: 127/72  Pulse: 77  SpO2: 93%  Weight: 199 lb 3.2 oz (90.4 kg)    Estimated body mass index is 30.29 kg/m as calculated from the following:   Height as of 02/26/22: 5\' 8"  (1.727 m).   Weight as of this encounter: 199 lb 3.2 oz (90.4 kg).  @WEIGHTCHANGE @  [employer]   04/06/22 1058  Weight: 199 lb 3.2 oz (90.4 kg)     Physical Exam    General: No distress. Looks  well Neuro: Alert and Oriented x 3. GCS 15. Speech normal Psych: Pleasant Resp:  Barrel Chest - no.  Wheeze - no, Crackles - YES BASE, No overt respiratory distress CVS:  Normal heart sounds. Murmurs - no Ext: Stigmata of Connective Tissue Disease - no HEENT: Normal upper airway. PEERL +. No post nasal drip        Assessment:       ICD-10-CM   1. 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 Recovery post covid.  - CT Spring 2024 worse fibrosis x 1 year despite pirfenidone -Glad you are tolerating pirfenidone really well   Plan (shared decision making) --Continue pirfenidone per protocol  -Pharmacy to consider rolling into 1 big pill 3 times daily  - take with food  - space 5-6 hours apart  - apply sunscreen  - wear hat  - hydrate well - await RHC with Dr Radford Pax -Holding off on rheumatology evaluation for now -Okay to do judicious gardening as long as there is no exposure to organic dust andwear N95 mask  = keep pulse ox > 88% -6- 8 weeks do sprioemtry and dlco - at folloowup discuss Tyvaso v cellcept v researc protocol  Chronic cough  Noticed that gabapentin is really helping you  Plan - Okay to continue gabapentin - can discuss anti-eos Rx next visit; consider Feno check next visit   Aortic Stenosis - moderate  - 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 Chase Caller in 6  weeks but after pulmonary function test  -30-minute visit, simple walking desaturation test and symptom score at follow-up    SIGNATURE    Dr. Brand Males, M.D., F.C.C.P,  Pulmonary and Critical Care Medicine Staff Physician, [place] Director - Interstitial Lung Disease  Program  [facility] [place] [facility] [place], [place], [address]  Pager: [phone], If no answer or between  15:00h - 7:00h: call [phone] Telephone: [phone]  10:51 PM 04/08/2022

## 2022-04-06 NOTE — Patient Instructions (Addendum)
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 Recovery post covid.  - CT Spring 2024 worse fibrosis x 1 year despite pirfenidone -Glad you are tolerating pirfenidone really well   Plan (shared decision making) --Continue pirfenidone per protocol  -Pharmacy to consider rolling into 1 big pill 3 times daily  - take with food  - space 5-6 hours apart  - apply sunscreen  - wear hat  - hydrate well - await RHC with Dr Radford Pax -Holding off on rheumatology evaluation for now -Okay to do judicious gardening as long as there is no exposure to organic dust andwear N95 mask  = keep pulse ox > 88% -6- 8 weeks do sprioemtry and dlco - at folloowup discuss Tyvaso v cellcept v researc protocol  Chronic cough  Noticed that gabapentin is really helping you  Plan - Okay to continue gabapentin - can discuss anti-eos Rx next visit; consider Feno check next visit   Aortic Stenosis - moderate  - 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 Chase Caller in 6  weeks but after pulmonary function test  -30-minute visit, simple walking desaturation test and symptom score at follow-up

## 2022-04-08 ENCOUNTER — Telehealth: Payer: Self-pay | Admitting: Internal Medicine

## 2022-04-10 ENCOUNTER — Other Ambulatory Visit: Payer: Self-pay | Admitting: Pharmacist

## 2022-04-10 ENCOUNTER — Ambulatory Visit (INDEPENDENT_AMBULATORY_CARE_PROVIDER_SITE_OTHER): Payer: Medicare Other | Admitting: Psychologist

## 2022-04-10 DIAGNOSIS — F330 Major depressive disorder, recurrent, mild: Secondary | ICD-10-CM | POA: Diagnosis not present

## 2022-04-10 DIAGNOSIS — J849 Interstitial pulmonary disease, unspecified: Secondary | ICD-10-CM

## 2022-04-10 DIAGNOSIS — F411 Generalized anxiety disorder: Secondary | ICD-10-CM

## 2022-04-10 MED ORDER — ESBRIET 801 MG PO TABS: 801.0000 mg | ORAL_TABLET | Freq: Three times a day (TID) | ORAL | 1 refills | Status: DC

## 2022-04-10 NOTE — Telephone Encounter (Signed)
Refill sent for ESBRIET to [facility]) for Esbriet: [phone]  Dose: 801 mg three times daily  Last OV: 04/06/22 Provider: Dr. Chase Caller  Next OV: 05/22/2022 LFTs 01/09/22 stable   Jennye Boroughs Wilhemina Bonito, PharmD, MPH, BCPS Clinical Pharmacist (Rheumatology and Pulmonology)

## 2022-04-10 NOTE — Progress Notes (Signed)
[place] Counselor/Therapist Progress Note  Patient ID: Daily Hueston, MRN: [id number],    Date: 04/10/2022  Time Spent: 11:02 am to 11:38 am; total time: 36 minutes   This session was held via in person. The patient consented to in-person therapy and was in the clinician's office. Limits of confidentiality were discussed with the patient.    Treatment Type: Individual Therapy  Reported Symptoms: Denied concerns  Mental Status Exam: Appearance:  Well Groomed     Behavior: Appropriate  Motor: Normal  Speech/Language:  Clear and Coherent  Affect: Appropriate  Mood: normal  Thought process: normal  Thought content:   WNL  Sensory/Perceptual disturbances:   WNL  Orientation: oriented to person, place, and time/date  Attention: Good  Concentration: Good  Memory: WNL  Fund of knowledge:  Good  Insight:   Good  Judgment:  Good  Impulse Control: Good   Risk Assessment: Danger to Self:  No Self-injurious Behavior: No Danger to Others: No Duty to Warn:no Physical Aggression / Violence:No  Access to Firearms a concern: No  Gang Involvement:No   Subjective: Beginning the session, patient described himself as doing well while reflecting on events since the last session. Continuing to talk, he briefly reflected on frustration with a lack of communication between administrative teams and medical teams regarding scheduling and use of language. From there, he reflected on what he wants to address with his future therapist. Continuing to talk, he reflected on his experience and what he learned about himself. He processed how he grew and how the therapeutic relationship assisted him. He denied suicidal and homicidal ideation.    Interventions:  Worked on developing a therapeutic relationship with the patient using active listening and reflective statements. Provided emotional support using empathy and validation. Reviewed the treatment plan with the patient. Praised the  patient for doing well and explored what has assisted the patient. Processed thoughts and emotions. Explored some of the frustrations that patient was experiencing regarding a medical office. Used socratic questions and challenged some of the thoughts expressed. Reflected on what he learned about himself in therapy. Processed and assisted in identifying what he wants to focus on in therapy moving forward. Used an analogy to assist the patient. Assisted in problems solving. Reflected on how the therapeutic relationship assisted the patient. Provided empathic statements. Assessed for suicidal and homicidal ideation.   Homework: NA  Next Session: NA. Patient will transition to a new therapist as this clinician is leaving.   Diagnosis: F33.0 major depressive affective disorder, recurrent, mild and F41.1 generalized anxiety disorder  Plan:  Goals Work through the grieving process and face reality of own death Accept emotional support from others around them Live life to the fullest, event though time may be limited Become as knowledgeable about the medical condition  Reduce fear, anxiety about the health condition  Accept the illness Accept the role of psychological and behavioral factors  Stabilize anxiety level wile increasing ability to function Learn and implement coping skills that result in a reduction of anxiety  Alleviate depressive symptoms Recognize, accept, and cope with depressive feelings Develop healthy thinking patterns Develop healthy interpersonal relationships  Objectives target date for all objectives is 12/21/2022 Identify feelings associated with the illness Family members share with each other feelings Identify the losses or limitations that have been experienced Verbalize acceptance of the reality of the medical condition Commit to learning and implement a proactive approach to managing personal stresses Verbalize an understanding of the medical condition Work with  therapist to develop a plan for coping with stress Learn and implement skills for managing stress Engage in social, productive activities that are possible Engage in faith based activities implement positive imagery Identify coping skills and sources of emotional support Patient's partner and family members verbalize their fears regarding severity of health condition Identify sources of emotional distress  Learning and implement calming skills to reduce overall anxiety Learn and implement problem solving strategies Identify and engage in pleasant activities Learning and implement personal and interpersonal skills to reduce anxiety and improve interpersonal relationships Learn to accept limitations in life and commit to tolerating, rather than avoiding, unpleasant emotions while accomplishing meaningful goals Identify major life conflicts from the past and present that form the basis for present anxiety Learn and implement behavioral strategies Verbalize an understanding and resolution of current interpersonal problems Learn and implement problem solving and decision making skills Learn and implement conflict resolution skills to resolve interpersonal problems Verbalize an understanding of healthy and unhealthy emotions verbalize insight into how past relationships may be influence current experiences with depression Use mindfulness and acceptance strategies and increase value based behavior  Increase hopeful statements about the future.   Interventions Teach about stress and ways to handle stress Assist the patient in developing a coping action plan for stressors Conduct skills based training for coping strategies Train problem focused skills Sort out what activities the individual can do Encourage patient to rely upon his/her spiritual faith Teach the patient to use guided imagery Probe and evaluate family's ability to provide emotional support Allow family to share their  fears Assist the patient in identifying, sorting through, and verbalizing the various feelings generated by his/her medical condition Meet with family members  Ask patient list out limitations  Use stress inoculation training  Use Acceptance and Commitment Therapy to help client accept uncomfortable realities in order to accomplish value-consistent goals Reinforce the client's insight into the role of his/her past emotional pain and present anxiety  Discuss examples demonstrating that unrealistic worry overestimates the probability of threats and underestimate patient's ability  Assist the patient in analyzing his or her worries Help patient understand that avoidance is reinforcing  Behavioral activation help the client explore the relationship, nature of the dispute,  Help the client develop new interpersonal skills and relationships Conduct Problem so living therapy Teach conflict resolution skills Use a process-experiential approach Conduct TLDP Conduct ACT  The patient and clinician reviewed the treatment plan on 01/26/2022. The patient approved of the treatment plan.    Conception Chancy, PsyD

## 2022-04-12 ENCOUNTER — Encounter: Payer: Self-pay | Admitting: Internal Medicine

## 2022-04-13 NOTE — Telephone Encounter (Signed)
Dr. Chase Caller please advise on the following My Chart message and if you would like [place] to place referral for counseling and what dx you would like [place] to use.  Dustin Dean  P [facility] (supporting Brand Males, MD)10 hours ago (9:50 PM)   KR Dr. Chase Caller, I had my right heart cath this past Wednesday and the results are available. Dr. Radford Pax said there is a minimal amount of pulmonary hypertension. Dustin Dean, who was referred to me for counseling, is leaving his job and I will need a new counselor. Last week I had my last visit with him. How soon do you think you can refer someone Dean? Thank you very much. Dustin Dean  Thank you

## 2022-04-13 NOTE — Telephone Encounter (Signed)
  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 If he has questions, needs vidoe visit with me or with APP   xxxxxxxxxxxxxxxxxxxxxxxxxx FINDINGS:  Hemodynamics 1. Right atrial pressure mean of 7 mmHg. 2. Right ventricular pressure 41/8 mmHg. 3. Pulmonary artery pressure 38/17 mmHg with a mean of 27 mmHg. 4. Pulmonary capillary wedge pressure 12 mmHg. 5. Pulmonary artery saturation 64.3%. 6. Aortic saturation 88%. 7. Calculated cardiac output of 5.5 L/minute by Fick with an index of 2.7 L/minute/m2. 8.  Pulmonary vascular resistance 231 dsc^-5 -. Per DR Radford Pax this is 3.5 Dustin Dean IUnit  CONCLUSIONS: Successful right heart catheterization Mild pulmonary hypertension Normal intracardiac filling pressures  RECOMMENDATIONS: Further disposition per the patient's primary pulmonologist.   The imaging is on file and stored in a permanent location.

## 2022-04-16 NOTE — Telephone Encounter (Signed)
Explained to patient how to get started on Tyvaso, will await his response.   Patient wanted to know if MR had any recommendations for a counselor. He is established with Conception Chancy but he is leaving soon.   MR, can you please advise? Thanks!

## 2022-04-17 NOTE — Telephone Encounter (Signed)
Too bad [name] is leaving.  Did he give any recommendations?  I have reached out to my sources inside the psychology practice to get other recommendations.  Please send this message back to me after informed the patient

## 2022-04-28 ENCOUNTER — Encounter: Payer: Self-pay | Admitting: Internal Medicine

## 2022-05-02 ENCOUNTER — Telehealth: Payer: Self-pay | Admitting: Pharmacist

## 2022-05-02 NOTE — Telephone Encounter (Signed)
Pt has spoken with office about medication since this phone call. Will close encounter.

## 2022-05-02 NOTE — Telephone Encounter (Signed)
Received new start paperwork for Tyvaso. Patient will be started on nebulization formulation. Submitted completed referral paperwork to [facility] for TYVASO along with HRCT, PFT, right heart cath results, and most recent progress note.  Fax# [phone] Phone# [phone]  Chesley Mires, PharmD, MPH, BCPS, CPP Clinical Pharmacist (Rheumatology and Pulmonology)

## 2022-05-09 NOTE — Telephone Encounter (Signed)
Received email from Vickie, [facility] rep, that patient has been cleared to start Tyvaso. They will be working with patient to get care started and nurse home visit scheduled. Will continue to f/u in [facility] portal  Chesley Mires, PharmD, MPH, BCPS, CPP Clinical Pharmacist (Rheumatology and Pulmonology)

## 2022-05-14 ENCOUNTER — Ambulatory Visit (INDEPENDENT_AMBULATORY_CARE_PROVIDER_SITE_OTHER): Payer: Medicare Other | Admitting: Behavioral Health

## 2022-05-14 DIAGNOSIS — F331 Major depressive disorder, recurrent, moderate: Secondary | ICD-10-CM | POA: Diagnosis not present

## 2022-05-15 ENCOUNTER — Encounter: Payer: Self-pay | Admitting: Behavioral Health

## 2022-05-15 NOTE — Progress Notes (Signed)
[facility] Counselor Initial Adult Exam  Name: Dustin Dean Date: 05/15/2022 MRN: [id number] DOB: 02-28-1950 PCP: Dustin Canterbury, MD  Time spent: 58 minutes  Guardian/Payee: Self  Paperwork requested: Yes   Reason for Visit /Presenting Problem: Depression Dustin Dean is a 72 year old married male who presents with symptoms of depression.  He previously saw Dustin Dean a therapist and another one of her offices.  He transferred to this office when Dustin Dean left the practice.  6 years ago he was diagnosed with pulmonary fibrosis.  He was eventually referred to a specialist for treatment but found support through a [facility]in Dean support group of people with similar diagnoses.  The leader of that group referred him to Dustin Dean whom he is currently seeing.  For years he says he has felt like he has lived with fairly well with no regrets but the last 2 months he recognizes that maybe he is not handling it as well as he would like to.  He tried 1 medication initially but did not handle it well but now is on 2 medications including a nebulizer which he feels has slowed the progression of the disease.  His doctor also recommended physical therapy which he did for a while but is not currently doing well.  He says he does not feel that death is imminent which she is thankful for.  He also has aortic stenosis which is cardiologist is watching closely.  A procedure was recommended and completed last year which had minimal side effects that he has had much better results in terms of breathing and heart rate irregularity.  What led him to therapy was that his wife acknowledged that he was becoming harder to get along with.  He has 1 sister who visited over the holidays and called the patient belligerent which she said hit him pretty hard.  He recognizes an increase in irritability said that his father was belligerent and that term did not sit well with him but does not want to be like that.  He says he is  starting to have some regrets for things he has done or said but wants to focus on the more as a way of making them change.  His sister also said that he has unresolved issues and 1 of those issues is his nephew whom he describes as a big stressor.  The nephew lives in [place] so he very rarely sees them but that he allows the nephew to have too much space in his head about things that have gone on and go back at least 20 years.  He wants to feel that those issues can be resolved.  He cited a couple of examples where he was irritable with his wife and only a few short time later she was of red and had forgiven him but he feels bad about those situations.  He recognizes a history of irritability and anger from his father's side.  He said that his father could be very belligerent.  The patient said that the father made his and his family's lives of miserable and he was not very good to them.  When his father became sick later in life he let his father move in with he and his wife around 2006 but his father had not changed much.  He lived with him for 6 years and said the entire time his father was negative and irritable.  His father eventually moved to an assisted living facility in [place] close to where his sister lived.  He feels that his sister is a little bit more of how his father was and as an adult.  He also said his sister does not recognize the discord between he and his nephew.  The patient and his wife have no children.  When they were in their 30s and talked about children adoption was too expensive but when they tried to adopt through [facility]in [facility] processes or too many hoops that she will do to make that happen so they just did not have children.  They could not do IVF because of cost.  In his 30s the patient was volunteering through the school system in [place] where they lived particularly in middle school.  When they moved to [place] he started volunteering in [facility] and said that he  met a young man who was a [employer] member there in 2013 and the patient and his wife became his "adopted" parents.  She said they were just a good fit early on and they have developed a strong relationship through that young man getting married and having children.  They act as surrogate grandparents and parents to the family and that is very satisfying to them.  He says he is not irritable and angry with them.  He says his supports are his faith in his church send interval COVID attended church regularly.  He now watches on TV but would like to be able to go back to church.  He says they have great neighbors and [place] where they live especially with her backdoor friends.  He is neighbors have a house at [place] [place] and allow the patient and his wife to use it which she says is good for him medically.  He retired at the end of 2014 after being in [employer] for over 30 years.  He had a part-time job that he did them but it ended in COVID and it could not be done from home.  He was working for the [employer] which he enjoyed thoroughly.  He has not worked again since then.  He spends his time now on the computer on [employer] working in the yard as much as he is physically able.  He does have to wear a mask when he works in the yard he also enjoys spending time with the dog and says his neighbor's dog and his dog play well together though they spend a lot of time with their neighbors.  For goals he wants to work on understanding the triggers for his irritability and belligerence including his history and healthy ways to cope with those.  He says he does not want to feel guilt for the way he treats his wife and wants to learn skills for being mindful about how he responds to her.  They have a strong marriage.  He does contract for safety having no thoughts of hurting himself or anyone else. Mental Status Exam: Appearance:   Fairly Groomed     Behavior:  Appropriate  Motor:  Normal   Speech/Language:   Clear and Coherent and Normal Rate  Affect:  Appropriate  Mood:  normal  Thought process:  normal  Thought content:    WNL  Sensory/Perceptual disturbances:    WNL  Orientation:  oriented to person, place, time/date, situation, and day of week  Attention:  Good  Concentration:  Good  Memory:  WNL  Fund of knowledge:   Good  Insight:    Good  Judgment:   Good  Impulse Control:  Good     Reported Symptoms: Depression  Risk Assessment: Danger to Self:  No Self-injurious Behavior: No Danger to Others: No Duty to Warn:no Physical Aggression / Violence:No  Access to Firearms a concern: No  Gang Involvement:No  Patient / guardian was educated about steps to take if suicide or homicide risk level increases between visits: n/a While future psychiatric events cannot be accurately predicted, the patient does not currently require acute inpatient psychiatric care and does not currently meet [facility] involuntary commitment criteria.  Substance Abuse History: Current substance abuse: No     Past Psychiatric History:   Previous psychological history is significant for depression Outpatient Providers: Primary care physician History of Psych Hospitalization:  None reported Psychological Testing:  n/a    Abuse History:  Victim of: No.,  None reported    Report needed: No. Victim of Neglect:No. Perpetrator of n/a Witness / Exposure to Domestic Violence: None reported  Protective Services Involvement: No  Witness to [employer] Violence:  No   Family History:  Family History  Problem Relation Age of Onset   Cancer Mother    Congestive Heart Failure Father    Prostate cancer Father    Healthy Sister     Living situation: the patient lives with their spouse  Sexual Orientation: Straight  Relationship Status: married  Name of spouse / other: Did not discuss If a parent, number of children / ages: Adults  Support Systems: spouse  Financial Stress:   No   Income/Employment/Disability: Doctor, general practice: No   Educational History: Education: high school diploma/GED  Religion/Sprituality/World View: Did not discuss  Any cultural differences that may affect / interfere with treatment:  not applicable   Recreation/Hobbies: Time with family  Stressors: Dean problems    Strengths: Supportive Relationships, Hopefulness, Self Advocate, and Able to Communicate Effectively  Barriers:     Legal History: Pending legal issue / charges: The patient has no significant history of legal issues. History of legal issue / charges:  Not applicable  Medical History/Surgical History: reviewed Past Medical History:  Diagnosis Date   Aortic stenosis    Flu    Pneumonia    Prostate cancer (HCC)    Pulmonary fibrosis (HCC)    Skin cancer     Past Surgical History:  Procedure Laterality Date   HERNIA REPAIR     NASAL SEPTUM SURGERY     SKIN CANCER EXCISION      Medications: Current Outpatient Medications  Medication Sig Dispense Refill   acetaminophen (TYLENOL) 325 MG tablet Take by mouth as needed.     aspirin-acetaminophen-caffeine (EXCEDRIN MIGRAINE) 250-250-65 MG tablet Take by mouth as needed.     Calcium Carb-Cholecalciferol (CALCIUM/VITAMIN D PO) Take 1 tablet by mouth daily.     Cetirizine HCl (ZYRTEC PO) Take by mouth.     ESBRIET 801 MG TABS Take 1 tablet (801 mg total) by mouth 3 (three) times daily with meals. 270 tablet 1   fluorouracil (EFUDEX) 5 % cream Apply topically.     fluticasone (FLONASE) 50 MCG/ACT nasal spray Place into both nostrils daily.     gabapentin (NEURONTIN) 300 MG capsule Take 300 mg by mouth 3 (three) times daily.     ipratropium (ATROVENT) 0.06 % nasal spray Place 2 sprays into both nostrils 3 (three) times daily.     loperamide (IMODIUM) 2 MG capsule Take 1 mg by mouth once.     meclizine (ANTIVERT) 25 MG tablet Take 25 mg by mouth  2 (two) times daily as needed.      Multiple Vitamin (MULTIVITAMIN) capsule Take 1 capsule by mouth every morning.     naproxen sodium (ALEVE) 220 MG tablet Take 220 mg by mouth daily as needed.     omeprazole (PRILOSEC) 40 MG capsule Take 40 mg by mouth daily.     PAROXETINE HCL PO Take by mouth.     rosuvastatin (CRESTOR) 5 MG tablet Take 1 tablet by mouth daily.     SUMAtriptan (IMITREX) 100 MG tablet TAKE ONE TABLET BY MOUTH AT ONSET OF HEADACHE; MAY REPEAT ONE TABLET IN 2 HOURS IF NEEDED. MAX OF 200 MG PER DAY     No current facility-administered medications for this visit.    Allergies  Allergen Reactions   Bee Venom Rash   Shellfish Allergy Hives   Nintedanib Other (See Comments)    SIRS, fevers   Pseudoephedrine Anxiety    Diagnoses:  Major depressive disorder, recurrent, moderate  Plan of Care: I will meet with the patient every 2 to 3 weeks in person   [place] Ana, [facility]

## 2022-05-15 NOTE — Progress Notes (Signed)
                Kacie Huxtable M Willye Javier, LCMHC 

## 2022-05-16 NOTE — Telephone Encounter (Signed)
Tyvaso neb starter kit has been shipped to patient on 05/14/22. [facility] nurse home visit is scheduled for 05/22/2022  Chesley Mires, PharmD, MPH, BCPS, CPP Clinical Pharmacist (Rheumatology and Pulmonology)

## 2022-05-22 ENCOUNTER — Ambulatory Visit (INDEPENDENT_AMBULATORY_CARE_PROVIDER_SITE_OTHER): Payer: Medicare Other | Admitting: Internal Medicine

## 2022-05-22 ENCOUNTER — Encounter: Payer: Self-pay | Admitting: Internal Medicine

## 2022-05-22 VITALS — BP 124/71 | HR 71 | Ht 68.0 in | Wt 199.0 lb

## 2022-05-22 DIAGNOSIS — Z5181 Encounter for therapeutic drug level monitoring: Secondary | ICD-10-CM | POA: Diagnosis not present

## 2022-05-22 DIAGNOSIS — R768 Other specified abnormal immunological findings in serum: Secondary | ICD-10-CM

## 2022-05-22 DIAGNOSIS — J849 Interstitial pulmonary disease, unspecified: Secondary | ICD-10-CM

## 2022-05-22 LAB — PULMONARY FUNCTION TEST
DL/VA % pred: 103 %
DL/VA: 4.25 ml/min/mmHg/L
DLCO cor % pred: 58 %
DLCO cor: 14.38 ml/min/mmHg
DLCO unc % pred: 57 %
DLCO unc: 14.22 ml/min/mmHg
FEF 25-75 Pre: 2.72 L/sec
FEF2575-%Pred-Pre: 116 %
FEV1-%Pred-Pre: 59 %
FEV1-Pre: 1.81 L
FEV1FVC-%Pred-Pre: 121 %
FEV6-%Pred-Pre: 51 %
FEV6-Pre: 2.02 L
FEV6FVC-%Pred-Pre: 106 %
FVC-%Pred-Pre: 48 %
FVC-Pre: 2.02 L
Pre FEV1/FVC ratio: 90 %
Pre FEV6/FVC Ratio: 100 %

## 2022-05-22 NOTE — Progress Notes (Signed)
Spiro/DLCO performed today.  

## 2022-05-22 NOTE — Progress Notes (Signed)
Cardiology Dr. Loni Beckwith 02/15/20 Dustin Dean is 72 y.o. year old male with a history of No prior cardiovascular disease who presents to [place] cardiology for the evaluation of a heart murmur. This patient is a non-smoker and denies a previous history of myocardial infarction, congestive heart failure, or valvular heart disease. 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 He really has little in the way of exertional chest symptoms. Occasionally according to the wife if he walks up an incline briskly he may have some minor shortness of breath but remains active and has little in the way limitation. He denies a previous history of myocardial infarction, congestive heart failure, or known valvular heart disease. At a recent examination he was found to have a heart murmur and an echocardiogram was performed. It demonstrated a peak gradient across his aortic valve of 30 mmHg and an aortic valve area of 1.1 cm. This was read as being consistent with moderate aortic stenosis. I had a fairly significant discussion with the patient and his wife about the implications of his aortic valve and the need to follow the aortic stenosis progression over time.  11/14/2020 office visit with Dustin Dean pulmonary physician assistant at [facility] has past medical history of restrictive lung disease, interstitial lung disease, allergic rhinitis, laryngeal pharyngeal reflux, arthritis, lung nodules and persistent cough. He is a non-smoker and has never smoked. He has medication allergy to pseudoephedrine.  Mr. [name] presents today with complaint of a progressively worsening shortness of breath and dyspnea on exertion over the past several months. We have been following his interstitial lung disease since 2018. He has been stable without complaint of dyspnea on exertion over the past several years. He indicates that recently he has had more shortness  of breath with exertion. He and his wife will walk the dog in their usual route through the neighborhood that they have done for many years. He reports that he will get short of breath along the root in a way that he has never been bothered with previously. He has recently requested an albuterol inhaler and reports that it has been helpful. He has been using Symbicort 160/4.5 twice daily but has been having difficulty using it successfully as he has been coughing significantly and loses a lot of the medication. A recent chest CT conducted 10/24/2020 notes a progression in the interstitial changes. A 9 mm right lower lobe nodule is also noted. The plan is to have a 29-monthfollow-up chest CT conducted 02/04/2021. A 6-minute walk test conducted today shows oxygen desaturation and need for supplemental oxygen. I will place the order for supplemental oxygen. [facility]   Patient at rest on RA with baseline sats : 97% After exertion for 3 min on RA pt's sats were : 86% Patient placed on oxygen via nasal cannula at 2 lpm flow via portable oxygen concentrator device Exertion for 6 mins with oxygen confirmed final sats at 94%   #1 lung nodules Chest CT 10/24/2020 shows 9 mm right lower lobe lung nodule Follow-up chest CT scheduled for 375-montheevaluation in January 2023  2. Interstitial lung disease Chest CT 11/04/2020 notes fibrotic interstitial lung disease with evidence of progression compared to 08/28/2020 [facility]   #Primary  Care dec 2022 Osteopenia Due for DXA. Prostate cancer Saw Dr. Louis Meckel at [facility]. Biopsy + for cancer. They are just watching it. He is going to have MRI and another biopsy next visit.   OV 01/24/2021 -evaluation and transfer of care to Dr. Chase Caller in ILD center in [place].  Referred by  Dustin Dean patient support group leader for the pulmonary fibrosis foundation support group  Subjective:  Patient ID: Dustin Dean, male , DOB: 08/26/1950 , age 34 y.o. , MRN: [id number] , ADDRESS: [place] [address] PCP Joneen Boers, MD Patient Care Team: Joneen Boers, MD as PCP - General (Family Medicine)  This Provider for this visit: Treatment Team:  Attending Provider: Brand Males, MD    01/24/2021 -   Chief Complaint  Patient presents with   Consult    Pt is here for a switch of providers to take over care for his pulmonary fibrosis. 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 Pt does become SOB with activities and also states that he does have a chronic cough.     HPI  Dustin Dean 72 y.o. -new evaluation for interstitial lung disease.  History is provided by review of the chart and also talking to the patient and his wife.  He tells me that he has had chronic cough since his teenage years.  But for the last few to several years its been more consistent.  He was seeing the physician assistant at [facility].  Somewhere along the way he got referred to Dr. Dennison Mascot right at [facility] some 3 years ago.  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  After that cough is largely improved except for occasional exacerbations in the fall in the winter.  Then in 2018 sometime after he went on gabapentin he was informed by the physician assistant that he might have early ILD.  He does not recollect any serology or biopsy being done.  He does recollect a few pulmonary function test.  Then all of a sudden starting September 2022 his cough "got out of hand" and also developed worsening shortness of breath.  This the first time he started developing worsening shortness of breath.  Then by November 2022 started needing exertional oxygen leading to current symptoms.  He was referred to [facility] but he preferred to establish with the  ILD center here in [place] with [place] based on [facility] recommendation.  He found Dustin Dean through Internet search for pulmonary fibrosis foundation.  His current symptom score is below.  He is not on any antifibrotic.  [place] Integrated Comprehensive ILD Questionnaire       Past Medical History :  -He has obesity BMI 33-34 - Denies any collagen vascular disease - He is known to have aortic stenosis -sees cardiologist at [facility].  December 2021 echocardiogram EF 55 to 60% with tricuspid aortic valve and moderate stenosis normal right ventricle -Denies any asthma or COPD.  Denies any heart failure denies any collagen vascular disease -Denies snoring or excessive daytime somnolence.  Denies stroke.  Denies formal diagnosis of sleep apnea denies formal diagnosis of pulmonary hypertension [echo a year ago did not show pulmonary hypertension] -Has had vaccination against COVID but has not had COVID disease -Recent diagnosis of late 2022 early stage prostate cancer at [facility] urology on observation treatment -Has history of skin cancer for which he apply sunscreen regularly  ROS:  -Shortness of breath present Cough  chronic plus -Has intermittent chronic diarrhea and takes Imodium but no other GI side effects -Possible Raynaud's for the last several decades  FAMILY HISTORY of LUNG DISEASE:  Denies family for pulmonary fibrosis.  Denies family Struve COPD or asthma sarcoid or cystic fibrosis.  Denies family history of hypersensitive pneumonitis.  Denies autoimmune disease.  Denies any premature graying of the hair or albinism  PERSONAL EXPOSURE HISTORY:  No prior cigarette smoking -No marijuana use of cocaine use no intravenous drug use.  HOME  EXPOSURE and HOBBY DETAILS :  -Single-family home in a one-story space.  [place] setting age of the current home is 31 years.  He is lived there for 6 years.  He does use a feather pillow and this occasionally mold or mildew in the  bathroom.  All his life he is done active gardening with mulch and woodchips and occasionally damp soil.  He is worked out in the yard quite a bit and does gardening.  He enjoys the gardening.  OCCUPATIONAL HISTORY (122 questions) : -He worked in the [employer] at [employer] and then [employer] no [employer].  He does not remember if the buildings had mold in it.  He does not think so -Detail greater than 100 question organic and inorganic antigen history exposure is negative  PULMONARY TOXICITY HISTORY (27 items):  Denies  INVESTIGATIONS: Report of pulmonary function test but I am not able to get it in Care Everywhere       High-resolution CT scan of the chest August 2020  -There is a report of honeycombing but without any craniocaudal gradient.  Air-trapping reported alternative pattern more suggestive of chronic hypersensitive pneumonitis.  But there is already progression from August 2019 -Read by Dr. Vinnie Langton   CT Chest data- HRCT 11/04/20 unable for my visualization.   CLINICAL DATA:  Worsening shortness of breath   EXAM:  CT CHEST WITHOUT CONTRAST   TECHNIQUE:  Multidetector CT imaging of the chest was performed following the  standard protocol without intravenous contrast. High resolution  imaging of the lungs, as well as inspiratory and expiratory imaging,  was performed.   COMPARISON:  Chest CT dated August 29, 2018   FINDINGS:  Cardiovascular: Normal heart size. No pericardial effusion. Coronary  artery calcifications of the circumflex. Aortic valve  calcifications. Minimal calcified plaque of the thoracic aorta.   Mediastinum/Nodes: Mildly enlarged mediastinal lymph nodes,  unchanged compared to prior exam and likely reactive. Esophagus and  thyroid are unremarkable.   Lungs/Pleura: Central airways are patent. Bilateral air trapping.  Peribronchovascular and subpleural reticular opacities with traction  bronchiectasis and  no clear craniocaudal predominance. Honeycomb  change primarily seen in the anterior upper lobes is increased when  compared to prior exam. New linear nodular opacity of the right  lower lobe measuring up to 9 mm on series 4, image 210.   Upper Abdomen: No acute abnormality.   Musculoskeletal: No chest wall mass or suspicious bone lesions  identified.   IMPRESSION:  Fibrotic interstitial lung disease with evidence of progression when  compared with August 29, 2018 prior exam. Favor fibrotic  hypersensitivity pneumonitis given presence of air trapping.  Findings are suggestive of an alternative diagnosis (not UIP) per  consensus guidelines: Diagnosis of Idiopathic Pulmonary Fibrosis: An  Official ATS/ERS/JRS/ALAT Clinical Practice Guideline. [place], Iss 5, [phone], Sep 15 2016.   New linear nodular opacity of the right lower lobe measuring up  to 9  mm, likely an area of focal fibrosis. Recommend follow-up chest CT  in 3 months to ensure stability.   Aortic Atherosclerosis (ICD10-I70.0).   Addendum 01/26/2021 -radiology Dr. Weber Cooks wrote back saying CT scan is classic for chronic HP.  We will call the patient and get the patient started on nintedanib counseling which is first-line for progressive non-- IPF ILD.  If he is reluctant then we will do pirfenidone.  We will set up pharmacy counseling.  If he is reluctant for nintedanib based on risk versus benefit we will do pirfenidone  Electronically Signed    By: Yetta Glassman M.D.    On: 11/04/2020 14:17  No results found.   Phone visit 01/28/21  Emilyh  LEt Dustin Dean know ahead of the visit followup with me on 02/23/21   A) making referral to [facility] to discuss ofev which would be first line baed on what radiologist told me about CT and fact rheumaotid facot is positive  B) rheumatoid factor strongly positive - refer rheumatology Dr Deveshwar/Rice  C) I reviewed echo report (below) Will discuss  at followup  Aortic Valve: There is moderate stenosis, with peak and mean gradients  of 44.000 and 27.000 mmHg.  Aortic valve area calculates to approximately  1.4 cm    Mitral Valve: There is mild regurgitation.    Tricuspid Valve: There is mild regurgitation.    Tricuspid Valve: The right ventricular systolic pressure is normal (<36  mmHg).   1.  Adequate 2D M-mode and color flow Doppler echocardiogram demonstrates  normal left ventricular chamber size and contractility.  There may be mild  left ventricular hypertrophy.  Ejection fraction is estimated 65%  2.  The aortic valve is thickened and demonstrates reduced leaflet  mobility.  There is mild to moderate aortic stenosis with aortic valve  area calculating to 1.4 cm  3.  Mild mitral annular calcification is noted but there is good leaflet  mobility.  Mild mitral insufficiency is noted  4.  Tricuspid valve appears to be normal  5.  The atria are grossly normal size bilaterally  6.  The right ventricular chamber size and function is normal  6.  The pericardium is of normal thickness there is no effusion  OV 02/23/2021  Subjective:  Patient ID: Dustin Dean, male , DOB: [birth year] , age 85 y.o. , MRN: [id number] , ADDRESS: [place] [id number] PCP Joneen Boers, MD Patient Care Team: Joneen Boers, MD as PCP - General (Family Medicine)  This Provider for this visit: Treatment Team:  Attending Provider: Brand Males, MD    02/23/2021 -   Chief Complaint  Patient presents with   Follow-up    Pt recently had a HRCT and PFT and is here today to discuss the results.   Chronic cough on gabapentin  Interstitial lung disease work-up in progress -concern for hypersensitive pneumonitis  -Progressive phenotype between August 2020 and October 2022  9 mm right lower lobe nodule seen in October 2022 for the first time: Stable January 2023  HPI Jatavian Konopka 72 y.o. -since his last visit Dr. Weber Cooks the  radiologist did look at the CT scan and this confirmed his CT is not consistent with UIP but more likely an alternate pattern consistent with hypersensitive pneumonitis as seen by air trapping.  Patient has had serology work-up in which it is all negative except rheumatoid factor strongly positive.  He is yet to see the rheumatologist.  He does have significant  restrictions on the PFTs with reduced DLCO consistent with his obesity and ILD.  His symptoms are overall stable.  Did go through his exposure history again.  He says in 200 07/2006 and again in 2012 he had had 3 episodes of pneumonia.  All of this happened when he entered school buildings.  He was living in the same house at that time but the house itself was brand-new and there is no mold or mildew.  He is wondering about crawlspace because only [place] he has seen crawlspace but he said he has never been in the crawl space.  He does not know if there was mold in the crawl space.  I told him it is doubtful that mold or mildew from the crawlspace can get into the house.  He did have a feather pillow but since his last visit he is thrown that out.  He does do gardening and does get exposed to dampness.  At this point in time did discuss with him that he does have ILD and it is progressive.  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  Unclear if the positive rheumatoid factor is playing a role it could be if he has positive for rheumatoid arthritis.  Did indicate that ultimately only a biopsy can put to rest if he has IPF [being Caucasian gentleman greater than 65 is classic phenotype for IPF] but did indicate to him that given progression does not matter whether it is IPF or non-IPF antifibrotic's is indicated.  He is already met with the pharmacist and discussed the 2 antifibrotic's.  Did indicate to him that nintedanib is first-line and non-- IPF progressive phenotype.  Did discuss about the diarrhea.  Denies any  contraindication.  Therefore we will start this.  Did discuss whether diarrhea is willing to go through this.  Also discussed about ILD-Pro registry.  He is interested given the consent form.  Did indicate to him that it probably would be beneficial to do some kind of a limited work-up in differentiating his etiology.  The reasons would be to participate in clinical trials but also in case he needs immunosuppression such as prednisone or CellCept [indicated for hypersensitive pneumonitis but not for IPF] having a specific diagnosis would be beneficial.  I did think that his obesity puts him at some risk for getting surgical lung biopsy and therefore it might be better to start off with bronchoscopy with lavage and transbronchial biopsies.  We can address this in the future.  This visit was just focused on therapies.  Regarding his 9 mm lower lobe nodule: He had a CT scan of the chest and shows this is stable from October 2022 through January 2023.  The CT scan again reads as alternative pattern consistent with chronic HP.    CT Chest data January 2023  No results found.  Mediastinum/Nodes: Multiple prominent borderline but nonenlarged mediastinal and bilateral hilar lymph nodes, similar to the prior study esophagus is unremarkable in appearance. No axillary lymphadenopathy.   Lungs/Pleura: High-resolution images again demonstrate widespread but patchy areas of ground-glass attenuation, septal thickening, subpleural reticulation, thickening of the peribronchovascular interstitium, cylindrical and varicose traction bronchiectasis, peripheral bronchiolectasis and extensive honeycombing. There is no discernible craniocaudal gradient. The most extensive areas of honeycombing are anterior lung, predominantly in the upper lobes and right middle lobe. Some areas of the lung bases demonstrate relative sparing. Inspiratory and expiratory imaging demonstrates mild to moderate air trapping indicative of  small airways disease. No definite progression  compared to the recent prior study. No acute consolidative airspace disease. No pleural effusions. Previously described nodular area of architectural distortion in the right lower lobe (axial image 106 of series 8) is stable measuring 8 mm on today's examination, likely part of the underlying fibrosis. No other definite suspicious appearing pulmonary nodules or masses are noted.   Upper Abdomen: Unremarkable.   Musculoskeletal: There are no aggressive appearing lytic or blastic lesions noted in the visualized portions of the skeleton.   IMPRESSION: 1. 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 2. Previously noted nodular area of architectural distortion in the right lower lobe is unchanged, likely part of the fibrosis rather than a pulmonary nodule. 3. There are calcifications of the aortic valve and mitral annulus. Echocardiographic correlation for evaluation of potential valvular dysfunction may be warranted if clinically indicated. 4. Aortic atherosclerosis, in addition to 2 vessel coronary artery disease. Please note that although the presence of coronary artery calcium documents the presence of coronary artery disease, the severity of this disease and any potential stenosis cannot be assessed on this non-gated CT examination. Assessment for potential risk factor modification, dietary therapy or pharmacologic therapy may be warranted, if clinically indicated.   Aortic Atherosclerosis (ICD10-I70.0).     Electronically Signed   By: Vinnie Langton M.D.   On: 02/14/2021 06:17 stable January 2023  PFT       Latest Reference Range & Units 01/27/21  15:53 01/27/21 15:54  Anti Nuclear Antibody (ANA) NEGATIVE   NEGATIVE  Angiotensin-Converting Enzyme 9 - 67 U/L  49  Anti JO-1 0.0 - 0.9 AI  [id number]  Cyclic Citrullin Peptide Ab UNITS  <16  ds DNA Ab IU/mL  1  ENA RNP Ab 0.0 - 0.9 AI  0.4  RA Latex Turbid. <14 IU/mL  323 (H)  SSA (Ro) (ENA) Antibody, IgG <1.0 NEG AI  <1.0 NEG  SSB (La) (ENA) Antibody, IgG <1.0 NEG AI  <1.0 NEG  Scleroderma (Scl-70) (ENA) Antibody, IgG <1.0 NEG AI  <1.0 NEG  QUANTIFERON-TB GOLD PLUS   Rpt  A.Fumigatus #1 Abs Negative  Negative   Micropolyspora faeni, IgG Negative  Negative   Thermoactinomyces vulgaris, IgG Negative  Negative   A. Pullulans Abs Negative  Negative   Thermoact. Saccharii Negative  Negative   Pigeon Serum Abs Negative  Negative   (H): Data is abnormally high     OV 03/23/2021- acute video visit due to concerns for acute symptoms ? Ofev side effect  Subjective:  Patient ID: Dustin Dean, male , DOB: 1951/01/01 , age 47 y.o. , MRN: [id number] , ADDRESS: [place] [id number] PCP Joneen Boers, MD Patient Care Team: Joneen Boers, MD as PCP - General (Family Medicine)  This Provider for this visit: Treatment Team:  Attending Provider: Brand Males, MD  Type of visit: Video Circumstance: COVID-19 national emergency Identification of patient Soliman Puleio with 1950-10-17 and MRN [id number] - 2 person identifier Risks: Risks, benefits, limitations of telephone visit explained. Patient understood and verbalized agreement to proceed Anyone else on call: just patient Patient location: Inpatient bed at [facility] which is another health system This provider location: [place]. pulmonary office [address]  [place], [place] [address].   03/23/2021 -in this video visit was set up on  acute basis because he started having symptoms after starting nintedanib.  He wanted to know if it was nintedanib symptoms.  The symptoms sound rather complex and little bit  out of proportion for nintedanib.  Therefore I scheduled this video visit and to my surprise he is sitting in a hospital bed at [facility].    HPI Kyre Olshansky 72 y.o. - started ofev mid-feb 2023. Started noticing feeling disoriented and at times weak. Then on 3/4-03/19/21 was feeling very weak whole bidy weakness and needed walker to even go across room. Tired. Stopped going to gym. Was using o2 all the time. Then on 03/21/21 feelign cold. ANd ahd fever  102F.  Does not remember Tuesday much. Initialyt Dx is pneumonia and testing/imaging -> by tthen pccm saw him Dr Dan Europe. Procal negative. WBC normal. Concerns is ILD flare up and recommendation is prednisone.  CT scan did not show pneumonia Curently on IV solumedrol. Currently some better. Yesterday ate well. Currently on 2.5LN . He has been covid negative. Multiplex resp virus panel negative per outside chart revie  With ofev was having some nausea. No abd pain. No diarrhea. No stomach cramps. No vomitting   Currently ofev on hold    03/28/2021 Follow up : ILD , Post hospital follow up  Patient returns for a 1 week follow-up visit.  Patient was seen in January 2023 for second opinion for interstitial lung disease.  Patient was felt to have possible concern for hypersensitivity pneumonitis.  He has had a progressive phenotype noted on CT scan from August 2020 to October 2022.  Serology was negative except for a strongly positive rheumatoid factor.  He was referred to rheumatology.  PFT showed significant restriction and reduced DLCO.  Has a history of recurrent pneumonia.  He was recommended to begin antifibrotic's. With OFEV.  Patient was hospitalized last week with severe dyspnea, weakness, activity intolerance to point he could not walk. Confusion, low oxygen levels, and fever-tmax 102. Kristeen Miss he has a viral illness and ILD flare.  Patient was hospitalized at an outlying hospital.  He was treated with steroids.  Discharged on a  steroid taper. CT chest 03/22/21 report showed fibrotic ILD, stable since 10/2020, no acute consolidation.  Covid , Influezna  and Viral panel were neg.  Since getting out of the hospital patient is feeling much better. Energy level has picked, decreased oxygen demands, no fever. Appetite is some better . Patient is concerned this could have been from [facility] as his symptoms started when he started OFEV.  Is on Oxygen with activity and At bedtime  2l/m . At rest O2 sats are >90%.      MDD 03/28/21 MDD Impression/Recs: Recent flare up. Bx can be prohibitive. get rheum eval and if neg - manage as HP. Maybe at the most BAL if stable  OV 04/05/2021  Subjective:  Patient ID: Dustin Dean, male , DOB: Oct 26, 1950 , age 4 y.o. , MRN: [id number] , ADDRESS: [place] [id number] PCP Joneen Boers, MD Patient Care Team: Joneen Boers, MD as PCP - General (Family Medicine)  This Provider for this visit: Treatment Team:  Attending Provider: Brand Males, MD    04/05/2021 -   Chief Complaint  Patient presents with   Follow-up    Pt states he is feeling better after last hospital stay and states each day is getting better.   Chronic cough on gabapentin  Interstitial lung disease work-up in progress -concern for hypersensitive pneumonitis  -  Progressive phenotype between August 2020 and October 2022   - started ofev mid feb 2023 -> early march 2023 admitted for ILD flare  9 mm right lower lobe nodule seen in October 2022 for the first time: Stable January 2023  HPI Khalen Betton 72 y.o. -returns for followup. He is on 1.5 tab of pred curently . Next week is 1 tab pred and off. He is better. Feeling better. AT rest not using o2 because he is better. In fact symp score shows huge imrpovement. Walking desat test is also largey imprpvd. All ? Steroid effects. We had conversation about his recent flare up  - has hx of recurrent pna: 2005-2006 x 2 episodes. . Lived in a different  house. Then again pneumonia 5 years ago in same house. Rx in urgent care. Had high fever. Current started abruptly 2 weeks into ofev andhe wondering if due to ofev. Did not have classic Ofev sid effects. Mentioned to him Ofev reduces riks of flare.  - gardening: no longer gardening  - featherpillow - got rid of it even after last visit   -visible mold - denies but he discussed his crawl space. Says "there is no mold bu fungs on the top wall". 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 Biopsy indicated if stable but first rheum consult    04/26/2021- Interim hx  Patient presents today for ILD follow-up. He has been on OFEV for three weeks, currently taking 150mg  twice daily. He has been off oral prednisone for two weeks. Breathing is not significantly worse off steroids. Cough is the same. Since Saturday he developed chills, fatigue and low grade fever at night. Temp 100.1-100.3. He reprots some heavy breathing. Oxygen 92% or higher. Urinary frequency. Having some incontinence. Continues on Gabapentin for chronic cough. Referred to rheumatology, has an apt beginning of June. He has been avoid exposures mold/mildew, gardening.     05/02/2021 Follow up : ILD , Fever  Patient presents for an acute office visit.  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  Serial CT chest from August 2020 to August 2022 showed progressive changes.  Serology was negative except for a positive rheumatoid factor.  He has been referred to rheumatology with consult pending.  Pulmonary function testing showed significant restriction and decreased DLCO.  Patient started Ofev on March 01, 2021.  Patient was admitted early March with severe weakness dyspnea and activity intolerance.  He also had intermittent confusion hypoxemia and a fever Tmax of 102.  He was felt to have an ILD flare with possible  viral illness however COVID, influenza and viral panel were all negative.  He was treated with steroids.  CT chest showed fibrotic ILD stable since October 2022.  His Ofev was held during admission.  After discharge he said that his energy level was improved and had decreased oxygen demands.  Based on oxygen after discharge was oxygen 2 L with activity and at bedtime.  Not requiring oxygen at rest with O2 saturations greater than 90%.  Patient was restarted on Ofev April 05, 2020.  After 2 weeks of been on Ofev patient developed low-grade fever.  This is waxed and waned and yesterday developed a fever Tmax of 103.  Patient went to the emergency room work-up was unrevealing.  CT chest showed stable ILD with no acute process.  CBC was normal blood cultures are no  growth to date.  Respiratory viral panel was negative.  Lactic acid was 1.5 urinalysis was normal, urine culture is pending.  LFTs were slightly elevated.  Since last night patient says he is feeling better. Fever is resolved. Took Tylenol . O2 saturations are 100%.  Has some sore throat that started late night .  Had good appetite today . No fever today . No increased Oxygen demands.  Wife is [name].  Patient denies any rash, skin lesions, skin redness, joint swelling, insect or tick bite, travel, discolored mucus, abdominal pain, nausea vomiting or diarrhea. Has no previous joint replacement or surgeries. Abdominal hernia repair >64yrs ago. No abd pain,  Followed by [facility]ology at [facility].  2D echo January 25, 2018 3 aortic valve is thickened with mild to moderate aortic valve stenosis.,  EF 55 to 60%.  Right ventricle is normal systolic function is normal.     OV 05/19/2021  Subjective:  Patient ID: Dustin Dean, male , DOB: Sep 02, 1950 , age 71 y.o. , MRN: [id number] , ADDRESS: [place] [id number] PCP Joneen Boers, MD Patient Care Team: Joneen Boers, MD as PCP - General (Family Medicine)  This Provider for this visit:  Treatment Team:  Attending Provider: Brand Males, MD    05/19/2021 -   Chief Complaint  Patient presents with   Follow-up    PFT performed today.  Pt states since stopping the OFEV, he has not had anymore fever and states overall he has felt good.    Chronic cough on gabapentin  - well controlled  Interstitial lung disease work-up in progress -concern for hypersensitive pneumonitis  -Progressive phenotype between August 2020 and October 2022   - started ofev mid feb 2023 -> early march 2023 admitted for ILD flare ->   9 mm right lower lobe nodule seen in October 2022 for the first time: Stable January 2023  HPI Dustin Dean 72 y.o. -returns with wife [name].  He tells me that after last visit he did a rechallenge with nintedanib and he picked up fever and ended up in the emergency room very similar to March 2023 hospitalization.  Symptoms started 1-2 weeks into taking nintedanib.  His wife feels strongly this is nintedanib related.  Have not seen the patient gets significant side effects from nintedanib but have seen one of the patient gets something similar with pirfenidone.  That particular patient had even after 1 dose.  I think the strength of evidence here is that this systemic inflammatory response is probably related to nintedanib.  Therefore he has stopped it.  I have advised him against taking nintedanib ever again.  Clinically he is feeling stable.  His walking desaturation test is stable.  His pulmonary function test is also stable.  He uses 2 L with exertion but today in office when we walked him on room air he dropped 7 points and this is similar to before.  He wants to try pirfenidone.  We have discussed this in the past.  We also discussed prednisone but he does not want to do prednisone because of hyperglycemia.  We discussed the risk, benefits and limitations of pirfenidone.  Pirfenidone is not well studied in chronic HP.  Is only studied in 1 subtype of non-IPF  progressive phenotype.  Nevertheless is the only other antifibrotic option available.  He wants to try a donor sample.  We will give him 2 months worth.  He is traveling to the beach  once he comes back he will  take it    07/03/2021 Follow up : ILD  Patient returns for a 6-week follow-up.  Patient has underlying interstitial lung disease with progressive phenotype.  Serial CT chest from August 20 to August 2022 showed progressive changes.  Serology was negative except for positive rheumatoid factor.  Rheumatology consult is pending.  Previous PFTs have showed significant restriction and decreased diffusing capacity.  Patient was started on Ofev March 01, 2021.  Patient had a SIRS like response to OFEV x 2 .  Ofev has been stopped indefinitely and advised not to restart.  Patient was placed on a drug holiday.  Started on Esbriet 1 month ago.  Patient says since starting Esbriet he seems to be tolerating very well.  LFTs prior to start had returned back to normal.  Previously were elevated.  Patient says he has had minimal nausea.  No diarrhea no upset stomach and appetite seems to be normal.  Patient says he has been using sunscreen when he is out side.  Has had no rash.  We discussed returning for ongoing labs. Patient has been referred to pulmonary rehab but has not contacted today. Patient says overall he is doing okay.  Wears his oxygen with activity as needed.  And at bedtime.  Has had no increased oxygen demands. Patient says he still gets short of breath with heavy activities.  But feels like he is stable at this point.  Has had no return of fever.      OV 09/08/2021  Subjective:  Patient ID: Dustin Dean, male , DOB: Jun 05, 1950 , age 56 y.o. , MRN: [id number] , ADDRESS: [address] [place] [address] PCP Joneen Boers, MD Patient Care Team: Joneen Boers, MD as PCP - General (Family Medicine)  This Provider for this visit: Treatment Team:  Attending Provider: Brand Males, MD    09/08/2021 -   Chief Complaint  Patient presents with   Follow-up    Pt states he has been doing okay since last visit and denies any complaints.      HPI Chilton Barona 72 y.o. -returns for follow-up.  He tells me that he is lost more weight.  He is stable.  His symptom score shows good stability.  He is tolerating pirfenidone really well.  He has lost weight.  Current BMI is 31 but it is all intentional with the help of exercise, pirfenidone and also eating less calories.  No side effects from pirfenidone no sunburn nothing.  Is taking 3 to 2 pills 3 times daily.  He is willing to roll over to 1 big pill 3 times daily.  His main questions were -He wants to do some gardening such as cut wine but he assures me that will not be any exposure to organic dust.  -He wants to donate his body after death to medical school.  I advised him that either [facility] or [facility] is fine.  -He also has aortic stenosis.  He saw cardiology today at [facility].  He saw Dr. Loni Beckwith.  He is being referred to surgery at [place] for TAVR consideration.  According to the discussion [not clearly apparent in the chart review] TAVR is being recommended early because of his pulmonary fibrosis.  I did explain to him that in advance pulmonary fibrosis surgical risk is high but did want him to ask the surgeon if there is any downside to operating or placing TAVR procedure when the aortic stenosis is not critical.  PFT   OV 11/17/2021  Subjective:  Patient ID: Dustin Dean, male , DOB: 01-Feb-1950 , age 74 y.o. , MRN: [id number] , ADDRESS: [place] [address] PCP Joneen Boers, MD Patient Care Team: Joneen Boers, MD as PCP - General (Family Medicine)  This Provider for this visit: Treatment Team:  Attending Provider: Brand Males, MD    11/17/2021 -   Chief Complaint  Patient presents with   Follow-up    PFT performed today.  Pt  states he has multiple concerns to discuss.     HPI Roux Giesbrecht 72 y.o. -returns for follow-up.  He is doing well overall.  However for the last 3 days he has had a runny nose and with that he is having decreased energy.  He is got decreased social interest.  He also feels his memory is failing him more in the last 3 days he has noted some memory changes for the last several months but it is a lot worse in the last 3 days since the runny nose.  His COVID is negative but there is no fever brown sputum or yellow sputum.  There is no significant drainage.  There is no worsening shortness of breath there is no worsening cough there is no chills.  In addition he has new complaint of tender itchy scalp for the last 3 weeks there is no rash but he has an appointment with dermatologist because his scalp is dry and flaky.  He is not doing gardening but he does go out occasionally in the sun and he does not wear sunscreen or hat.  He is on pirfenidone which can cause skin rash and especially in the setting of sun exposure.  I did caution him this could be because of pirfenidone but he is going to see a dermatologist anyways.  Outside of this he is tolerating his pirfenidone well.  I did indicate to him he can roll himself into 1 big pill 3 times daily instead of 3 small pills 3 times daily for a total of 9 pills.  He also told me that he is at risk for depression.  He says in the past he has had significant depression he is on antidepressant.  He is worried this can come back with his ILD.  We talked about seeing Elias Else clinical psychologist who specializes in medical issues he is willing to see him.  Also discussed about doing light therapy  Preoperative evaluation: He has aortic stenosis.  He is saying now that it could be critical.  Apparently TAVR procedure is being planned date unknown.  Currently because of the cold he has suspended pulmonary rehabilitation he wants to know if he can go back.  I did  tell him we need to get clearance from the cardiologist because of critical aortic stenosis history I thought it was moderate in the past.          OV 02/26/2022  Subjective:  Patient ID: Dustin Dean, male , DOB: [birth year] , age 58 y.o. , MRN: [id number] , ADDRESS: [place] [address] PCP Joneen Boers, MD Patient Care Team: Joneen Boers, MD as PCP - General (Family Medicine)  This Provider for this visit: Treatment Team:  Attending Provider: Brand Males, MD    HPI Dustin Dean 72 y.o. -presents for follow-up.  Presents with his wife.  His wife is an independent historian today.  Since I last saw him in November 2023 several issues have happened.  1 in mid November 2023 had sinus congestion and he was given doxycycline and prednisone.  Then in mid December 2023 he had TAVR procedure for his aortic stenosis.  He says this went really well and his murmur has now resolved.  Then straight after Christmas 2023 he had COVID-19.  WE called in [place].  He is tolerating his pirfenidone at 801 mg pill 3 times daily without any problems.  He has lost some weight intentionally.  He does admit that after all this he is slightly more short of breath than before..  In fact when he walked him he started desaturating.  This is a new finding.  He had pulmonary function test today that shows significant decline in his DLCO.  This was done today and is captured below.  His most recent hemoglobin on 02/20/2022 was reviewed done at [facility].  Was 13.6 g% and normal.  We discussed the possible need of right heart catheterization.  When he was in the office he did not recall that he actually had a right heart catheterization.  After he left the office when I reviewed the records I noticed that in October 2023 he did have heart catheterization.  At that time he did have a right heart catheterization before the TAVR.  In this the pulmonary capillary wedge pressure was 11 mmHg,  pulmonary artery mean pressure was 27 mmHg and elevated, Fick cardiac index was 3.3 mL.  It was in this catheterization that they saw that he had severe aortic valve stenosis with aortic valve area of 1.05 cm and a valve gradient of 40 mmHg.  He did have follow-up echocardiogram on 02/08/2022.  His left ventricular ejection fraction is 55-60%.  Right ventricle is normal.  There is TAVR bioprosthetic valve.  No pericardial effusion.     Last Weight  Most recent update: 02/26/2022 10:35 AM    Weight  93.4 kg (205 lb 12.8 oz)              Modified Six Minute Walk - 02/26/22 1000     Type of O2 used  O2 3/L   POC pulsed oxygen   Number of laps completed  3    Lap Pace Moderate    Resting Heartrate 83 bpm    Final Heartrate 113 bpm    Resting Pulse Ox 94 %   room air. Patient feels he did not need his POC on at this time.   Desaturated to <= 3 points Yes    Desaturated to < 88% Yes    Distance walked when desaturation occurred  240 feet   patient completed one lap.   Became tachycardic Yes    Symptoms  SOB after one lap on room air.  Applied 3 lpm POC.  SaO2 back to 95%.  patient was able to complete remaining 2 laps - 3 laps total.    Was the O2 correction test done? Yes    Amount of O2 needed to correct destauration 3 L    Distance walked without destat below 88%  480 feet   patient walked remaining 2 laps on 3 lpm POC pulsed oxygen   comments patient desat after 1 lap to 88% room air.  POC oxygen applied  at 3 lpm pulsed.  patient completed remaining 2 laps SaO2 95%.            OV 04/08/2022  Subjective:  Patient ID: Dustin Dean, male , DOB: [birth year] , age 36 y.o. , MRN: [id number] ,  ADDRESS: [address] [place] [address] PCP [name], MD Patient Care Team: [name], MD as PCP - General (Family Medicine)  This Provider for this visit: Treatment Team:  Attending Provider: Kalman Shan, MD   02/26/2022 -   Chief Complaint  Patient presents  with   Follow-up    COVID end of December 2023.  Aortic valve replacement (TAVR)  01/02/22.  Cough persistent.  Runny nose improved after seeing ENT.    Latest Reference Range & Units 02/17/16 19:20 04/26/21 15:31 05/01/21 19:55 12/20/21 12:34 01/09/22 23:37  Eosinophils Absolute 0.0 - 0.5 K/uL 0.4 0.3 0.2 0.6 0.4    Anxiety and depression  -Seeing Jeris Penta psychology  04/08/2022 -   Chief Complaint  Patient presents with   Follow-up    Pt is here for follow up. Pt states no adverse effects noted so far for the Esbriet.      HPI Yerachmiel Prizzi 72 y.o. - returns for followup. Still on esbriet. Symotoms stable. Was suppsoed to have had RHC but on day of procedure DrSam Turner had multiple other patient emergencies. So it is rescheduled. Date pending.  Here to review PFT and CT Due to concern for progression. CT is definitel progressed from a year ago - despite esbriet.  PFT worse since a year ago but since covidd ? Rebounding back. We discuseed disease progression . Eplained next step is to try immune modulator. Altneratively await [facility] and see if he has WHO-3 Pulm htn and if so commit to tyvaso (interestingly this is a hypothesisas primary Rx in trials [place]). We took shared decision to awaith RHC. Reommended he hold off clinical trial at this point. Cough still + but says is better with gabapentin. He does have high eos and we will discuss this next visit.  Obesity: losing weight  Wife also present and was independent historian     OV 05/22/2022  Subjective:  Patient ID: Dustin Dean, male , DOB: 03-27-1950 , age 72 y.o. , MRN: [id number] , ADDRESS: [address] [place] [address] PCP [name], MD Patient Care Team: [name], MD as PCP - General (Family Medicine)  This Provider for this visit: Treatment Team:  Attending Provider: Kalman Shan, MD   Chronic cough on gabapentin  - well controlled  Interstitial lung disease high prob for  hypersensitive pneumonitis  -Progressive phenotype between August 2020 and October 2022   - started ofev mid feb 2023 -> early march 2023 admitted for ILD flare -> rechallenged ofev April 2023 -> ER vist SIRS   - SAE with OFev/ Ofev stopped  - started Pirfenioned May 2023 (rjected prednisone due to side effec tprofile)  - transplaint discussion marrch 2-24 - not interested  9 mm right lower lobe nodule seen in October 2022 for the first time: Stable January 2023 repoted as fibrosis ->Not reported n feb 2024   8 mm lateral subpleural left lower lobe nodule (5/93),-> unchanged from 08/30/2017.-> through feb 2024  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  Respiratory exacerbations/infections  -Mid November 2023: Sinus congestion and given doxycycline and prednisone  -01/09/2022: COVID-19 and given molnupiravir E  RAST allergy panel December 2023 -Mildly positive for cat and dog dander.  Otherwise negative.  Normal IgE'. Luis Abed Eos -   05/22/2022 -   Chief Complaint  Patient presents with   Follow-up    F/up on PFT     HPI Dustin Dean 72 y.o. -returns for follow-up for his above issues.  Since his last visit he had his right heart catheterization.  It confirmed pulmonary hypertension present even after his TAVR procedure.  We ordered treprostinil.  He got the package last week and is going to start it tonight after the [place] therapeutics agent visits with him and sets up the package for him.  He is looking forward to starting it.  Currently tolerating pirfenidone well.  He is going through cardiac rehab.  He states his stamina is improved his weight has come down.  His shortness of breath is better.  All reflected below.  Even the cough is better on Neurontin.  He is now seeing counselor  Serafina Mitchell and is feeling better.  He had pulmonary function test and it shows continued recent stability.  This is also described below.  He continues to use portable oxygen.  Recently went to [facility] and did some unloading and he did not need oxygen.  Today made him do a sit/stand test x 10 times.  He did not desaturate below 93%.     SYMPTOM SCALE - ILD 01/24/2021 02/23/2021   04/05/2021 Post hopsilaization. On pred taper and off ofev Ofev 150 twice daily; No prednsone  05/19/2021 214# 11/17/2021  02/26/2022 205# 04/08/2022 199# 05/22/2022 199# - cardica reab  Current weight         esbiret esbriet esbiret Esbietr - going to start tyvaso tonight  O2 use 2L [place] with ex 2L with ex 2L Wth ex at hoe 2L       Shortness of Breath 0 -> 5 scale with 5 being worst (score 6 If unable to do)            At rest 0 0 0 1 0 0 0 0 0  Simple tasks - showers, clothes change, eating, shaving 1 1 0.5 2 1 1  0 0 0  Household (dishes, doing bed, laundry) 1 1 1 2 2 1 1 1 1   Shopping 2 1 0 NA 1 1 1 1 1   Walking level at own pace 5 2 0 1 (with oxygen) 1 2 2 1  0  Walking up Stairs 5 3 1  with o2, 6 without o2 2 (with oxygen) 2 3 3 2 2   Total (30-36) Dyspnea Score 14 8   8 7 8 7 5  with o2 4 with o2  How bad is your cough? 2 3 0 with gbapentin 1-2 (on gabapentin) 2 3 2 3 1   How bad is your fatigue 1 2 0 4 1 4  0 0 0  How bad is nausea 00000 0 0 0 0 0 0 0 0  How bad is vomiting?   0 0 0 0 0 0 0 0 0  How bad is diarrhea? 0 0 0 0 0 0 0 0 0  How bad is anxiety? 0 0 0 0 0 2 0 1 3  How bad is depression 0 0 0 0 (on antidepressant) 1 2 0 1 3  Any chronic pain - if so where and how bad 0 0 0 0 0  1 x x     Simple office walk 185 feet x  3 laps goal with forehead probe 01/24/2021  04/05/2021 On prendisone. Off ofev 05/19/2021 Off  ofev 09/08/2021 On esbiret since may 2023 02/26/2022 Esbiret since may 2023 04/06/22 05/22/2022 Full dose Esbriet.  Going to start treprostinil  O2 used Ra at rest, desat at 1 lap and then walked  with 2L piulse Ra - being on room air for 15 min and in mddile of pred burst taper   ra ra Room air  Number laps completed 1 lap on RA, then 3 laps on 2L Did all 3 laps   1 of 3 laps - forehead probe 3 of 3 laps Sit/stand x 10 times  Comments about pace x avg  avg av    Resting Pulse Ox/HR 96% and 88/min at 1 lap 98% an Hr 92 98% AND HR 81 98% and HR 77 94% 94% and HR 82 98 send and heart rate 81  Final Pulse Ox/HR 92% and 115/min on 3 laps on 2L pulsed 92% and HR 117 91% and HR 115 92% and HR 113 88% 88% and HR 109 93% and heart rate 102  Desaturated </= 88% no no no no     Desaturated <= 3% points yes Yes, 6  Yes 7  Yes 6 pots     Got Tachycardic >/= 90/min yes yes yes avg     Symptoms at end of test Modreate dyspnea Modate dyspnea Mild dyspnea Mild dyspnea   Level 2 out of 10 dyspnea  Miscellaneous comments Corrected with 2L [place] Did not need o2, improved           PFT     Latest Ref Rng & Units 05/22/2022    9:53 AM 04/04/2022    8:31 AM 02/26/2022    9:56 AM 11/17/2021    1:02 PM 05/19/2021   10:56 AM 02/21/2021    4:04 PM  PFT Results  FVC-Pre L 2.02  P 1.98  2.02  1.94  1.95  1.82   FVC-Predicted Pre % 48  P 51  52  46  46  43   FVC-Post L  2.18     1.95   FVC-Predicted Post %  57     46   Pre FEV1/FVC % % 90  P 89  88  88  90  88   Post FEV1/FCV % %  93     91   FEV1-Pre L 1.81  P 1.76  1.78  1.71  1.75  1.60   FEV1-Predicted Pre % 59  P 62  63  56  56  51   FEV1-Post L  2.03     1.76   DLCO uncorrected ml/min/mmHg 14.22  P 16.38  12.33  16.63  19.56  15.57   DLCO UNC% % 57  P 71  53  67  79  63   DLCO corrected ml/min/mmHg 14.38  P 16.38  12.62  16.87  19.56  15.57   DLCO COR %Predicted % 58  P 71  54  68  79  63   DLVA Predicted % 103  P 111  98  106  116  106   TLC L  3.57     3.76   TLC % Predicted %  57     56   RV % Predicted %  78     75     P Preliminary result       has a past medical history of Aortic stenosis, Flu, Pneumonia, Prostate cancer (HCC), Pulmonary  fibrosis (HCC), and Skin cancer.   reports that he has never  smoked. He has been exposed to tobacco smoke. He has never used smokeless tobacco.  Past Surgical History:  Procedure Laterality Date   HERNIA REPAIR     NASAL SEPTUM SURGERY     SKIN CANCER EXCISION      Allergies  Allergen Reactions   Bee Venom Rash   Shellfish Allergy Hives   Nintedanib Other (See Comments)    SIRS, fevers   Pseudoephedrine Anxiety    Immunization History  Administered Date(s) Administered   Fluad Quad(high Dose 65+) 10/23/2017, 09/30/2019, 10/14/2020, 10/18/2020   Influenza-Unspecified 11/11/2009, 11/08/2011, 11/03/2013, 11/22/2014, 11/10/2015   Moderna Covid-19 Vaccine Bivalent Booster 46yrs & up 10/24/2020   Moderna Sars-Covid-2 Vaccination 03/19/2019, 04/16/2019, 11/16/2019, 05/07/2020   Pneumococcal Conjugate-13 12/08/2007, 12/21/2015, 09/04/2018   Pneumococcal Polysaccharide-23 12/08/2007, 06/21/2017, 06/16/2018   Tdap 12/08/2007, 11/27/2018   Zoster Recombinat (Shingrix) 10/30/2013, 09/04/2018, 11/27/2018   Zoster, Live 10/30/2013   Zoster, Unspecified 10/30/2013    Family History  Problem Relation Age of Onset   Cancer Mother    Congestive Heart Failure Father    Prostate cancer Father    Healthy Sister      Current Outpatient Medications:    acetaminophen (TYLENOL) 325 MG tablet, Take by mouth as needed., Disp: , Rfl:    aspirin-acetaminophen-caffeine (EXCEDRIN MIGRAINE) 250-250-65 MG tablet, Take by mouth as needed., Disp: , Rfl:    Calcium Carb-Cholecalciferol (CALCIUM/VITAMIN D PO), Take 1 tablet by mouth daily., Disp: , Rfl:    Cetirizine HCl (ZYRTEC PO), Take by mouth., Disp: , Rfl:    ESBRIET 801 MG TABS, Take 1 tablet (801 mg total) by mouth 3 (three) times daily with meals., Disp: 270 tablet, Rfl: 1   fluorouracil (EFUDEX) 5 % cream, Apply topically., Disp: , Rfl:    fluticasone (FLONASE) 50 MCG/ACT nasal spray, Place into both nostrils daily., Disp: , Rfl:     gabapentin (NEURONTIN) 300 MG capsule, Take 300 mg by mouth 3 (three) times daily., Disp: , Rfl:    ipratropium (ATROVENT) 0.06 % nasal spray, Place 2 sprays into both nostrils 3 (three) times daily., Disp: , Rfl:    loperamide (IMODIUM) 2 MG capsule, Take 1 mg by mouth once., Disp: , Rfl:    meclizine (ANTIVERT) 25 MG tablet, Take 25 mg by mouth 2 (two) times daily as needed., Disp: , Rfl:    Multiple Vitamin (MULTIVITAMIN) capsule, Take 1 capsule by mouth every morning., Disp: , Rfl:    naproxen sodium (ALEVE) 220 MG tablet, Take 220 mg by mouth daily as needed., Disp: , Rfl:    omeprazole (PRILOSEC) 40 MG capsule, Take 40 mg by mouth daily., Disp: , Rfl:    PAROXETINE HCL PO, Take by mouth., Disp: , Rfl:    Pirfenidone 267 MG TABS, Take 267 mg by mouth in the morning, at noon, and at bedtime., Disp: , Rfl:    rosuvastatin (CRESTOR) 5 MG tablet, Take 1 tablet by mouth daily., Disp: , Rfl:    SUMAtriptan (IMITREX) 100 MG tablet, TAKE ONE TABLET BY MOUTH AT ONSET OF HEADACHE; MAY REPEAT ONE TABLET IN 2 HOURS IF NEEDED. MAX OF 200 MG PER DAY, Disp: , Rfl:       Objective:   Vitals:   05/22/22 1054  BP: 124/71  Pulse: 71  SpO2: 95%  Weight: 199 lb (90.3 kg)  Height: 5\' 8"  (1.727 m)    Estimated body mass index is 30.26 kg/m as calculated from the following:   Height as of this encounter: 5\' 8"  (1.727  m).   Weight as of this encounter: 199 lb (90.3 kg).  @WEIGHTCHANGE @  [employer]   05/22/22 1054  Weight: 199 lb (90.3 kg)     Physical Exam General: No distress. Looks well Neuro: Alert and Oriented x 3. GCS 15. Speech normal Psych: Pleasant Resp:  Barrel Chest - no.  Wheeze - no, Crackles - yes at base and UL, No overt respiratory distress CVS: Normal heart sounds. Murmurs - no Ext: Stigmata of Connective Tissue Disease - no HEENT: Normal upper airway. PEERL +. No post nasal drip        Assessment:       ICD-10-CM   1. ILD (interstitial lung disease) (HCC)  J84.9  Pulmonary function test    2. Rheumatoid factor positive  R76.8     3. 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 Pulmonary air trapping Chronic respiratory failure with hypoxia (HCC) Rheumatoid factor positive Encounter for therapeutic drug monitoring  - -  CT Spring 2024 worse fibrosis x 1 year despite pirfenidone -> PFT stable FEb 2024> May 2024 -Glad you are tolerating pirfenidone really well    Plan (shared decision making) --Continue pirfenidone 801mg  x 3 times daily  - take with food  - space 5-6 hours apart  - apply sunscreen  - wear hat  - hydrate well -Holding off on rheumatology evaluation for now -Okay to do judicious gardening as long as there is no exposure to organic dust andwear N95 mask  = keep pulse ox > 88% -12 weeks do sprioemtry and dlco - any progression discuss research or cellcept  infuture  Chronic cough  Noticed that gabapentin is really helping you  Plan - Okay to continue gabapentin - can discuss anti-eos at some point in fture    -Obesity  -losing weight  Plan - continu weight loss - Please talk to primary care physician about weight loss drugs -   Follow-up - 4 weeks  - NP viideo visit to discuss tyyvaso uptake  - 12  weeks 30-minute visit with Dr. Marchelle Gearing but after spirometry and DLCO.    SIGNATURE    Dr. Kalman Shan, M.D., F.C.C.P,  Pulmonary and Critical Care Medicine Staff Physician, [facility] Center Director - Interstitial Lung Disease  Program  Pulmonary Fibrosis [facility] [place], [place], [address]  Pager: [phone], If no answer or between  15:00h - 7:00h: call [phone] Telephone: [phone]  11:42 AM 05/22/2022

## 2022-05-22 NOTE — Patient Instructions (Addendum)
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 Pulmonary air trapping Chronic respiratory failure with hypoxia (HCC) Rheumatoid factor positive Encounter for therapeutic drug monitoring  - -  CT Spring 2024 worse fibrosis x 1 year despite pirfenidone -> PFT stable FEb 2024> May 2024 -Glad you are tolerating pirfenidone really well    Plan (shared decision making) --Continue pirfenidone 801mg  x 3 times daily  - take with food  - space 5-6 hours apart  - apply sunscreen  - wear hat  - hydrate well -Holding off on rheumatology evaluation for now -Okay to do judicious gardening as long as there is no exposure to organic dust andwear N95 mask  = keep pulse ox > 88% -12 weeks do sprioemtry and dlco - any progression discuss research or cellcept  infuture  Chronic cough  Noticed that gabapentin is really helping you  Plan - Okay to continue gabapentin - can discuss anti-eos at some point in fture    -Obesity  -losing weight  Plan - continu weight loss - Please talk to primary care physician about weight loss drugs -   Follow-up - 4 weeks  - NP viideo visit to discuss tyyvaso uptake  - 12  weeks 30-minute visit with Dr. Marchelle Gearing but after spirometry and DLCO.

## 2022-05-22 NOTE — Patient Instructions (Signed)
Spiro/DLCO performed today.  

## 2022-05-24 NOTE — Telephone Encounter (Signed)
Emails received from Barrie Folk, RN ([facility] nurse). Patient started treatment on 05/22/22: Dustin Dean experienced some chest tightness within the first 30 min after his first dose of 3 breaths which resolved within the hour. His BP was stable (139/86 prior, 113/64 15 min post, 126/75 30 min post, 123/81 45 min post, 130/83 1 hour post). No other immediate side effects noted.  Dustin Dean and his wife were present for teaching and completed preparing and administering the first dose without issue.   Follow up visit scheduled for Wednesday 5/15 at 1600.   Subsequent email from 05/23/22: Talked to Dustin Dean today after 4th dose: pt is "doing well". Experienced some chest tightness and slight headache after morning dose. BP 124/81 15 min after 1600 dose.   Routing to Dr. Marchelle Gearing as Lorain Childes. F/u appt with [name] Parrett is scheduled for 06/20/22  Chesley Mires, PharmD, MPH, BCPS, CPP Clinical Pharmacist (Rheumatology and Pulmonology)

## 2022-05-28 ENCOUNTER — Encounter: Payer: Self-pay | Admitting: Internal Medicine

## 2022-05-28 DIAGNOSIS — J849 Interstitial pulmonary disease, unspecified: Secondary | ICD-10-CM

## 2022-05-30 ENCOUNTER — Telehealth: Payer: Self-pay

## 2022-05-30 NOTE — Telephone Encounter (Signed)
Received fax from [facility] stating that they have been unable to reach pt to discuss ongoing eligibility for PAP. Reached out to pt to discuss, per pt he has not received any calls from [place]. He requested I send him [employer]'s phone number via MyChart so that he can f/u.

## 2022-05-31 NOTE — Telephone Encounter (Signed)
Received fax from [facility] stating patient is approved to continue to receive Esbriet for free through [employer]'s patient assistance program through 01/15/2024. No further action from patient or provider is needed at this time.  Chesley Mires, PharmD, MPH, BCPS, CPP Clinical Pharmacist (Rheumatology and Pulmonology)

## 2022-06-01 NOTE — Telephone Encounter (Signed)
Yes it is okay to provide oxygen for his fitness exercise.  Okay to use the oxygen from the fitness center.  Baseline he needs 2 L with simple walking.  However with exercise he might need more.  So his pulse ox needs to be monitored and oxygen adjusted so that it stays over 86% at all times.

## 2022-06-01 NOTE — Telephone Encounter (Signed)
Rx printed and signed and placed in Dr. Jane Canary box until pt notifies [place] of preferred pick up method.

## 2022-06-05 ENCOUNTER — Encounter: Payer: Self-pay | Admitting: Behavioral Health

## 2022-06-05 ENCOUNTER — Ambulatory Visit (INDEPENDENT_AMBULATORY_CARE_PROVIDER_SITE_OTHER): Payer: Medicare Other | Admitting: Behavioral Health

## 2022-06-05 DIAGNOSIS — R454 Irritability and anger: Secondary | ICD-10-CM

## 2022-06-05 DIAGNOSIS — F331 Major depressive disorder, recurrent, moderate: Secondary | ICD-10-CM | POA: Diagnosis not present

## 2022-06-05 NOTE — Progress Notes (Signed)
Behavioral Health Counselor Initial Adult Exam  Name: Dustin Dean Date: 06/05/2022 MRN: [id number] DOB: 21-Jun-1950 PCP: [name], MD  Time spent: 58 minutes  Guardian/Payee: Self  Paperwork requested: Yes   Reason for Visit /Presenting Problem: Depression For the most part the patient says the last couple of weeks have been okay.  He is planning a trip in August with some of his friends and neighbors to the mountains in [place].  Tentatively he is going to [facility] in June to visit his sister.  He probably will stay at his neighbor's condo and then visit for the afternoon because of his history with his nephew.  We spent time looking at his history with his nephew and when the negative aspect of that started.  He describes his nephew as being very arrogant and speaking in very clipped straightforward manner which is irritating.  There have been a couple of situations in which there were differences of opinion and the patient said that in retrospect he wishes he had handled it differently but still knows that type of personality is very frustrating for him.  His sister acknowledged recently that she knows that he does not like his nephew and that was somewhat of a relief to him.  He wants and has a good relationship with his sister and does not see his nephew very often.  His other nephew has a completely different personality and the patient has a good relationship with him.  Having some health issues he says has put things into perspective and he does not want to feel irritable or belligerent and is doing a better job of catching himself before he responds.  We talked about the importance of intentional mindfulness and being aware of what he is thinking about and how that impacts emotional reaction and choices that he makes.  I gave an example of intentional mindfulness but also gave him some homework of listening to the Beatles twist and shout and some homework to do  associated with mindfulness and that song.  He does contract for safety having no thoughts of hurting himself or anyone else. Mental Status Exam: Appearance:   Fairly Groomed     Behavior:  Appropriate  Motor:  Normal  Speech/Language:   Clear and Coherent and Normal Rate  Affect:  Appropriate  Mood:  normal  Thought process:  normal  Thought content:    WNL  Sensory/Perceptual disturbances:    WNL  Orientation:  oriented to person, place, time/date, situation, and day of week  Attention:  Good  Concentration:  Good  Memory:  WNL  Fund of knowledge:   Good  Insight:    Good  Judgment:   Good  Impulse Control:  Good     Reported Symptoms: Depression  Risk Assessment: Danger to Self:  No Self-injurious Behavior: No Danger to Others: No Duty to Warn:no Physical Aggression / Violence:No  Access to Firearms a concern: No  Gang Involvement:No  Patient / guardian was educated about steps to take if suicide or homicide risk level increases between visits: n/a While future psychiatric events cannot be accurately predicted, the patient does not currently require acute inpatient psychiatric care and does not currently meet [facility] involuntary commitment criteria.  Substance Abuse History: Current substance abuse: No     Past Psychiatric History:   Previous psychological history is significant for depression Outpatient Providers: Primary care physician History of Psych Hospitalization:  None reported Psychological Testing:  n/a    Abuse  History:  Victim of: No.,  None reported    Report needed: No. Victim of Neglect:No. Perpetrator of n/a Witness / Exposure to Domestic Violence: None reported  Protective Services Involvement: No  Witness to [employer] Violence:  No   Family History:  Family History  Problem Relation Age of Onset   Cancer Mother    Congestive Heart Failure Father    Prostate cancer Father    Healthy Sister     Living situation: the patient  lives with their spouse  Sexual Orientation: Straight  Relationship Status: married  Name of spouse / other: Did not discuss If a parent, number of children / ages: Adults  Support Systems: spouse  Financial Stress:  No   Income/Employment/Disability: Doctor, general practice: No   Educational History: Education: high school diploma/GED  Religion/Sprituality/World View: Did not discuss  Any cultural differences that may affect / interfere with treatment:  not applicable   Recreation/Hobbies: Time with family  Stressors: Health problems    Strengths: Supportive Relationships, Hopefulness, Self Advocate, and Able to Communicate Effectively  Barriers:     Legal History: Pending legal issue / charges: The patient has no significant history of legal issues. History of legal issue / charges:  Not applicable  Medical History/Surgical History: reviewed Past Medical History:  Diagnosis Date   Aortic stenosis    Flu    Pneumonia    Prostate cancer (HCC)    Pulmonary fibrosis (HCC)    Skin cancer     Past Surgical History:  Procedure Laterality Date   HERNIA REPAIR     NASAL SEPTUM SURGERY     SKIN CANCER EXCISION      Medications: Current Outpatient Medications  Medication Sig Dispense Refill   acetaminophen (TYLENOL) 325 MG tablet Take by mouth as needed.     aspirin-acetaminophen-caffeine (EXCEDRIN MIGRAINE) 250-250-65 MG tablet Take by mouth as needed.     Calcium Carb-Cholecalciferol (CALCIUM/VITAMIN D PO) Take 1 tablet by mouth daily.     Cetirizine HCl (ZYRTEC PO) Take by mouth.     ESBRIET 801 MG TABS Take 1 tablet (801 mg total) by mouth 3 (three) times daily with meals. 270 tablet 1   fluorouracil (EFUDEX) 5 % cream Apply topically.     fluticasone (FLONASE) 50 MCG/ACT nasal spray Place into both nostrils daily.     gabapentin (NEURONTIN) 300 MG capsule Take 300 mg by mouth 3 (three) times daily.     ipratropium (ATROVENT) 0.06 %  nasal spray Place 2 sprays into both nostrils 3 (three) times daily.     loperamide (IMODIUM) 2 MG capsule Take 1 mg by mouth once.     meclizine (ANTIVERT) 25 MG tablet Take 25 mg by mouth 2 (two) times daily as needed.     Multiple Vitamin (MULTIVITAMIN) capsule Take 1 capsule by mouth every morning.     naproxen sodium (ALEVE) 220 MG tablet Take 220 mg by mouth daily as needed.     omeprazole (PRILOSEC) 40 MG capsule Take 40 mg by mouth daily.     PAROXETINE HCL PO Take by mouth.     rosuvastatin (CRESTOR) 5 MG tablet Take 1 tablet by mouth daily.     SUMAtriptan (IMITREX) 100 MG tablet TAKE ONE TABLET BY MOUTH AT ONSET OF HEADACHE; MAY REPEAT ONE TABLET IN 2 HOURS IF NEEDED. MAX OF 200 MG PER DAY     Treprostinil (TYVASO) 0.6 MG/ML SOLN Inhale into the lungs 4 (four) times daily.  No current facility-administered medications for this visit.    Allergies  Allergen Reactions   Bee Venom Rash   Shellfish Allergy Hives   Nintedanib Other (See Comments)    SIRS, fevers   Pseudoephedrine Anxiety    Diagnoses:  Major depressive disorder, recurrent, moderate, irritability Plan of Care: I will meet with the patient every 2 to 3 weeks in person   French Ana, [employer] [facility]                  French Ana, [facility]

## 2022-06-07 NOTE — Telephone Encounter (Signed)
Placed in outgoing mail. Nothing further needed.

## 2022-06-19 ENCOUNTER — Telehealth (INDEPENDENT_AMBULATORY_CARE_PROVIDER_SITE_OTHER): Payer: Medicare Other | Admitting: Adult Health

## 2022-06-19 ENCOUNTER — Ambulatory Visit (INDEPENDENT_AMBULATORY_CARE_PROVIDER_SITE_OTHER): Payer: Medicare Other | Admitting: Behavioral Health

## 2022-06-19 ENCOUNTER — Encounter: Payer: Self-pay | Admitting: Adult Health

## 2022-06-19 ENCOUNTER — Encounter: Payer: Self-pay | Admitting: Behavioral Health

## 2022-06-19 DIAGNOSIS — I272 Pulmonary hypertension, unspecified: Secondary | ICD-10-CM | POA: Diagnosis not present

## 2022-06-19 DIAGNOSIS — J849 Interstitial pulmonary disease, unspecified: Secondary | ICD-10-CM | POA: Diagnosis not present

## 2022-06-19 DIAGNOSIS — R454 Irritability and anger: Secondary | ICD-10-CM | POA: Diagnosis not present

## 2022-06-19 DIAGNOSIS — R053 Chronic cough: Secondary | ICD-10-CM | POA: Diagnosis not present

## 2022-06-19 DIAGNOSIS — J9611 Chronic respiratory failure with hypoxia: Secondary | ICD-10-CM

## 2022-06-19 DIAGNOSIS — F411 Generalized anxiety disorder: Secondary | ICD-10-CM

## 2022-06-19 NOTE — Progress Notes (Unsigned)
Error

## 2022-06-19 NOTE — Patient Instructions (Addendum)
Continue on Oxygen 2l/m with activity and At bedtime  . Goal is for O2 sats >88-90%.  Activity as tolerated.  Delsym 2 tsp Twice daily  for cough as needed  Sips of water to help cough  Continue on Esbriet Three times a day.  Continue with exercise plan.  Continue on Gabapentin Three times a day.  Follow up 6-8 weeks with Dr. Marchelle Gearing after PFT  Please contact office for sooner follow up if symptoms do not improve or worsen or seek emergency care

## 2022-06-19 NOTE — Progress Notes (Signed)
[facility] Counselor Initial Adult Exam  Name: Dustin Dean Date: 06/19/2022 MRN: [id number] DOB: 1950/11/26 PCP: [name], MD  Time spent: 50 minutes spent face to face with the patient  Guardian/Payee: Self  Paperwork requested: Yes   Reason for Visit /Presenting Problem: Depression The patient and his wife went to [place] over the weekend for a wedding with family members of his wife that they really enjoyed being around.  He said it was a very enjoyable weekend but started off with his situation which escalated and both he and his wife felt badly about.  There was a plan in place to do something and he gave it some thought it and came up with another solution and is trying to tell his wife about that she cut him off and escalated from there.  He does not like to be interrupted so he knows that triggered him but also does not like the way he responded.  We looked at the thoughts and feelings behind how the situation was handled both by he and his wife.  He says she apologized somewhat but that is pretty typical the way that she responded and he has not apologize yet but wants to.  We talked about how he does that in a way that does not apologize for wanting to try something different but for the way that he responded.  He did try the mindfulness exercise with the Beatles twist and shout that we talked about saying he realized it was difficult and he has looked up other mindfulness exercises to practice.  He said he recognizes especially with an argument with his wife the importance of being mindful in those situations that he knows can be triggering for his anger and irritability.  He does contract for safety having no thoughts of hurting himself or anyone else. Mental Status Exam: Appearance:   Fairly Groomed     Behavior:  Appropriate  Motor:  Normal  Speech/Language:   Clear and Coherent and Normal Rate  Affect:  Appropriate  Mood:  normal  Thought process:  normal   Thought content:    WNL  Sensory/Perceptual disturbances:    WNL  Orientation:  oriented to person, place, time/date, situation, and day of week  Attention:  Good  Concentration:  Good  Memory:  WNL  Fund of knowledge:   Good  Insight:    Good  Judgment:   Good  Impulse Control:  Good     Reported Symptoms: Depression  Risk Assessment: Danger to Self:  No Self-injurious Behavior: No Danger to Others: No Duty to Warn:no Physical Aggression / Violence:No  Access to Firearms a concern: No  Gang Involvement:No  Patient / guardian was educated about steps to take if suicide or homicide risk level increases between visits: n/a While future psychiatric events cannot be accurately predicted, the patient does not currently require acute inpatient psychiatric care and does not currently meet [facility] involuntary commitment criteria.  Substance Abuse History: Current substance abuse: No     Past Psychiatric History:   Previous psychological history is significant for depression Outpatient Providers: Primary care physician History of Psych Hospitalization:  None reported Psychological Testing:  n/a    Abuse History:  Victim of: No.,  None reported    Report needed: No. Victim of Neglect:No. Perpetrator of n/a Witness / Exposure to Domestic Violence: None reported  Protective Services Involvement: No  Witness to [employer] Violence:  No   Family History:  Family History  Problem  Relation Age of Onset   Cancer Mother    Congestive Heart Failure Father    Prostate cancer Father    Healthy Sister     Living situation: the patient lives with their spouse  Sexual Orientation: Straight  Relationship Status: married  Name of spouse / other: Did not discuss If a parent, number of children / ages: Adults  Support Systems: spouse  Financial Stress:  No   Income/Employment/Disability: Doctor, general practice: No   Educational  History: Education: high school diploma/GED  Religion/Sprituality/World View: Did not discuss  Any cultural differences that may affect / interfere with treatment:  not applicable   Recreation/Hobbies: Time with family  Stressors: Health problems    Strengths: Supportive Relationships, Hopefulness, Self Advocate, and Able to Communicate Effectively  Barriers:     Legal History: Pending legal issue / charges: The patient has no significant history of legal issues. History of legal issue / charges:  Not applicable  Medical History/Surgical History: reviewed Past Medical History:  Diagnosis Date   Aortic stenosis    Flu    Pneumonia    Prostate cancer (HCC)    Pulmonary fibrosis (HCC)    Skin cancer     Past Surgical History:  Procedure Laterality Date   HERNIA REPAIR     NASAL SEPTUM SURGERY     SKIN CANCER EXCISION      Medications: Current Outpatient Medications  Medication Sig Dispense Refill   acetaminophen (TYLENOL) 325 MG tablet Take by mouth as needed.     aspirin-acetaminophen-caffeine (EXCEDRIN MIGRAINE) 250-250-65 MG tablet Take by mouth as needed.     Calcium Carb-Cholecalciferol (CALCIUM/VITAMIN D PO) Take 1 tablet by mouth daily.     Cetirizine HCl (ZYRTEC PO) Take by mouth.     ESBRIET 801 MG TABS Take 1 tablet (801 mg total) by mouth 3 (three) times daily with meals. 270 tablet 1   fluorouracil (EFUDEX) 5 % cream Apply topically.     fluticasone (FLONASE) 50 MCG/ACT nasal spray Place into both nostrils daily.     gabapentin (NEURONTIN) 300 MG capsule Take 300 mg by mouth 3 (three) times daily.     ipratropium (ATROVENT) 0.06 % nasal spray Place 2 sprays into both nostrils 3 (three) times daily.     loperamide (IMODIUM) 2 MG capsule Take 1 mg by mouth once.     meclizine (ANTIVERT) 25 MG tablet Take 25 mg by mouth 2 (two) times daily as needed.     Multiple Vitamin (MULTIVITAMIN) capsule Take 1 capsule by mouth every morning.     naproxen sodium  (ALEVE) 220 MG tablet Take 220 mg by mouth daily as needed.     omeprazole (PRILOSEC) 40 MG capsule Take 40 mg by mouth daily.     PAROXETINE HCL PO Take by mouth.     rosuvastatin (CRESTOR) 5 MG tablet Take 1 tablet by mouth daily.     SUMAtriptan (IMITREX) 100 MG tablet TAKE ONE TABLET BY MOUTH AT ONSET OF HEADACHE; MAY REPEAT ONE TABLET IN 2 HOURS IF NEEDED. MAX OF 200 MG PER DAY     Treprostinil (TYVASO) 0.6 MG/ML SOLN Inhale into the lungs 4 (four) times daily.     No current facility-administered medications for this visit.    Allergies  Allergen Reactions   Bee Venom Rash   Shellfish Allergy Hives   Nintedanib Other (See Comments)    SIRS, fevers   Pseudoephedrine Anxiety    Diagnoses:  Major depressive disorder,  recurrent, moderate, irritability Plan of Care: I will meet with the patient every 2 to 3 weeks in person   French Ana, [facility]                  French Ana, [facility]               French Ana, [facility]

## 2022-06-21 NOTE — Progress Notes (Signed)
Virtual Visit via Video Note  I connected with Dustin Dean on 06/19/22 at  2:00 PM EDT by a video enabled telemedicine application and verified that I am speaking with the correct person using two identifiers.  Location: Patient: Home  Provider: Office    I discussed the limitations of evaluation and management by telemedicine and the availability of in person appointments. The patient expressed understanding and agreed to proceed.  History of Present Illness: 72 year old male followed for interstitial lung disease with progressive phenotype (diagnosed in 2019) Seen for pulmonary consult to establish for ILD in January 2023.  Has chronic respiratory failure on oxygen History of prostate cancer.  History of aortic valve stenosis followed by cardiology at [facility] video visit is a 1 month follow-up for pulmonary hypertension related ILD patient says since last visit he is doing better.  Has completed pulmonary rehab.  And now is beginning a new exercise class at local gym.  Patient remains on Esbriet.  He says he has no issues.  Appetite is good with no nausea vomiting or diarrhea.  He was recently started on Tyvaso. Patient is doing well. Completing pulmonary rehab.  Going to a new exercise class Tolerating Tyvaso well.  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  Remains on oxygen 2 L with activity and at bedtime.  Past Medical History:  Diagnosis Date   Aortic stenosis    Flu    Pneumonia    Prostate cancer (HCC)    Pulmonary fibrosis (HCC)    Skin cancer     . Current Outpatient Medications on File Prior to Visit  Medication Sig Dispense Refill   acetaminophen (TYLENOL) 325 MG tablet Take by mouth as needed.     aspirin-acetaminophen-caffeine (EXCEDRIN MIGRAINE) 250-250-65 MG tablet Take by mouth as needed.     Calcium Carb-Cholecalciferol (CALCIUM/VITAMIN D PO) Take 1 tablet by mouth daily.     Cetirizine HCl (ZYRTEC PO)  Take by mouth.     ESBRIET 801 MG TABS Take 1 tablet (801 mg total) by mouth 3 (three) times daily with meals. 270 tablet 1   fluorouracil (EFUDEX) 5 % cream Apply topically.     fluticasone (FLONASE) 50 MCG/ACT nasal spray Place into both nostrils daily.     gabapentin (NEURONTIN) 300 MG capsule Take 300 mg by mouth 3 (three) times daily.     ipratropium (ATROVENT) 0.06 % nasal spray Place 2 sprays into both nostrils 3 (three) times daily.     loperamide (IMODIUM) 2 MG capsule Take 1 mg by mouth once.     meclizine (ANTIVERT) 25 MG tablet Take 25 mg by mouth 2 (two) times daily as needed.     Multiple Vitamin (MULTIVITAMIN) capsule Take 1 capsule by mouth every morning.     naproxen sodium (ALEVE) 220 MG tablet Take 220 mg by mouth daily as needed.     omeprazole (PRILOSEC) 40 MG capsule Take 40 mg by mouth daily.     PAROXETINE HCL PO Take by mouth.     rosuvastatin (CRESTOR) 5 MG tablet Take 1 tablet by mouth daily.     SUMAtriptan (IMITREX) 100 MG tablet TAKE ONE TABLET BY MOUTH AT ONSET OF HEADACHE; MAY REPEAT ONE TABLET IN 2 HOURS IF NEEDED. MAX OF 200 MG PER DAY     Treprostinil (TYVASO) 0.6 MG/ML SOLN Inhale into the lungs 4 (four) times daily. 10 breaths, increases to 12 breaths on Wednesday.  No current facility-administered medications on file prior to visit.       Observations/Objective: Appears well in nad  Assessment and Plan: Interstitial lung disease clinically appears to be stable.  Continue on Esbriet which he seems to be tolerating well.  Continue with exercise regimen. Check PFTs on return  Pulmonary hypertension related ILD.  Seems to be tolerating Tyvaso well..  Labs on return  Chronic cough.  May use Delsym as needed.  Continue on gabapentin.  Physical deconditioning continue with exercise regimen as tolerated  Plan  Patient Instructions  Continue on Oxygen 2l/m with activity and At bedtime  . Goal is for O2 sats >88-90%.  Activity as tolerated.  Delsym  2 tsp Twice daily  for cough as needed  Sips of water to help cough  Continue on Esbriet Three times a day.  Continue with exercise plan.  Continue on Gabapentin Three times a day.  Continue on Tyvaso 4 times daily Follow up 6-8 weeks with Dr. Marchelle Gearing after PFT  Please contact office for sooner follow up if symptoms do not improve or worsen or seek emergency care       Follow Up Instructions:    I discussed the assessment and treatment plan with the patient. The patient was provided an opportunity to ask questions and all were answered. The patient agreed with the plan and demonstrated an understanding of the instructions.   The patient was advised to call back or seek an in-person evaluation if the symptoms worsen or if the condition fails to improve as anticipated.  I provided 30 minutes of non-face-to-face time during this encounter.   Rubye Oaks, NP

## 2022-07-03 ENCOUNTER — Ambulatory Visit: Payer: Medicare Other | Admitting: Behavioral Health

## 2022-07-05 ENCOUNTER — Ambulatory Visit (INDEPENDENT_AMBULATORY_CARE_PROVIDER_SITE_OTHER): Payer: Medicare Other | Admitting: Behavioral Health

## 2022-07-05 ENCOUNTER — Encounter: Payer: Self-pay | Admitting: Behavioral Health

## 2022-07-05 ENCOUNTER — Telehealth: Payer: Self-pay | Admitting: Internal Medicine

## 2022-07-05 ENCOUNTER — Encounter: Payer: Self-pay | Admitting: Internal Medicine

## 2022-07-05 DIAGNOSIS — F331 Major depressive disorder, recurrent, moderate: Secondary | ICD-10-CM | POA: Diagnosis not present

## 2022-07-05 DIAGNOSIS — F411 Generalized anxiety disorder: Secondary | ICD-10-CM | POA: Diagnosis not present

## 2022-07-05 NOTE — Telephone Encounter (Signed)
Patient would like the nurse to call regarding his medication,   Treprostinil (TYVASO) 0.6 MG/ML SOLN  .  He stated he has some questions.  Please advise and call patient to discuss at [phone]

## 2022-07-05 NOTE — Progress Notes (Signed)
[facility] Counselor Initial Adult Exam  Name: Dustin Dean Date: 07/05/2022 MRN: [id number] DOB: 1950/07/11 PCP: [name], MD  Time spent: 42 minutes from 4:00 PM to 4:42 PM.  This session was held via video teletherapy. The patient consented to the video teletherapy and was located in his home during this session. He is aware it is the responsibility of the patient to secure confidentiality on his end of the session. The provider was in a private home office for the duration of this session.      Guardian/Payee: Self  Paperwork requested: Yes   Reason for Visit /Presenting Problem: Depression The patient went to the beach with his neighbors but also spent a good part of the afternoon and evening with his sister and nephew.  He reported that he was very mindful of his history with his nephew and made sure that it did not say anything that could have created some discord between the 2 of them.  He does not want to be irritable and does not want to be seen as irritable and said the visit went fairly well.  I encouraged practicing mindfulness in situations where increases irritability.  He has been diagnosed with pulmonary hypertension and started on a nebulizer 4 times a day starting at 6 puffs and going up to 12 puffs.  While at the beach and taking 12 puffs he had a gastrointestinal reaction and felt that might have been connected to the nebulizer so he cut back to 12 puffs and started feeling better.  In trying to get in touch with in addition to a nurse with his pharmacy trying to get in touch with his doctor's office he is not getting a response and he has tried phone calls as well as the other nurse has tried phone calls.  We talked about the importance of mindfulness there but also reaching out through my chart or as a second step asking for the practices supervisor if he does not get an answer with a reasonable amount of time.  He said even as a last resort he could go to  the office just to be able to speak to somebody and would do so in a very mindful manner.  He also needs his Paxil refilled fairly soon so we talked about calling the practice that he was getting that filled with previously to see if they would refill that if not as a second step in speaking with his primary care physician to see if he could maintain that and as a third step we could call for a referral to [facility].  He felt fairly sure that the practice to prescribe it could continue his medication.    He does contract for safety having no thoughts of hurting himself or anyone else. Mental Status Exam: Appearance:   Fairly Groomed     Behavior:  Appropriate  Motor:  Normal  Speech/Language:   Clear and Coherent and Normal Rate  Affect:  Appropriate  Mood:  normal  Thought process:  normal  Thought content:    WNL  Sensory/Perceptual disturbances:    WNL  Orientation:  oriented to person, place, time/date, situation, and day of week  Attention:  Good  Concentration:  Good  Memory:  WNL  Fund of knowledge:   Good  Insight:    Good  Judgment:   Good  Impulse Control:  Good     Reported Symptoms: Depression  Risk Assessment: Danger to Self:  No Self-injurious Behavior: No  Danger to Others: No Duty to Warn:no Physical Aggression / Violence:No  Access to Firearms a concern: No  Gang Involvement:No  Patient / guardian was educated about steps to take if suicide or homicide risk level increases between visits: n/a While future psychiatric events cannot be accurately predicted, the patient does not currently require acute inpatient psychiatric care and does not currently meet [facility] involuntary commitment criteria.  Substance Abuse History: Current substance abuse: No     Past Psychiatric History:   Previous psychological history is significant for depression Outpatient Providers: Primary care physician History of Psych Hospitalization:  None  reported Psychological Testing:  n/a    Abuse History:  Victim of: No.,  None reported    Report needed: No. Victim of Neglect:No. Perpetrator of n/a Witness / Exposure to Domestic Violence: None reported  Protective Services Involvement: No  Witness to [employer] Violence:  No   Family History:  Family History  Problem Relation Age of Onset   Cancer Mother    Congestive Heart Failure Father    Prostate cancer Father    Healthy Sister     Living situation: the patient lives with their spouse  Sexual Orientation: Straight  Relationship Status: married  Name of spouse / other: Did not discuss If a parent, number of children / ages: Adults  Support Systems: spouse  Financial Stress:  No   Income/Employment/Disability: Doctor, general practice: No   Educational History: Education: high school diploma/GED  Religion/Sprituality/World View: Did not discuss  Any cultural differences that may affect / interfere with treatment:  not applicable   Recreation/Hobbies: Time with family  Stressors: Health problems    Strengths: Supportive Relationships, Hopefulness, Self Advocate, and Able to Communicate Effectively  Barriers:     Legal History: Pending legal issue / charges: The patient has no significant history of legal issues. History of legal issue / charges:  Not applicable  Medical History/Surgical History: reviewed Past Medical History:  Diagnosis Date   Aortic stenosis    Flu    Pneumonia    Prostate cancer (HCC)    Pulmonary fibrosis (HCC)    Skin cancer     Past Surgical History:  Procedure Laterality Date   HERNIA REPAIR     NASAL SEPTUM SURGERY     SKIN CANCER EXCISION      Medications: Current Outpatient Medications  Medication Sig Dispense Refill   acetaminophen (TYLENOL) 325 MG tablet Take by mouth as needed.     aspirin-acetaminophen-caffeine (EXCEDRIN MIGRAINE) 250-250-65 MG tablet Take by mouth as needed.      Calcium Carb-Cholecalciferol (CALCIUM/VITAMIN D PO) Take 1 tablet by mouth daily.     Cetirizine HCl (ZYRTEC PO) Take by mouth.     ESBRIET 801 MG TABS Take 1 tablet (801 mg total) by mouth 3 (three) times daily with meals. 270 tablet 1   fluorouracil (EFUDEX) 5 % cream Apply topically.     fluticasone (FLONASE) 50 MCG/ACT nasal spray Place into both nostrils daily.     gabapentin (NEURONTIN) 300 MG capsule Take 300 mg by mouth 3 (three) times daily.     ipratropium (ATROVENT) 0.06 % nasal spray Place 2 sprays into both nostrils 3 (three) times daily.     loperamide (IMODIUM) 2 MG capsule Take 1 mg by mouth once.     meclizine (ANTIVERT) 25 MG tablet Take 25 mg by mouth 2 (two) times daily as needed.     Multiple Vitamin (MULTIVITAMIN) capsule Take 1 capsule by  mouth every morning.     naproxen sodium (ALEVE) 220 MG tablet Take 220 mg by mouth daily as needed.     omeprazole (PRILOSEC) 40 MG capsule Take 40 mg by mouth daily.     PAROXETINE HCL PO Take by mouth.     rosuvastatin (CRESTOR) 5 MG tablet Take 1 tablet by mouth daily.     SUMAtriptan (IMITREX) 100 MG tablet TAKE ONE TABLET BY MOUTH AT ONSET OF HEADACHE; MAY REPEAT ONE TABLET IN 2 HOURS IF NEEDED. MAX OF 200 MG PER DAY     Treprostinil (TYVASO) 0.6 MG/ML SOLN Inhale into the lungs 4 (four) times daily. 10 breaths, increases to 12 breaths on Wednesday.     No current facility-administered medications for this visit.    Allergies  Allergen Reactions   Bee Venom Rash   Shellfish Allergy Hives   Nintedanib Other (See Comments)    SIRS, fevers   Pseudoephedrine Anxiety    Diagnoses:  Major depressive disorder, recurrent, moderate, irritability Plan of Care: I will meet with the patient every 2 to 3 weeks in person                                   French Ana, [facility]

## 2022-07-06 NOTE — Telephone Encounter (Signed)
Closing encounter. Since [facility] has sent patient my chart message

## 2022-07-17 ENCOUNTER — Encounter: Payer: Self-pay | Admitting: Behavioral Health

## 2022-07-17 ENCOUNTER — Ambulatory Visit (INDEPENDENT_AMBULATORY_CARE_PROVIDER_SITE_OTHER): Payer: Medicare Other | Admitting: Behavioral Health

## 2022-07-17 DIAGNOSIS — F411 Generalized anxiety disorder: Secondary | ICD-10-CM | POA: Diagnosis not present

## 2022-07-17 NOTE — Progress Notes (Signed)
[facility] Counselor Initial Adult Exam  Name: Dustin Dean Date: 07/17/2022 MRN: [id number] DOB: 28-Jan-1950 PCP: [name], MD  Time spent: 56 minutes, 8 AM to 8:56 AM.  This visit was in person.     Guardian/Payee: Self  Paperwork requested: Yes   Reason for Visit /Presenting Problem: Depression The past couple of weeks have gone well for the patient.  He reports minimal anxiety.  He has spoken to his doctor and they have come up with a number of puffs where his nebulizer that feels more comfortable without any side effects.  He will be going to see his sister leaving tomorrow and will see his nephew that he has a good relationship with.  He is looking forward to that.  We did talk more about family history especially the history of his relationship with his sister and his parents.  We also looked more at his work history and the human resources available.  It was agreed he did for a long time and did well.  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  He is using anxiety reduction techniques effectively.  Physically he feels fairly good.  Because of this therapist taking some vacation, his next session will be a little bit further which he is okay with.   He does contract for safety having no thoughts of hurting himself or anyone else. Mental Status Exam: Appearance:   Fairly Groomed     Behavior:  Appropriate  Motor:  Normal  Speech/Language:   Clear and Coherent and Normal Rate  Affect:  Appropriate  Mood:  normal  Thought process:  normal  Thought content:    WNL  Sensory/Perceptual disturbances:    WNL  Orientation:  oriented to person, place, time/date, situation, and day of week  Attention:  Good  Concentration:  Good  Memory:  WNL  Fund of  knowledge:   Good  Insight:    Good  Judgment:   Good  Impulse Control:  Good     Reported Symptoms: Depression  Risk Assessment: Danger to Self:  No Self-injurious Behavior: No Danger to Others: No Duty to Warn:no Physical Aggression / Violence:No  Access to Firearms a concern: No  Gang Involvement:No  Patient / guardian was educated about steps to take if suicide or homicide risk level increases between visits: n/a While future psychiatric events cannot be accurately predicted, the patient does not currently require acute inpatient psychiatric care and does not currently meet [facility] involuntary commitment criteria.  Substance Abuse History: Current substance abuse: No     Past Psychiatric History:   Previous psychological history is significant for depression Outpatient Providers: Primary care physician History of Psych Hospitalization:  None reported Psychological Testing:  n/a    Abuse History:  Victim of: No.,  None reported    Report needed: No. Victim of Neglect:No. Perpetrator of n/a Witness / Exposure to Domestic Violence: None reported  Protective Services Involvement: No  Witness to [employer] Violence:  No   Family History:  Family History  Problem Relation Age of Onset   Cancer Mother    Congestive Heart Failure Father    Prostate cancer Father    Healthy Sister     Living situation: the patient lives  with their spouse  Sexual Orientation: Straight  Relationship Status: married  Name of spouse / other: Did not discuss If a parent, number of children / ages: Adults  Support Systems: spouse  Financial Stress:  No   Income/Employment/Disability: Doctor, general practice: No   Educational History: Education: high school diploma/GED  Religion/Sprituality/World View: Did not discuss  Any cultural differences that may affect / interfere with treatment:  not applicable   Recreation/Hobbies: Time with  family  Stressors: Health problems    Strengths: Supportive Relationships, Hopefulness, Self Advocate, and Able to Communicate Effectively  Barriers:     Legal History: Pending legal issue / charges: The patient has no significant history of legal issues. History of legal issue / charges:  Not applicable  Medical History/Surgical History: reviewed Past Medical History:  Diagnosis Date   Aortic stenosis    Flu    Pneumonia    Prostate cancer (HCC)    Pulmonary fibrosis (HCC)    Skin cancer     Past Surgical History:  Procedure Laterality Date   HERNIA REPAIR     NASAL SEPTUM SURGERY     SKIN CANCER EXCISION      Medications: Current Outpatient Medications  Medication Sig Dispense Refill   acetaminophen (TYLENOL) 325 MG tablet Take by mouth as needed.     aspirin-acetaminophen-caffeine (EXCEDRIN MIGRAINE) 250-250-65 MG tablet Take by mouth as needed.     Calcium Carb-Cholecalciferol (CALCIUM/VITAMIN D PO) Take 1 tablet by mouth daily.     Cetirizine HCl (ZYRTEC PO) Take by mouth.     ESBRIET 801 MG TABS Take 1 tablet (801 mg total) by mouth 3 (three) times daily with meals. 270 tablet 1   fluorouracil (EFUDEX) 5 % cream Apply topically.     fluticasone (FLONASE) 50 MCG/ACT nasal spray Place into both nostrils daily.     gabapentin (NEURONTIN) 300 MG capsule Take 300 mg by mouth 3 (three) times daily.     ipratropium (ATROVENT) 0.06 % nasal spray Place 2 sprays into both nostrils 3 (three) times daily.     loperamide (IMODIUM) 2 MG capsule Take 1 mg by mouth once.     meclizine (ANTIVERT) 25 MG tablet Take 25 mg by mouth 2 (two) times daily as needed.     Multiple Vitamin (MULTIVITAMIN) capsule Take 1 capsule by mouth every morning.     naproxen sodium (ALEVE) 220 MG tablet Take 220 mg by mouth daily as needed.     omeprazole (PRILOSEC) 40 MG capsule Take 40 mg by mouth daily.     PAROXETINE HCL PO Take by mouth.     rosuvastatin (CRESTOR) 5 MG tablet Take 1 tablet by  mouth daily.     SUMAtriptan (IMITREX) 100 MG tablet TAKE ONE TABLET BY MOUTH AT ONSET OF HEADACHE; MAY REPEAT ONE TABLET IN 2 HOURS IF NEEDED. MAX OF 200 MG PER DAY     Treprostinil (TYVASO) 0.6 MG/ML SOLN Inhale into the lungs 4 (four) times daily. 10 breaths, increases to 12 breaths on Wednesday.     No current facility-administered medications for this visit.    Allergies  Allergen Reactions   Bee Venom Rash   Shellfish Allergy Hives   Nintedanib Other (See Comments)    SIRS, fevers   Pseudoephedrine Anxiety    Diagnoses:  Major depressive disorder, recurrent, moderate, irritability Plan of Care: I will meet with the patient every 2 to 3 weeks in person  French Ana, [facility]                  French Ana, [facility]

## 2022-08-07 ENCOUNTER — Encounter: Payer: Self-pay | Admitting: Behavioral Health

## 2022-08-07 ENCOUNTER — Ambulatory Visit (INDEPENDENT_AMBULATORY_CARE_PROVIDER_SITE_OTHER): Payer: Medicare Other | Admitting: Behavioral Health

## 2022-08-07 DIAGNOSIS — F411 Generalized anxiety disorder: Secondary | ICD-10-CM

## 2022-08-07 NOTE — Progress Notes (Signed)
[facility] Counselor Initial Adult Exam  Name: Dustin Dean Date: 08/07/2022 MRN: [id number] DOB: February 26, 1950 PCP: [name], MD  Time spent: 58 minutes, 1:00 PM to 1:58 PM  This visit was in person.   Guardian/Payee: Self  Paperwork requested: Yes   Reason for Visit /Presenting Problem: Depression Patient had a good visit with his sister and nephew and said it was stress free.  We are not using any issues when he is with them but said the like he managed his irritability very well.  They continue to spend time with friends which they enjoy.  They will be taking beach trip in September which he is looking forward to.  He did report that over the past few weeks he feels his mood has been somewhat "blah."  He does not feel depressed but does not have a lot of motivation or energy.  As we explored it appears that is somewhat connected to her routine is set every day sameness.  We used washing dishes as an example of something that is routine for him and talked about different ways to break that up.  When he moved into the house that they were and he had designs for what he would do with the yard and keeping it up and making it look nice and because of recent medical conditions is not able to do what he wanted to do.  When first diagnosed his doctor told him not at all but he is now allowed to do a few things but says he can do all that is needed to do.  Financially he says that he can afford it but he is always responsible with his money and is a little hesitant.  We talked about him taking several things that he can do physically even if it takes a while and then reaching out to a lawn care type service to help him with the rest.  We talked about the sense of purpose in being able to do the himself but the pleasure he will get from it eventually being the way he wants it to look at.  As we went further between sessions last time he said the distance in time was fine and we will  stay with that moving forward.  He has several appointments scheduled.  He does contract for safety having no thoughts of hurting himself or anyone else. Mental Status Exam: Appearance:   Fairly Groomed     Behavior:  Appropriate  Motor:  Normal  Speech/Language:   Clear and Coherent and Normal Rate  Affect:  Appropriate  Mood:  normal  Thought process:  normal  Thought content:    WNL  Sensory/Perceptual disturbances:    WNL  Orientation:  oriented to person, place, time/date, situation, and day of week  Attention:  Good  Concentration:  Good  Memory:  WNL  Fund of knowledge:   Good  Insight:    Good  Judgment:   Good  Impulse Control:  Good     Reported Symptoms: Depression  Risk Assessment: Danger to Self:  No Self-injurious Behavior: No Danger to Others: No Duty to Warn:no Physical Aggression / Violence:No  Access to Firearms a concern: No  Gang Involvement:No  Patient / guardian was educated about steps to take if suicide or homicide risk level increases between visits: n/a While future psychiatric events cannot be accurately predicted, the patient does not currently require acute inpatient psychiatric care and does not currently meet [facility] involuntary commitment criteria.  Substance Abuse History: Current substance abuse: No     Past Psychiatric History:   Previous psychological history is significant for depression Outpatient Providers: Primary care physician History of Psych Hospitalization:  None reported Psychological Testing:  n/a    Abuse History:  Victim of: No.,  None reported    Report needed: No. Victim of Neglect:No. Perpetrator of n/a Witness / Exposure to Domestic Violence: None reported  Protective Services Involvement: No  Witness to [employer] Violence:  No   Family History:  Family History  Problem Relation Age of Onset   Cancer Mother    Congestive Heart Failure Father    Prostate cancer Father    Healthy Sister      Living situation: the patient lives with their spouse  Sexual Orientation: Straight  Relationship Status: married  Name of spouse / other: Did not discuss If a parent, number of children / ages: Adults  Support Systems: spouse  Financial Stress:  No   Income/Employment/Disability: Doctor, general practice: No   Educational History: Education: high school diploma/GED  Religion/Sprituality/World View: Did not discuss  Any cultural differences that may affect / interfere with treatment:  not applicable   Recreation/Hobbies: Time with family  Stressors: Health problems    Strengths: Supportive Relationships, Hopefulness, Self Advocate, and Able to Communicate Effectively  Barriers:     Legal History: Pending legal issue / charges: The patient has no significant history of legal issues. History of legal issue / charges:  Not applicable  Medical History/Surgical History: reviewed Past Medical History:  Diagnosis Date   Aortic stenosis    Flu    Pneumonia    Prostate cancer (HCC)    Pulmonary fibrosis (HCC)    Skin cancer     Past Surgical History:  Procedure Laterality Date   HERNIA REPAIR     NASAL SEPTUM SURGERY     SKIN CANCER EXCISION      Medications: Current Outpatient Medications  Medication Sig Dispense Refill   acetaminophen (TYLENOL) 325 MG tablet Take by mouth as needed.     aspirin-acetaminophen-caffeine (EXCEDRIN MIGRAINE) 250-250-65 MG tablet Take by mouth as needed.     Calcium Carb-Cholecalciferol (CALCIUM/VITAMIN D PO) Take 1 tablet by mouth daily.     Cetirizine HCl (ZYRTEC PO) Take by mouth.     ESBRIET 801 MG TABS Take 1 tablet (801 mg total) by mouth 3 (three) times daily with meals. 270 tablet 1   fluorouracil (EFUDEX) 5 % cream Apply topically.     fluticasone (FLONASE) 50 MCG/ACT nasal spray Place into both nostrils daily.     gabapentin (NEURONTIN) 300 MG capsule Take 300 mg by mouth 3 (three) times daily.      ipratropium (ATROVENT) 0.06 % nasal spray Place 2 sprays into both nostrils 3 (three) times daily.     loperamide (IMODIUM) 2 MG capsule Take 1 mg by mouth once.     meclizine (ANTIVERT) 25 MG tablet Take 25 mg by mouth 2 (two) times daily as needed.     Multiple Vitamin (MULTIVITAMIN) capsule Take 1 capsule by mouth every morning.     naproxen sodium (ALEVE) 220 MG tablet Take 220 mg by mouth daily as needed.     omeprazole (PRILOSEC) 40 MG capsule Take 40 mg by mouth daily.     PAROXETINE HCL PO Take by mouth.     rosuvastatin (CRESTOR) 5 MG tablet Take 1 tablet by mouth daily.     SUMAtriptan (IMITREX) 100 MG tablet  TAKE ONE TABLET BY MOUTH AT ONSET OF HEADACHE; MAY REPEAT ONE TABLET IN 2 HOURS IF NEEDED. MAX OF 200 MG PER DAY     Treprostinil (TYVASO) 0.6 MG/ML SOLN Inhale into the lungs 4 (four) times daily. 10 breaths, increases to 12 breaths on Wednesday.     No current facility-administered medications for this visit.    Allergies  Allergen Reactions   Bee Venom Rash   Shellfish Allergy Hives   Nintedanib Other (See Comments)    SIRS, fevers   Pseudoephedrine Anxiety    Diagnoses:  Major depressive disorder, recurrent, moderate, irritability Plan of Care: I will meet with the patient every 2 to 3 weeks in person                                   French Ana, [facility]                  French Ana, [facility]               French Ana, [facility]

## 2022-08-14 ENCOUNTER — Ambulatory Visit (INDEPENDENT_AMBULATORY_CARE_PROVIDER_SITE_OTHER): Payer: Medicare Other | Admitting: Internal Medicine

## 2022-08-14 DIAGNOSIS — J849 Interstitial pulmonary disease, unspecified: Secondary | ICD-10-CM | POA: Diagnosis not present

## 2022-08-14 LAB — PULMONARY FUNCTION TEST
DL/VA % pred: 109 %
DL/VA: 4.46 ml/min/mmHg/L
DLCO cor % pred: 62 %
DLCO cor: 15.13 ml/min/mmHg
DLCO unc % pred: 62 %
DLCO unc: 15.13 ml/min/mmHg
FEF 25-75 Pre: 1.72 L/sec
FEF2575-%Pred-Pre: 75 %
FEV1-%Pred-Pre: 53 %
FEV1-Pre: 1.62 L
FEV1FVC-%Pred-Pre: 116 %
FEV6-%Pred-Pre: 48 %
FEV6-Pre: 1.87 L
FEV6FVC-%Pred-Pre: 106 %
FVC-%Pred-Pre: 45 %
FVC-Pre: 1.88 L
Pre FEV1/FVC ratio: 86 %
Pre FEV6/FVC Ratio: 100 %

## 2022-08-14 NOTE — Patient Instructions (Signed)
Spiro/DLCO performed today. 

## 2022-08-14 NOTE — Progress Notes (Signed)
Spiro/DLCO performed today. 

## 2022-08-20 ENCOUNTER — Ambulatory Visit: Payer: Medicare Other | Admitting: Behavioral Health

## 2022-08-21 ENCOUNTER — Encounter: Payer: Self-pay | Admitting: Internal Medicine

## 2022-08-21 ENCOUNTER — Telehealth (INDEPENDENT_AMBULATORY_CARE_PROVIDER_SITE_OTHER): Payer: Medicare Other | Admitting: Internal Medicine

## 2022-08-21 VITALS — Wt 196.0 lb

## 2022-08-21 DIAGNOSIS — J9611 Chronic respiratory failure with hypoxia: Secondary | ICD-10-CM | POA: Diagnosis not present

## 2022-08-21 DIAGNOSIS — I272 Pulmonary hypertension, unspecified: Secondary | ICD-10-CM

## 2022-08-21 DIAGNOSIS — J849 Interstitial pulmonary disease, unspecified: Secondary | ICD-10-CM

## 2022-08-21 NOTE — Patient Instructions (Addendum)
ICD-10-CM   1. Chronic respiratory failure with hypoxia (HCC)  J96.11     2. ILD (interstitial lung disease) (HCC)  J84.9     3. Pulmonary hypertension (HCC)  I27.20       Plan  - inpatient mgmt per [facility] - continue esbrit and tyvaso  Followup  - < 2 weeks with APP here for face to face

## 2022-08-21 NOTE — Progress Notes (Signed)
Cardiology Dr. Dyane Dustman 02/15/20 Dustin Dean is 72 y.o. year old male with a history of No prior cardiovascular disease who presents to [facility] cardiology for the evaluation of a heart murmur. This patient is a non-smoker and denies a previous history of myocardial infarction, congestive heart failure, or valvular heart disease. 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 He really has little in the way of exertional chest symptoms. Occasionally according to the wife if he walks up an incline briskly he may have some minor shortness of breath but remains active and has little in the way limitation. He denies a previous history of myocardial infarction, congestive heart failure, or known valvular heart disease. At a recent examination he was found to have a heart murmur and an echocardiogram was performed. It demonstrated a peak gradient across his aortic valve of 30 mmHg and an aortic valve area of 1.1 cm. This was read as being consistent with moderate aortic stenosis. I had a fairly significant discussion with the patient and his wife about the implications of his aortic valve and the need to follow the aortic stenosis progression over time.  11/14/2020 office visit with Dustin Dean pulmonary physician assistant at [facility] has past medical history of restrictive lung disease, interstitial lung disease, allergic rhinitis, laryngeal pharyngeal reflux, arthritis, lung nodules and persistent cough. He is a non-smoker and has never smoked. He has medication allergy to pseudoephedrine.  Dustin Dean presents today with complaint of a progressively worsening shortness of breath and dyspnea on exertion over the past several months. We have been following his interstitial lung disease since 2018. He has been stable without complaint of dyspnea on exertion over the past several years. He indicates that recently he has had more shortness  of breath with exertion. He and his wife will walk the dog in their usual route through the neighborhood that they have done for many years. He reports that he will get short of breath along the root in a way that he has never been bothered with previously. He has recently requested an albuterol inhaler and reports that it has been helpful. He has been using Symbicort 160/4.5 twice daily but has been having difficulty using it successfully as he has been coughing significantly and loses a lot of the medication. A recent chest CT conducted 10/24/2020 notes a progression in the interstitial changes. A 9 mm right lower lobe nodule is also noted. The plan is to have a 51-month follow-up chest CT conducted 02/04/2021. A 6-minute walk test conducted today shows oxygen desaturation and need for supplemental oxygen. I will place the order for supplemental oxygen. I will also refer him to the interstitial lung clinic at [facility] for further evaluation and treatment.   Patient at rest on RA with baseline sats : 97% After exertion for 3 min on RA pt's sats were : 86% Patient placed on oxygen via nasal cannula at 2 lpm flow via portable oxygen concentrator device Exertion for 6 mins with oxygen confirmed final sats at 94%   #1 lung nodules Chest CT 10/24/2020 shows 9 mm right lower lobe lung nodule Follow-up chest CT scheduled for 55-month reevaluation in January 2023  2. Interstitial lung disease Chest CT 11/04/2020 notes fibrotic interstitial lung disease with evidence of progression compared to 08/28/2020 Referral to interstitial lung clinic at [facility] for further evaluation and treatment   #Primary  Care dec 2022 Osteopenia Due for DXA. Prostate cancer Saw Dr. Marlou Dean at [facility]. Biopsy + for cancer. They are just watching it. He is going to have MRI and another biopsy next visit.   OV 01/24/2021 -evaluation and transfer of care to Dr. Marchelle Dean in ILD center in [place].  Referred by  Dustin Dean patient support group leader for the pulmonary fibrosis foundation support group  Subjective:  Patient ID: Dustin Dean, male , DOB: Jun 07, 1950 , age 35 y.o. , MRN: [id number] , ADDRESS: [address] [place] [address] PCP Dustin Canterbury, MD Patient Care Team: Dustin Canterbury, MD as PCP - General (Family Medicine)  This Provider for this visit: Treatment Team:  Attending Provider: Kalman Shan, MD    01/24/2021 -   Chief Complaint  Patient presents with   Consult    Pt is here for a switch of providers to take over care for his pulmonary fibrosis. 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 Pt does become SOB with activities and also states that he does have a chronic cough.     HPI  Dustin Dean 72 y.o. -new evaluation for interstitial lung disease.  History is provided by review of the chart and also talking to the patient and his wife.  He tells me that he has had chronic cough since his teenage years.  But for the last few to several years its been more consistent.  He was seeing the physician assistant at [facility].  Somewhere along the way he got referred to Dr. Providence Crosby right at [facility] some 3 years ago.  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  After that cough is largely improved except for occasional exacerbations in the fall in the winter.  Then in 2018 sometime after he went on gabapentin he was informed by the physician assistant that he might have early ILD.  He does not recollect any serology or biopsy being done.  He does recollect a few pulmonary function test.  Then all of a sudden starting September 2022 his cough "got out of hand" and also developed worsening shortness of breath.  This the first time he started developing worsening shortness of breath.  Then by November 2022 started needing exertional oxygen leading to current symptoms.  He was referred to [facility] but he preferred to establish with the  ILD center here in [place] with [place] based on [facility] recommendation.  He found Dustin Dean through Internet search for pulmonary fibrosis foundation.  His current symptom score is below.  He is not on any antifibrotic.  [place] Integrated Comprehensive ILD Questionnaire       Past Medical History :  -He has obesity BMI 33-34 - Denies any collagen vascular disease - He is known to have aortic stenosis -sees cardiologist at [facility].  December 2021 echocardiogram EF 55 to 60% with tricuspid aortic valve and moderate stenosis normal right ventricle -Denies any asthma or COPD.  Denies any heart failure denies any collagen vascular disease -Denies snoring or excessive daytime somnolence.  Denies stroke.  Denies formal diagnosis of sleep apnea denies formal diagnosis of pulmonary hypertension [echo a year ago did not show pulmonary hypertension] -Has had vaccination against COVID but has not had COVID disease -Recent diagnosis of late 2022 early stage prostate cancer at [facility] urology on observation treatment -Has history of skin cancer for which he apply sunscreen regularly  ROS:  -Shortness of breath present Cough  chronic plus -Has intermittent chronic diarrhea and takes Imodium but no other GI side effects -Possible Raynaud's for the last several decades  FAMILY HISTORY of LUNG DISEASE:  Denies family for pulmonary fibrosis.  Denies family Struve COPD or asthma sarcoid or cystic fibrosis.  Denies family history of hypersensitive pneumonitis.  Denies autoimmune disease.  Denies any premature graying of the hair or albinism  PERSONAL EXPOSURE HISTORY:  No prior cigarette smoking -No marijuana use of cocaine use no intravenous drug use.  HOME  EXPOSURE and HOBBY DETAILS :  -Single-family home in a one-story space.  [place] N[place] setting age of the current home is 31 years.  He is lived there for 6 years.  He does use a feather pillow and this occasionally mold or mildew in the  bathroom.  All his life he is done active gardening with mulch and woodchips and occasionally damp soil.  He is worked out in the yard quite a bit and does gardening.  He enjoys the gardening.  OCCUPATIONAL HISTORY (122 questions) : -He worked in the [employer] at [employer] and then [employer] no [employer].  He does not remember if the buildings had mold in it.  He does not think so -Detail greater than 100 question organic and inorganic antigen history exposure is negative  PULMONARY TOXICITY HISTORY (27 items):  Denies  INVESTIGATIONS: Report of pulmonary function test but I am not able to get it in Care Everywhere       High-resolution CT scan of the chest August 2020  -There is a report of honeycombing but without any craniocaudal gradient.  Air-trapping reported alternative pattern more suggestive of chronic hypersensitive pneumonitis.  But there is already progression from August 2019 -Read by Dr. Trudie Reed   CT Chest data- HRCT 11/04/20 unable for my visualization.   CLINICAL DATA:  Worsening shortness of breath   EXAM:  CT CHEST WITHOUT CONTRAST   TECHNIQUE:  Multidetector CT imaging of the chest was performed following the  standard protocol without intravenous contrast. High resolution  imaging of the lungs, as well as inspiratory and expiratory imaging,  was performed.   COMPARISON:  Chest CT dated August 29, 2018   FINDINGS:  Cardiovascular: Normal heart size. No pericardial effusion. Coronary  artery calcifications of the circumflex. Aortic valve  calcifications. Minimal calcified plaque of the thoracic aorta.   Mediastinum/Nodes: Mildly enlarged mediastinal lymph nodes,  unchanged compared to prior exam and likely reactive. Esophagus and  thyroid are unremarkable.   Lungs/Pleura: Central airways are patent. Bilateral air trapping.  Peribronchovascular and subpleural reticular opacities with traction  bronchiectasis and  no clear craniocaudal predominance. Honeycomb  change primarily seen in the anterior upper lobes is increased when  compared to prior exam. New linear nodular opacity of the right  lower lobe measuring up to 9 mm on series 4, image 210.   Upper Abdomen: No acute abnormality.   Musculoskeletal: No chest wall mass or suspicious bone lesions  identified.   IMPRESSION:  Fibrotic interstitial lung disease with evidence of progression when  compared with August 29, 2018 prior exam. Favor fibrotic  hypersensitivity pneumonitis given presence of air trapping.  Findings are suggestive of an alternative diagnosis (not UIP) per  consensus guidelines: Diagnosis of Idiopathic Pulmonary Fibrosis: An  Official ATS/ERS/JRS/ALAT Clinical Practice Guideline. Am Rosezetta Schlatter  Crit Care Med Vol 198, Iss 5, [phone], Sep 15 2016.   New linear nodular opacity of the right lower lobe measuring up  to 9  mm, likely an area of focal fibrosis. Recommend follow-up chest CT  in 3 months to ensure stability.   Aortic Atherosclerosis (ICD10-I70.0).   Addendum 01/26/2021 -radiology Dr. Llana Aliment wrote back saying CT scan is classic for chronic HP.  We will call the patient and get the patient started on nintedanib counseling which is first-line for progressive non-- IPF ILD.  If he is reluctant then we will do pirfenidone.  We will set up pharmacy counseling.  If he is reluctant for nintedanib based on risk versus benefit we will do pirfenidone  Electronically Signed    By: Allegra Lai M.D.    On: 11/04/2020 14:17  No results found.   Phone visit 01/28/21  Emilyh  LEt Dustin Dean know ahead of the visit followup with me on 02/23/21   A) making referral to [facility] to discuss ofev which would be first line baed on what radiologist told me about CT and fact rheumaotid facot is positive  B) rheumatoid factor strongly positive - refer rheumatology Dr Deveshwar/Rice  C) I reviewed echo report (below) Will discuss  at followup  Aortic Valve: There is moderate stenosis, with peak and mean gradients  of 44.000 and 27.000 mmHg.  Aortic valve area calculates to approximately  1.4 cm    Mitral Valve: There is mild regurgitation.    Tricuspid Valve: There is mild regurgitation.    Tricuspid Valve: The right ventricular systolic pressure is normal (<36  mmHg).   1.  Adequate 2D M-mode and color flow Doppler echocardiogram demonstrates  normal left ventricular chamber size and contractility.  There may be mild  left ventricular hypertrophy.  Ejection fraction is estimated 65%  2.  The aortic valve is thickened and demonstrates reduced leaflet  mobility.  There is mild to moderate aortic stenosis with aortic valve  area calculating to 1.4 cm  3.  Mild mitral annular calcification is noted but there is good leaflet  mobility.  Mild mitral insufficiency is noted  4.  Tricuspid valve appears to be normal  5.  The atria are grossly normal size bilaterally  6.  The right ventricular chamber size and function is normal  6.  The pericardium is of normal thickness there is no effusion  OV 02/23/2021  Subjective:  Patient ID: Dustin Dean, male , DOB: [birth year] , age 35 y.o. , MRN: [id number] , ADDRESS: [address] [place] [address] PCP Dustin Canterbury, MD Patient Care Team: Dustin Canterbury, MD as PCP - General (Family Medicine)  This Provider for this visit: Treatment Team:  Attending Provider: Kalman Shan, MD    02/23/2021 -   Chief Complaint  Patient presents with   Follow-up    Pt recently had a HRCT and PFT and is here today to discuss the results.   Chronic cough on gabapentin  Interstitial lung disease work-up in progress -concern for hypersensitive pneumonitis  -Progressive phenotype between August 2020 and October 2022  9 mm right lower lobe nodule seen in October 2022 for the first time: Stable January 2023  HPI Dustin Dean 72 y.o. -since his last visit Dr. Llana Aliment the  radiologist did look at the CT scan and this confirmed his CT is not consistent with UIP but more likely an alternate pattern consistent with hypersensitive pneumonitis as seen by air trapping.  Patient has had serology work-up in which it is all negative except rheumatoid factor strongly positive.  He is yet to see the rheumatologist.  He does have significant  restrictions on the PFTs with reduced DLCO consistent with his obesity and ILD.  His symptoms are overall stable.  Did go through his exposure history again.  He says in 200 07/2006 and again in 2012 he had had 3 episodes of pneumonia.  All of this happened when he entered school buildings.  He was living in the same house at that time but the house itself was brand-new and there is no mold or mildew.  He is wondering about crawlspace because only [place] he has seen crawlspace but he said he has never been in the crawl space.  He does not know if there was mold in the crawl space.  I told him it is doubtful that mold or mildew from the crawlspace can get into the house.  He did have a feather pillow but since his last visit he is thrown that out.  He does do gardening and does get exposed to dampness.  At this point in time did discuss with him that he does have ILD and it is progressive.  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  Unclear if the positive rheumatoid factor is playing a role it could be if he has positive for rheumatoid arthritis.  Did indicate that ultimately only a biopsy can put to rest if he has IPF [being Caucasian gentleman greater than 65 is classic phenotype for IPF] but did indicate to him that given progression does not matter whether it is IPF or non-IPF antifibrotic's is indicated.  He is already met with the pharmacist and discussed the 2 antifibrotic's.  Did indicate to him that nintedanib is first-line and non-- IPF progressive phenotype.  Did discuss about the diarrhea.  Denies any  contraindication.  Therefore we will start this.  Did discuss whether diarrhea is willing to go through this.  Also discussed about ILD-Pro registry.  He is interested given the consent form.  Did indicate to him that it probably would be beneficial to do some kind of a limited work-up in differentiating his etiology.  The reasons would be to participate in clinical trials but also in case he needs immunosuppression such as prednisone or CellCept [indicated for hypersensitive pneumonitis but not for IPF] having a specific diagnosis would be beneficial.  I did think that his obesity puts him at some risk for getting surgical lung biopsy and therefore it might be better to start off with bronchoscopy with lavage and transbronchial biopsies.  We can address this in the future.  This visit was just focused on therapies.  Regarding his 9 mm lower lobe nodule: He had a CT scan of the chest and shows this is stable from October 2022 through January 2023.  The CT scan again reads as alternative pattern consistent with chronic HP.    CT Chest data January 2023  No results found.  Mediastinum/Nodes: Multiple prominent borderline but nonenlarged mediastinal and bilateral hilar lymph nodes, similar to the prior study esophagus is unremarkable in appearance. No axillary lymphadenopathy.   Lungs/Pleura: High-resolution images again demonstrate widespread but patchy areas of ground-glass attenuation, septal thickening, subpleural reticulation, thickening of the peribronchovascular interstitium, cylindrical and varicose traction bronchiectasis, peripheral bronchiolectasis and extensive honeycombing. There is no discernible craniocaudal gradient. The most extensive areas of honeycombing are anterior lung, predominantly in the upper lobes and right middle lobe. Some areas of the lung bases demonstrate relative sparing. Inspiratory and expiratory imaging demonstrates mild to moderate air trapping indicative of  small airways disease. No definite progression  compared to the recent prior study. No acute consolidative airspace disease. No pleural effusions. Previously described nodular area of architectural distortion in the right lower lobe (axial image 106 of series 8) is stable measuring 8 mm on today's examination, likely part of the underlying fibrosis. No other definite suspicious appearing pulmonary nodules or masses are noted.   Upper Abdomen: Unremarkable.   Musculoskeletal: There are no aggressive appearing lytic or blastic lesions noted in the visualized portions of the skeleton.   IMPRESSION: 1. 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 2. Previously noted nodular area of architectural distortion in the right lower lobe is unchanged, likely part of the fibrosis rather than a pulmonary nodule. 3. There are calcifications of the aortic valve and mitral annulus. Echocardiographic correlation for evaluation of potential valvular dysfunction may be warranted if clinically indicated. 4. Aortic atherosclerosis, in addition to 2 vessel coronary artery disease. Please note that although the presence of coronary artery calcium documents the presence of coronary artery disease, the severity of this disease and any potential stenosis cannot be assessed on this non-gated CT examination. Assessment for potential risk factor modification, dietary therapy or pharmacologic therapy may be warranted, if clinically indicated.   Aortic Atherosclerosis (ICD10-I70.0).     Electronically Signed   By: Trudie Reed M.D.   On: 02/14/2021 06:17 stable January 2023  PFT       Latest Reference Range & Units 01/27/21  15:53 01/27/21 15:54  Anti Nuclear Antibody (ANA) NEGATIVE   NEGATIVE  Angiotensin-Converting Enzyme 9 - 67 U/L  49  Anti JO-1 0.0 - 0.9 AI  <0.2  Cyclic Citrullin Peptide Ab UNITS  <16  ds DNA Ab IU/mL  1  ENA RNP Ab 0.0 - 0.9 AI  0.4  RA Latex Turbid. <14 IU/mL  323 (H)  SSA (Ro) (ENA) Antibody, IgG <1.0 NEG AI  <1.0 NEG  SSB (La) (ENA) Antibody, IgG <1.0 NEG AI  <1.0 NEG  Scleroderma (Scl-70) (ENA) Antibody, IgG <1.0 NEG AI  <1.0 NEG  QUANTIFERON-TB GOLD PLUS   Rpt  A.Fumigatus #1 Abs Negative  Negative   Micropolyspora faeni, IgG Negative  Negative   Thermoactinomyces vulgaris, IgG Negative  Negative   A. Pullulans Abs Negative  Negative   Thermoact. Saccharii Negative  Negative   Pigeon Serum Abs Negative  Negative   (H): Data is abnormally high     OV 03/23/2021- acute video visit due to concerns for acute symptoms ? Ofev side effect  Subjective:  Patient ID: Dustin Dean, male , DOB: 12-Apr-1950 , age 42 y.o. , MRN: [id number] , ADDRESS: [address] [place] [address] PCP Dustin Canterbury, MD Patient Care Team: Dustin Canterbury, MD as PCP - General (Family Medicine)  This Provider for this visit: Treatment Team:  Attending Provider: Kalman Shan, MD  Type of visit: Video Circumstance: COVID-19 national emergency Identification of patient Vashaun Minga with Mar 01, 1950 and MRN [id number] - 2 person identifier Risks: Risks, benefits, limitations of telephone visit explained. Patient understood and verbalized agreement to proceed Anyone else on call: just patient Patient location: Inpatient bed at [facility] which is another health system This provider location: [address] pulmonary office [address]  [place], [place] [address].   03/23/2021 -in this video visit was set up on  acute basis because he started having symptoms after starting nintedanib.  He wanted to know if it was nintedanib symptoms.  The symptoms sound rather complex and little bit  out of proportion for nintedanib.  Therefore I scheduled this video visit and to my surprise he is sitting in a hospital bed at [facility] hospital.    HPI Dustin Dean 72 y.o. - started ofev mid-feb 2023. Started noticing feeling disoriented and at times weak. Then on 3/4-03/19/21 was feeling very weak whole bidy weakness and needed walker to even go across room. Tired. Stopped going to gym. Was using o2 all the time. Then on 03/21/21 feelign cold. ANd ahd fever  102F.  Does not remember Tuesday much. Initialyt Dx is pneumonia and testing/imaging -> by tthen pccm saw him Dr Eual Fines. Procal negative. WBC normal. Concerns is ILD flare up and recommendation is prednisone.  CT scan did not show pneumonia Curently on IV solumedrol. Currently some better. Yesterday ate well. Currently on 2.5LN . He has been covid negative. Multiplex resp virus panel negative per outside chart revie  With ofev was having some nausea. No abd pain. No diarrhea. No stomach cramps. No vomitting   Currently ofev on hold    03/28/2021 Follow up : ILD , Post hospital follow up  Patient returns for a 1 week follow-up visit.  Patient was seen in January 2023 for second opinion for interstitial lung disease.  Patient was felt to have possible concern for hypersensitivity pneumonitis.  He has had a progressive phenotype noted on CT scan from August 2020 to October 2022.  Serology was negative except for a strongly positive rheumatoid factor.  He was referred to rheumatology.  PFT showed significant restriction and reduced DLCO.  Has a history of recurrent pneumonia.  He was recommended to begin antifibrotic's. With OFEV.  Patient was hospitalized last week with severe dyspnea, weakness, activity intolerance to point he could not walk. Confusion, low oxygen levels, and fever-tmax 102. Dustin Dean he has a viral illness and ILD flare.  Patient was hospitalized at an outlying hospital.  He was treated with steroids.  Discharged on a  steroid taper. CT chest 03/22/21 report showed fibrotic ILD, stable since 10/2020, no acute consolidation.  Covid , Influezna  and Viral panel were neg.  Since getting out of the hospital patient is feeling much better. Energy level has picked, decreased oxygen demands, no fever. Appetite is some better . Patient is concerned this could have been from [facility] as his symptoms started when he started OFEV.  Is on Oxygen with activity and At bedtime  2l/m . At rest O2 sats are >90%.      MDD 03/28/21 MDD Impression/Recs: Recent flare up. Bx can be prohibitive. get rheum eval and if neg - manage as HP. Maybe at the most BAL if stable  OV 04/05/2021  Subjective:  Patient ID: Dustin Dean, male , DOB: 19-Feb-1950 , age 17 y.o. , MRN: [id number] , ADDRESS: [address] [place] [address] PCP Dustin Canterbury, MD Patient Care Team: Dustin Canterbury, MD as PCP - General (Family Medicine)  This Provider for this visit: Treatment Team:  Attending Provider: Kalman Shan, MD    04/05/2021 -   Chief Complaint  Patient presents with   Follow-up    Pt states he is feeling better after last hospital stay and states each day is getting better.   Chronic cough on gabapentin  Interstitial lung disease work-up in progress -concern for hypersensitive pneumonitis  -  Progressive phenotype between August 2020 and October 2022   - started ofev mid feb 2023 -> early march 2023 admitted for ILD flare  9 mm right lower lobe nodule seen in October 2022 for the first time: Stable January 2023  HPI Dustin Dean 72 y.o. -returns for followup. He is on 1.5 tab of pred curently . Next week is 1 tab pred and off. He is better. Feeling better. AT rest not using o2 because he is better. In fact symp score shows huge imrpovement. Walking desat test is also largey imprpvd. All ? Steroid effects. We had conversation about his recent flare up  - has hx of recurrent pna: 2005-2006 x 2 episodes. . Lived in a different  house. Then again pneumonia 5 years ago in same house. Rx in urgent care. Had high fever. Current started abruptly 2 weeks into ofev andhe wondering if due to ofev. Did not have classic Ofev sid effects. Mentioned to him Ofev reduces riks of flare.  - gardening: no longer gardening  - featherpillow - got rid of it even after last visit   -visible mold - denies but he discussed his crawl space. Says "there is no mold bu fungs on the top wall". 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 Biopsy indicated if stable but first rheum consult    04/26/2021- Interim hx  Patient presents today for ILD follow-up. He has been on OFEV for three weeks, currently taking 150mg  twice daily. He has been off oral prednisone for two weeks. Breathing is not significantly worse off steroids. Cough is the same. Since Saturday he developed chills, fatigue and low grade fever at night. Temp 100.1-100.3. He reprots some heavy breathing. Oxygen 92% or higher. Urinary frequency. Having some incontinence. Continues on Gabapentin for chronic cough. Referred to rheumatology, has an apt beginning of June. He has been avoid exposures mold/mildew, gardening.     05/02/2021 Follow up : ILD , Fever  Patient presents for an acute office visit.  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  Serial CT chest from August 2020 to August 2022 showed progressive changes.  Serology was negative except for a positive rheumatoid factor.  He has been referred to rheumatology with consult pending.  Pulmonary function testing showed significant restriction and decreased DLCO.  Patient started Ofev on March 01, 2021.  Patient was admitted early March with severe weakness dyspnea and activity intolerance.  He also had intermittent confusion hypoxemia and a fever Tmax of 102.  He was felt to have an ILD flare with possible  viral illness however COVID, influenza and viral panel were all negative.  He was treated with steroids.  CT chest showed fibrotic ILD stable since October 2022.  His Ofev was held during admission.  After discharge he said that his energy level was improved and had decreased oxygen demands.  Based on oxygen after discharge was oxygen 2 L with activity and at bedtime.  Not requiring oxygen at rest with O2 saturations greater than 90%.  Patient was restarted on Ofev April 05, 2020.  After 2 weeks of been on Ofev patient developed low-grade fever.  This is waxed and waned and yesterday developed a fever Tmax of 103.  Patient went to the emergency room work-up was unrevealing.  CT chest showed stable ILD with no acute process.  CBC was normal blood cultures are no  growth to date.  Respiratory viral panel was negative.  Lactic acid was 1.5 urinalysis was normal, urine culture is pending.  LFTs were slightly elevated.  Since last night patient says he is feeling better. Fever is resolved. Took Tylenol . O2 saturations are 100%.  Has some sore throat that started late night .  Had good appetite today . No fever today . No increased Oxygen demands.  Wife is [name].  Patient denies any rash, skin lesions, skin redness, joint swelling, insect or tick bite, travel, discolored mucus, abdominal pain, nausea vomiting or diarrhea. Has no previous joint replacement or surgeries. Abdominal hernia repair >53yrs ago. No abd pain,  Followed by cardiology at [facility].  2D echo January 25, 2018 3 aortic valve is thickened with mild to moderate aortic valve stenosis.,  EF 55 to 60%.  Right ventricle is normal systolic function is normal.     OV 05/19/2021  Subjective:  Patient ID: Dustin Dean, male , DOB: 1950-08-25 , age 89 y.o. , MRN: [id number] , ADDRESS: [address] [place] [address] PCP Dustin Canterbury, MD Patient Care Team: Dustin Canterbury, MD as PCP - General (Family Medicine)  This Provider for this visit:  Treatment Team:  Attending Provider: Kalman Shan, MD    05/19/2021 -   Chief Complaint  Patient presents with   Follow-up    PFT performed today.  Pt states since stopping the OFEV, he has not had anymore fever and states overall he has felt good.    Chronic cough on gabapentin  - well controlled  Interstitial lung disease work-up in progress -concern for hypersensitive pneumonitis  -Progressive phenotype between August 2020 and October 2022   - started ofev mid feb 2023 -> early march 2023 admitted for ILD flare ->   9 mm right lower lobe nodule seen in October 2022 for the first time: Stable January 2023  HPI Dustin Dean 72 y.o. -returns with wife [name].  He tells me that after last visit he did a rechallenge with nintedanib and he picked up fever and ended up in the emergency room very similar to March 2023 hospitalization.  Symptoms started 1-2 weeks into taking nintedanib.  His wife feels strongly this is nintedanib related.  Have not seen the patient gets significant side effects from nintedanib but have seen one of the patient gets something similar with pirfenidone.  That particular patient had even after 1 dose.  I think the strength of evidence here is that this systemic inflammatory response is probably related to nintedanib.  Therefore he has stopped it.  I have advised him against taking nintedanib ever again.  Clinically he is feeling stable.  His walking desaturation test is stable.  His pulmonary function test is also stable.  He uses 2 L with exertion but today in office when we walked him on room air he dropped 7 points and this is similar to before.  He wants to try pirfenidone.  We have discussed this in the past.  We also discussed prednisone but he does not want to do prednisone because of hyperglycemia.  We discussed the risk, benefits and limitations of pirfenidone.  Pirfenidone is not well studied in chronic HP.  Is only studied in 1 subtype of non-IPF  progressive phenotype.  Nevertheless is the only other antifibrotic option available.  He wants to try a donor sample.  We will give him 2 months worth.  He is traveling to the beach  once he comes back he will  take it    07/03/2021 Follow up : ILD  Patient returns for a 6-week follow-up.  Patient has underlying interstitial lung disease with progressive phenotype.  Serial CT chest from August 20 to August 2022 showed progressive changes.  Serology was negative except for positive rheumatoid factor.  Rheumatology consult is pending.  Previous PFTs have showed significant restriction and decreased diffusing capacity.  Patient was started on Ofev March 01, 2021.  Patient had a SIRS like response to OFEV x 2 .  Ofev has been stopped indefinitely and advised not to restart.  Patient was placed on a drug holiday.  Started on Esbriet 1 month ago.  Patient says since starting Esbriet he seems to be tolerating very well.  LFTs prior to start had returned back to normal.  Previously were elevated.  Patient says he has had minimal nausea.  No diarrhea no upset stomach and appetite seems to be normal.  Patient says he has been using sunscreen when he is out side.  Has had no rash.  We discussed returning for ongoing labs. Patient has been referred to pulmonary rehab but has not contacted today. Patient says overall he is doing okay.  Wears his oxygen with activity as needed.  And at bedtime.  Has had no increased oxygen demands. Patient says he still gets short of breath with heavy activities.  But feels like he is stable at this point.  Has had no return of fever.      OV 09/08/2021  Subjective:  Patient ID: Dustin Dean, male , DOB: March 26, 1950 , age 44 y.o. , MRN: [id number] , ADDRESS: [address] [place] [address] PCP Dustin Canterbury, MD Patient Care Team: Dustin Canterbury, MD as PCP - General (Family Medicine)  This Provider for this visit: Treatment Team:  Attending Provider: Kalman Shan, MD    09/08/2021 -   Chief Complaint  Patient presents with   Follow-up    Pt states he has been doing okay since last visit and denies any complaints.      HPI Dustin Dean 72 y.o. -returns for follow-up.  He tells me that he is lost more weight.  He is stable.  His symptom score shows good stability.  He is tolerating pirfenidone really well.  He has lost weight.  Current BMI is 31 but it is all intentional with the help of exercise, pirfenidone and also eating less calories.  No side effects from pirfenidone no sunburn nothing.  Is taking 3 to 2 pills 3 times daily.  He is willing to roll over to 1 big pill 3 times daily.  His main questions were -He wants to do some gardening such as cut wine but he assures me that will not be any exposure to organic dust.  -He wants to donate his body after death to medical school.  I advised him that either [facility] or [facility] is fine.  -He also has aortic stenosis.  He saw cardiology today at [facility].  He saw Dr. Dyane Dustman.  He is being referred to surgery at [facility] for TAVR consideration.  According to the discussion [not clearly apparent in the chart review] TAVR is being recommended early because of his pulmonary fibrosis.  I did explain to him that in advance pulmonary fibrosis surgical risk is high but did want him to ask the surgeon if there is any downside to operating or placing TAVR procedure when the aortic stenosis is not critical.  PFT   OV 11/17/2021  Subjective:  Patient ID: Dustin Dean, male , DOB: 1950/04/11 , age 77 y.o. , MRN: [id number] , ADDRESS: [address] [place] [address] PCP Dustin Canterbury, MD Patient Care Team: Dustin Canterbury, MD as PCP - General (Family Medicine)  This Provider for this visit: Treatment Team:  Attending Provider: Kalman Shan, MD    11/17/2021 -   Chief Complaint  Patient presents with   Follow-up    PFT performed today.  Pt  states he has multiple concerns to discuss.     HPI Dustin Dean 72 y.o. -returns for follow-up.  He is doing well overall.  However for the last 3 days he has had a runny nose and with that he is having decreased energy.  He is got decreased social interest.  He also feels his memory is failing him more in the last 3 days he has noted some memory changes for the last several months but it is a lot worse in the last 3 days since the runny nose.  His COVID is negative but there is no fever brown sputum or yellow sputum.  There is no significant drainage.  There is no worsening shortness of breath there is no worsening cough there is no chills.  In addition he has new complaint of tender itchy scalp for the last 3 weeks there is no rash but he has an appointment with dermatologist because his scalp is dry and flaky.  He is not doing gardening but he does go out occasionally in the sun and he does not wear sunscreen or hat.  He is on pirfenidone which can cause skin rash and especially in the setting of sun exposure.  I did caution him this could be because of pirfenidone but he is going to see a dermatologist anyways.  Outside of this he is tolerating his pirfenidone well.  I did indicate to him he can roll himself into 1 big pill 3 times daily instead of 3 small pills 3 times daily for a total of 9 pills.  He also told me that he is at risk for depression.  He says in the past he has had significant depression he is on antidepressant.  He is worried this can come back with his ILD.  We talked about seeing Georgiann Mohs clinical psychologist who specializes in medical issues he is willing to see him.  Also discussed about doing light therapy  Preoperative evaluation: He has aortic stenosis.  He is saying now that it could be critical.  Apparently TAVR procedure is being planned date unknown.  Currently because of the cold he has suspended pulmonary rehabilitation he wants to know if he can go back.  I did  tell him we need to get clearance from the cardiologist because of critical aortic stenosis history I thought it was moderate in the past.          OV 02/26/2022  Subjective:  Patient ID: Dustin Dean, male , DOB: August 04, 1950 , age 72 y.o. , MRN: [id number] , ADDRESS: [address] [place] [address] PCP Dustin Canterbury, MD Patient Care Team: Dustin Canterbury, MD as PCP - General (Family Medicine)  This Provider for this visit: Treatment Team:  Attending Provider: Kalman Shan, MD    HPI Dustin Dean 72 y.o. -presents for follow-up.  Presents with his wife.  His wife is an independent historian today.  Since I last saw him in November 2023 several issues have happened.  1 in mid November 2023 had sinus congestion and he was given doxycycline and prednisone.  Then in mid December 2023 he had TAVR procedure for his aortic stenosis.  He says this went really well and his murmur has now resolved.  Then straight after Christmas 2023 he had COVID-19.  WE called in molnupiravir.  He is tolerating his pirfenidone at 801 mg pill 3 times daily without any problems.  He has lost some weight intentionally.  He does admit that after all this he is slightly more short of breath than before..  In fact when he walked him he started desaturating.  This is a new finding.  He had pulmonary function test today that shows significant decline in his DLCO.  This was done today and is captured below.  His most recent hemoglobin on 02/20/2022 was reviewed done at [facility].  Was 13.6 g% and normal.  We discussed the possible need of right heart catheterization.  When he was in the office he did not recall that he actually had a right heart catheterization.  After he left the office when I reviewed the records I noticed that in October 2023 he did have heart catheterization.  At that time he did have a right heart catheterization before the TAVR.  In this the pulmonary capillary wedge pressure was 11 mmHg,  pulmonary artery mean pressure was 27 mmHg and elevated, Fick cardiac index was 3.3 mL.  It was in this catheterization that they saw that he had severe aortic valve stenosis with aortic valve area of 1.05 cm and a valve gradient of 40 mmHg.  He did have follow-up echocardiogram on 02/08/2022.  His left ventricular ejection fraction is 55-60%.  Right ventricle is normal.  There is TAVR bioprosthetic valve.  No pericardial effusion.     Last Weight  Most recent update: 02/26/2022 10:35 AM    Weight  93.4 kg (205 lb 12.8 oz)              Modified Six Minute Walk - 02/26/22 1000     Type of O2 used  O2 3/L   POC pulsed oxygen   Number of laps completed  3    Lap Pace Moderate    Resting Heartrate 83 bpm    Final Heartrate 113 bpm    Resting Pulse Ox 94 %   room air. Patient feels he did not need his POC on at this time.   Desaturated to <= 3 points Yes    Desaturated to < 88% Yes    Distance walked when desaturation occurred  240 feet   patient completed one lap.   Became tachycardic Yes    Symptoms  SOB after one lap on room air.  Applied 3 lpm POC.  SaO2 back to 95%.  patient was able to complete remaining 2 laps - 3 laps total.    Was the O2 correction test done? Yes    Amount of O2 needed to correct destauration 3 L    Distance walked without destat below 88%  480 feet   patient walked remaining 2 laps on 3 lpm POC pulsed oxygen   comments patient desat after 1 lap to 88% room air.  POC oxygen applied  at 3 lpm pulsed.  patient completed remaining 2 laps SaO2 95%.            OV 04/08/2022  Subjective:  Patient ID: Dustin Dean, male , DOB: Jun 02, 1950 , age 26 y.o. , MRN: [id number] ,  ADDRESS: [address] [place] [address] PCP Dustin Canterbury, MD Patient Care Team: Dustin Canterbury, MD as PCP - General (Family Medicine)  This Provider for this visit: Treatment Team:  Attending Provider: Kalman Shan, MD   02/26/2022 -   Chief Complaint  Patient presents  with   Follow-up    COVID end of December 2023.  Aortic valve replacement (TAVR)  01/02/22.  Cough persistent.  Runny nose improved after seeing ENT.    Latest Reference Range & Units 02/17/16 19:20 04/26/21 15:31 05/01/21 19:55 12/20/21 12:34 01/09/22 23:37  Eosinophils Absolute 0.0 - 0.5 K/uL 0.4 0.3 0.2 0.6 0.4    Anxiety and depression  -Seeing Jeris Penta psychology  04/08/2022 -   Chief Complaint  Patient presents with   Follow-up    Pt is here for follow up. Pt states no adverse effects noted so far for the Esbriet.      HPI Dustin Dean 72 y.o. - returns for followup. Still on esbriet. Symotoms stable. Was suppsoed to have had RHC but on day of procedure DrSam Turner had multiple other patient emergencies. So it is rescheduled. Date pending.  Here to review PFT and CT Due to concern for progression. CT is definitel progressed from a year ago - despite esbriet.  PFT worse since a year ago but since covidd ? Rebounding back. We discuseed disease progression . Eplained next step is to try immune modulator. 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 We took shared decision to awaith RHC. Reommended he hold off clinical trial at this point. Cough still + but says is better with gabapentin. He does have high eos and we will discuss this next visit.  Obesity: losing weight  Wife also present and was independent historian     OV 05/22/2022  Subjective:  Patient ID: Dustin Dean, male , DOB: August 29, 1950 , age 52 y.o. , MRN: [id number] , ADDRESS: [address] [place] [address] PCP Dustin Canterbury, MD Patient Care Team: Dustin Canterbury, MD as PCP - General (Family Medicine)  This Provider for this visit: Treatment Team:  Attending Provider: Kalman Shan, MD -   05/22/2022 -   Chief Complaint  Patient presents with   Follow-up    F/up on PFT      HPI Dustin Dean 72 y.o. -returns for follow-up for his above issues.  Since his last visit he had his right heart catheterization.  It confirmed pulmonary hypertension present even after his TAVR procedure.  We ordered treprostinil.  He got the package last week and is going to start it tonight after the [place] therapeutics agent visits with him and sets up the package for him.  He is looking forward to starting it.  Currently tolerating pirfenidone well.  He is going through cardiac rehab.  He states his stamina is improved his weight has come down.  His shortness of breath is better.  All reflected below.  Even the cough is better on Neurontin.  He is now seeing counselor Serafina Mitchell and is feeling better.  He had pulmonary function test and it shows continued recent stability.  This is also described below.  He continues to use portable oxygen.  Recently went to [facility] and did some unloading and he did not need oxygen.  Today made him do a sit/stand test x 10 times.  He did not  desaturate below 93%.  OV 06/19/22  66-year-old male followed for interstitial lung disease with progressive phenotype (diagnosed in 2019) Seen for pulmonary consult to establish for ILD in January 2023.  Has chronic respiratory failure on oxygen History of prostate cancer.  History of aortic valve stenosis followed by cardiology at [facility] video visit is a 1 month follow-up for pulmonary hypertension related ILD patient says since last visit he is doing better.  Has completed pulmonary rehab.  And now is beginning a new exercise class at local gym.  Patient remains on Esbriet.  He says he has no issues.  Appetite is good with no nausea vomiting or diarrhea.  He was recently started on Tyvaso. Patient is doing well. Completing pulmonary rehab.  Going to a new exercise class Tolerating Tyvaso well.  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  Remains on oxygen  2 L with activity and at bedtime.  OV 08/21/2022  Subjective:  Patient ID: Dustin Dean, male , DOB: December 05, 1950 , age 57 y.o. , MRN: [id number] , ADDRESS: [address] [place] [address] PCP Dustin Canterbury, MD Patient Care Team: Dustin Canterbury, MD as PCP - General (Family Medicine)  This Provider for this visit: Treatment Team:  Attending Provider: Kalman Shan, MD    Chronic cough on gabapentin  - well controlled  Interstitial lung disease high prob for hypersensitive pneumonitis  -Progressive phenotype between August 2020 and October 2022   - started ofev mid feb 2023 -> early march 2023 admitted for ILD flare -> rechallenged ofev April 2023 -> ER vist SIRS   - SAE with OFev/ Ofev stopped  - started Pirfenioned May 2023 (rjected prednisone due to side effec tprofile)  - transplaint discussion marrch 2-24 - not interested  9 mm right lower lobe nodule seen in October 2022 for the first time: Stable January 2023 repoted as fibrosis ->Not reported n feb 2024   8 mm lateral subpleural left lower lobe nodule (5/93),-> unchanged from 08/30/2017.-> through feb 2024  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  Respiratory exacerbations/infections  -Mid November 2023: Sinus congestion and given doxycycline and prednisone  -01/09/2022: COVID-19 and given molnupiravir E  RAST allergy panel December 2023 -Mildly positive for cat and dog dander.  Otherwise negative.  Normal IgE'. ELevaed Eos  High risk med  - Esbriet/Pirfenidone requires intensive drug monitoring due to high concerns for Adverse effects of , including  Drug Induced Liver Injury, significant GI side effects that include but not limited to Diarrhea, Nausea, Vomiting,  and other system side effects that include  Fatigue, headaches, weight loss and other side effects such as skin rash. These will be monitored with  blood work such as LFT initially once a month for 6 months and then quarterly   08/21/2022 -   Chief Complaint  Patient presents with   Follow-up    Currently in hosp. wants to discuss treatment.    Type of visit: Video Virtual Visit Identification of patient Dustin Dean with September 30, 1950 and MRN [id number] - 2 person identifier Risks: Risks, benefits, limitations of telephone visit explained. Patient understood and verbalized agreement to proceed Anyone else on call: just patient Patient  location: [facility] inpatient status - [facility] This provider location: [address]; [place]; [place] [address]. [facility]. [phone]     HPI Dustin Yurek 72 y.o. -on this video visit as a follow-up video visit because he is now an inpatient at [facility] which is not part of [facility].  He converted the onsite visit to a video visit.  He told me 08/16/2022 and confirmed on review of the external medical records he got admitted with sudden desaturations.  He has been treated with steroids he was requiring 6 L.  He has somewhat improved.  He wants to get discharged but he says when they walked him with 3 L nasal cannula he desaturated. Labs at [facility] creat normal. CTA angio - negative for PE. Says RVP negaive. He feels he is getting better. He wants to go home. I d/w  over phone with Dr Oliver Pila of ccm there. Not on his rounding list but will review with primary/. Care everywhere does not indicate echo/bnp - indicated for this to be done + Consider HRCT + PCCM consult there with Dr Earlie Server     SYMPTOM SCALE - ILD 01/24/2021 02/23/2021   04/05/2021 Post hopsilaization. On pred taper and off ofev Ofev 150 twice daily; No prednsone  05/19/2021 214# 11/17/2021  02/26/2022 205# 04/08/2022 199# 05/22/2022 199# - cardica reab  Current weight          esbiret esbriet esbiret Esbietr - going to start tyvaso tonight  O2 use 2L [place] with ex 2L with ex 2L Wth ex at hoe 2L       Shortness of Breath 0 -> 5 scale with 5 being worst (score 6 If unable to do)            At rest 0 0 0 1 0 0 0 0 0  Simple tasks - showers, clothes change, eating, shaving 1 1 0.5 2 1 1  0 0 0  Household (dishes, doing bed, laundry) 1 1 1 2 2 1 1 1 1   Shopping 2 1 0 NA 1 1 1 1 1   Walking level at own pace 5 2 0 1 (with oxygen) 1 2 2 1  0  Walking up Stairs 5 3 1  with o2, 6 without o2 2 (with oxygen) 2 3 3 2 2   Total (30-36) Dyspnea Score 14 8   8 7 8 7 5  with o2 4 with o2  How bad is your cough? 2 3 0 with gbapentin 1-2 (on gabapentin) 2 3 2 3 1   How bad is your fatigue 1 2 0 4 1 4  0 0 0  How bad is nausea 00000 0 0 0 0 0 0 0 0  How bad is vomiting?   0 0 0 0 0 0 0 0 0  How bad is diarrhea? 0 0 0 0 0 0 0 0 0  How bad is anxiety? 0 0 0 0 0 2 0 1 3  How bad is depression 0 0 0 0 (on antidepressant) 1 2 0 1 3  Any chronic pain - if so where and how bad 0 0 0 0 0  1 x x     Simple office walk 185 feet x  3 laps goal with forehead probe 01/24/2021  04/05/2021 On prendisone. Off ofev 05/19/2021 Off ofev 09/08/2021 On esbiret since may 2023 02/26/2022 Esbiret since may 2023 04/06/22 05/22/2022 Full dose Esbriet.  Going to start treprostinil  O2  used Ra at rest, desat at 1 lap and then walked with 2L piulse Ra - being on room air for 15 min and in mddile of pred burst taper   ra ra Room air  Number laps completed 1 lap on RA, then 3 laps on 2L Did all 3 laps   1 of 3 laps - forehead probe 3 of 3 laps Sit/stand x 10 times  Comments about pace x avg  avg av    Resting Pulse Ox/HR 96% and 88/min at 1 lap 98% an Hr 92 98% AND HR 81 98% and HR 77 94% 94% and HR 82 98 send and heart rate 81  Final Pulse Ox/HR 92% and 115/min on 3 laps on 2L pulsed 92% and HR 117 91% and HR 115 92% and HR 113 88% 88% and HR 109 93% and heart rate 102  Desaturated </= 88% no no no no      Desaturated <= 3% points yes Yes, 6  Yes 7  Yes 6 pots     Got Tachycardic >/= 90/min yes yes yes avg     Symptoms at end of test Modreate dyspnea Modate dyspnea Mild dyspnea Mild dyspnea   Level 2 out of 10 dyspnea  Miscellaneous comments Corrected with 2L [place] Did not need o2, improved         PFT     Latest Ref Rng & Units 08/14/2022   10:53 AM 05/22/2022    9:53 AM 04/04/2022    8:31 AM 02/26/2022    9:56 AM 11/17/2021    1:02 PM 05/19/2021   10:56 AM 02/21/2021    4:04 PM  ILD indicators  FVC-Pre L 1.88  P 2.02  1.98  2.02  1.94  1.95  1.82   FVC-Predicted Pre % 45  P 48  51  52  46  46  43   FVC-Post L   2.18     1.95   FVC-Predicted Post %   57     46   TLC L   3.57     3.76   TLC Predicted %   57     56   DLCO uncorrected ml/min/mmHg 15.13  P 14.22  16.38  12.33  16.63  19.56  15.57   DLCO UNC %Pred % 62  P 57  71  53  67  79  63   DLCO Corrected ml/min/mmHg 15.13  P 14.38  16.38  12.62  16.87  19.56  15.57   DLCO COR %Pred % 62  P 58  71  54  68  79  63     P Preliminary result      LAB RESULTS last 96 hours No results found.  LAB RESULTS last 90 days Recent Results (from the past 2160 hour(s))  Pulmonary function test     Status: None (Preliminary result)   Collection Time: 08/14/22 10:53 AM  Result Value Ref Range   FVC-Pre 1.88 L   FVC-%Pred-Pre 45 %   FEV1-Pre 1.62 L   FEV1-%Pred-Pre 53 %   FEV6-Pre 1.87 L   FEV6-%Pred-Pre 48 %   Pre FEV1/FVC ratio 86 %   FEV1FVC-%Pred-Pre 116 %   Pre FEV6/FVC Ratio 100 %   FEV6FVC-%Pred-Pre 106 %   FEF 25-75 Pre 1.72 L/sec   FEF2575-%Pred-Pre 75 %   DLCO unc 15.13 ml/min/mmHg   DLCO unc % pred 62 %   DLCO cor 15.13 ml/min/mmHg   DLCO cor % pred 62 %  DL/VA 1.61 ml/min/mmHg/L   DL/VA % pred 096 %         has a past medical history of Aortic stenosis, Flu, Pneumonia, Prostate cancer (HCC), Pulmonary fibrosis (HCC), and Skin cancer.   reports that he has never smoked. He has been exposed to tobacco smoke. He has  never used smokeless tobacco.  Past Surgical History:  Procedure Laterality Date   HERNIA REPAIR     NASAL SEPTUM SURGERY     SKIN CANCER EXCISION      Allergies  Allergen Reactions   Bee Venom Rash   Shellfish Allergy Hives   Nintedanib Other (See Comments)    SIRS, fevers   Pseudoephedrine Anxiety    Immunization History  Administered Date(s) Administered   COVID-19, mRNA, vaccine(Comirnaty)12 years and older 10/19/2021   Fluad Quad(high Dose 65+) 10/23/2017, 09/30/2019, 10/14/2020, 10/18/2020   Influenza-Unspecified 11/11/2009, 11/08/2011, 11/03/2013, 11/22/2014, 11/10/2015   Moderna Covid-19 Vaccine Bivalent Booster 25yrs & up 10/24/2020   Moderna Sars-Covid-2 Vaccination 03/19/2019, 04/16/2019, 11/16/2019, 05/07/2020   Pneumococcal Conjugate-13 12/08/2007, 12/21/2015, 09/04/2018   Pneumococcal Polysaccharide-23 12/08/2007, 06/21/2017, 06/16/2018   Tdap 12/08/2007, 11/27/2018   Zoster Recombinant(Shingrix) 10/30/2013, 09/04/2018, 11/27/2018   Zoster, Live 10/30/2013   Zoster, Unspecified 10/30/2013    Family History  Problem Relation Age of Onset   Cancer Mother    Congestive Heart Failure Father    Prostate cancer Father    Healthy Sister      Current Outpatient Medications:    acetaminophen (TYLENOL) 325 MG tablet, Take by mouth as needed., Disp: , Rfl:    aspirin-acetaminophen-caffeine (EXCEDRIN MIGRAINE) 250-250-65 MG tablet, Take by mouth as needed., Disp: , Rfl:    Calcium Carb-Cholecalciferol (CALCIUM/VITAMIN D PO), Take 1 tablet by mouth daily., Disp: , Rfl:    Cetirizine HCl (ZYRTEC PO), Take by mouth., Disp: , Rfl:    ESBRIET 801 MG TABS, Take 1 tablet (801 mg total) by mouth 3 (three) times daily with meals., Disp: 270 tablet, Rfl: 1   fluorouracil (EFUDEX) 5 % cream, Apply topically., Disp: , Rfl:    fluticasone (FLONASE) 50 MCG/ACT nasal spray, Place into both nostrils daily., Disp: , Rfl:    gabapentin (NEURONTIN) 300 MG capsule, Take 300 mg by  mouth 3 (three) times daily., Disp: , Rfl:    ipratropium (ATROVENT) 0.06 % nasal spray, Place 2 sprays into both nostrils 3 (three) times daily., Disp: , Rfl:    loperamide (IMODIUM) 2 MG capsule, Take 1 mg by mouth once., Disp: , Rfl:    meclizine (ANTIVERT) 25 MG tablet, Take 25 mg by mouth 2 (two) times daily as needed., Disp: , Rfl:    Multiple Vitamin (MULTIVITAMIN) capsule, Take 1 capsule by mouth every morning., Disp: , Rfl:    naproxen sodium (ALEVE) 220 MG tablet, Take 220 mg by mouth daily as needed., Disp: , Rfl:    omeprazole (PRILOSEC) 40 MG capsule, Take 40 mg by mouth daily., Disp: , Rfl:    PAROXETINE HCL PO, Take by mouth., Disp: , Rfl:    rosuvastatin (CRESTOR) 5 MG tablet, Take 1 tablet by mouth daily., Disp: , Rfl:    SUMAtriptan (IMITREX) 100 MG tablet, TAKE ONE TABLET BY MOUTH AT ONSET OF HEADACHE; MAY REPEAT ONE TABLET IN 2 HOURS IF NEEDED. MAX OF 200 MG PER DAY, Disp: , Rfl:    Treprostinil (TYVASO) 0.6 MG/ML SOLN, Inhale into the lungs 4 (four) times daily. 10 breaths, increases to 12 breaths on Wednesday., Disp: , Rfl:  Objective:   Vitals:   08/21/22 0955  Weight: 196 lb (88.9 kg)    Estimated body mass index is 29.8 kg/m as calculated from the following:   Height as of 05/22/22: 5\' 8"  (1.727 m).   Weight as of this encounter: 196 lb (88.9 kg).  @WEIGHTCHANGE @  [employer]   08/21/22 0955  Weight: 196 lb (88.9 kg)     Physical Exam   General: No distress. In patien bed. 94% on O2 O2 at rest: yes Cane present: no Sitting in wheel chair: no but sitting in bed Frail: no Obese: no Neuro: Alert and Oriented x 3. GCS 15. Speech normal Psych: Pleasant       Assessment:       ICD-10-CM   1. Chronic respiratory failure with hypoxia (HCC)  J96.11     2. ILD (interstitial lung disease) (HCC)  J84.9     3. Pulmonary hypertension (HCC)  I27.20          Plan:     Patient Instructions     ICD-10-CM   1. Chronic respiratory failure  with hypoxia (HCC)  J96.11     2. ILD (interstitial lung disease) (HCC)  J84.9     3. Pulmonary hypertension (HCC)  I27.20       Plan  - inpatient mgmt per [facility] - continue esbrit and tyvaso  Followup  - < 2 weeks with APP here for face to face   FOLLOWUP Return in about 1 week (around 08/28/2022) for 30 min visit, ILD, with any of the APPS.    SIGNATURE    Dr. Kalman Dean, M.D., F.C.C.P,  Pulmonary and Critical Care Medicine Staff Physician, [facility] Director - Interstitial Lung Disease  Program  Pulmonary Fibrosis [employer] [facility] at [facility] [place], [place], [address]  Pager: [phone], If no answer or between  15:00h - 7:00h: call [phone] Telephone: [phone]  10:38 AM 08/21/2022  HIGh Complexity  OFFICE   2021 E/M guidelines, first released in 2021, with minor revisions added in 2023. Must meet the requirements for 2 out of 3 dimensions to qualify.    Number and complexity of problems addressed Amount and/or complexity of data reviewed Risk of complications and/or morbidity  Severe exacerbation of chronic illness  Acute or chronic illnesses that may pose a threat to life or bodily function, e.g., multiple trauma, acute MI, pulmonary embolus, severe respiratory distress, progressive rheumatoid arthritis, psychiatric illness with potential threat to self or others, peritonitis, acute renal failure, abrupt change in neurological status Must meet the requirements for 2 of 3 of the categories)  Category 1: Tests and documents, historian  Any combination of 3 of the following:  Assessment requiring an independent historian  Review of prior external note(s) from each unique source  Review of results of each unique test - CT , blood work  Ordering of each unique test    Category 2: Interpretation of tests    Independent interpretation of a test performed by another physician/other qualified  health care professional (not separately reported)  Category 3: Discuss management/tests  Discussion of management or test interpretation with external physician/other qualified health care professional/appropriate source (not separately reported)  HIGH risk of morbidity from additional diagnostic testing or treatment Examples only:  Drug therapy requiring intensive monitoring for toxicity  Decision for elective major surgery with identified pateint or procedure risk factors  Decision regarding hospitalization or escalation of level  of care  Decision for DNR or to de-escalate care   Parenteral controlled  substances

## 2022-08-22 NOTE — Telephone Encounter (Signed)
Received the following message from patient:   "My current one at home is maximum 5 liters output. To get the next size up requires a request and paperwork from your office. Can you or someone on your staff please contact [employer] for it? Thank you."  MR, can you please advise if you are ok with [place] placing an order for him to get an oxygen concentrator that goes above 5L?

## 2022-08-22 NOTE — Telephone Encounter (Signed)
If he is still in the hospital the care management at [facility] should be able figure out and give documentation for his next level needs. He is at [employer]

## 2022-08-23 ENCOUNTER — Encounter: Payer: Self-pay | Admitting: Internal Medicine

## 2022-08-24 ENCOUNTER — Telehealth (HOSPITAL_BASED_OUTPATIENT_CLINIC_OR_DEPARTMENT_OTHER): Payer: Self-pay

## 2022-08-24 NOTE — Telephone Encounter (Signed)
Called and spoke w/ pt   regarding his appt and medication. A message has been sent to provide regarding his dosage for his prednisone and pt stated that he  will to APP  any location in 2 weeks  have sent a message to check which office an opening in  2 weeks  Informed pt that someone will be in contact regarding this.

## 2022-08-24 NOTE — Telephone Encounter (Signed)
Pt is calling in bc he is wanting to speak with a nurse for his next steps after the hospital. Pt is suppose to have a 2 week follow up but the first avail w. APP Or Ramaswamy isn't until 09/11. Pt feels it is super important to have his follow up appointment to know his next steps in his treatment plan, pls advise pt on Medication advice as well as Appt details.

## 2022-08-27 NOTE — Telephone Encounter (Signed)
What did the hospital send him on? I can address at followup .

## 2022-08-27 NOTE — Telephone Encounter (Signed)
Can see him 09/06/22 thu at 4.30pm in the office. Srtay on prdnisone 20mg  per day till t hen

## 2022-08-29 NOTE — Telephone Encounter (Signed)
Oxygen Orders for Discharge: Discharge Oxygen Orders (From admission, onward)    For Home Use Only DME Gaseous Oxygen (Arrange for Home Use Only DME Gaseous Oxygen) Once (Routine)  Question Answer Comment  O2 Delivery device: Nasal Cannula  O2 rate: 6 Lpm  Frequency: Continuous, stationary and portable  Oxygen Conserving Device Yes   Patient advised to stay on 20 mg prednisone until his FU appt. He has agreed and has appt with Lanora Manis 09/05/22.

## 2022-08-30 ENCOUNTER — Ambulatory Visit (INDEPENDENT_AMBULATORY_CARE_PROVIDER_SITE_OTHER): Payer: Medicare Other | Admitting: Behavioral Health

## 2022-08-30 ENCOUNTER — Encounter: Payer: Self-pay | Admitting: Behavioral Health

## 2022-08-30 DIAGNOSIS — R454 Irritability and anger: Secondary | ICD-10-CM | POA: Diagnosis not present

## 2022-08-30 DIAGNOSIS — F331 Major depressive disorder, recurrent, moderate: Secondary | ICD-10-CM

## 2022-08-30 DIAGNOSIS — F411 Generalized anxiety disorder: Secondary | ICD-10-CM

## 2022-08-30 NOTE — Progress Notes (Addendum)
[facility] Counselor Initial Adult Exam  Name: Dustin Dean Date: 08/30/2022 MRN: [id number] DOB: 1950/08/21 PCP: [name], MD  Time spent: 43 minutes, 9 AM until 9:43 AM.This session was held via video teletherapy. The patient consented to the video teletherapy and was located in his home during this session. He is aware it is the responsibility of the patient to secure confidentiality on his end of the session. The provider was in a private home office for the duration of this session.       Guardian/Payee: Self  Paperwork requested: Yes   Reason for Visit /Presenting Problem: Depression  The patient has been in the hospital since our last visit because he was having difficulty breathing.  They tested for everything and he was negative for anything COVID flu etc.  They are still not sure what created the drop in oxygen levels.  He has to at least for now where his oxygen all the time.  Previously it was just when he was out in public or when he was asleep but now he has to wear it around the house and that is frustrating for him.  He is doing physical therapy once a week and they are telling him they can get him back to where he was previous to this episode.  He is meeting with this pulmonologist next week and hopes that they can give him the same encouragement.  He had been learning to accept the fact that he had oxygen partly but having it all the time is something that he is not quite prepared for.  He says he is not giving up but describes that he is looking for an overall sense of purpose in his life.  We took a look at forgiveness and what that means for him and for those he felt like he needed to ask for forgiveness from him and also in terms of his faith belief system.  We talked about finding new ways to do things he enjoys.  He has reached out to someone who is going to give him an estimate of doing some lawn skating for him but we talked about how he could be  involved in that in terms of planning, choosing out what type things he wanted to put into his natural areas, watching etc.  [place] has a trip he and his wife enjoy.  He is looking forward to that being a time of rest and relaxation even if he cannot necessarily do some of the things he has done in the past.  As we went further between sessions last time he said the distance in time was fine and we will stay with that moving forward.  He has several appointments scheduled.  He does contract for safety having no thoughts of hurting himself or anyone else. Mental Status Exam: Appearance:   Fairly Groomed     Behavior:  Appropriate  Motor:  Normal  Speech/Language:   Clear and Coherent and Normal Rate  Affect:  Appropriate  Mood:  normal  Thought process:  normal  Thought content:    WNL  Sensory/Perceptual disturbances:    WNL  Orientation:  oriented to person, place, time/date, situation, and day of week  Attention:  Good  Concentration:  Good  Memory:  WNL  Fund of knowledge:   Good  Insight:    Good  Judgment:   Good  Impulse Control:  Good     Reported Symptoms: Depression  Risk Assessment: Danger to Self:  No Self-injurious Behavior: No Danger to Others: No Duty to Warn:no Physical Aggression / Violence:No  Access to Firearms a concern: No  Gang Involvement:No  Patient / guardian was educated about steps to take if suicide or homicide risk level increases between visits: n/a While future psychiatric events cannot be accurately predicted, the patient does not currently require acute inpatient psychiatric care and does not currently meet [facility] involuntary commitment criteria.  Substance Abuse History: Current substance abuse: No     Past Psychiatric History:   Previous psychological history is significant for depression Outpatient Providers: Primary care physician History of Psych  Hospitalization:  None reported Psychological Testing:  n/a    Abuse History:  Victim of: No.,  None reported    Report needed: No. Victim of Neglect:No. Perpetrator of n/a Witness / Exposure to Domestic Violence: None reported  Protective Services Involvement: No  Witness to [employer] Violence:  No   Family History:  Family History  Problem Relation Age of Onset   Cancer Mother    Congestive Heart Failure Father    Prostate cancer Father    Healthy Sister     Living situation: the patient lives with their spouse  Sexual Orientation: Straight  Relationship Status: married  Name of spouse / other: Did not discuss If a parent, number of children / ages: Adults  Support Systems: spouse  Financial Stress:  No   Income/Employment/Disability: Doctor, general practice: No   Educational History: Education: high school diploma/GED  Religion/Sprituality/World View: Did not discuss  Any cultural differences that may affect / interfere with treatment:  not applicable   Recreation/Hobbies: Time with family  Stressors: Health problems    Strengths: Supportive Relationships, Hopefulness, Self Advocate, and Able to Communicate Effectively  Barriers:     Legal History: Pending legal issue / charges: The patient has no significant history of legal issues. History of legal issue / charges:  Not applicable  Medical History/Surgical History: reviewed Past Medical History:  Diagnosis Date   Aortic stenosis    Flu    Pneumonia    Prostate cancer (HCC)    Pulmonary fibrosis (HCC)    Skin cancer     Past Surgical History:  Procedure Laterality Date   HERNIA REPAIR     NASAL SEPTUM SURGERY     SKIN CANCER EXCISION      Medications: Current Outpatient Medications  Medication Sig Dispense Refill   acetaminophen (TYLENOL) 325 MG tablet Take by mouth as needed.     aspirin-acetaminophen-caffeine (EXCEDRIN MIGRAINE) 250-250-65 MG tablet Take by  mouth as needed.     Calcium Carb-Cholecalciferol (CALCIUM/VITAMIN D PO) Take 1 tablet by mouth daily.     Cetirizine HCl (ZYRTEC PO) Take by mouth.     ESBRIET 801 MG TABS Take 1 tablet (801 mg total) by mouth 3 (three) times daily with meals. 270 tablet 1   fluorouracil (EFUDEX) 5 % cream Apply topically.     fluticasone (FLONASE) 50 MCG/ACT nasal spray Place into both nostrils daily.     gabapentin (NEURONTIN) 300 MG capsule Take 300 mg by mouth 3 (three) times daily.     ipratropium (ATROVENT) 0.06 % nasal spray Place 2 sprays into both nostrils 3 (three) times daily.     loperamide (IMODIUM) 2 MG capsule Take 1 mg by mouth once.     meclizine (ANTIVERT) 25 MG tablet Take 25 mg by  mouth 2 (two) times daily as needed.     Multiple Vitamin (MULTIVITAMIN) capsule Take 1 capsule by mouth every morning.     naproxen sodium (ALEVE) 220 MG tablet Take 220 mg by mouth daily as needed.     omeprazole (PRILOSEC) 40 MG capsule Take 40 mg by mouth daily.     PAROXETINE HCL PO Take by mouth.     rosuvastatin (CRESTOR) 5 MG tablet Take 1 tablet by mouth daily.     SUMAtriptan (IMITREX) 100 MG tablet TAKE ONE TABLET BY MOUTH AT ONSET OF HEADACHE; MAY REPEAT ONE TABLET IN 2 HOURS IF NEEDED. MAX OF 200 MG PER DAY     Treprostinil (TYVASO) 0.6 MG/ML SOLN Inhale into the lungs 4 (four) times daily. 10 breaths, increases to 12 breaths on Wednesday.     No current facility-administered medications for this visit.    Allergies  Allergen Reactions   Bee Venom Rash   Shellfish Allergy Hives   Nintedanib Other (See Comments)    SIRS, fevers   Pseudoephedrine Anxiety    Diagnoses:  Major depressive disorder, recurrent, moderate, irritability Plan of Care: I will meet with the patient every 2 to 3 weeks in person Target date: December 15, 2022                                  French Ana, [facility]                  French Ana,  [facility]               French Ana, [facility]               French Ana, [facility]

## 2022-08-31 NOTE — Telephone Encounter (Signed)
Can he just wait till seen by Buelah Manis next week so o2 and pred all addressed at that time

## 2022-09-05 ENCOUNTER — Encounter: Payer: Self-pay | Admitting: Primary Care

## 2022-09-05 ENCOUNTER — Ambulatory Visit: Payer: Medicare Other | Admitting: Primary Care

## 2022-09-05 VITALS — BP 132/64 | HR 94 | Temp 98.6°F | Ht 68.0 in | Wt 197.2 lb

## 2022-09-05 DIAGNOSIS — J849 Interstitial pulmonary disease, unspecified: Secondary | ICD-10-CM | POA: Diagnosis not present

## 2022-09-05 DIAGNOSIS — J9611 Chronic respiratory failure with hypoxia: Secondary | ICD-10-CM | POA: Diagnosis not present

## 2022-09-05 DIAGNOSIS — R053 Chronic cough: Secondary | ICD-10-CM

## 2022-09-05 MED ORDER — PREDNISONE 10 MG PO TABS
ORAL_TABLET | ORAL | 0 refills | Status: DC
Start: 2022-09-05 — End: 2023-05-20

## 2022-09-05 NOTE — Patient Instructions (Addendum)
  No change to echocardiogram/ bnp was normal CTA negative for PE, possible infiltrate Continue Esbriet and Tyvaso as directed  PFTs are relatively stable / FEV1 decline 6% however could be due to acute illness Taper prednisone- 30mg  x 5 days; 20mg  x 5 days; 10mg  x 5 days; 5mg  x 5 days; then stop   Orders: High resolution CT chest in Nov/December   Follow-up: 3 months with Dr. Marchelle Gearing after HRCT imaging

## 2022-09-05 NOTE — Progress Notes (Signed)
@Patient  ID: Dustin Dean, male    DOB: [birth year], 72 y.o.   MRN: [id number]  Chief Complaint  Patient presents with   Hospitalization Follow-up    Referring provider: [name], MD  HPI: 72 year old male, never smoked.  Past medical history significant for ILD, chronic respiratory failure, COVID-19.  Patient of Dr. Marchelle Gearing.  09/05/2022 Patient presents today for hospital follow-up.  Patient was admitted for community-acquired pneumonia and discharged on 08/22/22. He originally presents to ED with low oxygen levels. CTA negative for PE, possible infiltrate.  He completed course of cefepime and doxycycline. He had a video visit with Dr. Marchelle Gearing on 08/21/22 while admitted to [facility]. He was advised to stay on prednisone until follow-up in clinic.He is oxygen dependent for hx chronic respiratory failure. He was discharged on 6L oxygen; he is currently using 2L oxygen at rest and 3L with exertion. Needs large home oxygen concentrator. Continues Esbriet and Tyvaso as directed. Patient had PFT on July 30th prior to admission. Echo/ bnp normal.   Allergies  Allergen Reactions   Bee Venom Rash   Shellfish Allergy Hives   Nintedanib Other (See Comments)    SIRS, fevers   Pseudoephedrine Anxiety    Immunization History  Administered Date(s) Administered   COVID-19, mRNA, vaccine(Comirnaty)12 years and older 10/19/2021   Fluad Quad(high Dose 65+) 10/23/2017, 09/30/2019, 10/14/2020, 10/18/2020   Influenza-Unspecified 11/11/2009, 11/08/2011, 11/03/2013, 11/22/2014, 11/10/2015   Moderna Covid-19 Vaccine Bivalent Booster 55yrs & up 10/24/2020   Moderna Sars-Covid-2 Vaccination 03/19/2019, 04/16/2019, 11/16/2019, 05/07/2020   Pneumococcal Conjugate-13 12/08/2007, 12/21/2015, 09/04/2018   Pneumococcal Polysaccharide-23 12/08/2007, 06/21/2017, 06/16/2018   Tdap 12/08/2007, 11/27/2018   Zoster Recombinant(Shingrix) 10/30/2013, 09/04/2018, 11/27/2018   Zoster, Live 10/30/2013    Zoster, Unspecified 10/30/2013    Past Medical History:  Diagnosis Date   Aortic stenosis    Flu    Pneumonia    Prostate cancer (HCC)    Pulmonary fibrosis (HCC)    Skin cancer     Tobacco History: Social History   Tobacco Use  Smoking Status Never   Passive exposure: Past  Smokeless Tobacco Never   Counseling given: Not Answered   Outpatient Medications Prior to Visit  Medication Sig Dispense Refill   acetaminophen (TYLENOL) 325 MG tablet Take by mouth as needed.     aspirin-acetaminophen-caffeine (EXCEDRIN MIGRAINE) 250-250-65 MG tablet Take by mouth as needed.     Calcium Carb-Cholecalciferol (CALCIUM/VITAMIN D PO) Take 1 tablet by mouth daily.     Cetirizine HCl (ZYRTEC PO) Take by mouth.     ESBRIET 801 MG TABS Take 1 tablet (801 mg total) by mouth 3 (three) times daily with meals. 270 tablet 1   fluorouracil (EFUDEX) 5 % cream Apply topically.     fluticasone (FLONASE) 50 MCG/ACT nasal spray Place into both nostrils daily.     gabapentin (NEURONTIN) 300 MG capsule Take 300 mg by mouth 3 (three) times daily.     ipratropium (ATROVENT) 0.06 % nasal spray Place 2 sprays into both nostrils 3 (three) times daily.     loperamide (IMODIUM) 2 MG capsule Take 1 mg by mouth once.     meclizine (ANTIVERT) 25 MG tablet Take 25 mg by mouth 2 (two) times daily as needed.     Multiple Vitamin (MULTIVITAMIN) capsule Take 1 capsule by mouth every morning.     naproxen sodium (ALEVE) 220 MG tablet Take 220 mg by mouth daily as needed.     omeprazole (PRILOSEC) 40 MG capsule Take 40 mg  by mouth daily.     PAROXETINE HCL PO Take by mouth.     rosuvastatin (CRESTOR) 5 MG tablet Take 1 tablet by mouth daily.     SUMAtriptan (IMITREX) 100 MG tablet TAKE ONE TABLET BY MOUTH AT ONSET OF HEADACHE; MAY REPEAT ONE TABLET IN 2 HOURS IF NEEDED. MAX OF 200 MG PER DAY     Treprostinil (TYVASO) 0.6 MG/ML SOLN Inhale into the lungs 4 (four) times daily. 10 breaths, increases to 12 breaths on  Wednesday.     No facility-administered medications prior to visit.    Review of Systems  Review of Systems  Constitutional: Negative.   HENT: Negative.    Respiratory: Negative.    Cardiovascular: Negative.      Physical Exam  BP 132/64 (BP Location: Right Arm, Patient Position: Sitting, Cuff Size: Normal)   Pulse 94   Temp 98.6 F (37 C) (Oral)   Ht 5\' 8"  (1.727 m)   Wt 197 lb 3.2 oz (89.4 kg)   SpO2 95% Comment: 2L POC pulsed oxygen  BMI 29.98 kg/m  Physical Exam Constitutional:      Appearance: Normal appearance.  HENT:     Head: Normocephalic and atraumatic.  Cardiovascular:     Rate and Rhythm: Normal rate.  Pulmonary:     Effort: Pulmonary effort is normal.     Breath sounds: Rales present.  Skin:    General: Skin is warm and dry.  Neurological:     General: No focal deficit present.     Mental Status: He is alert and oriented to person, place, and time. Mental status is at baseline.      Lab Results:  CBC    Component Value Date/Time   WBC 11.2 (H) 01/09/2022 2337   RBC 4.84 01/09/2022 2337   HGB 13.8 01/09/2022 2337   HCT 42.7 01/09/2022 2337   PLT 130 (L) 01/09/2022 2337   MCV 88.2 01/09/2022 2337   MCH 28.5 01/09/2022 2337   MCHC 32.3 01/09/2022 2337   RDW 15.7 (H) 01/09/2022 2337   LYMPHSABS 0.8 01/09/2022 2337   MONOABS 0.7 01/09/2022 2337   EOSABS 0.4 01/09/2022 2337   BASOSABS 0.1 01/09/2022 2337    BMET    Component Value Date/Time   NA 135 01/09/2022 2337   K 4.2 01/09/2022 2337   CL 101 01/09/2022 2337   CO2 29 01/09/2022 2337   GLUCOSE 107 (H) 01/09/2022 2337   BUN 17 01/09/2022 2337   CREATININE 1.35 (H) 01/09/2022 2337   CALCIUM 8.4 (L) 01/09/2022 2337   GFRNONAA 57 (L) 01/09/2022 2337   GFRAA >60 02/17/2016 1920    BNP No results found for: "BNP"  ProBNP No results found for: "PROBNP"  Imaging: No results found.   Assessment & Plan:   Community acquired pneumonia - Admitted to [facility] hospital for  community-acquired pneumonia and discharged on 08/22/22. CTA negative for PE, possible infiltrate. He completed course of cefepime and doxycycline.   Chronic respiratory failure with hypoxia (HCC) - Admitted in August for acute on chronic respiratory failure secondary to CAP vs ILD flare. He is oxygen dependent at baseline. He was discharged on 6L. Currently using 2L oxygen at rest and 3L on exertion. Needs large oxygen concentrator.   ILD (interstitial lung disease) (HCC) - Continue Esbriet and Tyvaso as directed  - No change to echocardiogram/ bnp was normal - PFTs are relatively stable / there was a 6% declined in FEV1, however, this  could've been due to CAP -  Taper prednisone- 30mg  x 5 days; 20mg  x 5 days; 10mg  x 5 days; 5mg  x 5 days; then stop  - High resolution CT chest in Nov/December  - FU with Dr. Jeronimo Greaves in 3 months    Glenford Bayley, NP 09/15/2022

## 2022-09-11 ENCOUNTER — Encounter: Payer: Self-pay | Admitting: Behavioral Health

## 2022-09-11 ENCOUNTER — Ambulatory Visit (INDEPENDENT_AMBULATORY_CARE_PROVIDER_SITE_OTHER): Payer: Medicare Other | Admitting: Behavioral Health

## 2022-09-11 DIAGNOSIS — F411 Generalized anxiety disorder: Secondary | ICD-10-CM | POA: Diagnosis not present

## 2022-09-11 NOTE — Progress Notes (Addendum)
[facility] Counselor Initial Adult Exam  Name: Dustin Dean Date: 09/11/2022 MRN: [id number] DOB: 1951-01-01 PCP: [name], MD  Time spent: 57 minutes, 2 PM until 2:57 PM spent in person with the patient at the outpatient therapy office.   Guardian/Payee: Self  Paperwork requested: Yes   Reason for Visit /Presenting Problem: Depression  The patient did see a pulmonologist and felt it was a flare up all of his condition that led him to the hospital.  He is still going to physical therapy but he is on oxygen at most of the time.  As long as he is sitting he can come off of it but anytime he is moving he has his oxygen on.  He is working on accepting that.  He did send emails to several people that he disclosed to explaining his condition and the fact that he was handling it well.  He got responses from several people but was disappointed when he did not get any type of response from several people that he felt very close to even though he said on 2 separate emails.  Some of his closest friends disappointing especially but wife because she did not say anything but the husband did ask and checks on him.  We talked about where they are and why they may not have responded as an explanation but not as an excuse.  We talked about accepting the fact that he knows he is poor due to their lives.  He says that when he gets another check that he may send enough to the want to respond to him and we encouraged him to focus on the want to do that and expressed concern for him.  He is going to the beach with his wife for a week where his sister and her husband will be and is looking forward to the time down there.  He does have a court of someone that will do landscaping for him and he is going to have a consultation with him sometime after he gets back.  He is sad that he cannot do that type of thing anymore we talked about how he could benefit from seeing it the way he wants it to be even if  he cannot do the actual labor. As we went further between sessions last time he said the distance in time was fine and we will stay with that moving forward.  He has several appointments scheduled.  He does contract for safety having no thoughts of hurting himself or anyone else. Mental Status Exam: Appearance:   Fairly Groomed     Behavior:  Appropriate  Motor:  Normal  Speech/Language:   Clear and Coherent and Normal Rate  Affect:  Appropriate  Mood:  normal  Thought process:  normal  Thought content:    WNL  Sensory/Perceptual disturbances:    WNL  Orientation:  oriented to person, place, time/date, situation, and day of week  Attention:  Good  Concentration:  Good  Memory:  WNL  Fund of knowledge:   Good  Insight:    Good  Judgment:   Good  Impulse Control:  Good     Reported Symptoms: Depression  Risk Assessment: Danger to Self:  No Self-injurious Behavior: No Danger to Others: No Duty to Warn:no Physical Aggression / Violence:No  Access to Firearms a concern: No  Gang Involvement:No  Patient / guardian was educated about steps to take if suicide or homicide risk level increases between visits: n/a While future  psychiatric events cannot be accurately predicted, the patient does not currently require acute inpatient psychiatric care and does not currently meet [facility] involuntary commitment criteria.  Substance Abuse History: Current substance abuse: No     Past Psychiatric History:   Previous psychological history is significant for depression Outpatient Providers: Primary care physician History of Psych Hospitalization:  None reported Psychological Testing:  n/a    Abuse History:  Victim of: No.,  None reported    Report needed: No. Victim of Neglect:No. Perpetrator of n/a Witness / Exposure to Domestic Violence: None reported  Protective Services Involvement: No  Witness to [employer] Violence:  No   Family History:  Family History  Problem  Relation Age of Onset   Cancer Mother    Congestive Heart Failure Father    Prostate cancer Father    Healthy Sister     Living situation: the patient lives with their spouse  Sexual Orientation: Straight  Relationship Status: married  Name of spouse / other: Did not discuss If a parent, number of children / ages: Adults  Support Systems: spouse  Financial Stress:  No   Income/Employment/Disability: Doctor, general practice: No   Educational History: Education: high school diploma/GED  Religion/Sprituality/World View: Did not discuss  Any cultural differences that may affect / interfere with treatment:  not applicable   Recreation/Hobbies: Time with family  Stressors: Health problems    Strengths: Supportive Relationships, Hopefulness, Self Advocate, and Able to Communicate Effectively  Barriers:     Legal History: Pending legal issue / charges: The patient has no significant history of legal issues. History of legal issue / charges:  Not applicable  Medical History/Surgical History: reviewed Past Medical History:  Diagnosis Date   Aortic stenosis    Flu    Pneumonia    Prostate cancer (HCC)    Pulmonary fibrosis (HCC)    Skin cancer     Past Surgical History:  Procedure Laterality Date   HERNIA REPAIR     NASAL SEPTUM SURGERY     SKIN CANCER EXCISION      Medications: Current Outpatient Medications  Medication Sig Dispense Refill   acetaminophen (TYLENOL) 325 MG tablet Take by mouth as needed.     aspirin-acetaminophen-caffeine (EXCEDRIN MIGRAINE) 250-250-65 MG tablet Take by mouth as needed.     Calcium Carb-Cholecalciferol (CALCIUM/VITAMIN D PO) Take 1 tablet by mouth daily.     Cetirizine HCl (ZYRTEC PO) Take by mouth.     ESBRIET 801 MG TABS Take 1 tablet (801 mg total) by mouth 3 (three) times daily with meals. 270 tablet 1   fluorouracil (EFUDEX) 5 % cream Apply topically.     fluticasone (FLONASE) 50 MCG/ACT nasal spray  Place into both nostrils daily.     gabapentin (NEURONTIN) 300 MG capsule Take 300 mg by mouth 3 (three) times daily.     ipratropium (ATROVENT) 0.06 % nasal spray Place 2 sprays into both nostrils 3 (three) times daily.     loperamide (IMODIUM) 2 MG capsule Take 1 mg by mouth once.     meclizine (ANTIVERT) 25 MG tablet Take 25 mg by mouth 2 (two) times daily as needed.     Multiple Vitamin (MULTIVITAMIN) capsule Take 1 capsule by mouth every morning.     naproxen sodium (ALEVE) 220 MG tablet Take 220 mg by mouth daily as needed.     omeprazole (PRILOSEC) 40 MG capsule Take 40 mg by mouth daily.     PAROXETINE HCL PO  Take by mouth.     predniSONE (DELTASONE) 10 MG tablet 30mg  x 5 days; 20mg  x 5 days; 10mg  x 5 days; 5mg  x 5 days; then stop 35 tablet 0   rosuvastatin (CRESTOR) 5 MG tablet Take 1 tablet by mouth daily.     SUMAtriptan (IMITREX) 100 MG tablet TAKE ONE TABLET BY MOUTH AT ONSET OF HEADACHE; MAY REPEAT ONE TABLET IN 2 HOURS IF NEEDED. MAX OF 200 MG PER DAY     Treprostinil (TYVASO) 0.6 MG/ML SOLN Inhale into the lungs 4 (four) times daily. 10 breaths, increases to 12 breaths on Wednesday.     No current facility-administered medications for this visit.    Allergies  Allergen Reactions   Bee Venom Rash   Shellfish Allergy Hives   Nintedanib Other (See Comments)    SIRS, fevers   Pseudoephedrine Anxiety    Diagnoses:  Major depressive disorder, recurrent, moderate, irritability Plan of Care: I will meet with the patient every 2 to 3 weeks in person Target date: December 15, 2022                                  French Ana, [facility]                  French Ana, [facility]               French Ana, [facility]               French Ana, [facility]               French Ana, [facility]

## 2022-09-15 DIAGNOSIS — J189 Pneumonia, unspecified organism: Secondary | ICD-10-CM | POA: Insufficient documentation

## 2022-09-15 NOTE — Assessment & Plan Note (Addendum)
-   Admitted in August for acute on chronic respiratory failure secondary to CAP vs ILD flare. He is oxygen dependent at baseline. He was discharged on 6L. Currently using 2L oxygen at rest and 3L on exertion. Needs large oxygen concentrator.

## 2022-09-15 NOTE — Assessment & Plan Note (Addendum)
-   Continue Esbriet and Tyvaso as directed  - No change to echocardiogram/ bnp was normal - PFTs are relatively stable / there was a 6% declined in FEV1, however, this  could've been due to CAP -Taper prednisone- 30mg  x 5 days; 20mg  x 5 days; 10mg  x 5 days; 5mg  x 5 days; then stop  - High resolution CT chest in Nov/December  - FU with Dr. Jeronimo Greaves in 3 months

## 2022-09-15 NOTE — Assessment & Plan Note (Signed)
-   Admitted to [facility] hospital for community-acquired pneumonia and discharged on 08/22/22. CTA negative for PE, possible infiltrate. He completed course of cefepime and doxycycline.

## 2022-10-01 ENCOUNTER — Encounter: Payer: Self-pay | Admitting: Behavioral Health

## 2022-10-01 ENCOUNTER — Ambulatory Visit (INDEPENDENT_AMBULATORY_CARE_PROVIDER_SITE_OTHER): Payer: Medicare Other | Admitting: Behavioral Health

## 2022-10-01 DIAGNOSIS — F331 Major depressive disorder, recurrent, moderate: Secondary | ICD-10-CM

## 2022-10-01 NOTE — Progress Notes (Signed)
[facility] Counselor Initial Adult Exam  Name: Dustin Dean Date: 10/01/2022 MRN: [id number] DOB: 1950/12/19 PCP: [name], MD  Time spent: 42 minutes, 3 PM until 3:42 PM This session was held via video teletherapy. The patient consented to the video teletherapy and was located in his home during this session. He is aware it is the responsibility of the patient to secure confidentiality on his end of the session. The provider was in a private home office for the duration of this session.    The patient arrived on time for his Caregility session    Guardian/Payee: Self  Paperwork requested: Yes   Reason for Visit /Presenting Problem: Depression  The patient and his wife did go to the beach for a week which for the most part he said he enjoyed but he is noticing a difference in how he feels.  The oxygen level he went up to and his last meeting with this pulmonologist has not been enough.  He is getting very winded and tired much more easily.  He reports that even a couple of steps can be difficult.  He used a walking stick at the beach most of the time to be steady.  He is taking a nebulizer 4 times a day and is leaving him with a headache by the end of the day.  He sent a message to his pulmonologist but has not heard back yet.  She previously indicated that an oral medication might be a different change but that will need to be addressed through cardiologist so he is going to reach out to his cardiologist.  He does not feel the nebulizer provides much benefit and gives him a headache.  He also was very tired by the time early evening comes.  He talked about scheduling a short nap in the middle of the day.  He says his oxygen levels dropped until he sits down to recuperate and then goes back up slightly but he cannot last long.  Feels that he is changing and understands doctors cannot give him any definitive timeframe.  He is in the process of accepting his diagnosis and is  seeing the changes physically.  His faith is also important to him so even though he does not know how much time he has he also believes strongly because of his faith that have not will be where he goes.  I encouraged him to continue to use his coping skills including those of challenging negative for anxious thoughts.  I will meet with him every 2 weeks but he is aware that he can call if he needs anything in between and I will return his call. He does contract for safety having no thoughts of hurting himself or anyone else. Mental Status Exam: Appearance:   Fairly Groomed     Behavior:  Appropriate  Motor:  Normal  Speech/Language:   Clear and Coherent and Normal Rate  Affect:  Appropriate  Mood:  normal  Thought process:  normal  Thought content:    WNL  Sensory/Perceptual disturbances:    WNL  Orientation:  oriented to person, place, time/date, situation, and day of week  Attention:  Good  Concentration:  Good  Memory:  WNL  Fund of knowledge:   Good  Insight:    Good  Judgment:   Good  Impulse Control:  Good     Reported Symptoms: Depression  Risk Assessment: Danger to Self:  No Self-injurious Behavior: No Danger to Others: No Duty to Warn:no  Physical Aggression / Violence:No  Access to Firearms a concern: No  Gang Involvement:No  Patient / guardian was educated about steps to take if suicide or homicide risk level increases between visits: n/a While future psychiatric events cannot be accurately predicted, the patient does not currently require acute inpatient psychiatric care and does not currently meet [facility] involuntary commitment criteria.  Substance Abuse History: Current substance abuse: No     Past Psychiatric History:   Previous psychological history is significant for depression Outpatient Providers: Primary care physician History of Psych Hospitalization:  None reported Psychological Testing:  n/a    Abuse History:  Victim of: No.,  None reported     Report needed: No. Victim of Neglect:No. Perpetrator of n/a Witness / Exposure to Domestic Violence: None reported  Protective Services Involvement: No  Witness to [employer] Violence:  No   Family History:  Family History  Problem Relation Age of Onset   Cancer Mother    Congestive Heart Failure Father    Prostate cancer Father    Healthy Sister     Living situation: the patient lives with their spouse  Sexual Orientation: Straight  Relationship Status: married  Name of spouse / other: Did not discuss If a parent, number of children / ages: Adults  Support Systems: spouse  Financial Stress:  No   Income/Employment/Disability: Doctor, general practice: No   Educational History: Education: high school diploma/GED  Religion/Sprituality/World View: Did not discuss  Any cultural differences that may affect / interfere with treatment:  not applicable   Recreation/Hobbies: Time with family  Stressors: Health problems    Strengths: Supportive Relationships, Hopefulness, Self Advocate, and Able to Communicate Effectively  Barriers:     Legal History: Pending legal issue / charges: The patient has no significant history of legal issues. History of legal issue / charges:  Not applicable  Medical History/Surgical History: reviewed Past Medical History:  Diagnosis Date   Aortic stenosis    Flu    Pneumonia    Prostate cancer (HCC)    Pulmonary fibrosis (HCC)    Skin cancer     Past Surgical History:  Procedure Laterality Date   HERNIA REPAIR     NASAL SEPTUM SURGERY     SKIN CANCER EXCISION      Medications: Current Outpatient Medications  Medication Sig Dispense Refill   acetaminophen (TYLENOL) 325 MG tablet Take by mouth as needed.     aspirin-acetaminophen-caffeine (EXCEDRIN MIGRAINE) 250-250-65 MG tablet Take by mouth as needed.     Calcium Carb-Cholecalciferol (CALCIUM/VITAMIN D PO) Take 1 tablet by mouth daily.     Cetirizine  HCl (ZYRTEC PO) Take by mouth.     ESBRIET 801 MG TABS Take 1 tablet (801 mg total) by mouth 3 (three) times daily with meals. 270 tablet 1   fluorouracil (EFUDEX) 5 % cream Apply topically.     fluticasone (FLONASE) 50 MCG/ACT nasal spray Place into both nostrils daily.     gabapentin (NEURONTIN) 300 MG capsule Take 300 mg by mouth 3 (three) times daily.     ipratropium (ATROVENT) 0.06 % nasal spray Place 2 sprays into both nostrils 3 (three) times daily.     loperamide (IMODIUM) 2 MG capsule Take 1 mg by mouth once.     meclizine (ANTIVERT) 25 MG tablet Take 25 mg by mouth 2 (two) times daily as needed.     Multiple Vitamin (MULTIVITAMIN) capsule Take 1 capsule by mouth every morning.  naproxen sodium (ALEVE) 220 MG tablet Take 220 mg by mouth daily as needed.     omeprazole (PRILOSEC) 40 MG capsule Take 40 mg by mouth daily.     PAROXETINE HCL PO Take by mouth.     predniSONE (DELTASONE) 10 MG tablet 30mg  x 5 days; 20mg  x 5 days; 10mg  x 5 days; 5mg  x 5 days; then stop 35 tablet 0   rosuvastatin (CRESTOR) 5 MG tablet Take 1 tablet by mouth daily.     SUMAtriptan (IMITREX) 100 MG tablet TAKE ONE TABLET BY MOUTH AT ONSET OF HEADACHE; MAY REPEAT ONE TABLET IN 2 HOURS IF NEEDED. MAX OF 200 MG PER DAY     Treprostinil (TYVASO) 0.6 MG/ML SOLN Inhale into the lungs 4 (four) times daily. 10 breaths, increases to 12 breaths on Wednesday.     No current facility-administered medications for this visit.    Allergies  Allergen Reactions   Bee Venom Rash   Shellfish Allergy Hives   Nintedanib Other (See Comments)    SIRS, fevers   Pseudoephedrine Anxiety    Diagnoses:  Major depressive disorder, recurrent, moderate, irritability Plan of Care: I will meet with the patient every 2 to 3 weeks in person Target date: December 15, 2022 progress: 35%                                  French Ana, [facility]                  French Ana,  [facility]               French Ana, [facility]               French Ana, [facility]               French Ana, [facility]               French Ana, [facility]

## 2022-10-04 NOTE — Telephone Encounter (Signed)
Yes I do think that is what caused your flare up. Unclear chances, causes can be any type of lung infection- viral or bacterial, aspiration,over exertion, some medications, allergies/air pollutants. Very hard to predict. Make sure you are up to date with all vaccines. I am ok with him adjusting his oxygen. Goal O2 should be bettween 88-92%. What ever liter flow can keep his levels above that number is best. You dont want to over use oxygen cause you can retain CO2 but am ok with you pumping it up temporarily if needed

## 2022-10-11 ENCOUNTER — Telehealth: Payer: Self-pay | Admitting: Internal Medicine

## 2022-10-11 DIAGNOSIS — J849 Interstitial pulmonary disease, unspecified: Secondary | ICD-10-CM

## 2022-10-11 NOTE — Telephone Encounter (Signed)
Patient states needs refill for Esbriet. Pharmacy is [facility]. Patient phone number is [phone].

## 2022-10-12 MED ORDER — ESBRIET 801 MG PO TABS
801.0000 mg | ORAL_TABLET | Freq: Three times a day (TID) | ORAL | 2 refills | Status: DC
Start: 2022-10-12 — End: 2023-07-30

## 2022-10-12 NOTE — Telephone Encounter (Signed)
Refill sent for ESBRIET to [facility] ([facility]) for Esbriet: [phone]  Dose: 801mg  three times daily  Last OV: 09/05/22 Provider: Dr. Marchelle Gearing Pertinent labs: LFTs on 08/16/2022 wnl (per [facility]where)  Next OV: 12/20/2022  Routing to scheduling team for follow-up on appt scheduling  Chesley Mires, PharmD, MPH, BCPS Clinical Pharmacist (Rheumatology and Pulmonology)

## 2022-10-16 ENCOUNTER — Encounter: Payer: Self-pay | Admitting: Behavioral Health

## 2022-10-16 ENCOUNTER — Ambulatory Visit (INDEPENDENT_AMBULATORY_CARE_PROVIDER_SITE_OTHER): Payer: Medicare Other | Admitting: Behavioral Health

## 2022-10-16 DIAGNOSIS — F331 Major depressive disorder, recurrent, moderate: Secondary | ICD-10-CM | POA: Diagnosis not present

## 2022-10-16 DIAGNOSIS — R454 Irritability and anger: Secondary | ICD-10-CM

## 2022-10-16 DIAGNOSIS — F411 Generalized anxiety disorder: Secondary | ICD-10-CM

## 2022-10-16 NOTE — Progress Notes (Signed)
[facility] Counselor Initial Adult Exam  Name: Dustin Dean Date: 10/16/2022 MRN: [id number] DOB: Dec 11, 1950 PCP: [name], MD  Time spent: 55 minutes, 11 AM until 11:55 AM spent in person with the patient in the outpatient therapist office.  The patient indicated that his sister and her neighbor who had not inquired about his diagnoses have done so recently.  He said he thinks that his sister may be uncomfortable talking about his diagnosis and is thankful that she inquired.  There is a family gathering with his sister and 2 other cousins and all of their spouses in a few weeks and he feels that his cousins will ask and then may prompt his sister to ask some more questions also.  He is fairly comfortable talking about it and wants his sister be comfortable also.  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  He is going to reach out to his primary care physician for possible referral to a neurologist feeling it might be a nerve related issue.  He says is tolerable but on top of dealing with the other medical diagnosis is very frustrating for him.  We talked about ways that he copes with that.  He feels that in general he is managing his anxiety and his diagnosis and what comes along with that physically.  He is thankful to have good supports from his wife adopted family neighbors etc.  They had dinner with his adopted family and says that is always a time of great joy.  He says that he has always been this way but even more so now wants to make sure that he tells people that he appreciates them loved etc. and I encouraged that practice.  He does contract for safety having no thoughts of hurting himself or anyone else.   Guardian/Payee: Self  Paperwork requested: Yes   Reason for Visit /Presenting Problem: Depression  The patient and his wife did go to the beach for a week which for the most part he said  he enjoyed but he is noticing a difference in how he feels.  The oxygen level he went up to and his last meeting with this pulmonologist has not been enough.  He is getting very winded and tired much more easily.  He reports that even a couple of steps can be difficult.  He used a walking stick at the beach most of the time to be steady.  He is taking a nebulizer 4 times a day and is leaving him with a headache by the end of the day.  He sent a message to his pulmonologist but has not heard back yet.  She previously indicated that an oral medication might be a different change but that will need to be addressed through cardiologist so he is going to reach out to his cardiologist.  He does not feel the nebulizer provides much benefit and gives him a headache.  He also was very tired by the time early evening comes.  He talked about scheduling a short nap in the middle of the day.  He says his oxygen levels dropped until he sits down to recuperate and then goes back up slightly but he cannot last long.  Feels that he is changing and understands doctors cannot give him any definitive timeframe.  He is in the process of accepting his diagnosis and is seeing the changes physically.  His faith is also important to  him so even though he does not know how much time he has he also believes strongly because of his faith that have not will be where he goes.  I encouraged him to continue to use his coping skills including those of challenging negative for anxious thoughts.  I will meet with him every 2 weeks but he is aware that he can call if he needs anything in between and I will return his call. He does contract for safety having no thoughts of hurting himself or anyone else. Mental Status Exam: Appearance:   Fairly Groomed     Behavior:  Appropriate  Motor:  Normal  Speech/Language:   Clear and Coherent and Normal Rate  Affect:  Appropriate  Mood:  normal  Thought process:  normal  Thought content:    WNL   Sensory/Perceptual disturbances:    WNL  Orientation:  oriented to person, place, time/date, situation, and day of week  Attention:  Good  Concentration:  Good  Memory:  WNL  Fund of knowledge:   Good  Insight:    Good  Judgment:   Good  Impulse Control:  Good     Reported Symptoms: Depression  Risk Assessment: Danger to Self:  No Self-injurious Behavior: No Danger to Others: No Duty to Warn:no Physical Aggression / Violence:No  Access to Firearms a concern: No  Gang Involvement:No  Patient / guardian was educated about steps to take if suicide or homicide risk level increases between visits: n/a While future psychiatric events cannot be accurately predicted, the patient does not currently require acute inpatient psychiatric care and does not currently meet [facility] involuntary commitment criteria.  Substance Abuse History: Current substance abuse: No     Past Psychiatric History:   Previous psychological history is significant for depression Outpatient Providers: Primary care physician History of Psych Hospitalization:  None reported Psychological Testing:  n/a    Abuse History:  Victim of: No.,  None reported    Report needed: No. Victim of Neglect:No. Perpetrator of n/a Witness / Exposure to Domestic Violence: None reported  Protective Services Involvement: No  Witness to [employer] Violence:  No   Family History:  Family History  Problem Relation Age of Onset   Cancer Mother    Congestive Heart Failure Father    Prostate cancer Father    Healthy Sister     Living situation: the patient lives with their spouse  Sexual Orientation: Straight  Relationship Status: married  Name of spouse / other: Did not discuss If a parent, number of children / ages: Adults  Support Systems: spouse  Financial Stress:  No   Income/Employment/Disability: Doctor, general practice: No   Educational History: Education: high school  diploma/GED  Religion/Sprituality/World View: Did not discuss  Any cultural differences that may affect / interfere with treatment:  not applicable   Recreation/Hobbies: Time with family  Stressors: Health problems    Strengths: Supportive Relationships, Hopefulness, Self Advocate, and Able to Communicate Effectively  Barriers:     Legal History: Pending legal issue / charges: The patient has no significant history of legal issues. History of legal issue / charges:  Not applicable  Medical History/Surgical History: reviewed Past Medical History:  Diagnosis Date   Aortic stenosis    Flu    Pneumonia    Prostate cancer (HCC)    Pulmonary fibrosis (HCC)    Skin cancer     Past Surgical History:  Procedure Laterality Date   HERNIA REPAIR  NASAL SEPTUM SURGERY     SKIN CANCER EXCISION      Medications: Current Outpatient Medications  Medication Sig Dispense Refill   acetaminophen (TYLENOL) 325 MG tablet Take by mouth as needed.     aspirin-acetaminophen-caffeine (EXCEDRIN MIGRAINE) 250-250-65 MG tablet Take by mouth as needed.     Calcium Carb-Cholecalciferol (CALCIUM/VITAMIN D PO) Take 1 tablet by mouth daily.     Cetirizine HCl (ZYRTEC PO) Take by mouth.     ESBRIET 801 MG TABS Take 1 tablet (801 mg total) by mouth 3 (three) times daily with meals. 270 tablet 2   fluorouracil (EFUDEX) 5 % cream Apply topically.     fluticasone (FLONASE) 50 MCG/ACT nasal spray Place into both nostrils daily.     gabapentin (NEURONTIN) 300 MG capsule Take 300 mg by mouth 3 (three) times daily.     ipratropium (ATROVENT) 0.06 % nasal spray Place 2 sprays into both nostrils 3 (three) times daily.     loperamide (IMODIUM) 2 MG capsule Take 1 mg by mouth once.     meclizine (ANTIVERT) 25 MG tablet Take 25 mg by mouth 2 (two) times daily as needed.     Multiple Vitamin (MULTIVITAMIN) capsule Take 1 capsule by mouth every morning.     naproxen sodium (ALEVE) 220 MG tablet Take 220 mg by  mouth daily as needed.     omeprazole (PRILOSEC) 40 MG capsule Take 40 mg by mouth daily.     PAROXETINE HCL PO Take by mouth.     predniSONE (DELTASONE) 10 MG tablet 30mg  x 5 days; 20mg  x 5 days; 10mg  x 5 days; 5mg  x 5 days; then stop 35 tablet 0   rosuvastatin (CRESTOR) 5 MG tablet Take 1 tablet by mouth daily.     SUMAtriptan (IMITREX) 100 MG tablet TAKE ONE TABLET BY MOUTH AT ONSET OF HEADACHE; MAY REPEAT ONE TABLET IN 2 HOURS IF NEEDED. MAX OF 200 MG PER DAY     No current facility-administered medications for this visit.    Allergies  Allergen Reactions   Bee Venom Rash   Shellfish Allergy Hives   Nintedanib Other (See Comments)    SIRS, fevers   Pseudoephedrine Anxiety    Diagnoses:  Major depressive disorder, recurrent, moderate, irritability Plan of Care: I will meet with the patient every 2 to 3 weeks in person Target date: December 15, 2022 progress: 35%                                  French Ana, [facility]                  French Ana, [facility]               French Ana, [facility]               French Ana, [facility]               French Ana, [facility]               French Ana, [facility]               French Ana, [facility]

## 2022-10-19 ENCOUNTER — Encounter: Payer: Self-pay | Admitting: Internal Medicine

## 2022-10-19 NOTE — Telephone Encounter (Signed)
Mychart sent by pt:  [name] (supporting Kalman Shan, MD)16 minutes ago (2:43 PM)   KR Dr. 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 Could it be a side effect from Esbriet?   The itching is best described as: Persistent and unrelenting Difficult to satisfy Providing little relief from scratching A tingling, crawling, burning, or stabbing sensation "Under the skin" or "deep-seated" Often accompanied by nerve pain Getting more intense with scratching   I want to do something about this because it can be intense and make me miserable. Topical creams do not work.   Can you please help or would it make sense to see a neurologist?   Thank you, Dustin Dean    Dr. Marchelle Gearing, please advise.

## 2022-10-19 NOTE — Telephone Encounter (Signed)
Called patient -> advied stop esbriet 10/19/2022 x 2 weeks -> if going to restart start at 1 pill tid x 1 week -> then 2 pill tid x 1 week -> then 3 pill tid  Keep followup 12/11/22

## 2022-10-22 ENCOUNTER — Encounter: Payer: Self-pay | Admitting: Internal Medicine

## 2022-10-27 NOTE — Telephone Encounter (Signed)
New message sent by pt:  [name] (supporting Kalman Shan, MD)Yesterday (9:41 AM)   KR OK thank you. I'll hold off for now.   On another topic, can you please let Dr. Marchelle Gearing know that today marks seven days that I have been off Esbriet to see if there is any change in my itching problem. No change in the itching. Should I return to taking Esbriet? What suggestion does he have to relieve the itching?   Thanks, Dustin Dean    Dr. Marchelle Gearing, please advise.

## 2022-10-29 ENCOUNTER — Ambulatory Visit (INDEPENDENT_AMBULATORY_CARE_PROVIDER_SITE_OTHER): Payer: Medicare Other | Admitting: Behavioral Health

## 2022-10-29 ENCOUNTER — Encounter: Payer: Self-pay | Admitting: Behavioral Health

## 2022-10-29 DIAGNOSIS — F411 Generalized anxiety disorder: Secondary | ICD-10-CM

## 2022-10-29 DIAGNOSIS — F331 Major depressive disorder, recurrent, moderate: Secondary | ICD-10-CM

## 2022-10-29 DIAGNOSIS — R454 Irritability and anger: Secondary | ICD-10-CM

## 2022-10-29 NOTE — Progress Notes (Signed)
[facility] Counselor Initial Adult Exam  Name: Dustin Dean Date: 10/29/2022 MRN: [id number] DOB: 16-Aug-1950 PCP: [name], MD  Time spent: 58 minutes,   3 PM until 3:58 PM  spent in person with the patient in the outpatient therapist office. The patient is still trying to figure out the cause of one of the medical issues we discussed in the previous session.  He came off of one of the medications and said it did not make a difference and is waiting for a response back from his pulmonologist to see what he wants to try next.  He still feels that it may be neurological in nature.  He notices that he is having difficulty with breathing and is on oxygen most of the time.  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  He says he has a gut feeling that based on the progress of the disease he may not have within a year although he knows medically may not be accurate.  He is at peace with that is the case.  He and his wife just in general have started looking at things like funeral arrangements etc.  He said those are difficult conversations but he knows are necessary.  We talked about how he has a conversation with his wife about what his feelings have been recently about the progression of the disease.  He acknowledges that he has days where it is hard to get anything done physically but days where he just feels "blah.".  We talked about doing what he can do.  He said there is a box of pictures that he needs to go to bed he is having a hard time getting started.  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  He does contract  for safety having no thoughts of hurting himself or anyone else.   Guardian/Payee: Self  Paperwork requested: Yes   Reason for Visit /Presenting Problem: Depression  . He does contract for safety having no thoughts of hurting himself or anyone else. Mental Status Exam: Appearance:   Fairly Groomed     Behavior:  Appropriate  Motor:  Normal  Speech/Language:   Clear and Coherent and Normal Rate  Affect:  Appropriate  Mood:  normal  Thought process:  normal  Thought content:    WNL  Sensory/Perceptual disturbances:    WNL  Orientation:  oriented to person, place, time/date, situation, and day of week  Attention:  Good  Concentration:  Good  Memory:  WNL  Fund of knowledge:   Good  Insight:    Good  Judgment:   Good  Impulse Control:  Good     Reported Symptoms: Depression  Risk Assessment: Danger to Self:  No Self-injurious Behavior: No Danger to Others: No Duty to Warn:no Physical Aggression / Violence:No  Access to Firearms a concern: No  Gang Involvement:No  Patient / guardian was educated about steps to take if suicide or homicide risk level increases between visits: n/a While future psychiatric events cannot be accurately predicted, the patient does not currently  require acute inpatient psychiatric care and does not currently meet [facility] involuntary commitment criteria.  Substance Abuse History: Current substance abuse: No     Past Psychiatric History:   Previous psychological history is significant for depression Outpatient Providers: Primary care physician History of Psych Hospitalization:  None reported Psychological Testing:  n/a    Abuse History:  Victim of: No.,  None reported    Report needed: No. Victim of Neglect:No. Perpetrator of n/a Witness / Exposure to Domestic Violence: None reported  Protective Services Involvement: No  Witness to [employer] Violence:  No   Family History:  Family History  Problem Relation Age of Onset    Cancer Mother    Congestive Heart Failure Father    Prostate cancer Father    Healthy Sister     Living situation: the patient lives with their spouse  Sexual Orientation: Straight  Relationship Status: married  Name of spouse / other: Did not discuss If a parent, number of children / ages: Adults  Support Systems: spouse  Financial Stress:  No   Income/Employment/Disability: Doctor, general practice: No   Educational History: Education: high school diploma/GED  Religion/Sprituality/World View: Did not discuss  Any cultural differences that may affect / interfere with treatment:  not applicable   Recreation/Hobbies: Time with family  Stressors: Health problems    Strengths: Supportive Relationships, Hopefulness, Self Advocate, and Able to Communicate Effectively  Barriers:     Legal History: Pending legal issue / charges: The patient has no significant history of legal issues. History of legal issue / charges:  Not applicable  Medical History/Surgical History: reviewed Past Medical History:  Diagnosis Date   Aortic stenosis    Flu    Pneumonia    Prostate cancer (HCC)    Pulmonary fibrosis (HCC)    Skin cancer     Past Surgical History:  Procedure Laterality Date   HERNIA REPAIR     NASAL SEPTUM SURGERY     SKIN CANCER EXCISION      Medications: Current Outpatient Medications  Medication Sig Dispense Refill   acetaminophen (TYLENOL) 325 MG tablet Take by mouth as needed.     aspirin-acetaminophen-caffeine (EXCEDRIN MIGRAINE) 250-250-65 MG tablet Take by mouth as needed.     Calcium Carb-Cholecalciferol (CALCIUM/VITAMIN D PO) Take 1 tablet by mouth daily.     Cetirizine HCl (ZYRTEC PO) Take by mouth.     ESBRIET 801 MG TABS Take 1 tablet (801 mg total) by mouth 3 (three) times daily with meals. 270 tablet 2   fluorouracil (EFUDEX) 5 % cream Apply topically.     fluticasone (FLONASE) 50 MCG/ACT nasal spray Place into both nostrils  daily.     gabapentin (NEURONTIN) 300 MG capsule Take 300 mg by mouth 3 (three) times daily.     ipratropium (ATROVENT) 0.06 % nasal spray Place 2 sprays into both nostrils 3 (three) times daily.     loperamide (IMODIUM) 2 MG capsule Take 1 mg by mouth once.     meclizine (ANTIVERT) 25 MG tablet Take 25 mg by mouth 2 (two) times daily as needed.     Multiple Vitamin (MULTIVITAMIN) capsule Take 1 capsule by mouth every morning.     naproxen sodium (ALEVE) 220 MG tablet Take 220 mg by mouth daily as needed.     omeprazole (PRILOSEC) 40 MG capsule Take 40 mg by mouth daily.     PAROXETINE HCL PO Take by mouth.     predniSONE (DELTASONE) 10 MG  tablet 30mg  x 5 days; 20mg  x 5 days; 10mg  x 5 days; 5mg  x 5 days; then stop 35 tablet 0   rosuvastatin (CRESTOR) 5 MG tablet Take 1 tablet by mouth daily.     SUMAtriptan (IMITREX) 100 MG tablet TAKE ONE TABLET BY MOUTH AT ONSET OF HEADACHE; MAY REPEAT ONE TABLET IN 2 HOURS IF NEEDED. MAX OF 200 MG PER DAY     No current facility-administered medications for this visit.    Allergies  Allergen Reactions   Bee Venom Rash   Shellfish Allergy Hives   Nintedanib Other (See Comments)    SIRS, fevers   Pseudoephedrine Anxiety    Diagnoses:  Major depressive disorder, recurrent, moderate, irritability Plan of Care: I will meet with the patient every 2 to 3 weeks in person Target date: December 15, 2022 progress: 35%                                  French Ana, [facility]                  French Ana, [facility]               French Ana, [facility]               French Ana, [facility]               French Ana, [facility]               French Ana, [facility]               French Ana, [facility]               French Ana, [facility]

## 2022-11-06 ENCOUNTER — Encounter: Payer: Self-pay | Admitting: Internal Medicine

## 2022-11-07 NOTE — Telephone Encounter (Signed)
Ok to go back on esbriet starting fresh tritration all over  Itching - have no idea. Please talk to PCP [name], MD or derm

## 2022-11-13 ENCOUNTER — Encounter: Payer: Self-pay | Admitting: Behavioral Health

## 2022-11-13 ENCOUNTER — Ambulatory Visit (INDEPENDENT_AMBULATORY_CARE_PROVIDER_SITE_OTHER): Payer: Medicare Other | Admitting: Behavioral Health

## 2022-11-13 DIAGNOSIS — R454 Irritability and anger: Secondary | ICD-10-CM

## 2022-11-13 DIAGNOSIS — F411 Generalized anxiety disorder: Secondary | ICD-10-CM

## 2022-11-13 DIAGNOSIS — F331 Major depressive disorder, recurrent, moderate: Secondary | ICD-10-CM

## 2022-11-13 NOTE — Progress Notes (Signed)
[facility] Counselor Initial Adult Exam  Name: Dustin Dean Date: 11/13/2022 MRN: [id number] DOB: [birth year] PCP: [name], MD  Time spent: 48 minutes,   2 PM until 2:48 PM  spent in person with the patient in the outpatient therapist office. The patient said the weekend with his cousins went very well.  He enjoyed their time together and things went smoothly.  His sister did not ask a lot of questions but his 2 cousins did and he felt good about that.  His wife remarked on how positively he is handling his medical condition.  He acknowledges having good days and bad days but said he also recognizes that having a positive attitude makes a huge difference.  He is still trying to get answers for what is causing the itching.  He is pulmonologist putting back on the medication that he had taken him off of because it did not alleviate the itching.  He is speaking with his primary care doctor now and they are looking for answers.  In the past gabapentin helped relieve it for a few months and he is suggesting going up to the maximum dose on that with his primary care physician whom he trusts.  I encouraged him to reach out to his primary care doctor to make that suggestion.  He recognizes that his diagnosis is terminal and although he does not have a time frame he continues to make preparations as a way to take some of the pressure off of his wife.  He continues to approach this in a very positive manner  He and his wife are going to their adopted family's home tonight for his wife's birthday dinner and both find great enjoyment in time with that family.  He does contract for safety having no thoughts of hurting himself or anyone else.   Guardian/Payee: Self  Paperwork requested: Yes   Reason for Visit /Presenting Problem: Depression  . He does contract for safety having no thoughts of hurting himself or anyone else. Mental Status Exam: Appearance:   Fairly Groomed      Behavior:  Appropriate  Motor:  Normal  Speech/Language:   Clear and Coherent and Normal Rate  Affect:  Appropriate  Mood:  normal  Thought process:  normal  Thought content:    WNL  Sensory/Perceptual disturbances:    WNL  Orientation:  oriented to person, place, time/date, situation, and day of week  Attention:  Good  Concentration:  Good  Memory:  WNL  Fund of knowledge:   Good  Insight:    Good  Judgment:   Good  Impulse Control:  Good     Reported Symptoms: Depression  Risk Assessment: Danger to Self:  No Self-injurious Behavior: No Danger to Others: No Duty to Warn:no Physical Aggression / Violence:No  Access to Firearms a concern: No  Gang Involvement:No  Patient / guardian was educated about steps to take if suicide or homicide risk level increases between visits: n/a While future psychiatric events cannot be accurately predicted, the patient does not currently require acute inpatient psychiatric care and does not currently meet [facility] involuntary commitment criteria.  Substance Abuse History: Current substance abuse: No     Past Psychiatric History:   Previous psychological history is significant for depression Outpatient Providers: Primary care physician History of Psych Hospitalization:  None reported Psychological Testing:  n/a    Abuse History:  Victim of: No.,  None reported    Report needed: No. Victim of Neglect:No. Perpetrator of  n/a Witness / Exposure to Domestic Violence: None reported  Protective Services Involvement: No  Witness to [employer] Violence:  No   Family History:  Family History  Problem Relation Age of Onset   Cancer Mother    Congestive Heart Failure Father    Prostate cancer Father    Healthy Sister     Living situation: the patient lives with their spouse  Sexual Orientation: Straight  Relationship Status: married  Name of spouse / other: Did not discuss If a parent, number of children / ages:  Adults  Support Systems: spouse  Financial Stress:  No   Income/Employment/Disability: Doctor, general practice: No   Educational History: Education: high school diploma/GED  Religion/Sprituality/World View: Did not discuss  Any cultural differences that may affect / interfere with treatment:  not applicable   Recreation/Hobbies: Time with family  Stressors: Health problems    Strengths: Supportive Relationships, Hopefulness, Self Advocate, and Able to Communicate Effectively  Barriers:     Legal History: Pending legal issue / charges: The patient has no significant history of legal issues. History of legal issue / charges:  Not applicable  Medical History/Surgical History: reviewed Past Medical History:  Diagnosis Date   Aortic stenosis    Flu    Pneumonia    Prostate cancer (HCC)    Pulmonary fibrosis (HCC)    Skin cancer     Past Surgical History:  Procedure Laterality Date   HERNIA REPAIR     NASAL SEPTUM SURGERY     SKIN CANCER EXCISION      Medications: Current Outpatient Medications  Medication Sig Dispense Refill   acetaminophen (TYLENOL) 325 MG tablet Take by mouth as needed.     aspirin-acetaminophen-caffeine (EXCEDRIN MIGRAINE) 250-250-65 MG tablet Take by mouth as needed.     Calcium Carb-Cholecalciferol (CALCIUM/VITAMIN D PO) Take 1 tablet by mouth daily.     Cetirizine HCl (ZYRTEC PO) Take by mouth.     ESBRIET 801 MG TABS Take 1 tablet (801 mg total) by mouth 3 (three) times daily with meals. 270 tablet 2   fluorouracil (EFUDEX) 5 % cream Apply topically.     fluticasone (FLONASE) 50 MCG/ACT nasal spray Place into both nostrils daily.     gabapentin (NEURONTIN) 300 MG capsule Take 300 mg by mouth 3 (three) times daily.     ipratropium (ATROVENT) 0.06 % nasal spray Place 2 sprays into both nostrils 3 (three) times daily.     loperamide (IMODIUM) 2 MG capsule Take 1 mg by mouth once.     meclizine (ANTIVERT) 25 MG tablet  Take 25 mg by mouth 2 (two) times daily as needed.     Multiple Vitamin (MULTIVITAMIN) capsule Take 1 capsule by mouth every morning.     naproxen sodium (ALEVE) 220 MG tablet Take 220 mg by mouth daily as needed.     omeprazole (PRILOSEC) 40 MG capsule Take 40 mg by mouth daily.     PAROXETINE HCL PO Take by mouth.     predniSONE (DELTASONE) 10 MG tablet 30mg  x 5 days; 20mg  x 5 days; 10mg  x 5 days; 5mg  x 5 days; then stop 35 tablet 0   rosuvastatin (CRESTOR) 5 MG tablet Take 1 tablet by mouth daily.     SUMAtriptan (IMITREX) 100 MG tablet TAKE ONE TABLET BY MOUTH AT ONSET OF HEADACHE; MAY REPEAT ONE TABLET IN 2 HOURS IF NEEDED. MAX OF 200 MG PER DAY     No current facility-administered medications for this  visit.    Allergies  Allergen Reactions   Bee Venom Rash   Shellfish Allergy Hives   Nintedanib Other (See Comments)    SIRS, fevers   Pseudoephedrine Anxiety    Diagnoses:  Major depressive disorder, recurrent, moderate, irritability Plan of Care: I will meet with the patient every 2 to 3 weeks in person Target date: December 15, 2022 progress: 35%                                  [place], [facility]                  French Ana, [facility]               French Ana, [facility]               French Ana, [facility]               French Ana, [facility]               French Ana, [facility]               French Ana, [facility]               French Ana, [facility]               [place], [facility]

## 2022-12-03 ENCOUNTER — Ambulatory Visit: Payer: Medicare Other | Admitting: Behavioral Health

## 2022-12-03 ENCOUNTER — Encounter: Payer: Self-pay | Admitting: Behavioral Health

## 2022-12-03 DIAGNOSIS — R454 Irritability and anger: Secondary | ICD-10-CM

## 2022-12-03 DIAGNOSIS — F411 Generalized anxiety disorder: Secondary | ICD-10-CM

## 2022-12-03 DIAGNOSIS — F331 Major depressive disorder, recurrent, moderate: Secondary | ICD-10-CM

## 2022-12-03 NOTE — Progress Notes (Addendum)
[facility] Counselor Initial Adult Exam  Name: Jadeyn Remedios Date: 12/03/2022 MRN: [id number] DOB: Feb 13, 1950 PCP: [name], MD  Time spent: 57 minutes,  2 PM until 2:57 PM  spent in person with the patient in the outpatient therapist office.  The patient's primary care physician increased the amount of gabapentin he is taking and the itching has gotten better.  There is still room to move up on that dosage safely and he is going to request that to see if it further knocks out some of the itching.  The patient is honest and recognizing that his illness is not something that is going to get better.  He and his wife are doing all that they can to prepare her for when he can no longer do as much or when he passes away.  He and his wife are having very honest conversation about that.  He has decided that he wants a very simple [place] ceremony having someone pray to reach him scripture and he would like for the husband of his adopted family to do that.  He says that he can speak about it pretty openly but when they presented to the husband of the adoptive family he became tearful and they have not discussed at since.  He feels like that will come back around when the adopted husband is in a better place to speak.  He said that he first had the thought today that he is ready to die whenever God is ready for him to but he is not rushing that.  He said there was a peace that came along with that.  He talked about reaching out to people in his past who had a positive impact on his life and wondered if that would be okay.  I validated the fact that it would probably be good for both he and the people that he wants to reach out to and encouraged him to further consider doing that.  He does contract for safety having no thoughts of hurting himself or anyone else.   Guardian/Payee: Self  Paperwork requested: Yes   Reason for Visit /Presenting Problem: Depression  . He does contract  for safety having no thoughts of hurting himself or anyone else. Mental Status Exam: Appearance:   Fairly Groomed     Behavior:  Appropriate  Motor:  Normal  Speech/Language:   Clear and Coherent and Normal Rate  Affect:  Appropriate  Mood:  normal  Thought process:  normal  Thought content:    WNL  Sensory/Perceptual disturbances:    WNL  Orientation:  oriented to person, place, time/date, situation, and day of week  Attention:  Good  Concentration:  Good  Memory:  WNL  Fund of knowledge:   Good  Insight:    Good  Judgment:   Good  Impulse Control:  Good     Reported Symptoms: Depression  Risk Assessment: Danger to Self:  No Self-injurious Behavior: No Danger to Others: No Duty to Warn:no Physical Aggression / Violence:No  Access to Firearms a concern: No  Gang Involvement:No  Patient / guardian was educated about steps to take if suicide or homicide risk level increases between visits: n/a While future psychiatric events cannot be accurately predicted, the patient does not currently require acute inpatient psychiatric care and does not currently meet [facility] involuntary commitment criteria.  Substance Abuse History: Current substance abuse: No     Past Psychiatric History:   Previous psychological history is significant for depression  Outpatient Providers: Primary care physician History of Psych Hospitalization:  None reported Psychological Testing:  n/a    Abuse History:  Victim of: No.,  None reported    Report needed: No. Victim of Neglect:No. Perpetrator of n/a Witness / Exposure to Domestic Violence: None reported  Protective Services Involvement: No  Witness to [employer] Violence:  No   Family History:  Family History  Problem Relation Age of Onset   Cancer Mother    Congestive Heart Failure Father    Prostate cancer Father    Healthy Sister     Living situation: the patient lives with their spouse  Sexual Orientation:  Straight  Relationship Status: married  Name of spouse / other: Did not discuss If a parent, number of children / ages: Adults  Support Systems: spouse  Financial Stress:  No   Income/Employment/Disability: Doctor, general practice: No   Educational History: Education: high school diploma/GED  Religion/Sprituality/World View: Did not discuss  Any cultural differences that may affect / interfere with treatment:  not applicable   Recreation/Hobbies: Time with family  Stressors: Health problems    Strengths: Supportive Relationships, Hopefulness, Self Advocate, and Able to Communicate Effectively  Barriers:     Legal History: Pending legal issue / charges: The patient has no significant history of legal issues. History of legal issue / charges:  Not applicable  Medical History/Surgical History: reviewed Past Medical History:  Diagnosis Date   Aortic stenosis    Flu    Pneumonia    Prostate cancer (HCC)    Pulmonary fibrosis (HCC)    Skin cancer     Past Surgical History:  Procedure Laterality Date   HERNIA REPAIR     NASAL SEPTUM SURGERY     SKIN CANCER EXCISION      Medications: Current Outpatient Medications  Medication Sig Dispense Refill   acetaminophen (TYLENOL) 325 MG tablet Take by mouth as needed.     aspirin-acetaminophen-caffeine (EXCEDRIN MIGRAINE) 250-250-65 MG tablet Take by mouth as needed.     Calcium Carb-Cholecalciferol (CALCIUM/VITAMIN D PO) Take 1 tablet by mouth daily.     Cetirizine HCl (ZYRTEC PO) Take by mouth.     ESBRIET 801 MG TABS Take 1 tablet (801 mg total) by mouth 3 (three) times daily with meals. 270 tablet 2   fluorouracil (EFUDEX) 5 % cream Apply topically.     fluticasone (FLONASE) 50 MCG/ACT nasal spray Place into both nostrils daily.     gabapentin (NEURONTIN) 300 MG capsule Take 300 mg by mouth 3 (three) times daily.     ipratropium (ATROVENT) 0.06 % nasal spray Place 2 sprays into both nostrils 3  (three) times daily.     loperamide (IMODIUM) 2 MG capsule Take 1 mg by mouth once.     meclizine (ANTIVERT) 25 MG tablet Take 25 mg by mouth 2 (two) times daily as needed.     Multiple Vitamin (MULTIVITAMIN) capsule Take 1 capsule by mouth every morning.     naproxen sodium (ALEVE) 220 MG tablet Take 220 mg by mouth daily as needed.     omeprazole (PRILOSEC) 40 MG capsule Take 40 mg by mouth daily.     PAROXETINE HCL PO Take by mouth.     predniSONE (DELTASONE) 10 MG tablet 30mg  x 5 days; 20mg  x 5 days; 10mg  x 5 days; 5mg  x 5 days; then stop 35 tablet 0   rosuvastatin (CRESTOR) 5 MG tablet Take 1 tablet by mouth daily.  SUMAtriptan (IMITREX) 100 MG tablet TAKE ONE TABLET BY MOUTH AT ONSET OF HEADACHE; MAY REPEAT ONE TABLET IN 2 HOURS IF NEEDED. MAX OF 200 MG PER DAY     No current facility-administered medications for this visit.    Allergies  Allergen Reactions   Bee Venom Rash   Shellfish Allergy Hives   Nintedanib Other (See Comments)    SIRS, fevers   Pseudoephedrine Anxiety    Diagnoses:  Major depressive disorder, recurrent, moderate, irritability Plan of Care: I will meet with the patient every 2 to 3 weeks in person Treatment plan: We will use cognitive behavioral therapy, even though some therapy as well as elements of dialectical behavior therapy to help the patient reduce his anxiety by at least 50% with a target date of  October 30th 2024.  Goals will be to help the patient better manage anxiety symptoms and stress, identify causes for anxiety including changes in his health and ways to lower the anxiety, resolve the conflicts contributing to anxiety and help him manage thoughts and worrisome thinking contributing to his anxiety.  Interventions include providing education about anxiety to help him understand its causes symptoms and triggers..  We will facilitate problem solution skills as well as coping skills to help him manage anxiety and stress symptoms.  We will use  cognitive behavioral therapy to identify and change anxiety provoking thought and behavior patterns as well as dialectical therapy to teach distress tolerance and mindfulness skills. Progress: 35% We will continue with treatment goals as listed above with a new target date of May 15, 2023                                  [place], [facility]                  [place], [facility]               [place], [facility]               [place], [facility]               [place] Ana, [facility]               [place], [facility]               [place], [facility]               [place], [facility]               [place], [facility]               [place], [facility]

## 2022-12-06 ENCOUNTER — Ambulatory Visit (HOSPITAL_BASED_OUTPATIENT_CLINIC_OR_DEPARTMENT_OTHER)
Admission: RE | Admit: 2022-12-06 | Discharge: 2022-12-06 | Disposition: A | Discharge status: Home / Self Care | Payer: Medicare Other | Source: Ambulatory Visit | Attending: Primary Care | Admitting: Primary Care

## 2022-12-06 DIAGNOSIS — J849 Interstitial pulmonary disease, unspecified: Secondary | ICD-10-CM | POA: Diagnosis present

## 2022-12-17 ENCOUNTER — Other Ambulatory Visit: Payer: Self-pay | Admitting: *Deleted

## 2022-12-17 ENCOUNTER — Ambulatory Visit (INDEPENDENT_AMBULATORY_CARE_PROVIDER_SITE_OTHER): Payer: Medicare Other | Admitting: Behavioral Health

## 2022-12-17 ENCOUNTER — Encounter: Payer: Self-pay | Admitting: Behavioral Health

## 2022-12-17 DIAGNOSIS — F331 Major depressive disorder, recurrent, moderate: Secondary | ICD-10-CM

## 2022-12-17 DIAGNOSIS — R454 Irritability and anger: Secondary | ICD-10-CM

## 2022-12-17 DIAGNOSIS — F411 Generalized anxiety disorder: Secondary | ICD-10-CM

## 2022-12-17 DIAGNOSIS — J849 Interstitial pulmonary disease, unspecified: Secondary | ICD-10-CM

## 2022-12-17 NOTE — Progress Notes (Signed)
[facility] Counselor Initial Adult Exam  Name: Dustin Dean Date: 12/17/2022 MRN: [id number] DOB: [birth year] PCP: [name], MD  Time spent: 53 minutes,  3 PM until 3:53 PM  spent in person with the patient in the outpatient therapist office. The patient had a good time with friends on Thanksgiving and then with family a few days after Thanksgiving day.  He said it was good to spend time with friends and family and was very stress-free.  He also got to see his adoptive family and spent time with them.  He has another test tomorrow and then will meet with his pulmonologist in a couple of weeks.  He is hopeful to get some answers as to help the diagnosis is progressing.  He says is not more but is more about just having a basic time frame in mind as a way to prepare himself.  He acknowledges being sad but also says that he can talk about it very openly and even more so if he has a better feel for a timeline.  We talked about things that he does now.  He reached out to someone that he had been friends with him clarification and said that brought some peace.  He is composing letters to a former boss and others and he wants to said to them with no expectation of reply.  We talked about what his thoughts and feelings are in terms of thinking about no matter how long it is what that would look like and feel like for him in terms of living like fully here but also based on his faith what afterlife/heaven will be like. He does contract for safety having no thoughts of hurting himself or anyone else.   Guardian/Payee: Self  Paperwork requested: Yes   Reason for Visit /Presenting Problem: Depression  . He does contract for safety having no thoughts of hurting himself or anyone else. Mental Status Exam: Appearance:   Fairly Groomed     Behavior:  Appropriate  Motor:  Normal  Speech/Language:   Clear and Coherent and Normal Rate  Affect:  Appropriate  Mood:  normal  Thought  process:  normal  Thought content:    WNL  Sensory/Perceptual disturbances:    WNL  Orientation:  oriented to person, place, time/date, situation, and day of week  Attention:  Good  Concentration:  Good  Memory:  WNL  Fund of knowledge:   Good  Insight:    Good  Judgment:   Good  Impulse Control:  Good     Reported Symptoms: Depression  Risk Assessment: Danger to Self:  No Self-injurious Behavior: No Danger to Others: No Duty to Warn:no Physical Aggression / Violence:No  Access to Firearms a concern: No  Gang Involvement:No  Patient / guardian was educated about steps to take if suicide or homicide risk level increases between visits: n/a While future psychiatric events cannot be accurately predicted, the patient does not currently require acute inpatient psychiatric care and does not currently meet [facility] involuntary commitment criteria.  Substance Abuse History: Current substance abuse: No     Past Psychiatric History:   Previous psychological history is significant for depression Outpatient Providers: Primary care physician History of Psych Hospitalization:  None reported Psychological Testing:  n/a    Abuse History:  Victim of: No.,  None reported    Report needed: No. Victim of Neglect:No. Perpetrator of n/a Witness / Exposure to Domestic Violence: None reported  Protective Services Involvement: No  Witness to  Community Violence:  No   Family History:  Family History  Problem Relation Age of Onset   Cancer Mother    Congestive Heart Failure Father    Prostate cancer Father    Healthy Sister     Living situation: the patient lives with their spouse  Sexual Orientation: Straight  Relationship Status: married  Name of spouse / other: Did not discuss If a parent, number of children / ages: Adults  Support Systems: spouse  Financial Stress:  No   Income/Employment/Disability: Doctor, general practice: No   Educational  History: Education: high school diploma/GED  Religion/Sprituality/World View: Did not discuss  Any cultural differences that may affect / interfere with treatment:  not applicable   Recreation/Hobbies: Time with family  Stressors: Health problems    Strengths: Supportive Relationships, Hopefulness, Self Advocate, and Able to Communicate Effectively  Barriers:     Legal History: Pending legal issue / charges: The patient has no significant history of legal issues. History of legal issue / charges:  Not applicable  Medical History/Surgical History: reviewed Past Medical History:  Diagnosis Date   Aortic stenosis    Flu    Pneumonia    Prostate cancer (HCC)    Pulmonary fibrosis (HCC)    Skin cancer     Past Surgical History:  Procedure Laterality Date   HERNIA REPAIR     NASAL SEPTUM SURGERY     SKIN CANCER EXCISION      Medications: Current Outpatient Medications  Medication Sig Dispense Refill   acetaminophen (TYLENOL) 325 MG tablet Take by mouth as needed.     aspirin-acetaminophen-caffeine (EXCEDRIN MIGRAINE) 250-250-65 MG tablet Take by mouth as needed.     Calcium Carb-Cholecalciferol (CALCIUM/VITAMIN D PO) Take 1 tablet by mouth daily.     Cetirizine HCl (ZYRTEC PO) Take by mouth.     ESBRIET 801 MG TABS Take 1 tablet (801 mg total) by mouth 3 (three) times daily with meals. 270 tablet 2   fluorouracil (EFUDEX) 5 % cream Apply topically.     fluticasone (FLONASE) 50 MCG/ACT nasal spray Place into both nostrils daily.     gabapentin (NEURONTIN) 300 MG capsule Take 300 mg by mouth 3 (three) times daily.     ipratropium (ATROVENT) 0.06 % nasal spray Place 2 sprays into both nostrils 3 (three) times daily.     loperamide (IMODIUM) 2 MG capsule Take 1 mg by mouth once.     meclizine (ANTIVERT) 25 MG tablet Take 25 mg by mouth 2 (two) times daily as needed.     Multiple Vitamin (MULTIVITAMIN) capsule Take 1 capsule by mouth every morning.     naproxen sodium  (ALEVE) 220 MG tablet Take 220 mg by mouth daily as needed.     omeprazole (PRILOSEC) 40 MG capsule Take 40 mg by mouth daily.     PAROXETINE HCL PO Take by mouth.     predniSONE (DELTASONE) 10 MG tablet 30mg  x 5 days; 20mg  x 5 days; 10mg  x 5 days; 5mg  x 5 days; then stop 35 tablet 0   rosuvastatin (CRESTOR) 5 MG tablet Take 1 tablet by mouth daily.     SUMAtriptan (IMITREX) 100 MG tablet TAKE ONE TABLET BY MOUTH AT ONSET OF HEADACHE; MAY REPEAT ONE TABLET IN 2 HOURS IF NEEDED. MAX OF 200 MG PER DAY     No current facility-administered medications for this visit.    Allergies  Allergen Reactions   Bee Venom Rash   Shellfish Allergy  Hives   Nintedanib Other (See Comments)    SIRS, fevers   Pseudoephedrine Anxiety    Diagnoses:  Major depressive disorder, recurrent, moderate, irritability Plan of Care: I will meet with the patient every 2 to 3 weeks in person Treatment plan: We will use cognitive behavioral therapy, even though some therapy as well as elements of dialectical behavior therapy to help the patient reduce his anxiety by at least 50% with a target date of  October 30th 2024.  Goals will be to help the patient better manage anxiety symptoms and stress, identify causes for anxiety including changes in his health and ways to lower the anxiety, resolve the conflicts contributing to anxiety and help him manage thoughts and worrisome thinking contributing to his anxiety.  Interventions include providing education about anxiety to help him understand its causes symptoms and triggers..  We will facilitate problem solution skills as well as coping skills to help him manage anxiety and stress symptoms.  We will use cognitive behavioral therapy to identify and change anxiety provoking thought and behavior patterns as well as dialectical therapy to teach distress tolerance and mindfulness skills. Progress: 35% We will continue with treatment goals as listed above with a new target date of May 15, 2023                                  [place], [facility]                  [place], [facility]               [place], [facility]               [place], [facility]               [place], [facility]               [place], [facility]               [place], [facility]               [place], [facility]               [place], [facility]               [place], [facility]               [place], [facility]

## 2022-12-18 ENCOUNTER — Ambulatory Visit: Payer: Medicare Other | Admitting: Internal Medicine

## 2022-12-18 DIAGNOSIS — J849 Interstitial pulmonary disease, unspecified: Secondary | ICD-10-CM

## 2022-12-18 LAB — PULMONARY FUNCTION TEST
DL/VA % pred: 72 %
DL/VA: 2.97 ml/min/mmHg/L
DLCO cor % pred: 41 %
DLCO cor: 10.2 ml/min/mmHg
DLCO unc % pred: 41 %
DLCO unc: 10.2 ml/min/mmHg
FEF 25-75 Pre: 2.26 L/s
FEF2575-%Pred-Pre: 98 %
FEV1-%Pred-Pre: 49 %
FEV1-Pre: 1.5 L
FEV1FVC-%Pred-Pre: 120 %
FEV6-%Pred-Pre: 43 %
FEV6-Pre: 1.69 L
FEV6FVC-%Pred-Pre: 106 %
FVC-%Pred-Pre: 41 %
FVC-Pre: 1.69 L
Pre FEV1/FVC ratio: 89 %
Pre FEV6/FVC Ratio: 100 %

## 2022-12-18 NOTE — Patient Instructions (Signed)
Spirometry/DLCO performed today. 

## 2022-12-18 NOTE — Progress Notes (Signed)
Spirometry/DLCO performed today. 

## 2022-12-20 ENCOUNTER — Ambulatory Visit: Payer: Medicare Other | Admitting: Internal Medicine

## 2022-12-20 ENCOUNTER — Encounter: Payer: Self-pay | Admitting: Internal Medicine

## 2022-12-20 VITALS — BP 110/70 | HR 83 | Ht 68.0 in | Wt 206.0 lb

## 2022-12-20 DIAGNOSIS — Z7189 Other specified counseling: Secondary | ICD-10-CM

## 2022-12-20 DIAGNOSIS — I272 Pulmonary hypertension, unspecified: Secondary | ICD-10-CM | POA: Diagnosis not present

## 2022-12-20 DIAGNOSIS — J9611 Chronic respiratory failure with hypoxia: Secondary | ICD-10-CM

## 2022-12-20 DIAGNOSIS — J849 Interstitial pulmonary disease, unspecified: Secondary | ICD-10-CM

## 2022-12-20 NOTE — Patient Instructions (Addendum)
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 16% decline in lung function 03/08/2022-> Now - risk for death is 10X higher in January 06, 2024 - now 87% RA at rest but now needing 5L [employer] at all times - noted tyvaso intolerance   Plan (shared decision making) - continue sildenafil --Continue pirfenidone 801mg  x 3 times daily  - take with food  - space 5-6 hours apart  - apply sunscreen  - wear hat  - hydrate well   - do LFT 12/20/2022 (last sept 01/06/23)  - discussed transplant, chronic prednisone and cellcept  - respect refusal  - discussed life expectancy and goals of care and hospice  - recommend MOST Form. No CPR . No inutbation   - take sample of MOST FORM   - ok for home palliative care when ready  - spirometry but NO DLCO in 12 weeks   Follow-up  - 12  weeks 30-minute visit with Dr. Marchelle Gearing but after spirometry and DLCO.

## 2022-12-20 NOTE — Progress Notes (Signed)
Dustin Dean      Cardiology Dustin Dean 02/15/20 Dustin Dean is 72 y.o. year old male with a history of No prior cardiovascular disease who presents to [facility] health cardiology for the evaluation of a heart murmur. This patient is a non-smoker and denies a previous history of myocardial infarction, congestive heart failure, or valvular heart disease. 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 He really has little in the way of exertional chest symptoms. Occasionally according to the wife if he walks up an incline briskly he may have some minor shortness of breath but remains active and has little in the way limitation. He denies a previous history of myocardial infarction, congestive heart failure, or known valvular heart disease. At a recent examination he was found to have a heart murmur and an echocardiogram was performed. It demonstrated a peak gradient across his aortic valve of 30 mmHg and an aortic valve area of 1.1 cm. This was read as being consistent with moderate aortic stenosis. I had a fairly significant discussion with the patient and his wife about the implications of his aortic valve and the need to follow the aortic stenosis progression over time.  11/14/2020 office visit with Dustin Dean pulmonary physician assistant at [facility] has past medical history of restrictive lung disease, interstitial lung disease, allergic rhinitis, laryngeal pharyngeal reflux, arthritis, lung nodules and persistent cough. He is a non-smoker and has never smoked. He has medication allergy to pseudoephedrine.  Dustin Dean presents today with complaint of a progressively worsening shortness of breath and dyspnea on exertion over the past several months. We have been following his interstitial lung disease since 2018. He has been stable without complaint of dyspnea on exertion over the past several years. He indicates that recently he has had more  shortness of breath with exertion. He and his wife will walk the dog in their usual route through the neighborhood that they have done for many years. He reports that he will get short of breath along the root in a way that he has never been bothered with previously. He has recently requested an albuterol inhaler and reports that it has been helpful. He has been using Symbicort 160/4.5 twice daily but has been having difficulty using it successfully as he has been coughing significantly and loses a lot of the medication. A recent chest CT conducted 10/24/2020 notes a progression in the interstitial changes. A 9 mm right lower lobe nodule is also noted. The plan is to have a 64-month follow-up chest CT conducted 02/04/2021. A 6-minute walk test conducted today shows oxygen desaturation and need for supplemental oxygen. I will place the order for supplemental oxygen. I will also refer him to the interstitial lung clinic at [facility] for further evaluation and treatment.   Patient at rest on RA with baseline sats : 97% After exertion for 3 min on RA pt's sats were : 86% Patient placed on oxygen via nasal cannula at 2 lpm flow via portable oxygen concentrator device Exertion for 6 mins with oxygen confirmed final sats at 94%   #1 lung nodules Chest CT 10/24/2020 shows 9 mm right lower lobe lung nodule Follow-up chest CT scheduled for 100-month reevaluation in January 2023  2. Interstitial lung disease Chest CT 11/04/2020 notes fibrotic interstitial lung disease with evidence of progression compared to 08/28/2020 Referral to interstitial lung clinic at [facility] for further evaluation and treatment   #  Primary Care dec 2022 Osteopenia Due for DXA. Prostate cancer Saw Dr. Marlou Dean at [facility]. Biopsy + for cancer. They are just watching it. He is going to have MRI and another biopsy next visit.   OV 01/24/2021 -evaluation and transfer of care to Dr. Marchelle Dean in ILD center in [place].   Referred by Dustin Dean patient support group leader for the pulmonary fibrosis foundation support group  Subjective:  Patient ID: Dustin Dean, male , DOB: [birth year] , age 89 y.o. , MRN: [id number] , ADDRESS: [address] [place] [address] PCP Dustin Canterbury, Dean Patient Care Team: Dustin Canterbury, Dean as PCP - General (Family Medicine)  This Provider for this visit: Treatment Team:  Attending Provider: Kalman Shan, Dean    01/24/2021 -   Chief Complaint  Patient presents with   Consult    Pt is here for a switch of providers to take over care for his pulmonary fibrosis. 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 Pt does become SOB with activities and also states that he does have a chronic cough.     HPI  Dustin Dean 72 y.o. -new evaluation for interstitial lung disease.  History is provided by review of the chart and also talking to the patient and his wife.  He tells me that he has had chronic cough since his teenage years.  But for the last few to several years its been more consistent.  He was seeing the physician assistant at [facility].  Somewhere along the way he got referred to Dr. Providence Dean right at [facility] some 3 years ago.  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  After that cough is largely improved except for occasional exacerbations in the fall in the winter.  Then in 2018 sometime after he went on gabapentin he was informed by the physician assistant that he might have early ILD.  He does not recollect any serology or biopsy being done.  He does recollect a few pulmonary function test.  Then all of a sudden starting September 2022 his cough "got out of hand" and also developed worsening shortness of breath.  This the first time he started developing worsening shortness of breath.  Then by November 2022 started needing exertional oxygen leading to current symptoms.  He was referred to [facility] but he preferred to  establish with the ILD center here in [place] with [place] based on [facility] recommendation.  He found Dustin Dean through Internet search for pulmonary fibrosis foundation.  His current symptom score is below.  He is not on any antifibrotic.  [facility] Comprehensive ILD Questionnaire       Past Medical History :  -He has obesity BMI 33-34 - Denies any collagen vascular disease - He is known to have aortic stenosis -sees cardiologist at [facility].  December 2021 echocardiogram EF 55 to 60% with tricuspid aortic valve and moderate stenosis normal right ventricle -Denies any asthma or COPD.  Denies any heart failure denies any collagen vascular disease -Denies snoring or excessive daytime somnolence.  Denies stroke.  Denies formal diagnosis of sleep apnea denies formal diagnosis of pulmonary hypertension [echo a year ago did not show pulmonary hypertension] -Has had vaccination against COVID but has not had COVID disease -Recent diagnosis of late 2022 early stage prostate cancer at [facility] urology on observation treatment -Has history of skin cancer for which he apply sunscreen regularly  ROS:  -Shortness of breath present  Cough chronic plus -Has intermittent chronic diarrhea and takes Imodium but no other GI side effects -Possible Raynaud's for the last several decades  FAMILY HISTORY of LUNG DISEASE:  Denies family for pulmonary fibrosis.  Denies family Struve COPD or asthma sarcoid or cystic fibrosis.  Denies family history of hypersensitive pneumonitis.  Denies autoimmune disease.  Denies any premature graying of the hair or albinism  PERSONAL EXPOSURE HISTORY:  No prior cigarette smoking -No marijuana use of cocaine use no intravenous drug use.  HOME  EXPOSURE and HOBBY DETAILS :  -Single-family home in a one-story space.  [place] setting age of the current home is 31 years.  He is lived there for 6 years.  He does use a feather pillow and this occasionally mold or  mildew in the bathroom.  All his life he is done active gardening with mulch and woodchips and occasionally damp soil.  He is worked out in the yard quite a bit and does gardening.  He enjoys the gardening.  OCCUPATIONAL HISTORY (122 questions) : -He worked in the [employer] at [employer] and then [employer] no [employer].  He does not remember if the buildings had mold in it.  He does not think so -Detail greater than 100 question organic and inorganic antigen history exposure is negative  PULMONARY TOXICITY HISTORY (27 items):  Denies  INVESTIGATIONS: Report of pulmonary function test but I am not able to get it in Care Everywhere       High-resolution CT scan of the chest August 2020  -There is a report of honeycombing but without any craniocaudal gradient.  Air-trapping reported alternative pattern more suggestive of chronic hypersensitive pneumonitis.  But there is already progression from August 2019 -Read by Dr. Trudie Reed   CT Chest data- HRCT 11/04/20 unable for my visualization.   CLINICAL DATA:  Worsening shortness of breath   EXAM:  CT CHEST WITHOUT CONTRAST   TECHNIQUE:  Multidetector CT imaging of the chest was performed following the  standard protocol without intravenous contrast. High resolution  imaging of the lungs, as well as inspiratory and expiratory imaging,  was performed.   COMPARISON:  Chest CT dated August 29, 2018   FINDINGS:  Cardiovascular: Normal heart size. No pericardial effusion. Coronary  artery calcifications of the circumflex. Aortic valve  calcifications. Minimal calcified plaque of the thoracic aorta.   Mediastinum/Nodes: Mildly enlarged mediastinal lymph nodes,  unchanged compared to prior exam and likely reactive. Esophagus and  thyroid are unremarkable.   Lungs/Pleura: Central airways are patent. Bilateral air trapping.  Peribronchovascular and subpleural reticular opacities with traction   bronchiectasis and no clear craniocaudal predominance. Honeycomb  change primarily seen in the anterior upper lobes is increased when  compared to prior exam. New linear nodular opacity of the right  lower lobe measuring up to 9 mm on series 4, image 210.   Upper Abdomen: No acute abnormality.   Musculoskeletal: No chest wall mass or suspicious bone lesions  identified.   IMPRESSION:  Fibrotic interstitial lung disease with evidence of progression when  compared with August 29, 2018 prior exam. Favor fibrotic  hypersensitivity pneumonitis given presence of air trapping.  Findings are suggestive of an alternative diagnosis (not UIP) per  consensus guidelines: Diagnosis of Idiopathic Pulmonary Fibrosis: An  Official ATS/ERS/JRS/ALAT Clinical Practice Guideline. Am Rosezetta Schlatter  Crit Care Med Vol 198, Iss 5, [phone], Sep 15 2016.   New linear nodular opacity of the right lower lobe measuring  up to 9  mm, likely an area of focal fibrosis. Recommend follow-up chest CT  in 3 months to ensure stability.   Aortic Atherosclerosis (ICD10-I70.0).   Addendum 01/26/2021 -radiology Dr. Llana Aliment wrote back saying CT scan is classic for chronic HP.  We will call the patient and get the patient started on nintedanib counseling which is first-line for progressive non-- IPF ILD.  If he is reluctant then we will do pirfenidone.  We will set up pharmacy counseling.  If he is reluctant for nintedanib based on risk versus benefit we will do pirfenidone  Electronically Signed    By: Allegra Lai M.D.    On: 11/04/2020 14:17  No results found.   Phone visit 01/28/21  Emilyh  LEt Dustin Dean know ahead of the visit followup with me on 02/23/21   A) making referral to [facility] to discuss ofev which would be first line baed on what radiologist told me about CT and fact rheumaotid facot is positive  B) rheumatoid factor strongly positive - refer rheumatology Dr Deveshwar/Rice  C) I reviewed echo report  (below) Will discuss at followup  Aortic Valve: There is moderate stenosis, with peak and mean gradients  of 44.000 and 27.000 mmHg.  Aortic valve area calculates to approximately  1.4 cm    Mitral Valve: There is mild regurgitation.    Tricuspid Valve: There is mild regurgitation.    Tricuspid Valve: The right ventricular systolic pressure is normal (<36  mmHg).   1.  Adequate 2D M-mode and color flow Doppler echocardiogram demonstrates  normal left ventricular chamber size and contractility.  There may be mild  left ventricular hypertrophy.  Ejection fraction is estimated 65%  2.  The aortic valve is thickened and demonstrates reduced leaflet  mobility.  There is mild to moderate aortic stenosis with aortic valve  area calculating to 1.4 cm  3.  Mild mitral annular calcification is noted but there is good leaflet  mobility.  Mild mitral insufficiency is noted  4.  Tricuspid valve appears to be normal  5.  The atria are grossly normal size bilaterally  6.  The right ventricular chamber size and function is normal  6.  The pericardium is of normal thickness there is no effusion  OV 02/23/2021  Subjective:  Patient ID: Dustin Dean, male , DOB: 03/30/1950 , age 12 y.o. , MRN: [id number] , ADDRESS: [address] [place] [address] PCP Dustin Canterbury, Dean Patient Care Team: Dustin Canterbury, Dean as PCP - General (Family Medicine)  This Provider for this visit: Treatment Team:  Attending Provider: Kalman Shan, Dean    02/23/2021 -   Chief Complaint  Patient presents with   Follow-up    Pt recently had a HRCT and PFT and is here today to discuss the results.   Chronic cough on gabapentin  Interstitial lung disease work-up in progress -concern for hypersensitive pneumonitis  -Progressive phenotype between August 2020 and October 2022  9 mm right lower lobe nodule seen in October 2022 for the first time: Stable January 2023  HPI Dustin Dean 72 y.o. -since his last  visit Dr. Llana Aliment the radiologist did look at the CT scan and this confirmed his CT is not consistent with UIP but more likely an alternate pattern consistent with hypersensitive pneumonitis as seen by air trapping.  Patient has had serology work-up in which it is all negative except rheumatoid factor strongly positive.  He is yet to see the rheumatologist.  He does have  significant restrictions on the PFTs with reduced DLCO consistent with his obesity and ILD.  His symptoms are overall stable.  Did go through his exposure history again.  He says in 200 07/2006 and again in 2012 he had had 3 episodes of pneumonia.  All of this happened when he entered school buildings.  He was living in the same house at that time but the house itself was brand-new and there is no mold or mildew.  He is wondering about crawlspace because only [place] he has seen crawlspace but he said he has never been in the crawl space.  He does not know if there was mold in the crawl space.  I told him it is doubtful that mold or mildew from the crawlspace can get into the house.  He did have a feather pillow but since his last visit he is thrown that out.  He does do gardening and does get exposed to dampness.  At this point in time did discuss with him that he does have ILD and it is progressive.  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  Unclear if the positive rheumatoid factor is playing a role it could be if he has positive for rheumatoid arthritis.  Did indicate that ultimately only a biopsy can put to rest if he has IPF [being Caucasian gentleman greater than 65 is classic phenotype for IPF] but did indicate to him that given progression does not matter whether it is IPF or non-IPF antifibrotic's is indicated.  He is already met with the pharmacist and discussed the 2 antifibrotic's.  Did indicate to him that nintedanib is first-line and non-- IPF progressive phenotype.  Did discuss about the diarrhea.   Denies any contraindication.  Therefore we will start this.  Did discuss whether diarrhea is willing to go through this.  Also discussed about ILD-Pro registry.  He is interested given the consent form.  Did indicate to him that it probably would be beneficial to do some kind of a limited work-up in differentiating his etiology.  The reasons would be to participate in clinical trials but also in case he needs immunosuppression such as prednisone or CellCept [indicated for hypersensitive pneumonitis but not for IPF] having a specific diagnosis would be beneficial.  I did think that his obesity puts him at some risk for getting surgical lung biopsy and therefore it might be better to start off with bronchoscopy with lavage and transbronchial biopsies.  We can address this in the future.  This visit was just focused on therapies.  Regarding his 9 mm lower lobe nodule: He had a CT scan of the chest and shows this is stable from October 2022 through January 2023.  The CT scan again reads as alternative pattern consistent with chronic HP.    CT Chest data January 2023  No results found.  Mediastinum/Nodes: Multiple prominent borderline but nonenlarged mediastinal and bilateral hilar lymph nodes, similar to the prior study esophagus is unremarkable in appearance. No axillary lymphadenopathy.   Lungs/Pleura: High-resolution images again demonstrate widespread but patchy areas of ground-glass attenuation, septal thickening, subpleural reticulation, thickening of the peribronchovascular interstitium, cylindrical and varicose traction bronchiectasis, peripheral bronchiolectasis and extensive honeycombing. There is no discernible craniocaudal gradient. The most extensive areas of honeycombing are anterior lung, predominantly in the upper lobes and right middle lobe. Some areas of the lung bases demonstrate relative sparing. Inspiratory and expiratory imaging demonstrates mild to moderate air trapping  indicative of small airways disease. No definite  progression compared to the recent prior study. No acute consolidative airspace disease. No pleural effusions. Previously described nodular area of architectural distortion in the right lower lobe (axial image 106 of series 8) is stable measuring 8 mm on today's examination, likely part of the underlying fibrosis. No other definite suspicious appearing pulmonary nodules or masses are noted.   Upper Abdomen: Unremarkable.   Musculoskeletal: There are no aggressive appearing lytic or blastic lesions noted in the visualized portions of the skeleton.   IMPRESSION: 1. 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 2. Previously noted nodular area of architectural distortion in the right lower lobe is unchanged, likely part of the fibrosis rather than a pulmonary nodule. 3. There are calcifications of the aortic valve and mitral annulus. Echocardiographic correlation for evaluation of potential valvular dysfunction may be warranted if clinically indicated. 4. Aortic atherosclerosis, in addition to 2 vessel coronary artery disease. Please note that although the presence of coronary artery calcium documents the presence of coronary artery disease, the severity of this disease and any potential stenosis cannot be assessed on this non-gated CT examination. Assessment for potential risk factor modification, dietary therapy or pharmacologic therapy may be warranted, if clinically indicated.   Aortic Atherosclerosis (ICD10-I70.0).     Electronically Signed   By: Trudie Reed M.D.   On: 02/14/2021 06:17 stable January 2023  PFT       Latest Reference Range &  Units 01/27/21 15:53 01/27/21 15:54  Anti Nuclear Antibody (ANA) NEGATIVE   NEGATIVE  Angiotensin-Converting Enzyme 9 - 67 U/L  49  Anti JO-1 0.0 - 0.9 AI  <0.2  Cyclic Citrullin Peptide Ab UNITS  <16  ds DNA Ab IU/mL  1  ENA RNP Ab 0.0 - 0.9 AI  0.4  RA Latex Turbid. <14 IU/mL  323 (H)  SSA (Ro) (ENA) Antibody, IgG <1.0 NEG AI  <1.0 NEG  SSB (La) (ENA) Antibody, IgG <1.0 NEG AI  <1.0 NEG  Scleroderma (Scl-70) (ENA) Antibody, IgG <1.0 NEG AI  <1.0 NEG  QUANTIFERON-TB GOLD PLUS   Rpt  A.Fumigatus #1 Abs Negative  Negative   Micropolyspora faeni, IgG Negative  Negative   Thermoactinomyces vulgaris, IgG Negative  Negative   A. Pullulans Abs Negative  Negative   Thermoact. Saccharii Negative  Negative   Pigeon Serum Abs Negative  Negative   (H): Data is abnormally high     OV 03/23/2021- acute video visit due to concerns for acute symptoms ? Ofev side effect  Subjective:  Patient ID: Dustin Dean, male , DOB: Jun 13, 1950 , age 10 y.o. , MRN: [id number] , ADDRESS: [address] [place] [address] PCP Dustin Canterbury, Dean Patient Care Team: Dustin Canterbury, Dean as PCP - General (Family Medicine)  This Provider for this visit: Treatment Team:  Attending Provider: Kalman Shan, Dean  Type of visit: Video Circumstance: COVID-19 national emergency Identification of patient Dustin Dean with 01-06-1951 and MRN [id number] - 2 person identifier Risks: Risks, benefits, limitations of telephone visit explained. Patient understood and verbalized agreement to proceed Anyone else on call: just patient Patient location: Inpatient bed at [facility] which is another health system This provider location: [address] pulmonary office [address]  [place], [place] [address].   03/23/2021 -in this video visit was set up  on acute basis because he started having symptoms after starting nintedanib.  He wanted to know if it was nintedanib symptoms.  The symptoms sound rather complex  and little bit out of proportion for nintedanib.  Therefore I scheduled this video visit and to my surprise he is sitting in a hospital bed at [facility] hospital.    HPI Dustin Dean 72 y.o. - started ofev mid-feb 2023. Started noticing feeling disoriented and at times weak. Then on 3/4-03/19/21 was feeling very weak whole bidy weakness and needed walker to even go across room. Tired. Stopped going to gym. Was using o2 all the time. Then on 03/21/21 feelign cold. ANd ahd fever  102F.  Does not remember Tuesday much. Initialyt Dx is pneumonia and testing/imaging -> by tthen pccm saw him Dr Eual Fines. Procal negative. WBC normal. Concerns is ILD flare up and recommendation is prednisone.  CT scan did not show pneumonia Curently on IV solumedrol. Currently some better. Yesterday ate well. Currently on 2.5LN . He has been covid negative. Multiplex resp virus panel negative per outside chart revie  With ofev was having some nausea. No abd pain. No diarrhea. No stomach cramps. No vomitting   Currently ofev on hold    03/28/2021 Follow up : ILD , Post hospital follow up  Patient returns for a 1 week follow-up visit.  Patient was seen in January 2023 for second opinion for interstitial lung disease.  Patient was felt to have possible concern for hypersensitivity pneumonitis.  He has had a progressive phenotype noted on CT scan from August 2020 to October 2022.  Serology was negative except for a strongly positive rheumatoid factor.  He was referred to rheumatology.  PFT showed significant restriction and reduced DLCO.  Has a history of recurrent pneumonia.  He was recommended to begin antifibrotic's. With OFEV.  Patient was hospitalized last week with severe dyspnea, weakness, activity intolerance to point he could not walk. Confusion, low oxygen levels, and fever-tmax 102. Lorie Phenix he has a viral illness and ILD flare.  Patient was hospitalized at an outlying hospital.  He was treated with steroids.   Discharged on a steroid taper. CT chest 03/22/21 report showed fibrotic ILD, stable since 10/2020, no acute consolidation.  Covid , Influezna  and Viral panel were neg.  Since getting out of the hospital patient is feeling much better. Energy level has picked, decreased oxygen demands, no fever. Appetite is some better . Patient is concerned this could have been from [facility] as his symptoms started when he started OFEV.  Is on Oxygen with activity and At bedtime  2l/m . At rest O2 sats are >90%.      MDD 03/28/21 MDD Impression/Recs: Recent flare up. Bx can be prohibitive. get rheum eval and if neg - manage as HP. Maybe at the most BAL if stable  OV 04/05/2021  Subjective:  Patient ID: Dustin Dean, male , DOB: 1950-02-26 , age 10 y.o. , MRN: [id number] , ADDRESS: [address] [place] [address] PCP Dustin Canterbury, Dean Patient Care Team: Dustin Canterbury, Dean as PCP - General (Family Medicine)  This Provider for this visit: Treatment Team:  Attending Provider: Kalman Shan, Dean    04/05/2021 -   Chief Complaint  Patient presents with   Follow-up    Pt states he is feeling better after last hospital stay and states each day is getting better.   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 -returns for followup. He is on 1.5 tab of pred curently . Next week is 1 tab pred and off. He is better. Feeling better. AT rest not using o2 because he is better. In fact symp score shows huge imrpovement. Walking desat test is also largey imprpvd. All ? Steroid effects. We had conversation about his recent flare up  - has hx of recurrent pna: 2005-2006 x 2 episodes. . Lived  in a different house. Then again pneumonia 5 years ago in same house. Rx in urgent care. Had high fever. Current started abruptly 2 weeks into ofev andhe wondering if due to ofev. Did not have classic Ofev sid effects. Mentioned to him Ofev reduces riks of flare.  - gardening: no longer gardening  - featherpillow - got rid of it even after last visit   -visible mold - denies but he discussed his crawl space. Says "there is no mold bu fungs on the top wall". 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 Biopsy indicated if stable but first rheum consult    04/26/2021- Interim hx  Patient presents today for ILD follow-up. He has been on OFEV for three weeks, currently taking 150mg  twice daily. He has been off oral prednisone for two weeks. Breathing is not significantly worse off steroids. Cough is the same. Since Saturday he developed chills, fatigue and low grade fever at night. Temp 100.1-100.3. He reprots some heavy breathing. Oxygen 92% or higher. Urinary frequency. Having some incontinence. Continues on Gabapentin for chronic cough. Referred to rheumatology, has an apt beginning of June. He has been avoid exposures mold/mildew, gardening.     05/02/2021 Follow up : ILD , Fever  Patient presents for an acute office visit.  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  Serial CT chest from August 2020 to August 2022 showed progressive changes.  Serology was negative except for a positive rheumatoid factor.  He has been referred to rheumatology with consult pending.  Pulmonary function testing showed significant restriction and decreased DLCO.  Patient started Ofev on March 01, 2021.  Patient was admitted early March with severe weakness dyspnea and activity intolerance.  He also had intermittent confusion hypoxemia and a fever Tmax of 102.  He was felt to have an ILD  flare with possible viral illness however COVID, influenza and viral panel were all negative.  He was treated with steroids.  CT chest showed fibrotic ILD stable since October 2022.  His Ofev was held during admission.  After discharge he said that his energy level was improved and had decreased oxygen demands.  Based on oxygen after discharge was oxygen 2 L with activity and at bedtime.  Not requiring oxygen at rest with O2 saturations greater than 90%.  Patient was restarted on Ofev April 05, 2020.  After 2 weeks of been on Ofev patient developed low-grade fever.  This is waxed and waned and yesterday developed a fever Tmax of 103.  Patient went to the emergency room work-up was unrevealing.  CT chest showed stable ILD with no acute process.  CBC was normal blood cultures  are no growth to date.  Respiratory viral panel was negative.  Lactic acid was 1.5 urinalysis was normal, urine culture is pending.  LFTs were slightly elevated.  Since last night patient says he is feeling better. Fever is resolved. Took Tylenol . O2 saturations are 100%.  Has some sore throat that started late night .  Had good appetite today . No fever today . No increased Oxygen demands.  Wife is [name].  Patient denies any rash, skin lesions, skin redness, joint swelling, insect or tick bite, travel, discolored mucus, abdominal pain, nausea vomiting or diarrhea. Has no previous joint replacement or surgeries. Abdominal hernia repair >21yrs ago. No abd pain,  Followed by cardiology at [facility].  2D echo January 25, 2018 3 aortic valve is thickened with mild to moderate aortic valve stenosis.,  EF 55 to 60%.  Right ventricle is normal systolic function is normal.     OV 05/19/2021  Subjective:  Patient ID: Dustin Dean, male , DOB: [birth year] , age 60 y.o. , MRN: [id number] , ADDRESS: [address] [place] [address] PCP Dustin Canterbury, Dean Patient Care Team: Dustin Canterbury, Dean as PCP - General (Family Medicine)  This Provider  for this visit: Treatment Team:  Attending Provider: Kalman Shan, Dean    05/19/2021 -   Chief Complaint  Patient presents with   Follow-up    PFT performed today.  Pt states since stopping the OFEV, he has not had anymore fever and states overall he has felt good.    Chronic cough on gabapentin  - well controlled  Interstitial lung disease work-up in progress -concern for hypersensitive pneumonitis  -Progressive phenotype between August 2020 and October 2022   - started ofev mid feb 2023 -> early march 2023 admitted for ILD flare ->   9 mm right lower lobe nodule seen in October 2022 for the first time: Stable January 2023  HPI Dustin Dean 72 y.o. -returns with wife [name].  He tells me that after last visit he did a rechallenge with nintedanib and he picked up fever and ended up in the emergency room very similar to March 2023 hospitalization.  Symptoms started 1-2 weeks into taking nintedanib.  His wife feels strongly this is nintedanib related.  Have not seen the patient gets significant side effects from nintedanib but have seen one of the patient gets something similar with pirfenidone.  That particular patient had even after 1 dose.  I think the strength of evidence here is that this systemic inflammatory response is probably related to nintedanib.  Therefore he has stopped it.  I have advised him against taking nintedanib ever again.  Clinically he is feeling stable.  His walking desaturation test is stable.  His pulmonary function test is also stable.  He uses 2 L with exertion but today in office when we walked him on room air he dropped 7 points and this is similar to before.  He wants to try pirfenidone.  We have discussed this in the past.  We also discussed prednisone but he does not want to do prednisone because of hyperglycemia.  We discussed the risk, benefits and limitations of pirfenidone.  Pirfenidone is not well studied in chronic HP.  Is only studied in 1 subtype of  non-IPF progressive phenotype.  Nevertheless is the only other antifibrotic option available.  He wants to try a donor sample.  We will give him 2 months worth.  He is traveling to the beach  once he comes back  he will take it    07/03/2021 Follow up : ILD  Patient returns for a 6-week follow-up.  Patient has underlying interstitial lung disease with progressive phenotype.  Serial CT chest from August 20 to August 2022 showed progressive changes.  Serology was negative except for positive rheumatoid factor.  Rheumatology consult is pending.  Previous PFTs have showed significant restriction and decreased diffusing capacity.  Patient was started on Ofev March 01, 2021.  Patient had a SIRS like response to OFEV x 2 .  Ofev has been stopped indefinitely and advised not to restart.  Patient was placed on a drug holiday.  Started on Esbriet 1 month ago.  Patient says since starting Esbriet he seems to be tolerating very well.  LFTs prior to start had returned back to normal.  Previously were elevated.  Patient says he has had minimal nausea.  No diarrhea no upset stomach and appetite seems to be normal.  Patient says he has been using sunscreen when he is out side.  Has had no rash.  We discussed returning for ongoing labs. Patient has been referred to pulmonary rehab but has not contacted today. Patient says overall he is doing okay.  Wears his oxygen with activity as needed.  And at bedtime.  Has had no increased oxygen demands. Patient says he still gets short of breath with heavy activities.  But feels like he is stable at this point.  Has had no return of fever.      OV 09/08/2021  Subjective:  Patient ID: Dustin Dean, male , DOB: 1950/06/24 , age 84 y.o. , MRN: [id number] , ADDRESS: [address] [place] [address] PCP Dustin Canterbury, Dean Patient Care Team: Dustin Canterbury, Dean as PCP - General (Family Medicine)  This Provider for this visit: Treatment Team:  Attending Provider:  Kalman Shan, Dean    09/08/2021 -   Chief Complaint  Patient presents with   Follow-up    Pt states he has been doing okay since last visit and denies any complaints.      HPI Perla Letner 72 y.o. -returns for follow-up.  He tells me that he is lost more weight.  He is stable.  His symptom score shows good stability.  He is tolerating pirfenidone really well.  He has lost weight.  Current BMI is 31 but it is all intentional with the help of exercise, pirfenidone and also eating less calories.  No side effects from pirfenidone no sunburn nothing.  Is taking 3 to 2 pills 3 times daily.  He is willing to roll over to 1 big pill 3 times daily.  His main questions were -He wants to do some gardening such as cut wine but he assures me that will not be any exposure to organic dust.  -He wants to donate his body after death to medical school.  I advised him that either [facility] or [facility] is fine.  -He also has aortic stenosis.  He saw cardiology today at [facility].  He saw Dustin Dean.  He is being referred to surgery at [facility] for TAVR consideration.  According to the discussion [not clearly apparent in the chart review] TAVR is being recommended early because of his pulmonary fibrosis.  I did explain to him that in advance pulmonary fibrosis surgical risk is high but did want him to ask the surgeon if there is any downside to operating or placing TAVR procedure when the aortic stenosis is not  critical.  PFT   OV 11/17/2021  Subjective:  Patient ID: Dustin Dean, male , DOB: 27-Feb-1950 , age 49 y.o. , MRN: [id number] , ADDRESS: [address] [place] [address] PCP Dustin Canterbury, Dean Patient Care Team: Dustin Canterbury, Dean as PCP - General (Family Medicine)  This Provider for this visit: Treatment Team:  Attending Provider: Kalman Shan, Dean    11/17/2021 -   Chief Complaint  Patient presents with   Follow-up    PFT performed  today.  Pt states he has multiple concerns to discuss.     HPI Dustin Dean 72 y.o. -returns for follow-up.  He is doing well overall.  However for the last 3 days he has had a runny nose and with that he is having decreased energy.  He is got decreased social interest.  He also feels his memory is failing him more in the last 3 days he has noted some memory changes for the last several months but it is a lot worse in the last 3 days since the runny nose.  His COVID is negative but there is no fever brown sputum or yellow sputum.  There is no significant drainage.  There is no worsening shortness of breath there is no worsening cough there is no chills.  In addition he has new complaint of tender itchy scalp for the last 3 weeks there is no rash but he has an appointment with dermatologist because his scalp is dry and flaky.  He is not doing gardening but he does go out occasionally in the sun and he does not wear sunscreen or hat.  He is on pirfenidone which can cause skin rash and especially in the setting of sun exposure.  I did caution him this could be because of pirfenidone but he is going to see a dermatologist anyways.  Outside of this he is tolerating his pirfenidone well.  I did indicate to him he can roll himself into 1 big pill 3 times daily instead of 3 small pills 3 times daily for a total of 9 pills.  He also told me that he is at risk for depression.  He says in the past he has had significant depression he is on antidepressant.  He is worried this can come back with his ILD.  We talked about seeing Georgiann Mohs clinical psychologist who specializes in medical issues he is willing to see him.  Also discussed about doing light therapy  Preoperative evaluation: He has aortic stenosis.  He is saying now that it could be critical.  Apparently TAVR procedure is being planned date unknown.  Currently because of the cold he has suspended pulmonary rehabilitation he wants to know if he can go  back.  I did tell him we need to get clearance from the cardiologist because of critical aortic stenosis history I thought it was moderate in the past.          OV 02/26/2022  Subjective:  Patient ID: Dustin Dean, male , DOB: 1950/09/25 , age 48 y.o. , MRN: [id number] , ADDRESS: [address] [place] [address] PCP Dustin Canterbury, Dean Patient Care Team: Dustin Canterbury, Dean as PCP - General (Family Medicine)  This Provider for this visit: Treatment Team:  Attending Provider: Kalman Shan, Dean    HPI Dustin Dean 72 y.o. -presents for follow-up.  Presents with his wife.  His wife is an independent historian today.  Since I last saw him in November 2023 several issues have  happened.  1 in mid November 2023 had sinus congestion and he was given doxycycline and prednisone.  Then in mid December 2023 he had TAVR procedure for his aortic stenosis.  He says this went really well and his murmur has now resolved.  Then straight after Christmas 2023 he had COVID-19.  WE called in molnupiravir.  He is tolerating his pirfenidone at 801 mg pill 3 times daily without any problems.  He has lost some weight intentionally.  He does admit that after all this he is slightly more short of breath than before..  In fact when he walked him he started desaturating.  This is a new finding.  He had pulmonary function test today that shows significant decline in his DLCO.  This was done today and is captured below.  His most recent hemoglobin on 02/20/2022 was reviewed done at [facility].  Was 13.6 g% and normal.  We discussed the possible need of right heart catheterization.  When he was in the office he did not recall that he actually had a right heart catheterization.  After he left the office when I reviewed the records I noticed that in October 2023 he did have heart catheterization.  At that time he did have a right heart catheterization before the TAVR.  In this the pulmonary capillary wedge pressure  was 11 mmHg, pulmonary artery mean pressure was 27 mmHg and elevated, Fick cardiac index was 3.3 mL.  It was in this catheterization that they saw that he had severe aortic valve stenosis with aortic valve area of 1.05 cm and a valve gradient of 40 mmHg.  He did have follow-up echocardiogram on 02/08/2022.  His left ventricular ejection fraction is 55-60%.  Right ventricle is normal.  There is TAVR bioprosthetic valve.  No pericardial effusion.     Last Weight  Most recent update: 02/26/2022 10:35 AM    Weight  93.4 kg (205 lb 12.8 oz)              Modified Six Minute Walk - 02/26/22 1000     Type of O2 used  O2 3/L   POC pulsed oxygen   Number of laps completed  3    Lap Pace Moderate    Resting Heartrate 83 bpm    Final Heartrate 113 bpm    Resting Pulse Ox 94 %   room air. Patient feels he did not need his POC on at this time.   Desaturated to <= 3 points Yes    Desaturated to < 88% Yes    Distance walked when desaturation occurred  240 feet   patient completed one lap.   Became tachycardic Yes    Symptoms  SOB after one lap on room air.  Applied 3 lpm POC.  SaO2 back to 95%.  patient was able to complete remaining 2 laps - 3 laps total.    Was the O2 correction test done? Yes    Amount of O2 needed to correct destauration 3 L    Distance walked without destat below 88%  480 feet   patient walked remaining 2 laps on 3 lpm POC pulsed oxygen   comments patient desat after 1 lap to 88% room air.  POC oxygen applied  at 3 lpm pulsed.  patient completed remaining 2 laps SaO2 95%.            OV 04/08/2022  Subjective:  Patient ID: Dustin Dean, male , DOB: 1950-03-14 , age 42 y.o. , MRN:  [id number] , ADDRESS: [address] [place] [address] PCP Dustin Canterbury, Dean Patient Care Team: Dustin Canterbury, Dean as PCP - General (Family Medicine)  This Provider for this visit: Treatment Team:  Attending Provider: Kalman Shan, Dean   02/26/2022 -   Chief Complaint  Patient  presents with   Follow-up    COVID end of December 2023.  Aortic valve replacement (TAVR)  01/02/22.  Cough persistent.  Runny nose improved after seeing ENT.    Latest Reference Range & Units 02/17/16 19:20 04/26/21 15:31 05/01/21 19:55 12/20/21 12:34 01/09/22 23:37  Eosinophils Absolute 0.0 - 0.5 K/uL 0.4 0.3 0.2 0.6 0.4    Anxiety and depression  -Seeing Dustin Dean psychology  04/08/2022 -   Chief Complaint  Patient presents with   Follow-up    Pt is here for follow up. Pt states no adverse effects noted so far for the Esbriet.      HPI Summer Haenel 72 y.o. - returns for followup. Still on esbriet. Symotoms stable. Was suppsoed to have had RHC but on day of procedure DrSam Turner had multiple other patient emergencies. So it is rescheduled. Date pending.  Here to review PFT and CT Due to concern for progression. CT is definitel progressed from a year ago - despite esbriet.  PFT worse since a year ago but since covidd ? Rebounding back. We discuseed disease progression . Eplained next step is to try immune modulator. 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 We took shared decision to awaith RHC. Reommended he hold off clinical trial at this point. Cough still + but says is better with gabapentin. He does have high eos and we will discuss this next visit.  Obesity: losing weight  Wife also present and was independent historian     OV 05/22/2022  Subjective:  Patient ID: Dustin Dean, male , DOB: 1950/05/15 , age 8 y.o. , MRN: [id number] , ADDRESS: [address] [place] [address] PCP Dustin Canterbury, Dean Patient Care Team: Dustin Canterbury, Dean as PCP - General (Family Medicine)  This Provider for this visit: Treatment Team:  Attending Provider: Kalman Shan, Dean -   05/22/2022 -   Chief Complaint  Patient presents with   Follow-up    F/up on  PFT     HPI Daigan Mudd 72 y.o. -returns for follow-up for his above issues.  Since his last visit he had his right heart catheterization.  It confirmed pulmonary hypertension present even after his TAVR procedure.  We ordered treprostinil.  He got the package last week and is going to start it tonight after the [place] therapeutics agent visits with him and sets up the package for him.  He is looking forward to starting it.  Currently tolerating pirfenidone well.  He is going through cardiac rehab.  He states his stamina is improved his weight has come down.  His shortness of breath is better.  All reflected below.  Even the cough is better on Neurontin.  He is now seeing counselor Serafina Mitchell and is feeling better.  He had pulmonary function test and it shows continued recent stability.  This is also described below.  He continues to use portable oxygen.  Recently went to [facility] and did some unloading and he did not need oxygen.  Today made him do a sit/stand test x 10 times.  He  did not desaturate below 93%.  OV 06/19/22  69-year-old male followed for interstitial lung disease with progressive phenotype (diagnosed in 2019) Seen for pulmonary consult to establish for ILD in January 2023.  Has chronic respiratory failure on oxygen History of prostate cancer.  History of aortic valve stenosis followed by cardiology at [facility] video visit is a 1 month follow-up for pulmonary hypertension related ILD patient says since last visit he is doing better.  Has completed pulmonary rehab.  And now is beginning a new exercise class at local gym.  Patient remains on Esbriet.  He says he has no issues.  Appetite is good with no nausea vomiting or diarrhea.  He was recently started on Tyvaso. Patient is doing well. Completing pulmonary rehab.  Going to a new exercise class Tolerating Tyvaso well.  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  Remains on  oxygen 2 L with activity and at bedtime.  OV 08/21/2022  Subjective:  Patient ID: Dustin Dean, male , DOB: September 16, 1950 , age 38 y.o. , MRN: [id number] , AD[address]ESS: [address] [place] [address] PCP Dustin Canterbury, Dean Patient Care Team: Dustin Canterbury, Dean as PCP - General (Family Medicine)  This Provider for this visit: Treatment Team:  Attending Provider: Kalman Shan, Dean  08/21/2022 -   Chief Complaint  Patient presents with   Follow-up    Currently in hosp. wants to discuss treatment.    Type of visit: Video Virtual Visit Identification of patient Ruairi Brasuell with 02/09/1950 and MRN [id number] - 2 person identifier Risks: Risks, benefits, limitations of telephone visit explained. Patient understood and verbalized agreement to proceed Anyone else on call: just patient Patient location: [facility] This provider location: [address]; [place]; [place] [address]. [facility]. [phone]     HPI Cassidy Diedrich 72 y.o. -on this video visit as a follow-up video visit because he is now an inpatient at [facility] which is not part of [facility].  He converted the onsite visit to a video visit.  He told me 08/16/2022 and confirmed on review of the external medical records he got admitted with sudden desaturations.  He has been treated with steroids he was requiring 6 L.  He has somewhat improved.  He wants to get discharged but he says when they walked him with 3 L nasal cannula he desaturated. Labs at [facility] creat normal. CTA angio - negative for PE. Says RVP negaive. He feels he is getting better. He wants to go home. I d/w  over phone with Dr Oliver Pila of ccm there. Not on his rounding list but will review with primary/. Care everywhere does not indicate echo/bnp - indicated for this to be done + Consider HRCT + PCCM consult there with Dr Shelby Mattocks Aug 2024   09/05/2022 Patient  presents today for hospital follow-up.  Patient was admitted for community-acquired pneumonia and discharged on 08/22/22. He originally presents to ED with low oxygen levels. CTA negative for PE, possible infiltrate.  He completed course of cefepime and doxycycline. He had a video visit with Dr. Marchelle Dean on 08/21/22 while admitted to [facility]. He was advised to stay on prednisone until follow-up in clinic.He is oxygen dependent for hx chronic respiratory failure. He was discharged on 6L oxygen; he is currently using 2L oxygen at rest and 3L with exertion. Needs  large home oxygen concentrator. Continues Esbriet and Tyvaso as directed. Patient had PFT on July 30th prior to admission. Echo/ bnp norm   OV 12/20/2022  Subjective:  Patient ID: Dustin Dean, male , DOB: Feb 09, 1950 , age 42 y.o. , MRN: [id number] , ADDRESS: [address] [place] [address] PCP Dustin Canterbury, Dean Patient Care Team: Dustin Canterbury, Dean as PCP - General (Family Medicine)  This Provider for this visit: Treatment Team:  Attending Provider: Kalman Shan, Dean    Chronic cough on gabapentin  - well controlled  Interstitial lung disease high prob for hypersensitive pneumonitis  -Progressive phenotype between August 2020 and October 2022   - started ofev mid feb 2023 -> early march 2023 admitted for ILD flare -> rechallenged ofev April 2023 -> ER vist SIRS   - SAE with OFev/ Ofev stopped  - started Pirfenioned May 2023 (rjected prednisone due to side effec tprofile)  - transplaint discussion marrch 2-24 and December 2024- not interested -   -Not interested in transplant, chronic prednisone and CellCept December 2024  9 mm right lower lobe nodule seen in October 2022 for the first time: Stable January 2023 repoted as fibrosis ->Not reported n feb 2024   8 mm lateral subpleural left lower lobe nodule (5/93),-> unchanged from 08/30/2017.-> through feb 2024  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  Respiratory exacerbations/infections  -Mid November 2023: Sinus congestion and given doxycycline and prednisone  -01/09/2022: COVID-19 and given molnupiravir E  -Summer 2024 admission  RAST allergy panel December 2023 -Mildly positive for cat and dog dander.  Otherwise negative.  Normal IgE'. ELevaed Eos  High risk med  - Esbriet/Pirfenidone requires intensive drug monitoring due to high concerns for Adverse effects of , including  Drug Induced Liver Injury, significant GI side effects that include but not limited to Diarrhea, Nausea, Vomiting,  and other system side effects that include Fatigue, headaches, weight loss and other side effects such as skin rash. These will be monitored with  blood work such as LFT initially once a month for 6 months and then quarterly    12/20/2022 -   Chief Complaint  Patient presents with   Follow-up    Pft and ct f/u. Pt denies any concerns      HPI Dustin Dean 73 y.o. -returns for follow-up.  Presents with his wife who is an independent historian.  I personally saw him in May 2024 but since then much has happened.  He got admitted to hospital with pulmonary fibrosis flareup.  Since then he is on 4-5 L continuous.  Today turned his oxygen off he was 87% room air at rest.  Better soon as he moves he says he desaturates.  His lung function has significantly declined.  His FVC is down 16% since February 2024.  He had a CT scan of the chest just before Thanksgiving 2024 and our radiologist thought he is not worse compared to earlier this year but definitely worse compared to a year ago but his history and his lung function show clearly he is declining.  He stopped taking inhaled treprostinil for his WHO group 3  pulmonary hypertension because of severe cough.  However he  is tolerating his pirfenidone well.  He lives with his wife.  There is a "young man" and his wife who are basically like surrogate children.  That is a social support.  He says at this point in time he is on this attending that his life expectancy is diminished.  We had a long conversation about goals of care.  Talked about therapeutic options of chronic prednisone, CellCept and transplant.  He is not interested in any of this.  This because of side effect profile.  He wanted know about his life expectancy.  I did tell him there is a 10 fold increase risk of death in 2023-12-26 because of his significant decline.  I did tell him that I will be very surprised if he is alive 2 years from now and modestly surprised if he was alive a year from now.  We talked about the concept of home palliative care.  We talked about the concept of hospice.  He has done a living will however he has not done a MOST form.  I did explain to him to consider himself no CPR and no intubation but accept BiPAP and simple medical treatment.  Also offered the concept of home palliative care at this point in time and if further decline this can be converted to hospice.  He is processing all this information.  His wife is in tears.  Patient himself states he is realistic.    Discussed medicare hospice benefit  - a medicare paid benefit  - for people with terminal qualifying  illness such as IPF, COPD, cancer with statistical prognosis < 6 months for which there is no cure - utilization of hospice shows people live longer paradoxically than those without hospice due to improved attention  - explained hospice locations - home, residential etc.,   - explained respite care options for caregivers  - explained that she could still get treatment for non-hospice diagnosis and still come to office to see me for hospice related diagnosis that i provide support for  - explained that hospice  provides nursing, Dean, chaplain, volunteer and medications and supplies paid through medicare      He has some urgent continence.  He wants to know if he can take Myrbetriq.  I told him that he could tell his urologist he could.   SYMPTOM SCALE - ILD 01/24/2021 02/23/2021   04/05/2021 Post hopsilaization. On pred taper and off ofev Ofev 150 twice daily; No prednsone  05/19/2021 214# 11/17/2021  02/26/2022 205# 04/08/2022 199# 05/22/2022 199# - cardica reab 12/20/2022   Current weight         esbiret esbriet esbiret Esbietr - going to start [employer], sildenafil. OFF tyvaso  O2 use 2L [place] with ex 2L with ex 2L [place] ex at hoe 2L        Shortness of Breath 0 -> 5 scale with 5 being worst (score 6 If unable to do)             At rest 0 0 0 1 0 0 0 0 0 -  Simple tasks - showers, clothes change, eating, shaving 1 1 0.5 2 1 1  0 0 0 2  Household (dishes, doing bed, laundry) 1 1 1 2 2 1 1 1 1 3   Shopping 2 1 0 NA 1 1 1 1 1 3   Walking level at own pace 5 2 0 1 (with oxygen) 1 2 2 1  0 3  Walking up Stairs 5 3 1  with o2, 6 without o2 2 (with oxygen) 2 3 3 2 2 4   Total (30-36) Dyspnea Score 14 8   8 7 8 7 5  with o2 4 with o2 15  How bad is your cough? 2 3 0 with gbapentin 1-2 (on gabapentin) 2 3 2 3 1 4   How bad is your fatigue 1 2 0 4 1 4 0 0 0 3   How bad is nausea 00000 0 0 0 0 0 0 0 0 0   How bad is vomiting?   0 0 0 0 0 0 0 0 0 0   How bad is diarrhea? 0 0 0 0 0 0 0 0 0 0   How bad is anxiety? 0 0 0 0 0 2 0 1 3 0   How bad is depression 0 0 0 0 (on antidepressant) 1 2 0 1 3 0  Any chronic pain - if so where and how bad 0 0 0 0 0  1 x x x     Simple office walk 185 feet x  3 laps goal with forehead probe 01/24/2021  04/05/2021 On prendisone. Off ofev 05/19/2021 Off ofev 09/08/2021 On esbiret since may 2023 02/26/2022 Esbiret since may 2023 04/06/22 05/22/2022 Full dose Esbriet.  Going to start treprostinil 12/20/2022 87-89% RA at rest  O2 used Ra at rest, desat at 1 lap and then walked with 2L  piulse Ra - being on room air for 15 min and in mddile of pred burst taper   ra ra Room air   Number laps completed 1 lap on RA, then 3 laps on 2L Did all 3 laps   1 of 3 laps - forehead probe 3 of 3 laps Sit/stand x 10 times   Comments about pace x avg  avg av     Resting Pulse Ox/HR 96% and 88/min at 1 lap 98% an Hr 92 98% AND HR 81 98% and HR 77 94% 94% and HR 82 98 send and heart rate 81   Final Pulse Ox/HR 92% and 115/min on 3 laps on 2L pulsed 92% and HR 117 91% and HR 115 92% and HR 113 88% 88% and HR 109 93% and heart rate 102   Desaturated </= 88% no no no no      Desaturated <= 3% points yes Yes, 6  Yes 7  Yes 6 pots      Got Tachycardic >/= 90/min yes yes yes avg      Symptoms at end of test Modreate dyspnea Modate dyspnea Mild dyspnea Mild dyspnea   Level 2 out of 10 dyspnea   Miscellaneous comments Corrected with 2L [place] Did not need o2, improved             PFT     Latest Ref Rng & Units 12/18/2022    4:26 PM 08/14/2022   10:53 AM 05/22/2022    9:53 AM 04/04/2022    8:31 AM 02/26/2022    9:56 AM 11/17/2021    1:02 PM 05/19/2021   10:56 AM  ILD indicators  FVC-Pre L 1.69  1.88  2.02  1.98  2.02  1.94  1.95   FVC-Predicted Pre % 41  45  48  51  52  46  46   FVC-Post L    2.18      FVC-Predicted Post %    57      TLC L    3.57      TLC Predicted %  57      DLCO uncorrected ml/min/mmHg 10.20  15.13  14.22  16.38  12.33  16.63  19.56   DLCO UNC %Pred % 41  62  57  71  53  67  79   DLCO Corrected ml/min/mmHg 10.20  15.13  14.38  16.38  12.62  16.87  19.56   DLCO COR %Pred % 41  62  58  71  54  68  79       LAB RESULTS last 96 hours No results found.  LAB RESULTS last 90 days Recent Results (from the past 2160 hour(s))  Pulmonary function test     Status: None   Collection Time: 12/18/22  4:26 PM  Result Value Ref Range   FVC-Pre 1.69 L   FVC-%Pred-Pre 41 %   FEV1-Pre 1.50 L   FEV1-%Pred-Pre 49 %   FEV6-Pre 1.69 L   FEV6-%Pred-Pre 43 %   Pre FEV1/FVC ratio 89 %    FEV1FVC-%Pred-Pre 120 %   Pre FEV6/FVC Ratio 100 %   FEV6FVC-%Pred-Pre 106 %   FEF 25-75 Pre 2.26 L/sec   FEF2575-%Pred-Pre 98 %   DLCO unc 10.20 ml/min/mmHg   DLCO unc % pred 41 %   DLCO cor 10.20 ml/min/mmHg   DLCO cor % pred 41 %   DL/VA 4.09 ml/min/mmHg/L   DL/VA % pred 72 %         has a past medical history of Aortic stenosis, Flu, Pneumonia, Prostate cancer (HCC), Pulmonary fibrosis (HCC), and Skin cancer.   reports that he has never smoked. He has been exposed to tobacco smoke. He has never used smokeless tobacco.  Past Surgical History:  Procedure Laterality Date   HERNIA REPAIR     NASAL SEPTUM SURGERY     SKIN CANCER EXCISION      Allergies  Allergen Reactions   Bee Venom Rash   Shellfish Allergy Hives   Nintedanib Other (See Comments)    SIRS, fevers   Pseudoephedrine Anxiety    Immunization History  Administered Date(s) Administered   Fluad Quad(high Dose 65+) 10/23/2017, 09/30/2019, 10/14/2020, 10/18/2020   Influenza-Unspecified 11/11/2009, 11/08/2011, 11/03/2013, 11/22/2014, 11/10/2015   Moderna Covid-19 Fall Seasonal Vaccine 16yrs & older 10/05/2022   Moderna Covid-19 Vaccine Bivalent Booster 10yrs & up 10/24/2020   Moderna Sars-Covid-2 Vaccination 03/19/2019, 04/16/2019, 11/16/2019, 05/07/2020   Pfizer(Comirnaty)Fall Seasonal Vaccine 12 years and older 10/19/2021   Pneumococcal Conjugate-13 12/08/2007, 12/21/2015, 09/04/2018   Pneumococcal Polysaccharide-23 12/08/2007, 06/21/2017, 06/16/2018   Tdap 12/08/2007, 11/27/2018   Zoster Recombinant(Shingrix) 10/30/2013, 09/04/2018, 11/27/2018   Zoster, Live 10/30/2013   Zoster, Unspecified 10/30/2013    Family History  Problem Relation Age of Onset   Cancer Mother    Congestive Heart Failure Father    Prostate cancer Father    Healthy Sister      Current Outpatient Medications:    acetaminophen (TYLENOL) 325 MG tablet, Take by mouth as needed., Disp: , Rfl:    aspirin-acetaminophen-caffeine  (EXCEDRIN MIGRAINE) 250-250-65 MG tablet, Take by mouth as needed., Disp: , Rfl:    Calcium Carb-Cholecalciferol (CALCIUM/VITAMIN D PO), Take 1 tablet by mouth daily., Disp: , Rfl:    Cetirizine HCl (ZYRTEC PO), Take by mouth., Disp: , Rfl:    ESBRIET 801 MG TABS, Take 1 tablet (801 mg total) by mouth 3 (three) times daily with meals., Disp: 270 tablet, Rfl: 2   fluorouracil (EFUDEX) 5 % cream, Apply topically., Disp: , Rfl:    fluticasone (FLONASE) 50 MCG/ACT nasal spray, Place  into both nostrils daily., Disp: , Rfl:    gabapentin (NEURONTIN) 300 MG capsule, Take 600 mg by mouth 3 (three) times daily., Disp: , Rfl:    ipratropium (ATROVENT) 0.06 % nasal spray, Place 2 sprays into both nostrils 3 (three) times daily., Disp: , Rfl:    loperamide (IMODIUM) 2 MG capsule, Take 1 mg by mouth once., Disp: , Rfl:    meclizine (ANTIVERT) 25 MG tablet, Take 25 mg by mouth 2 (two) times daily as needed., Disp: , Rfl:    Multiple Vitamin (MULTIVITAMIN) capsule, Take 1 capsule by mouth every morning., Disp: , Rfl:    naproxen sodium (ALEVE) 220 MG tablet, Take 220 mg by mouth daily as needed., Disp: , Rfl:    omeprazole (PRILOSEC) 40 MG capsule, Take 40 mg by mouth daily., Disp: , Rfl:    PAROXETINE HCL PO, Take by mouth., Disp: , Rfl:    rosuvastatin (CRESTOR) 5 MG tablet, Take 1 tablet by mouth daily., Disp: , Rfl:    SUMAtriptan (IMITREX) 100 MG tablet, TAKE ONE TABLET BY MOUTH AT ONSET OF HEADACHE; MAY REPEAT ONE TABLET IN 2 HOURS IF NEEDED. MAX OF 200 MG PER DAY, Disp: , Rfl:    predniSONE (DELTASONE) 10 MG tablet, 30mg  x 5 days; 20mg  x 5 days; 10mg  x 5 days; 5mg  x 5 days; then stop (Patient not taking: Reported on 12/20/2022), Disp: 35 tablet, Rfl: 0      Objective:   Vitals:   12/20/22 1537  BP: 110/70  Pulse: 83  SpO2: 94%  Weight: 206 lb (93.4 kg)  Height: 5\' 8"  (1.727 m)    Estimated body mass index is 31.32 kg/m as calculated from the following:   Height as of this encounter: 5\' 8"   (1.727 m).   Weight as of this encounter: 206 lb (93.4 kg).  @WEIGHTCHANGE @  Filed Weights   12/20/22 1537  Weight: 206 lb (93.4 kg)     Physical Exam   General: No distress. Looks same but on o2 O2 at rest: yes, 87% RA rest Cane present: no Sitting in wheel chair: no Frail: no Obese: no Neuro: Alert and Oriented x 3. GCS 15. Speech normal Psych: Pleasant Resp:  Barrel Chest - no.  Wheeze - no, Crackles - YES, No overt respiratory distress CVS: Normal heart sounds. Murmurs - no Ext: Stigmata of Connective Tissue Disease - no HEENT: Normal upper airway. PEERL +. No post nasal drip        Assessment:       ICD-10-CM   1. ILD (interstitial lung disease) (HCC)  J84.9     2. Pulmonary hypertension (HCC)  I27.20     3. Chronic respiratory failure with hypoxia (HCC)  J96.11     4. Encounter for medication counseling  Z71.89     5. 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 16% decline in lung function 03-14-22-> Now - risk for death is 10X higher in 12-Jan-2024 - now 87% RA at rest but now needing 5L [place] at all times - noted tyvaso intolerance   Plan (shared decision making) - continue sildenafil --Continue pirfenidone 801mg  x 3 times daily  - take with food  - space 5-6 hours  apart  - apply sunscreen  - wear hat  - hydrate well   - do LFT 12/20/2022 (last sept 2024)  - discussed transplant, chronic prednisone and cellcept  - respect refusal  - discussed life expectancy and goals of care and hospice  - recommend MOST Form. No CPR . No inutbation   - take sample of MOST FORM   - ok for home palliative care when ready  - spirometry but NO DLCO in 12  weeks   Follow-up  - 12  weeks 30-minute visit with Dr. Marchelle Dean but after spirometry and DLCO.   FOLLOWUP Return in about 12 weeks (around 03/14/2023) for 30 min visit.    SIGNATURE    Dr. Kalman Dean, M.D., F.C.C.P,  Pulmonary and Critical Care Medicine Staff Physician, [facility] Director - Interstitial Lung Disease  Program  Pulmonary Fibrosis [facility] at [facility] [place], [place], [address]  Pager: [phone], If no answer or between  15:00h - 7:00h: call [phone] Telephone: [phone]  4:25 PM 12/20/2022

## 2022-12-21 LAB — HEPATIC FUNCTION PANEL
ALT: 11 U/L (ref 0–53)
AST: 21 U/L (ref 0–37)
Albumin: 4 g/dL (ref 3.5–5.2)
Alkaline Phosphatase: 97 U/L (ref 39–117)
Bilirubin, Direct: 0 mg/dL (ref 0.0–0.3)
Total Bilirubin: 0.3 mg/dL (ref 0.2–1.2)
Total Protein: 7.5 g/dL (ref 6.0–8.3)

## 2022-12-29 ENCOUNTER — Encounter: Payer: Self-pay | Admitting: Internal Medicine

## 2022-12-29 DIAGNOSIS — J849 Interstitial pulmonary disease, unspecified: Secondary | ICD-10-CM

## 2022-12-29 NOTE — Telephone Encounter (Signed)
Mychart sent by pt:  [name] (supporting Kalman Shan, MD)28 minutes ago (11:26 AM)   KR Dr. Marchelle Gearing,   When I met with you on the 5th I mentioned palliative care to you but we never circled back around to it. Since I will inevitably be in hospice at some point, I want to establish a relationship now with the folks that oversee it.   I you haven't already, can you please have someone reach out to me?   Thank you, Dustin Dean    Dr. Marchelle Gearing, please advise if you are okay with the referral being placed.

## 2022-12-31 ENCOUNTER — Encounter: Payer: Self-pay | Admitting: Behavioral Health

## 2022-12-31 ENCOUNTER — Ambulatory Visit: Payer: Medicare Other | Admitting: Behavioral Health

## 2022-12-31 DIAGNOSIS — F411 Generalized anxiety disorder: Secondary | ICD-10-CM

## 2022-12-31 DIAGNOSIS — R454 Irritability and anger: Secondary | ICD-10-CM

## 2022-12-31 DIAGNOSIS — F331 Major depressive disorder, recurrent, moderate: Secondary | ICD-10-CM | POA: Diagnosis not present

## 2022-12-31 NOTE — Progress Notes (Signed)
[facility] Counselor Initial Adult Exam  Name: Dustin Dean Date: 12/31/2022 MRN: [id number] DOB: Apr 21, 1950 PCP: [name], MD  Time spent: 56 minutes,  2 PM until 2:56 PM  spent in person with the patient in the outpatient therapist office.  The patient met with his pulmonologist since our last session and there has been some progression of the pulmonary fibrosis.  The patient has cognitively accepted the fact that he knows that this is not something that can be fixed or reversed and he understands that doctors cannot pinpoint specifics but he did ask his doctor if he could give him an idea of what he expected based on the pattern of progression of the fibrosis.  He said his doctor's answer was that he should be able to enjoy based on the pattern of growth all of 2025 and perhaps a little of 2026.  The patient understands cognitively that is not written in stone but it does relieve some anxiety.  He said his headaches upset but there is still what he describes as pangs of sadness and hearing that news.  We began to talk about what that looks like for him moving forward.  We talked about learning and what every time there is to [facility] where he is planted.  Because he is a person of faith we talked about being in prayer about how God wants him to live his life out and how God can use him to reach out to other people, to enjoy and strengthen relationships that he is already a part of growing his faith. He does contract for safety having no thoughts of hurting himself or anyone else.   Guardian/Payee: Self  Paperwork requested: Yes   Reason for Visit /Presenting Problem: Depression  . He does contract for safety having no thoughts of hurting himself or anyone else. Mental Status Exam: Appearance:   Fairly Groomed     Behavior:  Appropriate  Motor:  Normal  Speech/Language:   Clear and Coherent and Normal Rate  Affect:  Appropriate  Mood:  normal  Thought process:  normal   Thought content:    WNL  Sensory/Perceptual disturbances:    WNL  Orientation:  oriented to person, place, time/date, situation, and day of week  Attention:  Good  Concentration:  Good  Memory:  WNL  Fund of knowledge:   Good  Insight:    Good  Judgment:   Good  Impulse Control:  Good     Reported Symptoms: Depression  Risk Assessment: Danger to Self:  No Self-injurious Behavior: No Danger to Others: No Duty to Warn:no Physical Aggression / Violence:No  Access to Firearms a concern: No  Gang Involvement:No  Patient / guardian was educated about steps to take if suicide or homicide risk level increases between visits: n/a While future psychiatric events cannot be accurately predicted, the patient does not currently require acute inpatient psychiatric care and does not currently meet [facility] involuntary commitment criteria.  Substance Abuse History: Current substance abuse: No     Past Psychiatric History:   Previous psychological history is significant for depression Outpatient Providers: Primary care physician History of Psych Hospitalization:  None reported Psychological Testing:  n/a    Abuse History:  Victim of: No.,  None reported    Report needed: No. Victim of Neglect:No. Perpetrator of n/a Witness / Exposure to Domestic Violence: None reported  Protective Services Involvement: No  Witness to [employer] Violence:  No   Family History:  Family History  Problem Relation Age of Onset   Cancer Mother    Congestive Heart Failure Father    Prostate cancer Father    Healthy Sister     Living situation: the patient lives with their spouse  Sexual Orientation: Straight  Relationship Status: married  Name of spouse / other: Did not discuss If a parent, number of children / ages: Adults  Support Systems: spouse  Financial Stress:  No   Income/Employment/Disability: Doctor, general practice: No   Educational  History: Education: high school diploma/GED  Religion/Sprituality/World View: Did not discuss  Any cultural differences that may affect / interfere with treatment:  not applicable   Recreation/Hobbies: Time with family  Stressors: Health problems    Strengths: Supportive Relationships, Hopefulness, Self Advocate, and Able to Communicate Effectively  Barriers:     Legal History: Pending legal issue / charges: The patient has no significant history of legal issues. History of legal issue / charges:  Not applicable  Medical History/Surgical History: reviewed Past Medical History:  Diagnosis Date   Aortic stenosis    Flu    Pneumonia    Prostate cancer (HCC)    Pulmonary fibrosis (HCC)    Skin cancer     Past Surgical History:  Procedure Laterality Date   HERNIA REPAIR     NASAL SEPTUM SURGERY     SKIN CANCER EXCISION      Medications: Current Outpatient Medications  Medication Sig Dispense Refill   acetaminophen (TYLENOL) 325 MG tablet Take by mouth as needed.     aspirin-acetaminophen-caffeine (EXCEDRIN MIGRAINE) 250-250-65 MG tablet Take by mouth as needed.     Calcium Carb-Cholecalciferol (CALCIUM/VITAMIN D PO) Take 1 tablet by mouth daily.     Cetirizine HCl (ZYRTEC PO) Take by mouth.     ESBRIET 801 MG TABS Take 1 tablet (801 mg total) by mouth 3 (three) times daily with meals. 270 tablet 2   fluorouracil (EFUDEX) 5 % cream Apply topically.     fluticasone (FLONASE) 50 MCG/ACT nasal spray Place into both nostrils daily.     gabapentin (NEURONTIN) 300 MG capsule Take 600 mg by mouth 3 (three) times daily.     ipratropium (ATROVENT) 0.06 % nasal spray Place 2 sprays into both nostrils 3 (three) times daily.     loperamide (IMODIUM) 2 MG capsule Take 1 mg by mouth once.     meclizine (ANTIVERT) 25 MG tablet Take 25 mg by mouth 2 (two) times daily as needed.     Multiple Vitamin (MULTIVITAMIN) capsule Take 1 capsule by mouth every morning.     naproxen sodium  (ALEVE) 220 MG tablet Take 220 mg by mouth daily as needed.     omeprazole (PRILOSEC) 40 MG capsule Take 40 mg by mouth daily.     PAROXETINE HCL PO Take by mouth.     predniSONE (DELTASONE) 10 MG tablet 30mg  x 5 days; 20mg  x 5 days; 10mg  x 5 days; 5mg  x 5 days; then stop (Patient not taking: Reported on 12/20/2022) 35 tablet 0   rosuvastatin (CRESTOR) 5 MG tablet Take 1 tablet by mouth daily.     SUMAtriptan (IMITREX) 100 MG tablet TAKE ONE TABLET BY MOUTH AT ONSET OF HEADACHE; MAY REPEAT ONE TABLET IN 2 HOURS IF NEEDED. MAX OF 200 MG PER DAY     No current facility-administered medications for this visit.    Allergies  Allergen Reactions   Bee Venom Rash   Shellfish Allergy Hives   Nintedanib Other (See  Comments)    SIRS, fevers   Pseudoephedrine Anxiety    Diagnoses:  Major depressive disorder, recurrent, moderate, irritability Plan of Care: I will meet with the patient every 2 to 3 weeks in person Treatment plan: We will use cognitive behavioral therapy, even though some therapy as well as elements of dialectical behavior therapy to help the patient reduce his anxiety by at least 50% with a target date of  October 30th 2024.  Goals will be to help the patient better manage anxiety symptoms and stress, identify causes for anxiety including changes in his health and ways to lower the anxiety, resolve the conflicts contributing to anxiety and help him manage thoughts and worrisome thinking contributing to his anxiety.  Interventions include providing education about anxiety to help him understand its causes symptoms and triggers..  We will facilitate problem solution skills as well as coping skills to help him manage anxiety and stress symptoms.  We will use cognitive behavioral therapy to identify and change anxiety provoking thought and behavior patterns as well as dialectical therapy to teach distress tolerance and mindfulness skills. Progress: 35% We will continue with treatment goals as  listed above with a new target date of May 15, 2023                                  [place], [facility]                  French Ana, [facility]               French Ana, [facility]               French Ana, [facility]               French Ana, [facility]               French Ana, [facility]               French Ana, [facility]               French Ana, [facility]               French Ana, [facility]               French Ana, [facility]               French Ana, [facility]               French Ana, [facility]

## 2022-12-31 NOTE — Telephone Encounter (Signed)
Pls refer to outpatient pallaitive care. Only see amb ref hospie but what he/I want is pall care outpatient home visits

## 2023-01-21 NOTE — Telephone Encounter (Signed)
 The colonoscopy has to be done in the hospital under anestheisa. Gi has been able to do it on patients with6-8L [place] at rest but needs anesthesia and hospital. YEs, there is some elevated risk due to fibrosis

## 2023-01-21 NOTE — Telephone Encounter (Signed)
 MR- pt is asking if you have any objections to upcoming colonoscopy. Please advise. Thanks.

## 2023-01-24 ENCOUNTER — Encounter: Payer: Self-pay | Admitting: Behavioral Health

## 2023-01-29 ENCOUNTER — Ambulatory Visit: Payer: Medicare Other | Admitting: Behavioral Health

## 2023-02-12 ENCOUNTER — Ambulatory Visit: Payer: Medicare Other | Admitting: Behavioral Health

## 2023-02-12 ENCOUNTER — Encounter: Payer: Self-pay | Admitting: Behavioral Health

## 2023-02-12 DIAGNOSIS — F331 Major depressive disorder, recurrent, moderate: Secondary | ICD-10-CM | POA: Diagnosis not present

## 2023-02-12 DIAGNOSIS — R454 Irritability and anger: Secondary | ICD-10-CM | POA: Diagnosis not present

## 2023-02-12 DIAGNOSIS — F411 Generalized anxiety disorder: Secondary | ICD-10-CM

## 2023-02-12 NOTE — Progress Notes (Signed)
[facility] Counselor Initial Adult Exam  Name: Dustin Dean Date: 02/12/2023 MRN: [id number] DOB: January 19, 1950 PCP: [name], MD  Time spent: 56 minutes,  2 PM until 2:56 PM  spent in person with the patient in the outpatient therapist office. The patient reports that physically not much has changed.  He is thankful for that.  He is sleeping well although he does nap usually daily for about an hour but feels okay with that.  He has made some more intentional plans he is feeling well and is okay with that.  He had a nephew died unexpectedly in an automobile accident and he said that spurred him to take a closer look at exactly how he wants things to go when the time comes.  He does say that was somewhat triggering but he still is at peace with knowing that when the time comes he will be ready.  His faith is important to him.  He feels that his wife is in a good place.  He continues to do what he enjoys doing with his wife with his family and with his friends.  He continues to remain on the Paxil and says that is maintaining mood stability He does contract for safety having no thoughts of hurting himself or anyone else.   Guardian/Payee: Self  Paperwork requested: Yes   Reason for Visit /Presenting Problem: Depression  . He does contract for safety having no thoughts of hurting himself or anyone else. Mental Status Exam: Appearance:   Fairly Groomed     Behavior:  Appropriate  Motor:  Normal  Speech/Language:   Clear and Coherent and Normal Rate  Affect:  Appropriate  Mood:  normal  Thought process:  normal  Thought content:    WNL  Sensory/Perceptual disturbances:    WNL  Orientation:  oriented to person, place, time/date, situation, and day of week  Attention:  Good  Concentration:  Good  Memory:  WNL  Fund of knowledge:   Good  Insight:    Good  Judgment:   Good  Impulse Control:  Good     Reported Symptoms: Depression  Risk Assessment: Danger to Self:   No Self-injurious Behavior: No Danger to Others: No Duty to Warn:no Physical Aggression / Violence:No  Access to Firearms a concern: No  Gang Involvement:No  Patient / guardian was educated about steps to take if suicide or homicide risk level increases between visits: n/a While future psychiatric events cannot be accurately predicted, the patient does not currently require acute inpatient psychiatric care and does not currently meet [facility] involuntary commitment criteria.  Substance Abuse History: Current substance abuse: No     Past Psychiatric History:   Previous psychological history is significant for depression Outpatient Providers: Primary care physician History of Psych Hospitalization:  None reported Psychological Testing:  n/a    Abuse History:  Victim of: No.,  None reported    Report needed: No. Victim of Neglect:No. Perpetrator of n/a Witness / Exposure to Domestic Violence: None reported  Protective Services Involvement: No  Witness to [employer] Violence:  No   Family History:  Family History  Problem Relation Age of Onset   Cancer Mother    Congestive Heart Failure Father    Prostate cancer Father    Healthy Sister     Living situation: the patient lives with their spouse  Sexual Orientation: Straight  Relationship Status: married  Name of spouse / other: Did not discuss If a parent, number of children /  ages: Adults  Support Systems: spouse  Surveyor, quantity Stress:  No   Income/Employment/Disability: Doctor, general practice: No   Educational History: Education: high school diploma/GED  Religion/Sprituality/World View: Did not discuss  Any cultural differences that may affect / interfere with treatment:  not applicable   Recreation/Hobbies: Time with family  Stressors: Health problems    Strengths: Supportive Relationships, Hopefulness, Self Advocate, and Able to Communicate Effectively  Barriers:     Legal  History: Pending legal issue / charges: The patient has no significant history of legal issues. History of legal issue / charges:  Not applicable  Medical History/Surgical History: reviewed Past Medical History:  Diagnosis Date   Aortic stenosis    Flu    Pneumonia    Prostate cancer (HCC)    Pulmonary fibrosis (HCC)    Skin cancer     Past Surgical History:  Procedure Laterality Date   HERNIA REPAIR     NASAL SEPTUM SURGERY     SKIN CANCER EXCISION      Medications: Current Outpatient Medications  Medication Sig Dispense Refill   acetaminophen (TYLENOL) 325 MG tablet Take by mouth as needed.     aspirin-acetaminophen-caffeine (EXCEDRIN MIGRAINE) 250-250-65 MG tablet Take by mouth as needed.     Calcium Carb-Cholecalciferol (CALCIUM/VITAMIN D PO) Take 1 tablet by mouth daily.     Cetirizine HCl (ZYRTEC PO) Take by mouth.     ESBRIET 801 MG TABS Take 1 tablet (801 mg total) by mouth 3 (three) times daily with meals. 270 tablet 2   fluorouracil (EFUDEX) 5 % cream Apply topically.     fluticasone (FLONASE) 50 MCG/ACT nasal spray Place into both nostrils daily.     gabapentin (NEURONTIN) 300 MG capsule Take 600 mg by mouth 3 (three) times daily.     ipratropium (ATROVENT) 0.06 % nasal spray Place 2 sprays into both nostrils 3 (three) times daily.     loperamide (IMODIUM) 2 MG capsule Take 1 mg by mouth once.     meclizine (ANTIVERT) 25 MG tablet Take 25 mg by mouth 2 (two) times daily as needed.     Multiple Vitamin (MULTIVITAMIN) capsule Take 1 capsule by mouth every morning.     naproxen sodium (ALEVE) 220 MG tablet Take 220 mg by mouth daily as needed.     omeprazole (PRILOSEC) 40 MG capsule Take 40 mg by mouth daily.     PAROXETINE HCL PO Take by mouth.     predniSONE (DELTASONE) 10 MG tablet 30mg  x 5 days; 20mg  x 5 days; 10mg  x 5 days; 5mg  x 5 days; then stop (Patient not taking: Reported on 12/20/2022) 35 tablet 0   rosuvastatin (CRESTOR) 5 MG tablet Take 1 tablet by mouth  daily.     SUMAtriptan (IMITREX) 100 MG tablet TAKE ONE TABLET BY MOUTH AT ONSET OF HEADACHE; MAY REPEAT ONE TABLET IN 2 HOURS IF NEEDED. MAX OF 200 MG PER DAY     No current facility-administered medications for this visit.    Allergies  Allergen Reactions   Bee Venom Rash   Shellfish Allergy Hives   Nintedanib Other (See Comments)    SIRS, fevers   Pseudoephedrine Anxiety    Diagnoses:  Major depressive disorder, recurrent, moderate, irritability Plan of Care: I will meet with the patient every 2 to 3 weeks in person Treatment plan: We will use cognitive behavioral therapy, even though some therapy as well as elements of dialectical behavior therapy to help the patient reduce his  anxiety by at least 50% with a target date of  October 30th 2024.  Goals will be to help the patient better manage anxiety symptoms and stress, identify causes for anxiety including changes in his health and ways to lower the anxiety, resolve the conflicts contributing to anxiety and help him manage thoughts and worrisome thinking contributing to his anxiety.  Interventions include providing education about anxiety to help him understand its causes symptoms and triggers..  We will facilitate problem solution skills as well as coping skills to help him manage anxiety and stress symptoms.  We will use cognitive behavioral therapy to identify and change anxiety provoking thought and behavior patterns as well as dialectical therapy to teach distress tolerance and mindfulness skills. Progress: 35% We will continue with treatment goals as listed above with a new target date of May 15, 2023                                  [place] Ana, [facility]                  French Ana, [facility]               [place] Ana, [facility]               French Ana, [facility]               French Ana, [facility]               French Ana, [facility]               French Ana, [facility]               French Ana, [facility]               French Ana, [facility]               French Ana, [facility]               French Ana, [facility]               French Ana, [facility]               French Ana, [facility]

## 2023-03-04 ENCOUNTER — Encounter: Payer: Self-pay | Admitting: Internal Medicine

## 2023-03-12 ENCOUNTER — Encounter: Payer: Self-pay | Admitting: Behavioral Health

## 2023-03-12 ENCOUNTER — Ambulatory Visit: Payer: Medicare Other | Admitting: Behavioral Health

## 2023-03-12 DIAGNOSIS — R454 Irritability and anger: Secondary | ICD-10-CM | POA: Diagnosis not present

## 2023-03-12 DIAGNOSIS — F411 Generalized anxiety disorder: Secondary | ICD-10-CM

## 2023-03-12 DIAGNOSIS — F331 Major depressive disorder, recurrent, moderate: Secondary | ICD-10-CM | POA: Diagnosis not present

## 2023-03-12 NOTE — Progress Notes (Signed)
  Behavioral Health Counselor Initial Adult Exam  Name: Dustin Dean Date: 03/12/2023 MRN: [id number] DOB: 1950-09-21 PCP: Dustin Canterbury, MD  Time spent: 55 minutes, 10 AM until 10:55 AM spent in person with the patient in the outpatient therapist office. The patient reports that physically not much has changed.  He meets with Dr. Colletta Dean in a couple of weeks and hopes to have some clear answers as to what the doctor thinks might be taking place.  He has been doing some research in a healthy way to see if others who have gone through this supposed to do anything about timelines and expectations.  He already knows the physical signs to look for but wants to know if there is a more gradual or more rapid transition.  He feels that medication helps reduce his anxiety.  He does appear to be processing and handling this well.  He is going through the business to episodic in terms of financial and life care etc.  He and his wife talk about it honestly and openly.  He has not spoken to his adopted son Dustin Dean at length yet but his wife Dustin Dean has their healthcare power of attorney after his wife and they plan to have a detailed conversation with her.  He also needs to change as well as his nephew was in as well and his nephew passed away several months ago.   The patient recently said something about 4 to 5 years and now that he would not be around in her response was "yes you will.  Realizes that is not my probability but did not say anything to her but says he does not like when he feels like people are placating him.  He knows it probably comes from her not being comfortable with that subject.  We talked about how to best handle those that make it easier for the patient.  He appears to be handling his anxiety and depression very well and feels that medication therapy things he is doing at home are keeping that manageable.  He does contract for safety having no thoughts of hurting himself or anyone  else.   Guardian/Payee: Self  Paperwork requested: Yes   Reason for Visit /Presenting Problem: Depression  . He does contract for safety having no thoughts of hurting himself or anyone else. Mental Status Exam: Appearance:   Fairly Groomed     Behavior:  Appropriate  Motor:  Normal  Speech/Language:   Clear and Coherent and Normal Rate  Affect:  Appropriate  Mood:  normal  Thought process:  normal  Thought content:    WNL  Sensory/Perceptual disturbances:    WNL  Orientation:  oriented to person, place, time/date, situation, and day of week  Attention:  Good  Concentration:  Good  Memory:  WNL  Fund of knowledge:   Good  Insight:    Good  Judgment:   Good  Impulse Control:  Good     Reported Symptoms: Depression  Risk Assessment: Danger to Self:  No Self-injurious Behavior: No Danger to Others: No Duty to Warn:no Physical Aggression / Violence:No  Access to Firearms a concern: No  Gang Involvement:No  Patient / guardian was educated about steps to take if suicide or homicide risk level increases between visits: n/a While future psychiatric events cannot be accurately predicted, the patient does not currently require acute inpatient psychiatric care and does not currently meet [facility] involuntary commitment criteria.  Substance Abuse History: Current substance abuse: No  Past Psychiatric History:   Previous psychological history is significant for depression Outpatient Providers: Primary care physician History of Psych Hospitalization:  None reported Psychological Testing:  n/a    Abuse History:  Victim of: No.,  None reported    Report needed: No. Victim of Neglect:No. Perpetrator of n/a Witness / Exposure to Domestic Violence: None reported  Protective Services Involvement: No  Witness to [employer] Violence:  No   Family History:  Family History  Problem Relation Age of Onset   Cancer Mother    Congestive Heart Failure Father    Prostate  cancer Father    Healthy Sister     Living situation: the patient lives with their spouse  Sexual Orientation: Straight  Relationship Status: married  Name of spouse / other: Did not discuss If a parent, number of children / ages: Adults  Support Systems: spouse  Financial Stress:  No   Income/Employment/Disability: Doctor, general practice: No   Educational History: Education: high school diploma/GED  Religion/Sprituality/World View: Did not discuss  Any cultural differences that may affect / interfere with treatment:  not applicable   Recreation/Hobbies: Time with family  Stressors: Health problems    Strengths: Supportive Relationships, Hopefulness, Self Advocate, and Able to Communicate Effectively  Barriers:     Legal History: Pending legal issue / charges: The patient has no significant history of legal issues. History of legal issue / charges:  Not applicable  Medical History/Surgical History: reviewed Past Medical History:  Diagnosis Date   Aortic stenosis    Flu    Pneumonia    Prostate cancer (HCC)    Pulmonary fibrosis (HCC)    Skin cancer     Past Surgical History:  Procedure Laterality Date   HERNIA REPAIR     NASAL SEPTUM SURGERY     SKIN CANCER EXCISION      Medications: Current Outpatient Medications  Medication Sig Dispense Refill   acetaminophen (TYLENOL) 325 MG tablet Take by mouth as needed.     aspirin-acetaminophen-caffeine (EXCEDRIN MIGRAINE) 250-250-65 MG tablet Take by mouth as needed.     Calcium Carb-Cholecalciferol (CALCIUM/VITAMIN D PO) Take 1 tablet by mouth daily.     Cetirizine HCl (ZYRTEC PO) Take by mouth.     ESBRIET 801 MG TABS Take 1 tablet (801 mg total) by mouth 3 (three) times daily with meals. 270 tablet 2   fluorouracil (EFUDEX) 5 % cream Apply topically.     fluticasone (FLONASE) 50 MCG/ACT nasal spray Place into both nostrils daily.     gabapentin (NEURONTIN) 300 MG capsule Take 600 mg  by mouth 3 (three) times daily.     ipratropium (ATROVENT) 0.06 % nasal spray Place 2 sprays into both nostrils 3 (three) times daily.     loperamide (IMODIUM) 2 MG capsule Take 1 mg by mouth once.     meclizine (ANTIVERT) 25 MG tablet Take 25 mg by mouth 2 (two) times daily as needed.     Multiple Vitamin (MULTIVITAMIN) capsule Take 1 capsule by mouth every morning.     naproxen sodium (ALEVE) 220 MG tablet Take 220 mg by mouth daily as needed.     omeprazole (PRILOSEC) 40 MG capsule Take 40 mg by mouth daily.     PAROXETINE HCL PO Take by mouth.     predniSONE (DELTASONE) 10 MG tablet 30mg  x 5 days; 20mg  x 5 days; 10mg  x 5 days; 5mg  x 5 days; then stop (Patient not taking: Reported on 12/20/2022) 35 tablet  0   rosuvastatin (CRESTOR) 5 MG tablet Take 1 tablet by mouth daily.     SUMAtriptan (IMITREX) 100 MG tablet TAKE ONE TABLET BY MOUTH AT ONSET OF HEADACHE; MAY REPEAT ONE TABLET IN 2 HOURS IF NEEDED. MAX OF 200 MG PER DAY     No current facility-administered medications for this visit.    Allergies  Allergen Reactions   Bee Venom Rash   Shellfish Allergy Hives   Nintedanib Other (See Comments)    SIRS, fevers   Pseudoephedrine Anxiety    Diagnoses:  Major depressive disorder, recurrent, moderate, irritability Plan of Care: I will meet with the patient every 2 to 3 weeks in person Treatment plan: We will use cognitive behavioral therapy, even though some therapy as well as elements of dialectical behavior therapy to help the patient reduce his anxiety by at least 50% with a target date of  October 30th 2024.  Goals will be to help the patient better manage anxiety symptoms and stress, identify causes for anxiety including changes in his health and ways to lower the anxiety, resolve the conflicts contributing to anxiety and help him manage thoughts and worrisome thinking contributing to his anxiety.  Interventions include providing education about anxiety to help him understand its causes  symptoms and triggers..  We will facilitate problem solution skills as well as coping skills to help him manage anxiety and stress symptoms.  We will use cognitive behavioral therapy to identify and change anxiety provoking thought and behavior patterns as well as dialectical therapy to teach distress tolerance and mindfulness skills. Progress: 35% We will continue with treatment goals as listed above with a new target date of May 15, 2023                                  [place], [facility] [place]                  [place], [facility]               [place], [facility]               [place], [facility]               [place], [facility]               [place], [facility]               [place], [facility]               [place], [facility]               [place], [facility]               [place], [facility]               [place], [facility]               [place], [facility]               [place], [facility]               [place], [facility]

## 2023-03-19 ENCOUNTER — Other Ambulatory Visit (HOSPITAL_BASED_OUTPATIENT_CLINIC_OR_DEPARTMENT_OTHER): Payer: Self-pay

## 2023-03-19 DIAGNOSIS — J849 Interstitial pulmonary disease, unspecified: Secondary | ICD-10-CM

## 2023-03-21 ENCOUNTER — Ambulatory Visit (HOSPITAL_BASED_OUTPATIENT_CLINIC_OR_DEPARTMENT_OTHER): Payer: Medicare Other | Admitting: Internal Medicine

## 2023-03-21 ENCOUNTER — Encounter: Payer: Self-pay | Admitting: Pulmonary Disease

## 2023-03-21 ENCOUNTER — Encounter: Payer: Self-pay | Admitting: Internal Medicine

## 2023-03-21 ENCOUNTER — Ambulatory Visit: Payer: Medicare Other | Admitting: Internal Medicine

## 2023-03-21 VITALS — BP 123/70 | HR 85 | Temp 98.1°F | Ht 68.0 in | Wt 203.0 lb

## 2023-03-21 DIAGNOSIS — Z952 Presence of prosthetic heart valve: Secondary | ICD-10-CM

## 2023-03-21 DIAGNOSIS — R760 Raised antibody titer: Secondary | ICD-10-CM

## 2023-03-21 DIAGNOSIS — J9611 Chronic respiratory failure with hypoxia: Secondary | ICD-10-CM | POA: Diagnosis not present

## 2023-03-21 DIAGNOSIS — Z7189 Other specified counseling: Secondary | ICD-10-CM

## 2023-03-21 DIAGNOSIS — J849 Interstitial pulmonary disease, unspecified: Secondary | ICD-10-CM

## 2023-03-21 DIAGNOSIS — I2723 Pulmonary hypertension due to lung diseases and hypoxia: Secondary | ICD-10-CM | POA: Diagnosis not present

## 2023-03-21 DIAGNOSIS — I272 Pulmonary hypertension, unspecified: Secondary | ICD-10-CM

## 2023-03-21 LAB — PULMONARY FUNCTION TEST
FEF 25-75 Pre: 2.97 L/s
FEF2575-%Pred-Pre: 129 %
FEV1-%Pred-Pre: 57 %
FEV1-Pre: 1.72 L
FEV1FVC-%Pred-Pre: 125 %
FEV6-%Pred-Pre: 48 %
FEV6-Pre: 1.86 L
FEV6FVC-%Pred-Pre: 106 %
FVC-%Pred-Pre: 45 %
FVC-Pre: 1.86 L
Pre FEV1/FVC ratio: 92 %
Pre FEV6/FVC Ratio: 100 %

## 2023-03-21 NOTE — Progress Notes (Signed)
 Cardiology Dr. Dyane Dustman 02/15/20 Dustin Dean is 73 y.o. year old male with a history of No prior cardiovascular disease who presents to [facility] cardiology for the evaluation of a heart murmur. This patient is a non-smoker and denies a previous history of myocardial infarction, congestive heart failure, or valvular heart disease. 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 He really has little in the way of exertional chest symptoms. Occasionally according to the wife if he walks up an incline briskly he may have some minor shortness of breath but remains active and has little in the way limitation. He denies a previous history of myocardial infarction, congestive heart failure, or known valvular heart disease. At a recent examination he was found to have a heart murmur and an echocardiogram was performed. It demonstrated a peak gradient across his aortic valve of 30 mmHg and an aortic valve area of 1.1 cm. This was read as being consistent with moderate aortic stenosis. I had a fairly significant discussion with the patient and his wife about the implications of his aortic valve and the need to follow the aortic stenosis progression over time.  11/14/2020 office visit with [facility]in Dean pulmonary physician assistant at [facility] has past medical history of restrictive lung disease, interstitial lung disease, allergic rhinitis, laryngeal pharyngeal reflux, arthritis, lung nodules and persistent cough. He is a non-smoker and has never smoked. He has medication allergy to pseudoephedrine.  Dustin Dean presents today with complaint of a progressively worsening shortness of breath and dyspnea on exertion over the past several months. We have been following his interstitial lung disease since 2018. He has been stable without complaint of dyspnea on exertion over the past several years. He indicates that recently he has had more shortness  of breath with exertion. He and his wife will walk the dog in their usual route through the neighborhood that they have done for many years. He reports that he will get short of breath along the root in a way that he has never been bothered with previously. He has recently requested an albuterol inhaler and reports that it has been helpful. He has been using Symbicort 160/4.5 twice daily but has been having difficulty using it successfully as he has been coughing significantly and loses a lot of the medication. A recent chest CT conducted 10/24/2020 notes a progression in the interstitial changes. A 9 mm right lower lobe nodule is also noted. The plan is to have a 108-month follow-up chest CT conducted 02/04/2021. A 6-minute walk test conducted today shows oxygen desaturation and need for supplemental oxygen. I will place the order for supplemental oxygen. I will also refer him to the interstitial lung clinic at [facility] for further evaluation and treatment.   Patient at rest on RA with baseline sats : 97% After exertion for 3 min on RA pt's sats were : 86% Patient placed on oxygen via nasal cannula at 2 lpm flow via portable oxygen concentrator device Exertion for 6 mins with oxygen confirmed final sats at 94%   #1 lung nodules Chest CT 10/24/2020 shows 9 mm right lower lobe lung nodule Follow-up chest CT scheduled for 79-month reevaluation in January 2023  2. Interstitial lung disease Chest CT 11/04/2020 notes fibrotic interstitial lung disease with evidence of progression compared to 08/28/2020 Referral to interstitial lung clinic at [facility] for further evaluation and treatment   #  Primary Care dec 2022 Osteopenia Due for DXA. Prostate cancer Saw Dr. Marlou Dean at [facility]. Biopsy + for cancer. They are just watching it. He is going to have MRI and another biopsy next visit.   OV 01/24/2021 -evaluation and transfer of care to Dr. Marchelle Dean in ILD center in [place].  Referred by  Dustin Dean patient support group leader for the pulmonary fibrosis foundation support group  Subjective:  Patient ID: Dustin Dean, male , DOB: [birth year] , age 73 y.o. , MRN: [id number] , ADDRESS: [address] [place] [address] PCP Dustin Canterbury, MD Patient Care Team: Dustin Canterbury, MD as PCP - General (Family Medicine)  This Provider for this visit: Treatment Team:  Attending Provider: Kalman Shan, MD    01/24/2021 -   Chief Complaint  Patient presents with   Consult    Pt is here for a switch of providers to take over care for his pulmonary fibrosis. 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 Pt does become SOB with activities and also states that he does have a chronic cough.     HPI  Dustin Dean 73 y.o. -new evaluation for interstitial lung disease.  History is provided by review of the chart and also talking to the patient and his wife.  He tells me that he has had chronic cough since his teenage years.  But for the last few to several years its been more consistent.  He was seeing the physician assistant at [facility].  Somewhere along the way he got referred to Dr. Providence Dean right at [facility] some 3 years ago.  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  After that cough is largely improved except for occasional exacerbations in the fall in the winter.  Then in 2018 sometime after he went on gabapentin he was informed by the physician assistant that he might have early ILD.  He does not recollect any serology or biopsy being done.  He does recollect a few pulmonary function test.  Then all of a sudden starting September 2022 his cough "got out of hand" and also developed worsening shortness of breath.  This the first time he started developing worsening shortness of breath.  Then by November 2022 started needing exertional oxygen leading to current symptoms.  He was referred to [facility] but he preferred to establish with the  ILD center here in [place] with [place] based on [facility] recommendation.  He found Dustin Dean through Internet search for pulmonary fibrosis foundation.  His current symptom score is below.  He is not on any antifibrotic.  [facility] Comprehensive ILD Questionnaire       Past Medical History :  -He has obesity BMI 33-34 - Denies any collagen vascular disease - He is known to have aortic stenosis -sees cardiologist at [facility].  December 2021 echocardiogram EF 55 to 60% with tricuspid aortic valve and moderate stenosis normal right ventricle -Denies any asthma or COPD.  Denies any heart failure denies any collagen vascular disease -Denies snoring or excessive daytime somnolence.  Denies stroke.  Denies formal diagnosis of sleep apnea denies formal diagnosis of pulmonary hypertension [echo a year ago did not show pulmonary hypertension] -Has had vaccination against COVID but has not had COVID disease -Recent diagnosis of late 2022 early stage prostate cancer at [facility] on observation treatment -Has history of skin cancer for which he apply sunscreen regularly  ROS:  -Shortness of breath present  Cough chronic plus -Has intermittent chronic diarrhea and takes Imodium but no other GI side effects -Possible Raynaud's for the last several decades  FAMILY HISTORY of LUNG DISEASE:  Denies family for pulmonary fibrosis.  Denies family Struve COPD or asthma sarcoid or cystic fibrosis.  Denies family history of hypersensitive pneumonitis.  Denies autoimmune disease.  Denies any premature graying of the hair or albinism  PERSONAL EXPOSURE HISTORY:  No prior cigarette smoking -No marijuana use of cocaine use no intravenous drug use.  HOME  EXPOSURE and HOBBY DETAILS :  -Single-family home in a one-story space.  [place] Niemann setting age of the current home is 31 years.  He is lived there for 6 years.  He does use a feather pillow and this occasionally mold or mildew in the  bathroom.  All his life he is done active gardening with mulch and woodchips and occasionally damp soil.  He is worked out in the yard quite a bit and does gardening.  He enjoys the gardening.  OCCUPATIONAL HISTORY (122 questions) : -He worked in the [employer] at [employer] and then [employer] no [employer].  He does not remember if the buildings had mold in it.  He does not think so -Detail greater than 100 question organic and inorganic antigen history exposure is negative  PULMONARY TOXICITY HISTORY (27 items):  Denies  INVESTIGATIONS: Report of pulmonary function test but I am not able to get it in Care Everywhere       High-resolution CT scan of the chest August 2020  -There is a report of honeycombing but without any craniocaudal gradient.  Air-trapping reported alternative pattern more suggestive of chronic hypersensitive pneumonitis.  But there is already progression from August 2019 -Read by Dr. Trudie Reed   CT Chest data- HRCT 11/04/20 unable for my visualization.   CLINICAL DATA:  Worsening shortness of breath   EXAM:  CT CHEST WITHOUT CONTRAST   TECHNIQUE:  Multidetector CT imaging of the chest was performed following the  standard protocol without intravenous contrast. High resolution  imaging of the lungs, as well as inspiratory and expiratory imaging,  was performed.   COMPARISON:  Chest CT dated August 29, 2018   FINDINGS:  Cardiovascular: Normal heart size. No pericardial effusion. Coronary  artery calcifications of the circumflex. Aortic valve  calcifications. Minimal calcified plaque of the thoracic aorta.   Mediastinum/Nodes: Mildly enlarged mediastinal lymph nodes,  unchanged compared to prior exam and likely reactive. Esophagus and  thyroid are unremarkable.   Lungs/Pleura: Central airways are patent. Bilateral air trapping.  Peribronchovascular and subpleural reticular opacities with traction  bronchiectasis and  no clear craniocaudal predominance. Honeycomb  change primarily seen in the anterior upper lobes is increased when  compared to prior exam. New linear nodular opacity of the right  lower lobe measuring up to 9 mm on series 4, image 210.   Upper Abdomen: No acute abnormality.   Musculoskeletal: No chest wall mass or suspicious bone lesions  identified.   IMPRESSION:  Fibrotic interstitial lung disease with evidence of progression when  compared with August 29, 2018 prior exam. Favor fibrotic  hypersensitivity pneumonitis given presence of air trapping.  Findings are suggestive of an alternative diagnosis (not UIP) per  consensus guidelines: Diagnosis of Idiopathic Pulmonary Fibrosis: An  Official ATS/ERS/JRS/ALAT Clinical Practice Guideline. Am Rosezetta Schlatter  Crit Care Med Vol 198, Iss 5, [phone], Sep 15 2016.   New linear nodular opacity of the right lower lobe measuring  up to 9  mm, likely an area of focal fibrosis. Recommend follow-up chest CT  in 3 months to ensure stability.   Aortic Atherosclerosis (ICD10-I70.0).   Addendum 01/26/2021 -radiology Dr. Llana Aliment wrote back saying CT scan is classic for chronic HP.  We will call the patient and get the patient started on nintedanib counseling which is first-line for progressive non-- IPF ILD.  If he is reluctant then we will do pirfenidone.  We will set up pharmacy counseling.  If he is reluctant for nintedanib based on risk versus benefit we will do pirfenidone  Electronically Signed    By: Allegra Lai M.D.    On: 11/04/2020 14:17  No results found.   Phone visit 01/28/21  Emilyh  LEt Dustin Dean know ahead of the visit followup with me on 02/23/21   A) making referral to [facility] to discuss ofev which would be first line baed on what radiologist told me about CT and fact rheumaotid facot is positive  B) rheumatoid factor strongly positive - refer rheumatology Dr Deveshwar/Rice  C) I reviewed echo report (below) Will discuss  at followup  Aortic Valve: There is moderate stenosis, with peak and mean gradients  of 44.000 and 27.000 mmHg.  Aortic valve area calculates to approximately  1.4 cm    Mitral Valve: There is mild regurgitation.    Tricuspid Valve: There is mild regurgitation.    Tricuspid Valve: The right ventricular systolic pressure is normal (<36  mmHg).   1.  Adequate 2D M-mode and color flow Doppler echocardiogram demonstrates  normal left ventricular chamber size and contractility.  There may be mild  left ventricular hypertrophy.  Ejection fraction is estimated 65%  2.  The aortic valve is thickened and demonstrates reduced leaflet  mobility.  There is mild to moderate aortic stenosis with aortic valve  area calculating to 1.4 cm  3.  Mild mitral annular calcification is noted but there is good leaflet  mobility.  Mild mitral insufficiency is noted  4.  Tricuspid valve appears to be normal  5.  The atria are grossly normal size bilaterally  6.  The right ventricular chamber size and function is normal  6.  The pericardium is of normal thickness there is no effusion  OV 02/23/2021  Subjective:  Patient ID: Dustin Dean, male , DOB: 05-28-1950 , age 89 y.o. , MRN: [id number] , ADDRESS: [address] [place] [address] PCP Dustin Canterbury, MD Patient Care Team: Dustin Canterbury, MD as PCP - General (Family Medicine)  This Provider for this visit: Treatment Team:  Attending Provider: Kalman Shan, MD    02/23/2021 -   Chief Complaint  Patient presents with   Follow-up    Pt recently had a HRCT and PFT and is here today to discuss the results.   Chronic cough on gabapentin  Interstitial lung disease work-up in progress -concern for hypersensitive pneumonitis  -Progressive phenotype between August 2020 and October 2022  9 mm right lower lobe nodule seen in October 2022 for the first time: Stable January 2023  HPI Dustin Dean 73 y.o. -since his last visit Dr. Llana Aliment the  radiologist did look at the CT scan and this confirmed his CT is not consistent with UIP but more likely an alternate pattern consistent with hypersensitive pneumonitis as seen by air trapping.  Patient has had serology work-up in which it is all negative except rheumatoid factor strongly positive.  He is yet to see the rheumatologist.  He does have  significant restrictions on the PFTs with reduced DLCO consistent with his obesity and ILD.  His symptoms are overall stable.  Did go through his exposure history again.  He says in 200 07/2006 and again in 2012 he had had 3 episodes of pneumonia.  All of this happened when he entered school buildings.  He was living in the same house at that time but the house itself was brand-new and there is no mold or mildew.  He is wondering about crawlspace because only [place] he has seen crawlspace but he said he has never been in the crawl space.  He does not know if there was mold in the crawl space.  I told him it is doubtful that mold or mildew from the crawlspace can get into the house.  He did have a feather pillow but since his last visit he is thrown that out.  He does do gardening and does get exposed to dampness.  At this point in time did discuss with him that he does have ILD and it is progressive.  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  Unclear if the positive rheumatoid factor is playing a role it could be if he has positive for rheumatoid arthritis.  Did indicate that ultimately only a biopsy can put to rest if he has IPF [being Caucasian gentleman greater than 65 is classic phenotype for IPF] but did indicate to him that given progression does not matter whether it is IPF or non-IPF antifibrotic's is indicated.  He is already met with the pharmacist and discussed the 2 antifibrotic's.  Did indicate to him that nintedanib is first-line and non-- IPF progressive phenotype.  Did discuss about the diarrhea.  Denies any  contraindication.  Therefore we will start this.  Did discuss whether diarrhea is willing to go through this.  Also discussed about ILD-Pro registry.  He is interested given the consent form.  Did indicate to him that it probably would be beneficial to do some kind of a limited work-up in differentiating his etiology.  The reasons would be to participate in clinical trials but also in case he needs immunosuppression such as prednisone or CellCept [indicated for hypersensitive pneumonitis but not for IPF] having a specific diagnosis would be beneficial.  I did think that his obesity puts him at some risk for getting surgical lung biopsy and therefore it might be better to start off with bronchoscopy with lavage and transbronchial biopsies.  We can address this in the future.  This visit was just focused on therapies.  Regarding his 9 mm lower lobe nodule: He had a CT scan of the chest and shows this is stable from October 2022 through January 2023.  The CT scan again reads as alternative pattern consistent with chronic HP.    CT Chest data January 2023  No results found.  Mediastinum/Nodes: Multiple prominent borderline but nonenlarged mediastinal and bilateral hilar lymph nodes, similar to the prior study esophagus is unremarkable in appearance. No axillary lymphadenopathy.   Lungs/Pleura: High-resolution images again demonstrate widespread but patchy areas of ground-glass attenuation, septal thickening, subpleural reticulation, thickening of the peribronchovascular interstitium, cylindrical and varicose traction bronchiectasis, peripheral bronchiolectasis and extensive honeycombing. There is no discernible craniocaudal gradient. The most extensive areas of honeycombing are anterior lung, predominantly in the upper lobes and right middle lobe. Some areas of the lung bases demonstrate relative sparing. Inspiratory and expiratory imaging demonstrates mild to moderate air trapping indicative of  small airways disease. No definite  progression compared to the recent prior study. No acute consolidative airspace disease. No pleural effusions. Previously described nodular area of architectural distortion in the right lower lobe (axial image 106 of series 8) is stable measuring 8 mm on today's examination, likely part of the underlying fibrosis. No other definite suspicious appearing pulmonary nodules or masses are noted.   Upper Abdomen: Unremarkable.   Musculoskeletal: There are no aggressive appearing lytic or blastic lesions noted in the visualized portions of the skeleton.   IMPRESSION: 1. 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 2. Previously noted nodular area of architectural distortion in the right lower lobe is unchanged, likely part of the fibrosis rather than a pulmonary nodule. 3. There are calcifications of the aortic valve and mitral annulus. Echocardiographic correlation for evaluation of potential valvular dysfunction may be warranted if clinically indicated. 4. Aortic atherosclerosis, in addition to 2 vessel coronary artery disease. Please note that although the presence of coronary artery calcium documents the presence of coronary artery disease, the severity of this disease and any potential stenosis cannot be assessed on this non-gated CT examination. Assessment for potential risk factor modification, dietary therapy or pharmacologic therapy may be warranted, if clinically indicated.   Aortic Atherosclerosis (ICD10-I70.0).     Electronically Signed   By: Trudie Reed M.D.   On: 02/14/2021 06:17 stable January 2023  PFT       Latest Reference Range & Units 01/27/21  15:53 01/27/21 15:54  Anti Nuclear Antibody (ANA) NEGATIVE   NEGATIVE  Angiotensin-Converting Enzyme 9 - 67 U/L  49  Anti JO-1 0.0 - 0.9 AI  <0.2  Cyclic Citrullin Peptide Ab UNITS  <16  ds DNA Ab IU/mL  1  ENA RNP Ab 0.0 - 0.9 AI  0.4  RA Latex Turbid. <14 IU/mL  323 (H)  SSA (Ro) (ENA) Antibody, IgG <1.0 NEG AI  <1.0 NEG  SSB (La) (ENA) Antibody, IgG <1.0 NEG AI  <1.0 NEG  Scleroderma (Scl-70) (ENA) Antibody, IgG <1.0 NEG AI  <1.0 NEG  QUANTIFERON-TB GOLD PLUS   Rpt  A.Fumigatus #1 Abs Negative  Negative   Micropolyspora faeni, IgG Negative  Negative   Thermoactinomyces vulgaris, IgG Negative  Negative   A. Pullulans Abs Negative  Negative   Thermoact. Saccharii Negative  Negative   Pigeon Serum Abs Negative  Negative   (H): Data is abnormally high     OV 03/23/2021- acute video visit due to concerns for acute symptoms ? Ofev side effect  Subjective:  Patient ID: Dustin Dean, male , DOB: [birth year] , age 41 y.o. , MRN: [id number] , ADDRESS: [address] [place] [address] PCP Dustin Canterbury, MD Patient Care Team: Dustin Canterbury, MD as PCP - General (Family Medicine)  This Provider for this visit: Treatment Team:  Attending Provider: Kalman Shan, MD  Type of visit: Video Circumstance: COVID-19 national emergency Identification of patient Dustin Dean with [birth year] and MRN [id number] - 2 person identifier Risks: Risks, benefits, limitations of telephone visit explained. Patient understood and verbalized agreement to proceed Anyone else on call: just patient Patient location: Inpatient bed at [facility] which is another [facility] system This provider location: [address] pulmonary office [address]  [place], [place] [address].   03/23/2021 -in this video visit was set up  on acute basis because he started having symptoms after starting nintedanib.  He wanted to know if it was nintedanib symptoms.  The symptoms sound rather complex and little bit  out of proportion for nintedanib.  Therefore I scheduled this video visit and to my surprise he is sitting in a hospital bed at [facility] hospital.    HPI Dustin Dean 73 y.o. - started ofev mid-feb 2023. Started noticing feeling disoriented and at times weak. Then on 3/4-03/19/21 was feeling very weak whole bidy weakness and needed walker to even go across room. Tired. Stopped going to gym. Was using o2 all the time. Then on 03/21/21 feelign cold. ANd ahd fever  102F.  Does not remember Tuesday much. Initialyt Dx is pneumonia and testing/imaging -> by tthen pccm saw him Dr Eual Fines. Procal negative. WBC normal. Concerns is ILD flare up and recommendation is prednisone.  CT scan did not show pneumonia Curently on IV solumedrol. Currently some better. Yesterday ate well. Currently on 2.5LN . He has been covid negative. Multiplex resp virus panel negative per outside chart revie  With ofev was having some nausea. No abd pain. No diarrhea. No stomach cramps. No vomitting   Currently ofev on hold    03/28/2021 Follow up : ILD , Post hospital follow up  Patient returns for a 1 week follow-up visit.  Patient was seen in January 2023 for second opinion for interstitial lung disease.  Patient was felt to have possible concern for hypersensitivity pneumonitis.  He has had a progressive phenotype noted on CT scan from August 2020 to October 2022.  Serology was negative except for a strongly positive rheumatoid factor.  He was referred to rheumatology.  PFT showed significant restriction and reduced DLCO.  Has a history of recurrent pneumonia.  He was recommended to begin antifibrotic's. With OFEV.  Patient was hospitalized last week with severe dyspnea, weakness, activity intolerance to point he could not walk. Confusion, low oxygen levels, and fever-tmax 102. Lorie Phenix he has a viral illness and ILD flare.  Patient was hospitalized at an outlying hospital.  He was treated with steroids.  Discharged on a  steroid taper. CT chest 03/22/21 report showed fibrotic ILD, stable since 10/2020, no acute consolidation.  Covid , Influezna  and Viral panel were neg.  Since getting out of the hospital patient is feeling much better. Energy level has picked, decreased oxygen demands, no fever. Appetite is some better . Patient is concerned this could have been from [facility] as his symptoms started when he started OFEV.  Is on Oxygen with activity and At bedtime  2l/m . At rest O2 sats are >90%.      MDD 03/28/21 MDD Impression/Recs: Recent flare up. Bx can be prohibitive. get rheum eval and if neg - manage as HP. Maybe at the most BAL if stable  OV 04/05/2021  Subjective:  Patient ID: Dustin Dean, male , DOB: Dec 17, 1950 , age 34 y.o. , MRN: [id number] , ADDRESS: [address] [place] [address] PCP Dustin Canterbury, MD Patient Care Team: Dustin Canterbury, MD as PCP - General (Family Medicine)  This Provider for this visit: Treatment Team:  Attending Provider: Kalman Shan, MD    04/05/2021 -   Chief Complaint  Patient presents with   Follow-up    Pt states he is feeling better after last hospital stay and states each day is getting better.   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 -returns for followup. He is on 1.5 tab of pred curently . Next week is 1 tab pred and off. He is better. Feeling better. AT rest not using o2 because he is better. In fact symp score shows huge imrpovement. Walking desat test is also largey imprpvd. All ? Steroid effects. We had conversation about his recent flare up  - has hx of recurrent pna: 2005-2006 x 2 episodes. . Lived in a different  house. Then again pneumonia 5 years ago in same house. Rx in urgent care. Had high fever. Current started abruptly 2 weeks into ofev andhe wondering if due to ofev. Did not have classic Ofev sid effects. Mentioned to him Ofev reduces riks of flare.  - gardening: no longer gardening  - featherpillow - got rid of it even after last visit   -visible mold - denies but he discussed his crawl space. Says "there is no mold bu fungs on the top wall". 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 Biopsy indicated if stable but first rheum consult    04/26/2021- Interim hx  Patient presents today for ILD follow-up. He has been on OFEV for three weeks, currently taking 150mg  twice daily. He has been off oral prednisone for two weeks. Breathing is not significantly worse off steroids. Cough is the same. Since Saturday he developed chills, fatigue and low grade fever at night. Temp 100.1-100.3. He reprots some heavy breathing. Oxygen 92% or higher. Urinary frequency. Having some incontinence. Continues on Gabapentin for chronic cough. Referred to rheumatology, has an apt beginning of June. He has been avoid exposures mold/mildew, gardening.     05/02/2021 Follow up : ILD , Fever  Patient presents for an acute office visit.  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  Serial CT chest from August 2020 to August 2022 showed progressive changes.  Serology was negative except for a positive rheumatoid factor.  He has been referred to rheumatology with consult pending.  Pulmonary function testing showed significant restriction and decreased DLCO.  Patient started Ofev on March 01, 2021.  Patient was admitted early March with severe weakness dyspnea and activity intolerance.  He also had intermittent confusion hypoxemia and a fever Tmax of 102.  He was felt to have an ILD flare with possible  viral illness however COVID, influenza and viral panel were all negative.  He was treated with steroids.  CT chest showed fibrotic ILD stable since October 2022.  His Ofev was held during admission.  After discharge he said that his energy level was improved and had decreased oxygen demands.  Based on oxygen after discharge was oxygen 2 L with activity and at bedtime.  Not requiring oxygen at rest with O2 saturations greater than 90%.  Patient was restarted on Ofev April 05, 2020.  After 2 weeks of been on Ofev patient developed low-grade fever.  This is waxed and waned and yesterday developed a fever Tmax of 103.  Patient went to the emergency room work-up was unrevealing.  CT chest showed stable ILD with no acute process.  CBC was normal blood cultures  are no growth to date.  Respiratory viral panel was negative.  Lactic acid was 1.5 urinalysis was normal, urine culture is pending.  LFTs were slightly elevated.  Since last night patient says he is feeling better. Fever is resolved. Took Tylenol . O2 saturations are 100%.  Has some sore throat that started late night .  Had good appetite today . No fever today . No increased Oxygen demands.  Wife is [name].  Patient denies any rash, skin lesions, skin redness, joint swelling, insect or tick bite, travel, discolored mucus, abdominal pain, nausea vomiting or diarrhea. Has no previous joint replacement or surgeries. Abdominal hernia repair >62yrs ago. No abd pain,  Followed by cardiology at [facility].  2D echo January 25, 2018 3 aortic valve is thickened with mild to moderate aortic valve stenosis.,  EF 55 to 60%.  Right ventricle is normal systolic function is normal.     OV 05/19/2021  Subjective:  Patient ID: Dustin [facility], male , DOB: 1950-05-10 , age 62 y.o. , MRN: [id number] , ADDRESS: [address] [place] [address] PCP Dustin Canterbury, MD Patient Care Team: Dustin Canterbury, MD as PCP - General (Family Medicine)  This Provider for this visit:  Treatment Team:  Attending Provider: Kalman Shan, MD    05/19/2021 -   Chief Complaint  Patient presents with   Follow-up    PFT performed today.  Pt states since stopping the OFEV, he has not had anymore fever and states overall he has felt good.    Chronic cough on gabapentin  - well controlled  Interstitial lung disease work-up in progress -concern for hypersensitive pneumonitis  -Progressive phenotype between August 2020 and October 2022   - started ofev mid feb 2023 -> early march 2023 admitted for ILD flare ->   9 mm right lower lobe nodule seen in October 2022 for the first time: Stable January 2023  HPI Dustin Dean 73 y.o. -returns with wife [name].  He tells me that after last visit he did a rechallenge with nintedanib and he picked up fever and ended up in the emergency room very similar to March 2023 hospitalization.  Symptoms started 1-2 weeks into taking nintedanib.  His wife feels strongly this is nintedanib related.  Have not seen the patient gets significant side effects from nintedanib but have seen one of the patient gets something similar with pirfenidone.  That particular patient had even after 1 dose.  I think the strength of evidence here is that this systemic inflammatory response is probably related to nintedanib.  Therefore he has stopped it.  I have advised him against taking nintedanib ever again.  Clinically he is feeling stable.  His walking desaturation test is stable.  His pulmonary function test is also stable.  He uses 2 L with exertion but today in office when we walked him on room air he dropped 7 points and this is similar to before.  He wants to try pirfenidone.  We have discussed this in the past.  We also discussed prednisone but he does not want to do prednisone because of hyperglycemia.  We discussed the risk, benefits and limitations of pirfenidone.  Pirfenidone is not well studied in chronic HP.  Is only studied in 1 subtype of non-IPF  progressive phenotype.  Nevertheless is the only other antifibrotic option available.  He wants to try a donor sample.  We will give him 2 months worth.  He is traveling to the beach  once he Dean back  he will take it    07/03/2021 Follow up : ILD  Patient returns for a 6-week follow-up.  Patient has underlying interstitial lung disease with progressive phenotype.  Serial CT chest from August 20 to August 2022 showed progressive changes.  Serology was negative except for positive rheumatoid factor.  Rheumatology consult is pending.  Previous PFTs have showed significant restriction and decreased diffusing capacity.  Patient was started on Ofev March 01, 2021.  Patient had a SIRS like response to OFEV x 2 .  Ofev has been stopped indefinitely and advised not to restart.  Patient was placed on a drug holiday.  Started on Esbriet 1 month ago.  Patient says since starting Esbriet he seems to be tolerating very well.  LFTs prior to start had returned back to normal.  Previously were elevated.  Patient says he has had minimal nausea.  No diarrhea no upset stomach and appetite seems to be normal.  Patient says he has been using sunscreen when he is out side.  Has had no rash.  We discussed returning for ongoing labs. Patient has been referred to pulmonary rehab but has not contacted today. Patient says overall he is doing okay.  Wears his oxygen with activity as needed.  And at bedtime.  Has had no increased oxygen demands. Patient says he still gets short of breath with heavy activities.  But feels like he is stable at this point.  Has had no return of fever.      OV 09/08/2021  Subjective:  Patient ID: Dustin Dean, male , DOB: 12/07/1950 , age 44 y.o. , MRN: [id number] , ADDRESS: [address] [place] [address] PCP Dustin Canterbury, MD Patient Care Team: Dustin Canterbury, MD as PCP - General (Family Medicine)  This Provider for this visit: Treatment Team:  Attending Provider: Kalman Shan, MD    09/08/2021 -   Chief Complaint  Patient presents with   Follow-up    Pt states he has been doing okay since last visit and denies any complaints.      HPI Dustin Dean 73 y.o. -returns for follow-up.  He tells me that he is lost more weight.  He is stable.  His symptom score shows good stability.  He is tolerating pirfenidone really well.  He has lost weight.  Current BMI is 31 but it is all intentional with the help of exercise, pirfenidone and also eating less calories.  No side effects from pirfenidone no sunburn nothing.  Is taking 3 to 2 pills 3 times daily.  He is willing to roll over to 1 big pill 3 times daily.  His main questions were -He wants to do some gardening such as cut wine but he assures me that will not be any exposure to organic dust.  -He wants to donate his body after death to medical school.  I advised him that either [facility] or [facility] is fine.  -He also has aortic stenosis.  He saw cardiology today at [facility].  He saw Dr. Dyane Dustman.  He is being referred to surgery at [facility] for TAVR consideration.  According to the discussion [not clearly apparent in the chart review] TAVR is being recommended early because of his pulmonary fibrosis.  I did explain to him that in advance pulmonary fibrosis surgical risk is high but did want him to ask the surgeon if there is any downside to operating or placing TAVR procedure when the aortic stenosis is not  critical.  PFT   OV 11/17/2021  Subjective:  Patient ID: Dustin [facility], male , DOB: [birth year] , age 47 y.o. , MRN: [id number] , ADDRESS: [address] [place] [address] PCP Dustin Canterbury, MD Patient Care Team: Dustin Canterbury, MD as PCP - General (Family Medicine)  This Provider for this visit: Treatment Team:  Attending Provider: Kalman Shan, MD    11/17/2021 -   Chief Complaint  Patient presents with   Follow-up    PFT performed today.  Pt  states he has multiple concerns to discuss.     HPI Dustin Dean 73 y.o. -returns for follow-up.  He is doing well overall.  However for the last 3 days he has had a runny nose and with that he is having decreased energy.  He is got decreased social interest.  He also feels his memory is failing him more in the last 3 days he has noted some memory changes for the last several months but it is a lot worse in the last 3 days since the runny nose.  His COVID is negative but there is no fever brown sputum or yellow sputum.  There is no significant drainage.  There is no worsening shortness of breath there is no worsening cough there is no chills.  In addition he has new complaint of tender itchy scalp for the last 3 weeks there is no rash but he has an appointment with dermatologist because his scalp is dry and flaky.  He is not doing gardening but he does go out occasionally in the sun and he does not wear sunscreen or hat.  He is on pirfenidone which can cause skin rash and especially in the setting of sun exposure.  I did caution him this could be because of pirfenidone but he is going to see a dermatologist anyways.  Outside of this he is tolerating his pirfenidone well.  I did indicate to him he can roll himself into 1 big pill 3 times daily instead of 3 small pills 3 times daily for a total of 9 pills.  He also told me that he is at risk for depression.  He says in the past he has had significant depression he is on antidepressant.  He is worried this can come back with his ILD.  We talked about seeing Georgiann Mohs clinical psychologist who specializes in medical issues he is willing to see him.  Also discussed about doing light therapy  Preoperative evaluation: He has aortic stenosis.  He is saying now that it could be critical.  Apparently TAVR procedure is being planned date unknown.  Currently because of the cold he has suspended pulmonary rehabilitation he wants to know if he can go back.  I did  tell him we need to get clearance from the cardiologist because of critical aortic stenosis history I thought it was moderate in the past.          OV 02/26/2022  Subjective:  Patient ID: Dustin [facility], male , DOB: May 09, 1950 , age 76 y.o. , MRN: [id number] , ADDRESS: [address] [place] [address] PCP Dustin Canterbury, MD Patient Care Team: Dustin Canterbury, MD as PCP - General (Family Medicine)  This Provider for this visit: Treatment Team:  Attending Provider: Kalman Shan, MD    HPI Dustin Dean 73 y.o. -presents for follow-up.  Presents with his wife.  His wife is an independent historian today.  Since I last saw him in November 2023 several issues have  happened.  1 in mid November 2023 had sinus congestion and he was given doxycycline and prednisone.  Then in mid December 2023 he had TAVR procedure for his aortic stenosis.  He says this went really well and his murmur has now resolved.  Then straight after Christmas 2023 he had COVID-19.  WE called in molnupiravir.  He is tolerating his pirfenidone at 801 mg pill 3 times daily without any problems.  He has lost some weight intentionally.  He does admit that after all this he is slightly more short of breath than before..  In fact when he walked him he started desaturating.  This is a new finding.  He had pulmonary function test today that shows significant decline in his DLCO.  This was done today and is captured below.  His most recent hemoglobin on 02/20/2022 was reviewed done at [facility].  Was 13.6 g% and normal.  We discussed the possible need of right heart catheterization.  When he was in the office he did not recall that he actually had a right heart catheterization.  After he left the office when I reviewed the records I noticed that in October 2023 he did have heart catheterization.  At that time he did have a right heart catheterization before the TAVR.  In this the pulmonary capillary wedge pressure was 11 mmHg,  pulmonary artery mean pressure was 27 mmHg and elevated, Fick cardiac index was 3.3 mL.  It was in this catheterization that they saw that he had severe aortic valve stenosis with aortic valve area of 1.05 cm and a valve gradient of 40 mmHg.  He did have follow-up echocardiogram on 02/08/2022.  His left ventricular ejection fraction is 55-60%.  Right ventricle is normal.  There is TAVR bioprosthetic valve.  No pericardial effusion.     Last Weight  Most recent update: 02/26/2022 10:35 AM    Weight  93.4 kg (205 lb 12.8 oz)              Modified Six Minute Walk - 02/26/22 1000     Type of O2 used  O2 3/L   POC pulsed oxygen   Number of laps completed  3    Lap Pace Moderate    Resting Heartrate 83 bpm    Final Heartrate 113 bpm    Resting Pulse Ox 94 %   room air. Patient feels he did not need his POC on at this time.   Desaturated to <= 3 points Yes    Desaturated to < 88% Yes    Distance walked when desaturation occurred  240 feet   patient completed one lap.   Became tachycardic Yes    Symptoms  SOB after one lap on room air.  Applied 3 lpm POC.  SaO2 back to 95%.  patient was able to complete remaining 2 laps - 3 laps total.    Was the O2 correction test done? Yes    Amount of O2 needed to correct destauration 3 L    Distance walked without destat below 88%  480 feet   patient walked remaining 2 laps on 3 lpm POC pulsed oxygen   comments patient desat after 1 lap to 88% room air.  POC oxygen applied  at 3 lpm pulsed.  patient completed remaining 2 laps SaO2 95%.            OV 04/08/2022  Subjective:  Patient ID: Dustin [facility], male , DOB: October 19, 1950 , age 56 y.o. , MRN:  [id number] , ADDRESS: [address] [place] [address] PCP Dustin Canterbury, MD Patient Care Team: Dustin Canterbury, MD as PCP - General (Family Medicine)  This Provider for this visit: Treatment Team:  Attending Provider: Kalman Shan, MD   02/26/2022 -   Chief Complaint  Patient presents  with   Follow-up    COVID end of December 2023.  Aortic valve replacement (TAVR)  01/02/22.  Cough persistent.  Runny nose improved after seeing ENT.    Latest Reference Range & Units 02/17/16 19:20 04/26/21 15:31 05/01/21 19:55 12/20/21 12:34 01/09/22 23:37  Eosinophils Absolute 0.0 - 0.5 K/uL 0.4 0.3 0.2 0.6 0.4    Anxiety and depression  -Seeing Jeris Penta psychology  04/08/2022 -   Chief Complaint  Patient presents with   Follow-up    Pt is here for follow up. Pt states no adverse effects noted so far for the Esbriet.      HPI Dustin Dean 73 y.o. - returns for followup. Still on esbriet. Symotoms stable. Was suppsoed to have had RHC but on day of procedure DrSam Turner had multiple other patient emergencies. So it is rescheduled. Date pending.  Here to review PFT and CT Due to concern for progression. CT is definitel progressed from a year ago - despite esbriet.  PFT worse since a year ago but since covidd ? Rebounding back. We discuseed disease progression . Eplained next step is to try immune modulator. 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 We took shared decision to awaith RHC. Reommended he hold off clinical trial at this point. Cough still + but says is better with gabapentin. He does have high eos and we will discuss this next visit.  Obesity: losing weight  Wife also present and was independent historian     OV 05/22/2022  Subjective:  Patient ID: Dustin [facility], male , DOB: 10-02-1950 , age 9 y.o. , MRN: [id number] , ADDRESS: [address] [place] [address] PCP Dustin Canterbury, MD Patient Care Team: Dustin Canterbury, MD as PCP - General (Family Medicine)  This Provider for this visit: Treatment Team:  Attending Provider: Kalman Shan, MD -   05/22/2022 -   Chief Complaint  Patient presents with   Follow-up    F/up on PFT      HPI Keymari Sato 73 y.o. -returns for follow-up for his above issues.  Since his last visit he had his right heart catheterization.  It confirmed pulmonary hypertension present even after his TAVR procedure.  We ordered treprostinil.  He got the package last week and is going to start it tonight after the [place] therapeutics agent visits with him and sets up the package for him.  He is looking forward to starting it.  Currently tolerating pirfenidone well.  He is going through cardiac rehab.  He states his stamina is improved his weight has come down.  His shortness of breath is better.  All reflected below.  Even the cough is better on Neurontin.  He is now seeing counselor Serafina Mitchell and is feeling better.  He had pulmonary function test and it shows continued recent stability.  This is also described below.  He continues to use portable oxygen.  Recently went to [facility] and did some unloading and he did not need oxygen.  Today made him do a sit/stand test x 10 times.  He  did not desaturate below 93%.  OV 06/19/22  7-year-old male followed for interstitial lung disease with progressive phenotype (diagnosed in 2019) Seen for pulmonary consult to establish for ILD in January 2023.  Has chronic respiratory failure on oxygen History of prostate cancer.  History of aortic valve stenosis followed by cardiology at [facility] video visit is a 1 month follow-up for pulmonary hypertension related ILD patient says since last visit he is doing better.  Has completed pulmonary rehab.  And now is beginning a new exercise class at local gym.  Patient remains on Esbriet.  He says he has no issues.  Appetite is good with no nausea vomiting or diarrhea.  He was recently started on Tyvaso. Patient is doing well. Completing pulmonary rehab.  Going to a new exercise class Tolerating Tyvaso well.  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  Remains on oxygen  2 L with activity and at bedtime.  OV 08/21/2022  Subjective:  Patient ID: Dustin [facility], male , DOB: March 17, 1950 , age 18 y.o. , MRN: [id number] , ADDRESS: [address] [place] [address] PCP Dustin Canterbury, MD Patient Care Team: Dustin Canterbury, MD as PCP - General (Family Medicine)  This Provider for this visit: Treatment Team:  Attending Provider: Kalman Shan, MD  08/21/2022 -   Chief Complaint  Patient presents with   Follow-up    Currently in hosp. wants to discuss treatment.    Type of visit: Video Virtual Visit Identification of patient Dustin Dean with July 11, 1950 and MRN [id number] - 2 person identifier Risks: Risks, benefits, limitations of telephone visit explained. Patient understood and verbalized agreement to proceed Anyone else on call: just patient Patient location: [facility] This provider location: [address]; [place]; [place] [address]. [facility]. [phone]     HPI Chuong Casebeer 73 y.o. -on this video visit as a follow-up video visit because he is now an inpatient at [facility] which is not part of [place].  He converted the onsite visit to a video visit.  He told me 08/16/2022 and confirmed on review of the external medical records he got admitted with sudden desaturations.  He has been treated with steroids he was requiring 6 L.  He has somewhat improved.  He wants to get discharged but he says when they walked him with 3 L nasal cannula he desaturated. Labs at [facility] creat normal. CTA angio - negative for PE. Says RVP negaive. He feels he is getting better. He wants to go home. I d/w  over phone with Dr Oliver Pila of ccm there. Not on his rounding list but will review with primary/. Care everywhere does not indicate echo/bnp - indicated for this to be done + Consider HRCT + PCCM consult there with Dr Shelby Mattocks Aug 2024   09/05/2022 Patient  presents today for hospital follow-up.  Patient was admitted for community-acquired pneumonia and discharged on 08/22/22. He originally presents to ED with low oxygen levels. CTA negative for PE, possible infiltrate.  He completed course of cefepime and doxycycline. He had a video visit with Dr. Marchelle [facility] on 08/21/22 while admitted to [facility]. He was advised to stay on prednisone until follow-up in clinic.He is oxygen dependent for hx chronic respiratory failure. He was discharged on 6L oxygen; he is currently using 2L oxygen at rest and 3L with exertion. Needs  large home oxygen concentrator. Continues Esbriet and Tyvaso as directed. Patient had PFT on July 30th prior to admission. Echo/ bnp norm   OV 12/20/2022  Subjective:  Patient ID: Dustin [facility], male , DOB: 12-07-1950 , age 9 y.o. , MRN: [id number] , [address]: [address] [place] [address] PCP Dustin Canterbury, MD Patient Care Team: Dustin Canterbury, MD as PCP - General (Family Medicine)  This Provider for this visit: Treatment Team:  Attending Provider: Kalman Shan, MD   12/20/2022 -   Chief Complaint  Patient presents with   Follow-up    Pft and ct f/u. Pt denies any concerns      HPI Dustin Dean 73 y.o. -returns for follow-up.  Presents with his wife who is an independent historian.  I personally saw him in May 2024 but since then much has happened.  He got admitted to hospital with pulmonary fibrosis flareup.  Since then he is on 4-5 L continuous.  Today turned his oxygen off he was 87% room air at rest.  Better soon as he moves he says he desaturates.  His lung function has significantly declined.  His FVC is down 16% since February 2024.  He had a CT scan of the chest just before Thanksgiving 2024 and our radiologist thought he is not worse compared to earlier this year but definitely worse compared to a year ago but his history and his lung function show clearly he is declining.  He stopped taking inhaled  treprostinil for his WHO group 3 pulmonary hypertension because of severe cough.  However he is tolerating his pirfenidone well.  He lives with his wife.  There is a "young man" and his wife who are basically like surrogate children.  That is a social support.  He says at this point in time he is on this attending that his life expectancy is diminished.  We had a long conversation about goals of care.  Talked about therapeutic options of chronic prednisone, CellCept and transplant.  He is not interested in any of this.  This because of side effect profile.  He wanted know about his life expectancy.  I did tell him there is a 10 fold increase risk of death in 04-14-2023 because of his significant decline.  I did tell him that I will be very surprised if he is alive 2 years from now and modestly surprised if he was alive a year from now.  We talked about the concept of home palliative care.  We talked about the concept of hospice.  He has done a living will however he has not done a MOST form.  I did explain to him to consider himself no CPR and no intubation but accept BiPAP and simple medical treatment.  Also offered the concept of home palliative care at this point in time and if further decline this can be converted to hospice.  He is processing all this information.  His wife is in tears.  Patient himself states he is realistic.    Discussed medicare hospice benefit  - a medicare paid benefit  - for people with terminal qualifying  illness such as IPF, COPD, cancer with statistical prognosis < 6 months for which there is no cure - utilization of hospice shows people live longer paradoxically than those without hospice due to improved attention  - explained hospice locations - home, residential etc.,   - explained respite care options for caregivers  - explained that she could still get treatment for  non-hospice diagnosis and still come to office to see me for hospice related diagnosis that i provide  support for  - explained that hospice provides nursing, MD, chaplain, volunteer and medications and supplies paid through medicare      He has some urgent continence.  He wants to know if he can take Myrbetriq.  I told him that he could tell his urologist he could.     OV 03/21/2023  Subjective:  Patient ID: Dustin Dean, male , DOB: [birth year] , age 89 y.o. 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  -Progressive phenotype between August 2020 and October 2022   - started ofev mid feb 2023 -> early march 2023 admitted for ILD flare -> rechallenged ofev April 2023 -> ER vist SIRS   - SAE with OFev/ Ofev stopped  - started Pirfenioned May 2023 (rjected prednisone due to side effec tprofile)  - transplaint discussion marrch 2-24 and December 2024- not interested -   -Not interested in transplant, chronic prednisone and CellCept December 2024  9 mm right lower lobe nodule seen in October 2022 for the first time: Stable January 2023 repoted as fibrosis ->Not reported n feb 2024   8 mm lateral subpleural left lower lobe nodule (5/93),-> unchanged from 08/30/2017.-> through feb 2024  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  Respiratory  exacerbations/infections  -Mid November 2023: Sinus congestion and given doxycycline and prednisone  -01/09/2022: COVID-19 and given molnupiravir E  -Summer 2024 admission  RAST allergy panel December 2023 -Mildly positive for cat and dog dander.  Otherwise negative.  Normal IgE'. ELevaed Eos  High risk med  - Esbriet/Pirfenidone requires intensive drug monitoring due to high concerns for Adverse effects of , including  Drug Induced Liver Injury, significant GI side effects that include but not limited to Diarrhea, Nausea, Vomiting,  and other system side effects that include Fatigue, headaches, weight loss and other side effects such as skin rash. These will be monitored with  blood work such as LFT initially once a month for 6 months and then quarterly    HPI Dustin Dean 73 y.o. -returns for follow-up.  Presents with his wife.  He is saying that he is actually doing quite well.  And he is stable.  His energy levels are better.  He is tolerating pirfenidone well.  His oxygen requirements are stable 5 L at rest and 5 L with exertion.  Occasionally if he overexerts he will go down to 87% on 5 L.  He has no major concerns.  We discussed clinical trials as a care option     1. Scientific Purpose  Clinical research is designed to produce generalizable knowledge  and to answer questions about the safety and efficacy of intervention(s) under study in order to determine whether or not they may be useful for the care of future patients.  2. Study Procedures  Participation in a trial may involve procedures or tests, in addition to the intervention(s) under study, that are intended only or primarily to generate scientific knowledge and that are otherwise not necessary for patient care.   3. Uncertainty  For intervention(s) under study in clinical research, there often is less knowledge and more uncertainty about the risks and benefits to a population of trial participants than there is when a  doctor offers a patient standard interventions.   4. Adherence to Protocol  Administration of the intervention(s) under study is typically based on a strict protocol with defined dose, scheduling, and use or avoidance of concurrent medications, compared to administration of standard interventions.  5. Clinician as Investigator  Clinicians who are in Dean care settings provide treatment; in a clinical trial setting, they are also investigating safety and efficacy of an intervention. In otherwise your doctor or nurse practitioner can be wearing 2 hats - one as care giver another as Oceanographer  6. Patient as Engineer, agricultural Subject  Patients participating in research trials are research subjects or volunteers. In other words participating in research is 100% voluntary and at one's own free weill. The decision to participate or not participate will NOT affect patient care and the doctor-patient relationship in any way  7. PHOCUS study by [employer]   - involved [employer] -.  Is a once a day dry powder inhaler.  For patients of fail time also.  Is a 32-month study and then there is a rollover option.  I showed him the website.  We spoke to the study coordinator also spoke to principal investigator Dr. Lorayne Marek.  We have given the consent form to take home and review.  He is interested in this.  He knows that he has to prequalify for it.     SYMPTOM SCALE - ILD 01/24/2021 02/23/2021   04/05/2021 Post hopsilaization. On pred taper and off ofev Ofev 150 twice daily; No prednsone  05/19/2021 214# 11/17/2021  02/26/2022 205# 04/08/2022 199# 05/22/2022 199# - cardica reab 12/20/2022  03/21/2023   Current weight         esbiret esbriet esbiret Esbietr - going to start [employer], sildenafil. OFF tyvaso Esbriet, sildenafil  O2 use 2L [place] with ex 2L with ex 2L [place] ex at hoe 2L         Shortness of Breath 0 -> 5 scale with 5 being worst (score 6 If unable to do)              At  rest 0 0 0 1 0 0 0 0 0 - 0  Simple tasks - showers, clothes change, eating, shaving 1 1 0.5 2 1 1  0 0 0 2 0  Household (dishes, doing bed, laundry) 1 1 1 2 2 1 1 1 1 3 1   Shopping 2 1 0 NA 1 1 1 1 1 3 1   Walking level at own pace 5 2 0 1 (with oxygen) 1 2 2 1  0 3 2  Walking up Stairs 5 3 1  with o2, 6 without o2 2 (with oxygen) 2 3 3 2 2 4 4   Total (30-36) Dyspnea Score 14 8   8 7 8 7 5  with o2 4 with o2 15 8  How bad is your  cough? 2 3 0 with gbapentin 1-2 (on gabapentin) 2 3 2 3 1 4 1   How bad is your fatigue 1 2 0 4 1 4 0 0 0 3  1  How bad is nausea 00000 0 0 0 0 0 0 0 0 0  0  How bad is vomiting?   0 0 0 0 0 0 0 0 0 0  0  How bad is diarrhea? 0 0 0 0 0 0 0 0 0 0  0  How bad is anxiety? 0 0 0 0 0 2 0 1 3 0  0  How bad is depression 0 0 0 0 (on antidepressant) 1 2 0 1 3 0 0  Any chronic pain - if so where and how bad 0 0 0 0 0  1 x x x 0     Simple office walk 185 feet x  3 laps goal with forehead probe 01/24/2021  04/05/2021 On prendisone. Off ofev 05/19/2021 Off ofev 09/08/2021 On esbiret since may 2023 02/26/2022 Esbiret since may 2023 04/06/22 05/22/2022 Full dose Esbriet.  Going to start treprostinil 12/20/2022 87-89% RA at rest  O2 used Ra at rest, desat at 1 lap and then walked with 2L piulse Ra - being on room air for 15 min and in mddile of pred burst taper   ra ra Room air   Number laps completed 1 lap on RA, then 3 laps on 2L Did all 3 laps   1 of 3 laps - forehead probe 3 of 3 laps Sit/stand x 10 times   Comments about pace x avg  avg av     Resting Pulse Ox/HR 96% and 88/min at 1 lap 98% an Hr 92 98% AND HR 81 98% and HR 77 94% 94% and HR 82 98 send and heart rate 81   Final Pulse Ox/HR 92% and 115/min on 3 laps on 2L pulsed 92% and HR 117 91% and HR 115 92% and HR 113 88% 88% and HR 109 93% and heart rate 102   Desaturated </= 88% no no no no      Desaturated <= 3% points yes Yes, 6  Yes 7  Yes 6 pots      Got Tachycardic >/= 90/min yes yes yes avg      Symptoms at end of test  Modreate dyspnea Modate dyspnea Mild dyspnea Mild dyspnea   Level 2 out of 10 dyspnea   Miscellaneous comments Corrected with 2L [place] Did not need o2, improved          PFT     Latest Ref Rng & Units 03/21/2023   11:24 AM 12/18/2022    4:26 PM 08/14/2022   10:53 AM 05/22/2022    9:53 AM 04/04/2022    8:31 AM 02/26/2022    9:56 AM 11/17/2021    1:02 PM  PFT Results  FVC-Pre L 1.86  P 1.69  1.88  2.02  1.98  2.02  1.94   FVC-Predicted Pre % 45  P 41  45  48  51  52  46   FVC-Post L     2.18     FVC-Predicted Post %     57     Pre FEV1/FVC % % 92  P 89  86  90  89  88  88   Post FEV1/FCV % %     93     FEV1-Pre L 1.72  P 1.50  1.62  1.81  1.76  1.78  1.71  FEV1-Predicted Pre % 57  P 49  53  59  62  63  56   FEV1-Post L     2.03     DLCO uncorrected ml/min/mmHg  10.20  15.13  14.22  16.38  12.33  16.63   DLCO UNC% %  41  62  57  71  53  67   DLCO corrected ml/min/mmHg  10.20  15.13  14.38  16.38  12.62  16.87   DLCO COR %Predicted %  41  62  58  71  54  68   DLVA Predicted %  72  109  103  111  98  106   TLC L     3.57     TLC % Predicted %     57     RV % Predicted %     78       P Preliminary result       LAB RESULTS last 96 hours No results found.       has a past medical history of Aortic stenosis, Flu, Pneumonia, Prostate cancer (HCC), Pulmonary fibrosis (HCC), and Skin cancer.   reports that he has never smoked. He has been exposed to tobacco smoke. He has never used smokeless tobacco.  Past Surgical History:  Procedure Laterality Date   HERNIA REPAIR     NASAL SEPTUM SURGERY     SKIN CANCER EXCISION      Allergies  Allergen Reactions   Bee Venom Rash   Shellfish Allergy Hives   Nintedanib Other (See Comments)    SIRS, fevers   Pseudoephedrine Anxiety    Immunization History  Administered Date(s) Administered   Fluad Quad(high Dose 65+) 10/23/2017, 09/30/2019, 10/14/2020, 10/18/2020   Influenza-Unspecified 11/11/2009, 11/08/2011, 11/03/2013, 11/22/2014,  11/10/2015   Moderna Covid-19 Fall Seasonal Vaccine 79yrs & older 10/05/2022   Moderna Covid-19 Vaccine Bivalent Booster 21yrs & up 10/24/2020   Moderna Sars-Covid-2 Vaccination 03/19/2019, 04/16/2019, 11/16/2019, 05/07/2020   Pfizer(Comirnaty)Fall Seasonal Vaccine 12 years and older 10/19/2021   Pneumococcal Conjugate-13 12/08/2007, 12/21/2015, 09/04/2018   Pneumococcal Polysaccharide-23 12/08/2007, 06/21/2017, 06/16/2018   Tdap 12/08/2007, 11/27/2018   Zoster Recombinant(Shingrix) 10/30/2013, 09/04/2018, 11/27/2018   Zoster, Live 10/30/2013   Zoster, Unspecified 10/30/2013    Family History  Problem Relation Age of Onset   Cancer Mother    Congestive Heart Failure Father    Prostate cancer Father    Healthy Sister      Current Outpatient Medications:    acetaminophen (TYLENOL) 325 MG tablet, Take by mouth as needed., Disp: , Rfl:    aspirin-acetaminophen-caffeine (EXCEDRIN MIGRAINE) 250-250-65 MG tablet, Take by mouth as needed., Disp: , Rfl:    Calcium Carb-Cholecalciferol (CALCIUM/VITAMIN D PO), Take 1 tablet by mouth daily., Disp: , Rfl:    Cetirizine HCl (ZYRTEC PO), Take by mouth., Disp: , Rfl:    ESBRIET 801 MG TABS, Take 1 tablet (801 mg total) by mouth 3 (three) times daily with meals., Disp: 270 tablet, Rfl: 2   fluorouracil (EFUDEX) 5 % cream, Apply topically., Disp: , Rfl:    fluticasone (FLONASE) 50 MCG/ACT nasal spray, Place into both nostrils daily., Disp: , Rfl:    gabapentin (NEURONTIN) 300 MG capsule, Take 600 mg by mouth 3 (three) times daily., Disp: , Rfl:    ipratropium (ATROVENT) 0.06 % nasal spray, Place 2 sprays into both nostrils 3 (three) times daily., Disp: , Rfl:    loperamide (IMODIUM) 2 MG capsule, Take 1 mg by mouth once., Disp: ,  Rfl:    meclizine (ANTIVERT) 25 MG tablet, Take 25 mg by mouth 2 (two) times daily as needed., Disp: , Rfl:    Multiple Vitamin (MULTIVITAMIN) capsule, Take 1 capsule by mouth every morning., Disp: , Rfl:    naproxen sodium  (ALEVE) 220 MG tablet, Take 220 mg by mouth daily as needed., Disp: , Rfl:    omeprazole (PRILOSEC) 40 MG capsule, Take 40 mg by mouth daily., Disp: , Rfl:    PAROXETINE HCL PO, Take by mouth., Disp: , Rfl:    predniSONE (DELTASONE) 10 MG tablet, 30mg  x 5 days; 20mg  x 5 days; 10mg  x 5 days; 5mg  x 5 days; then stop, Disp: 35 tablet, Rfl: 0   rosuvastatin (CRESTOR) 5 MG tablet, Take 1 tablet by mouth daily., Disp: , Rfl:    sildenafil (REVATIO) 20 MG tablet, Take by mouth., Disp: , Rfl:    SUMAtriptan (IMITREX) 100 MG tablet, TAKE ONE TABLET BY MOUTH AT ONSET OF HEADACHE; MAY REPEAT ONE TABLET IN 2 HOURS IF NEEDED. MAX OF 200 MG PER DAY, Disp: , Rfl:       Objective:   Vitals:   03/21/23 1406  BP: 123/70  Pulse: 85  Temp: 98.1 F (36.7 C)  TempSrc: Oral  SpO2: 95%  Weight: 203 lb (92.1 kg)  Height: 5\' 8"  (1.727 m)    Estimated body mass index is 30.87 kg/m as calculated from the following:   Height as of this encounter: 5\' 8"  (1.727 m).   Weight as of this encounter: 203 lb (92.1 kg).  @WEIGHTCHANGE @  [employer]   03/21/23 1406  Weight: 203 lb (92.1 kg)     Physical Exam   General: No distress. Looks well  O2 at rest: YES Cane present: no Sitting in wheel chair: no Frail: no Obese: no Neuro: Alert and Oriented x 3. GCS 15. Speech normal Psych: Pleasant Resp:  Barrel Chest - no.  Wheeze - no, Crackles - YES BASE, No overt respiratory distress CVS: Normal heart sounds. Murmurs - no Ext: Stigmata of Connective Tissue Disease - no HEENT: Normal upper airway. PEERL +. No post nasal drip        Assessment:       ICD-10-CM   1. ILD (interstitial lung disease) (HCC)  J84.9 Hepatic function panel    Pulmonary function test    2. Pulmonary hypertension (HCC)  I27.20 Hepatic function panel    Pulmonary function test    3. Encounter for medication counseling  Z71.89 Hepatic function panel    Pulmonary function test    4. Chronic respiratory failure with  hypoxia (HCC)  J96.11 Hepatic function panel    Pulmonary function test         Plan:     Patient Instructions  LD (interstitial lung disease) (HCC) -high suspect for chronic hypersensitive pneumonitis Pulmonary air trapping Chronic respiratory failure with hypoxia (HCC) Rheumatoid factor positive Encounter for therapeutic drug monitoring  - progressive diseaese body of symptoms stabilized between July 2024 and March 2025  - needing 5L [place] at all times - noted tyvaso intolerance -Tolerating aspirate quite well.  Plan (shared decision making)  --Continue pirfenidone 801mg  x 3 times daily  - take with food  - space 5-6 hours apart  - apply sunscreen  - wear hat  - hydrate well   - do LFT  03/21/2023  - sidelanfil might not be beneifical for your type of pulmonary hypertension but ok to continue for now  - discussed transplant,  chronic prednisone and cellcept  - respect refusal  - take consent form for PHOCUS study with [employer] v Placebo  - meet Diwanshu of [facility]     Follow-up  - 12-14  weeks 30-minute visit with Dr. Marchelle Dean but after spirometry and DLCO.  = can cancel If on reearch   FOLLOWUP Return in about 13 weeks (around 06/20/2023) for 30 min visit, after Cleda Daub DLCO, Chronic Respiratory Failure, with Dr Dustin Dean, Face to Face Visit.   ( Level 05 visit E&M 2024: Estb >= 40 min  in  visit type: on-site physical face to visit  in total care time and counseling or/and coordination of care by this undersigned MD - Dr Dustin Dean. This includes one or more of the following on this same day 03/21/2023: pre-charting, chart review, note writing, documentation discussion of test results, diagnostic or treatment recommendations, prognosis, risks and benefits of management options, instructions, education, compliance or risk-factor reduction. It excludes time spent by the CMA or office staff in the care of the patient. Actual time 40 min)    SIGNATURE    Dr.  Kalman Dean, M.D., F.C.C.P,  Pulmonary and Critical Care Medicine Staff Physician, [facility] Director - Interstitial Lung Disease  Program  P[facility] at [facility] [place], [place], [address]  Pager: [phone], If no answer or between  15:00h - 7:00h: call [phone] Telephone: [phone]  2:51 PM 03/21/2023

## 2023-03-21 NOTE — Patient Instructions (Addendum)
 LD (interstitial lung disease) (HCC) -high suspect for chronic hypersensitive pneumonitis Pulmonary air trapping Chronic respiratory failure with hypoxia (HCC) Rheumatoid factor positive Encounter for therapeutic drug monitoring  - progressive diseaese body of symptoms stabilized between July 2024 and March 2025  - needing 5L [place] at all times - noted tyvaso intolerance -Tolerating aspirate quite well.  Plan (shared decision making)  --Continue pirfenidone 801mg  x 3 times daily  - take with food  - space 5-6 hours apart  - apply sunscreen  - wear hat  - hydrate well   - do LFT  03/21/2023  - sidelanfil might not be beneifical for your type of pulmonary hypertension but ok to continue for now  - discussed transplant, chronic prednisone and cellcept  - respect refusal  - take consent form for PHOCUS study with [employer]AT v Placebo  - meet Diwanshu of [facility]     Follow-up  - 12-14  weeks 30-minute visit with Dr. Marchelle Gearing but after spirometry and DLCO.  = can cancel If on reearch

## 2023-03-21 NOTE — Patient Instructions (Signed)
 Spirometry Performed Today.

## 2023-03-21 NOTE — Progress Notes (Signed)
 Spirometry Performed Today.

## 2023-03-25 ENCOUNTER — Encounter: Payer: Self-pay | Admitting: Behavioral Health

## 2023-03-25 ENCOUNTER — Ambulatory Visit: Payer: Medicare Other | Admitting: Behavioral Health

## 2023-03-25 DIAGNOSIS — F411 Generalized anxiety disorder: Secondary | ICD-10-CM

## 2023-03-25 DIAGNOSIS — F331 Major depressive disorder, recurrent, moderate: Secondary | ICD-10-CM | POA: Diagnosis not present

## 2023-03-25 NOTE — Progress Notes (Signed)
 [facility] Counselor Initial Adult Exam  Name: Dustin Dean Date: 03/25/2023 MRN: [id number] DOB: 12-22-1950 PCP: [name], MD  Time spent: 55 minutes, 10 AM until 10:55 AM spent in person with the patient in the outpatient therapist office. The patient reports that physically not much has changed.  He meets with Dr. Colletta Maryland in a couple of weeks and hopes to have some clear answers as to what the doctor thinks might be taking place.  He has been doing some research in a healthy way to see if others who have gone through this supposed to do anything about timelines and expectations.  He already knows the physical signs to look for but wants to know if there is a more gradual or more rapid transition.  He feels that medication helps reduce his anxiety.  He does appear to be processing and handling this well.  He is going through the business to episodic in terms of financial and life care etc.  He and his wife talk about it honestly and openly.  He has not spoken to his adopted son Dustin Dean at length yet but his wife Dustin Dean has their healthcare power of attorney after his wife and they plan to have a detailed conversation with her.  He also needs to change as well as his nephew was in as well and his nephew passed away several months ago.   The patient recently said something about 4 to 5 years and now that he would not be around in her response was "yes you will.  Realizes that is not my probability but did not say anything to her but says he does not like when he feels like people are placating him.  He knows it probably comes from her not being comfortable with that subject.  We talked about how to best handle those that make it easier for the patient.  He appears to be handling his anxiety and depression very well and feels that medication therapy things he is doing at home are keeping that manageable.  He does contract for safety having no thoughts of hurting himself or anyone  else.   Guardian/Payee: Self  Paperwork requested: Yes   Reason for Visit /Presenting Problem: Depression  . He does contract for safety having no thoughts of hurting himself or anyone else. Mental Status Exam: Appearance:   Fairly Groomed     Behavior:  Appropriate  Motor:  Normal  Speech/Language:   Clear and Coherent and Normal Rate  Affect:  Appropriate  Mood:  normal  Thought process:  normal  Thought content:    WNL  Sensory/Perceptual disturbances:    WNL  Orientation:  oriented to person, place, time/date, situation, and day of week  Attention:  Good  Concentration:  Good  Memory:  WNL  Fund of knowledge:   Good  Insight:    Good  Judgment:   Good  Impulse Control:  Good     Reported Symptoms: Depression  Risk Assessment: Danger to Self:  No Self-injurious Behavior: No Danger to Others: No Duty to Warn:no Physical Aggression / Violence:No  Access to Firearms a concern: No  Gang Involvement:No  Patient / guardian was educated about steps to take if suicide or homicide risk level increases between visits: n/a While future psychiatric events cannot be accurately predicted, the patient does not currently require acute inpatient psychiatric care and does not currently meet [facility] involuntary commitment criteria.  Substance Abuse History: Current substance abuse: No  Past Psychiatric History:   Previous psychological history is significant for depression Outpatient Providers: Primary care physician History of Psych Hospitalization:  None reported Psychological Testing:  n/a    Abuse History:  Victim of: No.,  None reported    Report needed: No. Victim of Neglect:No. Perpetrator of n/a Witness / Exposure to Domestic Violence: None reported  Protective Services Involvement: No  Witness to [employer] Violence:  No   Family History:  Family History  Problem Relation Age of Onset   Cancer Mother    Congestive Heart Failure Father    Prostate  cancer Father    Healthy Sister     Living situation: the patient lives with their spouse  Sexual Orientation: Straight  Relationship Status: married  Name of spouse / other: Did not discuss If a parent, number of children / ages: Adults  Support Systems: spouse  Financial Stress:  No   Income/Employment/Disability: Doctor, general practice: No   Educational History: Education: high school diploma/GED  Religion/Sprituality/World View: Did not discuss  Any cultural differences that may affect / interfere with treatment:  not applicable   Recreation/Hobbies: Time with family  Stressors: Health problems    Strengths: Supportive Relationships, Hopefulness, Self Advocate, and Able to Communicate Effectively  Barriers:     Legal History: Pending legal issue / charges: The patient has no significant history of legal issues. History of legal issue / charges:  Not applicable  Medical History/Surgical History: reviewed Past Medical History:  Diagnosis Date   Aortic stenosis    Flu    Pneumonia    Prostate cancer (HCC)    Pulmonary fibrosis (HCC)    Skin cancer     Past Surgical History:  Procedure Laterality Date   HERNIA REPAIR     NASAL SEPTUM SURGERY     SKIN CANCER EXCISION      Medications: Current Outpatient Medications  Medication Sig Dispense Refill   acetaminophen (TYLENOL) 325 MG tablet Take by mouth as needed.     aspirin-acetaminophen-caffeine (EXCEDRIN MIGRAINE) 250-250-65 MG tablet Take by mouth as needed.     Calcium Carb-Cholecalciferol (CALCIUM/VITAMIN D PO) Take 1 tablet by mouth daily.     Cetirizine HCl (ZYRTEC PO) Take by mouth.     ESBRIET 801 MG TABS Take 1 tablet (801 mg total) by mouth 3 (three) times daily with meals. 270 tablet 2   fluorouracil (EFUDEX) 5 % cream Apply topically.     fluticasone (FLONASE) 50 MCG/ACT nasal spray Place into both nostrils daily.     gabapentin (NEURONTIN) 300 MG capsule Take 600 mg  by mouth 3 (three) times daily.     ipratropium (ATROVENT) 0.06 % nasal spray Place 2 sprays into both nostrils 3 (three) times daily.     loperamide (IMODIUM) 2 MG capsule Take 1 mg by mouth once.     meclizine (ANTIVERT) 25 MG tablet Take 25 mg by mouth 2 (two) times daily as needed.     Multiple Vitamin (MULTIVITAMIN) capsule Take 1 capsule by mouth every morning.     naproxen sodium (ALEVE) 220 MG tablet Take 220 mg by mouth daily as needed.     omeprazole (PRILOSEC) 40 MG capsule Take 40 mg by mouth daily.     PAROXETINE HCL PO Take by mouth.     predniSONE (DELTASONE) 10 MG tablet 30mg  x 5 days; 20mg  x 5 days; 10mg  x 5 days; 5mg  x 5 days; then stop 35 tablet 0   rosuvastatin (CRESTOR) 5  MG tablet Take 1 tablet by mouth daily.     sildenafil (REVATIO) 20 MG tablet Take by mouth.     SUMAtriptan (IMITREX) 100 MG tablet TAKE ONE TABLET BY MOUTH AT ONSET OF HEADACHE; MAY REPEAT ONE TABLET IN 2 HOURS IF NEEDED. MAX OF 200 MG PER DAY     No current facility-administered medications for this visit.    Allergies  Allergen Reactions   Bee Venom Rash   Shellfish Allergy Hives   Nintedanib Other (See Comments)    SIRS, fevers   Pseudoephedrine Anxiety    Diagnoses:  Major depressive disorder, recurrent, moderate, irritability Plan of Care: I will meet with the patient every 2 to 3 weeks in person Treatment plan: We will use cognitive behavioral therapy, even though some therapy as well as elements of dialectical behavior therapy to help the patient reduce his anxiety by at least 50% with a target date of  October 30th 2024.  Goals will be to help the patient better manage anxiety symptoms and stress, identify causes for anxiety including changes in his health and ways to lower the anxiety, resolve the conflicts contributing to anxiety and help him manage thoughts and worrisome thinking contributing to his anxiety.  Interventions include providing education about anxiety to help him understand  its causes symptoms and triggers..  We will facilitate problem solution skills as well as coping skills to help him manage anxiety and stress symptoms.  We will use cognitive behavioral therapy to identify and change anxiety provoking thought and behavior patterns as well as dialectical therapy to teach distress tolerance and mindfulness skills. Progress: 35% We will continue with treatment goals as listed above with a new target date of May 15, 2023                                  [place], [facility]                  French Ana, [place]               French Ana, [facility]               French Ana, [facility]               French Ana, [facility]               French Ana, [facility]               French Ana, [facility]               French Ana, [facility]               French Ana, [facility]               French Ana, [facility]               French Ana, [facility]               French Ana, [facility]               French Ana, [facility]               French Ana, [facility] Counselor Initial Adult Exam  Name: Dustin Dean Date: 03/25/2023 MRN: [id number] DOB: 18-Aug-1950 PCP: [name], MD  Time spent: 57 minutes, 11 AM until 11:57  AM spent in person with the patient in the outpatient therapist office. The patient reports that physically not much has changed.  He is going to apply to be a part of a study for any medication they are testing for both of the pulmonary conditions that he has been diagnosed with.  He does not know if he will be accepted or not but is willing to be a part of it as there will be a certain percentage to take a new trial of medication as well as those who have a placebo medication.  At the end of the 6 months everyone will receive  the medication.  He wants to learn from that and hopes that they can learn something from that so that others and hopefully him could benefit from a new medication.  Otherwise things are going fairly well.  He feels that he is dealing with his condition and diagnosis in a healthy way.  He and his wife openly communicate about it.  He continues to make plans that he feels he needs to and is practicing good care of both physically mentally and emotionally. He does contract for safety having no thoughts of hurting himself or anyone else.   Guardian/Payee: Self  Paperwork requested: Yes   Reason for Visit /Presenting Problem: Depression  . He does contract for safety having no thoughts of hurting himself or anyone else. Mental Status Exam: Appearance:   Fairly Groomed     Behavior:  Appropriate  Motor:  Normal  Speech/Language:   Clear and Coherent and Normal Rate  Affect:  Appropriate  Mood:  normal  Thought process:  normal  Thought content:    WNL  Sensory/Perceptual disturbances:    WNL  Orientation:  oriented to person, place, time/date, situation, and day of week  Attention:  Good  Concentration:  Good  Memory:  WNL  Fund of knowledge:   Good  Insight:    Good  Judgment:   Good  Impulse Control:  Good     Reported Symptoms: Depression  Risk Assessment: Danger to Self:  No Self-injurious Behavior: No Danger to Others: No Duty to Warn:no Physical Aggression / Violence:No  Access to Firearms a concern: No  Gang Involvement:No  Patient / guardian was educated about steps to take if suicide or homicide risk level increases between visits: n/a While future psychiatric events cannot be accurately predicted, the patient does not currently require acute inpatient psychiatric care and does not currently meet [facility] involuntary commitment criteria.  Substance Abuse History: Current substance abuse: No     Past Psychiatric History:   Previous psychological history is  significant for depression Outpatient Providers: Primary care physician History of Psych Hospitalization:  None reported Psychological Testing:  n/a    Abuse History:  Victim of: No.,  None reported    Report needed: No. Victim of Neglect:No. 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 History of legal issue / charges:  Not applicable  Medical History/Surgical History: reviewed Past Medical History:  Diagnosis Date   Aortic stenosis    Flu    Pneumonia    Prostate cancer (HCC)    Pulmonary fibrosis (HCC)    Skin cancer     Past Surgical History:  Procedure Laterality Date   HERNIA REPAIR     NASAL SEPTUM SURGERY     SKIN CANCER EXCISION      Medications: Current Outpatient Medications  Medication Sig Dispense Refill   acetaminophen (TYLENOL) 325 MG tablet Take by mouth as  needed.     aspirin-acetaminophen-caffeine (EXCEDRIN MIGRAINE) 250-250-65 MG tablet Take by mouth as needed.     Calcium Carb-Cholecalciferol (CALCIUM/VITAMIN D PO) Take 1 tablet by mouth daily.     Cetirizine HCl (ZYRTEC PO) Take by mouth.     ESBRIET 801 MG TABS Take 1 tablet (801 mg total) by mouth 3 (three) times daily with meals. 270 tablet 2   fluorouracil (EFUDEX) 5 % cream Apply topically.     fluticasone (FLONASE) 50 MCG/ACT nasal spray Place into both nostrils daily.     gabapentin (NEURONTIN) 300 MG capsule Take 600 mg by mouth 3 (three) times daily.     ipratropium (ATROVENT) 0.06 % nasal spray Place 2 sprays into both nostrils 3 (three) times daily.     loperamide (IMODIUM) 2 MG capsule Take 1 mg by mouth once.     meclizine (ANTIVERT) 25 MG tablet Take 25 mg by mouth 2 (two) times daily as needed.     Multiple Vitamin (MULTIVITAMIN) capsule Take 1 capsule by mouth every morning.     naproxen sodium (ALEVE) 220 MG tablet Take 220 mg by mouth daily as needed.     omeprazole (PRILOSEC) 40 MG capsule Take 40 mg by mouth daily.     PAROXETINE HCL PO Take by mouth.     predniSONE (DELTASONE) 10 MG tablet 30mg  x 5 days; 20mg  x 5 days; 10mg  x 5 days; 5mg  x 5 days; then stop 35 tablet 0   rosuvastatin (CRESTOR) 5 MG tablet Take 1 tablet by mouth daily.     sildenafil (REVATIO) 20 MG tablet Take by mouth.     SUMAtriptan (IMITREX) 100 MG tablet TAKE ONE TABLET BY MOUTH AT ONSET OF HEADACHE; MAY REPEAT ONE TABLET IN 2 HOURS IF NEEDED. MAX OF 200 MG PER DAY     No current facility-administered medications for this visit.    Allergies  Allergen Reactions   Bee Venom Rash   Shellfish Allergy Hives   Nintedanib Other (See Comments)    SIRS, fevers   Pseudoephedrine Anxiety    Diagnoses:  Major depressive disorder, recurrent, moderate, irritability Plan of Care: I will meet with the patient every 2 to 3 weeks in person Treatment plan: We will use cognitive behavioral therapy, even  though some therapy as well as elements of dialectical behavior therapy to help the patient reduce his anxiety by at least 50% with a target  date of  October 30th 2024.  Goals will be to help the patient better manage anxiety symptoms and stress, identify causes for anxiety including changes in his health and ways to lower the anxiety, resolve the conflicts contributing to anxiety and help him manage thoughts and worrisome thinking contributing to his anxiety.  Interventions include providing education about anxiety to help him understand its causes symptoms and triggers..  We will facilitate problem solution skills as well as coping skills to help him manage anxiety and stress symptoms.  We will use cognitive behavioral therapy to identify and change anxiety provoking thought and behavior patterns as well as dialectical therapy to teach distress tolerance and mindfulness skills. Progress: 35% We will continue with treatment goals as listed above with a new target date of May 15, 2023                                  [place], [facility]                  [place] Ana, [facility]               French Ana, [facility]               French Ana, [facility]               French Ana, [facility]               French Ana, [facility]               French Ana, [facility]               French Ana, [facility]               French Ana, [facility]               [place] Ana, [facility]               [place] Ana, [facility]               French Ana, [facility]               French Ana, [facility]               [place] Ana, [facility]               [place], [facility]

## 2023-03-26 ENCOUNTER — Encounter: Payer: Self-pay | Admitting: Internal Medicine

## 2023-04-09 ENCOUNTER — Ambulatory Visit: Payer: Medicare Other | Admitting: Behavioral Health

## 2023-04-11 ENCOUNTER — Other Ambulatory Visit: Payer: Self-pay | Admitting: Pulmonary Disease

## 2023-04-12 ENCOUNTER — Other Ambulatory Visit: Payer: Self-pay | Admitting: Pulmonary Disease

## 2023-04-15 ENCOUNTER — Encounter: Admitting: Pulmonary Disease

## 2023-04-15 ENCOUNTER — Other Ambulatory Visit: Payer: Self-pay | Admitting: Pulmonary Disease

## 2023-04-15 ENCOUNTER — Ambulatory Visit

## 2023-04-15 DIAGNOSIS — Z006 Encounter for examination for normal comparison and control in clinical research program: Secondary | ICD-10-CM

## 2023-04-15 DIAGNOSIS — I2723 Pulmonary hypertension due to lung diseases and hypoxia: Secondary | ICD-10-CM

## 2023-04-15 DIAGNOSIS — J849 Interstitial pulmonary disease, unspecified: Secondary | ICD-10-CM

## 2023-04-15 NOTE — Research (Addendum)
 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 Uptitration will proceed weekly up to the maximum daily dose of 4 mg or matched placebo (Figure 2). For example, Day 1 starting dose will be 1 mg, Day 8 2 mg and Day 15 4 mg, or respective matched placebo, as tolerated. The titration period will not exceed the first 6 weeks from randomization; this period is to allow reasonable flexibility to up- or down-titrate. Titration should occur in the best judgement of the Investigator utilizing data for each individual participant. After the 6-week titration period, dose modifications can occur based on safety and tolerability (Section 6.2.2). Part 1 Placebo-controlled Treatment period: Approximately 30 weeks for an individual participant (up to 6-week Screening period, 24-week Placebo-controlled Treatment period with a primary endpoint assessed at Week 16). Part 2 Extension period: Up to approximately 200 weeks for an individual participant. After Week 24, all participants may receive mosliciguat through an Extension period until either the participant discontinues or the study ends.  Protocol # Sponsor:[employer]  Protocol Version/Amendment:  Version 1.0, dated 25Apr2024 IB: Version 8.0 dated 19Apr2024 ICF:  Main ICF: [facility] [facility] Approved Version 3Jul2024, Revised 5Sep2024 Lab Manual: Version2.0 dated 02Oct204   Key Inclusion Criteria: 1. Written informed consent, according to local guidelines, signed and dated by the  participant prior to the performance of any study-specific procedures, sampling, or analysis. 2. Adult participants between 1 and 43 years of age (or minimum age of adult consent by  local guidelines if >= 18 years). 3. Participants with diagnosis of ILD. Diagnosis will be confirmed by central read of a high-resolution computerized tomography (HR-CT) scan showing diffuse parenchymal disease. An HR-CT scan obtained within 1 year prior to randomization is acceptable. Eligible diagnosed diseases include: a. Idiopathic interstitial pneumonia (IIP) [idiopathic pulmonary fibrosis, idiopathic nonspecific interstitial pneumonia, respiratory bronchiolitis-associated interstitial lung disease, desquamative interstitial pneumonia, cryptogenic organizing pneumonia, acute interstitial pneumonia, idiopathic lymphoid interstitial pneumonia, idiopathic pleuroparenchymal fibroelastosis, unclassified IIP]. b. Chronic hypersensitivity pneumonitis. c. ILD associated with connective tissue disease (CTD) with a forced vital capacity (FVC) &lt; 70% of predicted. 4. The following criteria must be met on the Screening HR-CT scan. An HR-CT scan obtained within 1 year prior to randomization is acceptable: a. The fibrotic component must be 10% or greater. b. The emphysema component cannot exceed 15%. 5. Confirmed PH and hemodynamics as defined below obtained during Screening RHC performed according to the study-specific RHC manual as confirmed by central read. RHC can be completed at Screening or within 3 months prior to randomization (historic RHC must have wave forms available for review): a. PVR >= 4 WU. b. Pulmonary capillary wedge pressure (PCWP) <= 15 mmHg. c. mPAP &gt; 20 mmHg. Note: RHC for eligibility during Screening is required for participants who started or changed PH directed therapy in the interim since the last RHC and for participants on a stable dose of a PDE5 inhibitor. 6. Participants with PH-ILD who meet one of the following criteria: a. Nave to PH-directed therapy. b. Previously treated with PH-directed therapy and now off therapy with adequate washout defined as at least  5 half-lives or 28 days prior to the RHC procedure, whichever is longer. c. On an oral PDE5 inhibitor for the treatment of PH if the PDE5 inhibitor was started at least  3 months prior to randomization and achieved a stable dose at least 42 days (6 weeks) prior to RHC.  Note: PDE5 inhibitor for the treatment of PH is permissible up to approximately 25% of the study population. Intermittent use of PDE5 inhibitors for erectile dysfunction is allowed. 7. Able to perform with a distance of >= 100 meters. Please refer to American Thoracic Society Statement: Guidelines for the Six-Minute Walk Test (ATS 2002 [and erratum 2016]) on absolute and relative contraindications: a. Absolute contraindications include unstable angina or myocardial infarction within a month of the planned . b. Relative contraindications include a resting heart rate of &gt; 120 bpm, a systolic blood pressure >= 180 mmHg and a diastolic blood pressure of >= 161 mmHg. 8. Systolic blood pressure at rest >= 95 mmHg (average of 3 readings). 9. Heart rate at rest <= 100 bpm (average of 3 readings). 10. Participants on chronic medication for underlying lung disease must be on a stable optimized dose for >= 28 days prior to randomization and on study. 11. Must have adequate hematologic laboratory assessments, as follows: a. Absolute neutrophil count (ANC) >= 1.2 x 109/L. b. Hemoglobin >= 9 g/dL OR hematocrit >= 09%. c. Platelets >= 100 x 109/L. If any parameter is below the above values, these laboratory test(s) may be repeated once during Screening, at the discretion of the Investigator. 12. Must have an adequate liver function, as follows: a. Alanine aminotransferase (ALT) and/or aspartate aminotransferase (AST) levels <= 2.0  upper limit of normal (ULN). b. Total bilirubin (Tbil) &lt; 2.0  ULN. If any value is above the ULN, these laboratory test(s) may be repeated once during Screening, at the discretion of the  Investigator. 13. Must have an adequate renal function and not be on renal replacement therapy with an estimated glomerular filtration rate (eGFR) (by Modification of Diet in Renal Disease [MDRD]) of >= 30 mL/min/1.84m2. 14. Participants must adhere to the following related to pregnancy, contraception, and lactation: a. Females of childbearing potential (FOCBP) must agree to use a highly effective method of contraception (consistent with the Clinical Trials Facilitation Group guideline from 2020) during the study and for 7 months after the last dose of the study drug. The selected highly effective method of contraception must be combined with at least one of the following barrier methods of contraception: a condom (male or male), a diaphragm, or a cervical cap.  b. Lactating but willing to stop feeding her milk to any child, either by actual breastfeeding or by pumping breast milk for bottle-feeding, freezing, or donation, prior to the first dose of study treatment until at least 30 days after the last dose of the study drug. c. Agree not to undergo oocyte retrieval for the purpose of donation, preservation for future personal use, or in vitro fertilization (IVF) starting from signing of informed consent and not to begin any hormonal preparation for oocyte retrieval or IVF for at least 7 months after the last dose of the study drug. d. Females in the following categories are not considered to be FOCBP: i. Documented bilateral salpingectomy, bilateral oophorectomy, or hysterectomy. ii. Post-menopausal, defined as no further menses for 12 months without an alternative medical cause. iii. High follicle stimulating hormone (FSH) levels in the post-menopausal range may be used to confirm a post-menopausal state in females aged <= 60 years not using hormonal contraception or hormone replacement therapy (HRT). However, in the absence of 12 months of amenorrhea, confirmation with two FSH  measurements in the post-menopausal range is  required. iv. Females receiving HRT and whose menopausal status is in doubt will be required to use one of the nonhormonal, highly effective contraception methods if they wish to continue their HRT during the study. Otherwise, they must discontinue HRT to allow confirmation of post- menopausal status before study enrollment. e. Are biologically male at birth and agree to either complete abstinence from heterosexual intercourse or the proper and consistent use (including during the entire pregnancy of any pregnant partners) of latex or synthetic (ie, not lambskin) condoms starting from signing of informed consent and for at least 120 days after the last dose of the study drug. Male participants must inform all male partners of the need for additional male contraception, preferably a highly effective form. f. Are biologically male at birth and agree not to provide sperm for donation, preservation for future personal use, or IVF starting from signing of informed consent and for at least 120 days after the last dose of the study drug. g. Participants of any gender who practice total abstinence or who only have same-sex sexual relationships do not need to use contraception. Key Exclusion Criteria:1. Diagnosis of PH Group 1 (eg, pulmonary arterial hypertension), Group 2 (related to left-heart dysfunction), Group 4 (eg, chronic thromboembolic pulmonary hypertension), or Group 5 (eg, unclassified).  2. Diagnosis of PH Group 3 but not listed in inclusion criterion #3. 3. Untreated concurrent obstructive sleep apnea, or if treated with positive airway pressure therapy but considered non-compliant (eg, positive airway pressure or device usage &lt; 70% of nights) and/or has not achieved clinical effectiveness (eg, Apnea- Hypopnea Index <= 10) 4. Currently on therapy for PH, including but not limited to: prostacyclins (eg, treprostinil),  prostacyclin receptor agonists (eg, selexipag), endothelin receptor antagonists (eg, ambrisentan). 5. History of intolerance to or lack of efficacy with mosliciguat or sGC stimulators or activators. 6. Severe allergy to milk protein or any lactose-containing pharmaceutical. Participants with metabolic lactose intolerance may be enrolled at [employer] discretion. 7. Evidence of clinically significant left-sided heart disease: PCWP &gt; 15 mmHg or left ventricular ejection fraction &lt; 40% as assessed by ECHO, multigated angiogram (MUGA) or ventriculography. Note: impaired left ventricular (LV) filling due to right ventricular overload will be allowed after review with Sponsor. 8. Any serious chronic, and/or unstable pre-existing medical, surgical, psychiatric or other condition that could interfere with the participant's safety, obtaining informed consent, or compliance with study procedures. This includes symptomatic cirrhosis or chronic liver disease, left-heart failure, untreated clinically significant arrhythmias, bleeding disorders, untreated symptomatic coronary artery disease resulting in unstable angina, and substance use disorder. 9. Clinically significant abnormal ECG or prolonged QT interval rate-corrected using Fridericia's method (QTcF) (QTcF &gt; 450 msec in men, &gt; 470 msec in women or &gt; 500 msec if right bundle branch block is present) at Screening. 10. Known human immunodeficiency virus (HIV) infection, or active viral hepatitis infection      Pharmacodynamics: The selective and long-lasting effects of mosliciguat on PAP was confirmed after intratracheal inhaled application of mosliciguat as dry powder lactose formulations in the minipig model of acute PH. In a chronic Sugen/hypoxia-induced PAH rat model after the treatment with 50 or 100 mg/kg oral sildenafil or aerosolized mosliciguat (50 mg/m3) as well as both substances in combination, the relative right  ventricular weight as well as the systolic right ventricular pressure were  decreased when compared to sham control. Furthermore, the incidence and severity of the histopathological findings (thickened vascular walls, perivascular inflammatory infiltrates as well as vascular plexiform lesions, typical of PAH) were  reduced. With regard to hemodynamic effects as well as histopathological findings, the combined treatment with oral sildenafil and aerosolized mosliciguat had a stronger effect. With respect to ventilation, mosliciguat (1, 10, and 100 ?g/kg) showed a strong and clear dose-dependent inhibitory effect on the ACh-induced bronchospasm in rats. In a prophylactic therapy concept using a mouse model of allergic asthma, mosliciguat showed inhibitory effects on AHR and inflammation at a lung-deposited dose of 100 ?g/kg. In an ex vivo study using rat myocardial strips, mosliciguat (1 nM to 10 ?M) did not have an effect on cardiomyocyte contractility or spontaneous beating frequency. In summary, the novel and potent sGC activator, mosliciguat, exhibits the typical in vitro and in vivo profile of a selective sGC activator. Mosliciguat selectively decreased elevated PAP after inhaled application in different disease relevant animal models with a long duration of action. On top of SoC in [facility] models, mosliciguat maintained hemodynamic selectivity, decreasing elevated PAP without impacting BP, following inhaled application.  Pharmacokinetics: (ADME) - The single-dose PK of inhaled mosliciguat over the range of 0.24 to 4 mg has been evaluated in participants with untreated PAH or CTEPH. Plasma concentration in participants in [place] were in the lower range of the plasma concentrations measured in healthy participants in Study 17288 (historical comparison) at corresponding doses. Across all dose groups, PK showed high variability. Geometric mean plasma concentrations of mosliciguat in  participants were increased with ascending inhaled doses. Special Warnings/Considerations: 6.4.2.1. General Clinical and Laboratory Monitoring Monitoring of vital signs, hemodynamics, lung function, standard hematological parameters, and  standard laboratory parameters of renal and liver function are recommended. 6.4.2.2. Pregnancy and Lactation The potential effects of mosliciguat on human pregnancy and fetal development are unknown. Please refer to the protocol for specific contraception requirements based on participant sex and reproductive potential. The extent to which mosliciguat might be present in seminal fluid is unknown. If a male partner of a male mosliciguat study participant becomes pregnant, latex or synthetic condoms should be used throughout the pregnancy to avoid exposing the developing fetus. The extent to which mosliciguat might be present in breast milk and the potential risks to breastfeeding infants are unknown. Women taking mosliciguat should not breastfeed or donate breast milk. 6.4.2.3. Overdose No cases of accidental or deliberate overdosage with mosliciguat are known to have occurred. Because of the pharmacological profile of mosliciguat, cardiovascular effects as described in Section 5.5 might be expected with overdose. There is no known antidote to mosliciguat. In the event of an overdose, mosliciguat administration should be suspended, and supportive care should be given as clinically indicated in a facility equipped to provide any type of treatment that might become necessary. General measures to minimize absorption (eg, gastric lavage, activated charcoal) may be considered at the discretion of the physician treating an oral overdose of mosliciguat. The affected study participant should remain under medical supervision until all relevant AEs have subsided. The investigator will decide if/when mosliciguat should be restarted and whether the overdose  experience (eg, the specific AEs, the risk of overdosing again) justifies discontinuing the participant from the study. 6.4.2.4. Lactose Intolerance While metabolic lactose intolerance is not a contraindication to mosliciguat inhalation, it is possible that persons with severe lactose intolerance could experience gastrointestinal effects from swallowing lactose carriers deposited in the mouth and pharynx. Clinical discretion should be used.            Serious Adverse Reactions Observed Postmarketing: Safety Data: In total, 31 out of 77 participants (40.3%) who received inhaled study treatment (26 mosliciguat,  5 placebo) had at least 1 treatment-emergent adverse event (TEAE). Of these, 12 (15.6%) participants experienced TEAEs assessed as related to the study drug and 13 (16.9%) participants experienced TEAEs assessed as related to procedures required by the protocol. No serious or fatal TEAEs were reported in this study, and no participant prematurely discontinued the study treatment with mosliciguat due to an AE or serious adverse event (SAE). The reported TEAEs were mild (25 participants, 32.5%) to moderate (6 participants, 7.8%) in maximum intensity. The TEAEs classified with moderate intensity were gastritis (1 participant at 0.06 mg), nasopharyngitis (1 participant at 0.24 mg), syncope (1 participant at 2 mg), dizziness (1 participant at 4 mg), and orthostatic hypotension (2 participants at 3 mg and 1 participant at 4 mg). The most frequently occurring classes of TEAEs were nervous system disorders (8 participants, 10.4%) eg, dizziness and headache, and investigations (7 participants, 9.1%) eg, AST, blood creatine phosphokinase, and lipase increases   [facility] @  Clinical Research Coordinator note:   This visit for Subject Dustin Dean with DOB: 10/21/1950 on 04/15/2023 for the above protocol is Visit/Encounter # Screening visit part 1  and is for purpose of research.    The consent for this encounter is under Protocol Version is Version 1.0, dated 25Apr2024 IB: Version 8.0 dated 19Apr2024 ICF:  Main ICF: [facility] IR[facility] Approved Version 3Jul2024, Revised 5Sep2024 Lab Manual: Version2.0 dated 02Oct204 currently IRB approved.   Dustin Dean  [facility]) expressed continued interest and consent in continuing as a study subject. Subject confirmed that there was  No  (NO/YES) change in contact information (e.g. address, telephone, email). Subject thanked for participation in research and contribution to science. In this visit 04/15/2023 the subject will be evaluated by Dr Judeth Horn (Principal Investigator). This research coordinator has verified that the above investigator is Yes YES/NOT up to date with his/her training logs.   The Subject was Yes (YES) informed that the PI Dr Judeth Horn continues to have oversight of the subject's visits and course through relevant discussions, reviews, and also specifically of this visit by routing of this note to the PI.  1. This visit is a key visit of screening (screening, randomization, end of study). The PI is available for this visit.    2.  In addition, ahead of the key visit of screening,the visit and subject were discussed with the PI Dr Judeth Horn on date of 25 Mar 2023 over phone (video, phone, face to face) as part of direct PI oversight.      Signed by  Neita Garnet  Clinical Research Coordinator  [facility]  [place], [place] 4:20 PM 04/15/2023

## 2023-04-17 ENCOUNTER — Encounter

## 2023-04-23 ENCOUNTER — Encounter: Payer: Self-pay | Admitting: Internal Medicine

## 2023-04-23 ENCOUNTER — Ambulatory Visit: Admitting: Behavioral Health

## 2023-04-25 ENCOUNTER — Encounter: Payer: Self-pay | Admitting: Behavioral Health

## 2023-04-25 NOTE — Telephone Encounter (Signed)
 Hi [name]   -I was not told by the study coordinator you was sick that day.  I did look at the chest x-ray and yes there is more prominence in the lung tissue.  I think it is because of worsening fibrosis compared to the chest x-ray a year ago.  Many times the chest x-ray and fibrotic patient's when the fibrosis gets worse can mimic other diagnosis so the clinical context is important.  If you are not sick or having infection with like cough fever phlegm on the time of the chest x-ray I think we can watch

## 2023-04-25 NOTE — Telephone Encounter (Unsigned)
 Copied from CRM [id number]. Topic: Clinical - Medical Advice >> Apr 25, 2023 12:10 PM Isabell A wrote: Reason for CRM: Patient called and requested for me to re-send his MyChart message although being advised it was already sent to [facility].   A chest x-ray was ordered as part of the process to start eligibility for the clinical trial, and I had it on April 7.   The comments are as follows: Interval developing patchy airspace opacities in a patient with underlying interstitial lung disease. Finding may represent developing infection/inflammation. Follow-up PA and lateral chest X-ray is recommended in 3-4 weeks following therapy to ensure resolution. Electronically Signed By: Tish Frederickson M.D.    I'm concerned about the part that says there may be a developing infection/inflammation. I will have the follow-up x-ray. In the meantime, should I be concerned? I am reminded of the flare-up last July.

## 2023-05-13 ENCOUNTER — Other Ambulatory Visit: Payer: Self-pay | Admitting: Pulmonary Disease

## 2023-05-13 DIAGNOSIS — I2723 Pulmonary hypertension due to lung diseases and hypoxia: Secondary | ICD-10-CM

## 2023-05-13 DIAGNOSIS — Z006 Encounter for examination for normal comparison and control in clinical research program: Secondary | ICD-10-CM

## 2023-05-14 ENCOUNTER — Encounter: Payer: Self-pay | Admitting: Behavioral Health

## 2023-05-14 ENCOUNTER — Ambulatory Visit (INDEPENDENT_AMBULATORY_CARE_PROVIDER_SITE_OTHER): Admitting: Behavioral Health

## 2023-05-14 DIAGNOSIS — F330 Major depressive disorder, recurrent, mild: Secondary | ICD-10-CM

## 2023-05-14 NOTE — Progress Notes (Signed)
 [facility] Counselor Initial Adult Exam  Name: Dustin Dean Date: 05/14/2023 MRN: [id number] DOB: 05/29/1950 PCP: Tarri Farm, MD  Time spent: 55 minutes, 10 AM until 10:55 AM spent in person with the patient in the outpatient therapist office. The patient reports that physically not much has changed.  He meets with Dr. Marcheta Seta in a couple of weeks and hopes to have some clear answers as to what the doctor thinks might be taking place.  He has been doing some research in a healthy way to see if others who have gone through this supposed to do anything about timelines and expectations.  He already knows the physical signs to look for but wants to know if there is a more gradual or more rapid transition.  He feels that medication helps reduce his anxiety.  He does appear to be processing and handling this well.  He is going through the business to episodic in terms of financial and life care etc.  He and his wife talk about it honestly and openly.  He has not spoken to his adopted son Fawn Hooks at length yet but his wife Joie Narrow has their healthcare power of attorney after his wife and they plan to have a detailed conversation with her.  He also needs to change as well as his nephew was in as well and his nephew passed away several months ago.   The patient recently said something about 4 to 5 years and now that he would not be around in her response was "yes you will.  Realizes that is not my probability but did not say anything to her but says he does not like when he feels like people are placating him.  He knows it probably comes from her not being comfortable with that subject.  We talked about how to best handle those that make it easier for the patient.  He appears to be handling his anxiety and depression very well and feels that medication therapy things he is doing at home are keeping that manageable.  He does contract for safety having no thoughts of hurting himself or anyone  else.   Guardian/Payee: Self  Paperwork requested: Yes   Reason for Visit /Presenting Problem: Depression  . He does contract for safety having no thoughts of hurting himself or anyone else. Mental Status Exam: Appearance:   Fairly Groomed     Behavior:  Appropriate  Motor:  Normal  Speech/Language:   Clear and Coherent and Normal Rate  Affect:  Appropriate  Mood:  normal  Thought process:  normal  Thought content:    WNL  Sensory/Perceptual disturbances:    WNL  Orientation:  oriented to person, place, time/date, situation, and day of week  Attention:  Good  Concentration:  Good  Memory:  WNL  Fund of knowledge:   Good  Insight:    Good  Judgment:   Good  Impulse Control:  Good     Reported Symptoms: Depression  Risk Assessment: Danger to Self:  No Self-injurious Behavior: No Danger to Others: No Duty to Warn:no Physical Aggression / Violence:No  Access to Firearms a concern: No  Gang Involvement:No  Patient / guardian was educated about steps to take if suicide or homicide risk level increases between visits: n/a While future psychiatric events cannot be accurately predicted, the patient does not currently require acute inpatient psychiatric care and does not currently meet [place]  involuntary commitment criteria.  Substance Abuse History: Current substance abuse: No  Past Psychiatric History:   Previous psychological history is significant for depression Outpatient Providers: Primary care physician History of Psych Hospitalization:  None reported Psychological Testing:  n/a    Abuse History:  Victim of: No.,  None reported    Report needed: No. Victim of Neglect:No. Perpetrator of n/a Witness / Exposure to Domestic Violence: None reported  Protective Services Involvement: No  Witness to [employer] Violence:  No   Family History:  Family History  Problem Relation Age of Onset   Cancer Mother    Congestive Heart Failure Father    Prostate  cancer Father    Healthy Sister     Living situation: the patient lives with their spouse  Sexual Orientation: Straight  Relationship Status: married  Name of spouse / other: Did not discuss If a parent, number of children / ages: Adults  Support Systems: spouse  Financial Stress:  No   Income/Employment/Disability: Doctor, general practice: No   Educational History: Education: high school diploma/GED  Religion/Sprituality/World View: Did not discuss  Any cultural differences that may affect / interfere with treatment:  not applicable   Recreation/Hobbies: Time with family  Stressors: Health problems    Strengths: Supportive Relationships, Hopefulness, Self Advocate, and Able to Communicate Effectively  Barriers:     Legal History: Pending legal issue / charges: The patient has no significant history of legal issues. History of legal issue / charges:  Not applicable  Medical History/Surgical History: reviewed Past Medical History:  Diagnosis Date   Aortic stenosis    Flu    Pneumonia    Prostate cancer (HCC)    Pulmonary fibrosis (HCC)    Skin cancer     Past Surgical History:  Procedure Laterality Date   HERNIA REPAIR     NASAL SEPTUM SURGERY     SKIN CANCER EXCISION      Medications: Current Outpatient Medications  Medication Sig Dispense Refill   acetaminophen  (TYLENOL ) 325 MG tablet Take by mouth as needed.     aspirin-acetaminophen -caffeine (EXCEDRIN MIGRAINE) 250-250-65 MG tablet Take by mouth as needed.     Calcium Carb-Cholecalciferol (CALCIUM/VITAMIN D PO) Take 1 tablet by mouth daily.     Cetirizine HCl (ZYRTEC PO) Take by mouth.     ESBRIET  801 MG TABS Take 1 tablet (801 mg total) by mouth 3 (three) times daily with meals. 270 tablet 2   fluorouracil (EFUDEX) 5 % cream Apply topically.     fluticasone (FLONASE) 50 MCG/ACT nasal spray Place into both nostrils daily.     gabapentin (NEURONTIN) 300 MG capsule Take 600 mg  by mouth 3 (three) times daily.     ipratropium (ATROVENT) 0.06 % nasal spray Place 2 sprays into both nostrils 3 (three) times daily.     loperamide (IMODIUM) 2 MG capsule Take 1 mg by mouth once.     meclizine (ANTIVERT) 25 MG tablet Take 25 mg by mouth 2 (two) times daily as needed.     Multiple Vitamin (MULTIVITAMIN) capsule Take 1 capsule by mouth every morning.     naproxen sodium (ALEVE) 220 MG tablet Take 220 mg by mouth daily as needed.     omeprazole (PRILOSEC) 40 MG capsule Take 40 mg by mouth daily.     PAROXETINE HCL PO Take by mouth.     predniSONE  (DELTASONE ) 10 MG tablet 30mg  x 5 days; 20mg  x 5 days; 10mg  x 5 days; 5mg  x 5 days; then stop 35 tablet 0   rosuvastatin (CRESTOR) 5  MG tablet Take 1 tablet by mouth daily.     sildenafil (REVATIO) 20 MG tablet Take by mouth.     SUMAtriptan (IMITREX) 100 MG tablet TAKE ONE TABLET BY MOUTH AT ONSET OF HEADACHE; MAY REPEAT ONE TABLET IN 2 HOURS IF NEEDED. MAX OF 200 MG PER DAY     No current facility-administered medications for this visit.    Allergies  Allergen Reactions   Bee Venom Rash   Shellfish Allergy  Hives   Nintedanib Other (See Comments)    SIRS, fevers   Pseudoephedrine Anxiety    Diagnoses:  Major depressive disorder, recurrent, moderate, irritability Plan of Care: I will meet with the patient every 2 to 3 weeks in person Treatment plan: We will use cognitive behavioral therapy, even though some therapy as well as elements of dialectical behavior therapy to help the patient reduce his anxiety by at least 50% with a target date of  October 30th 2024.  Goals will be to help the patient better manage anxiety symptoms and stress, identify causes for anxiety including changes in his health and ways to lower the anxiety, resolve the conflicts contributing to anxiety and help him manage thoughts and worrisome thinking contributing to his anxiety.  Interventions include providing education about anxiety to help him understand  its causes symptoms and triggers..  We will facilitate problem solution skills as well as coping skills to help him manage anxiety and stress symptoms.  We will use cognitive behavioral therapy to identify and change anxiety provoking thought and behavior patterns as well as dialectical therapy to teach distress tolerance and mindfulness skills. Progress: 35% We will continue with treatment goals as listed above with a new target date of May 15, 2023                                  Cecile Coder, [facility]                  Cecile Coder, [facility]               Cecile Coder, [facility]               Cecile Coder, [facility]               Cecile Coder, [facility]               Cecile Coder, [facility]               Cecile Coder, [facility]               Cecile Coder, [facility]               Cecile Coder, [facility]               Cecile Coder, [facility]               Cecile Coder, [facility]               Cecile Coder, [facility]               Cecile Coder, [facility]               Cecile Coder, [facility] Counselor Initial Adult Exam  Name: Quishawn Fuselier Date: 05/14/2023 MRN: [id number] DOB: [birth year] PCP: Tarri Farm, MD  Time spent: 57 minutes, 11 AM until 11:57  AM spent in person with the patient in the outpatient therapist office.  The patient reported that he has met with his pulmonologist and his oxygen levels are staying steady.  That does not change what he has there is no cure but it can increase his quality of life or longer which she is thankful for.  He has decided to donate his body to [facility] for medical research and is happy about that decision.  He wants to be able to help even with that.  For the most part he is feeling fairly good physically.  He does  not necessarily have regrets but says at times he feels like there is somewhat of an aimless someone during them terms of not doing some small things that he wants to do.  He talked about looking through pictures or spending more time with a neighbor across the street whom he and his wife enjoyed talking to but they do not ever quite seem to make it work.  Would look at why and for homework I asked him to reach out to his neighbor and set up a time they could sit in each other's driveways and talk on a regular basis.  He did say that he feels his anxiety is manageable and depression is mild. He does contract for safety having no thoughts of hurting himself or anyone else.   Guardian/Payee: Self  Paperwork requested: Yes   Reason for Visit /Presenting Problem: Depression  . He does contract for safety having no thoughts of hurting himself or anyone else. Mental Status Exam: Appearance:   Fairly Groomed     Behavior:  Appropriate  Motor:  Normal  Speech/Language:   Clear and Coherent and Normal Rate  Affect:  Appropriate  Mood:  normal  Thought process:  normal  Thought content:    WNL  Sensory/Perceptual disturbances:    WNL  Orientation:  oriented to person, place, time/date, situation, and day of week  Attention:  Good  Concentration:  Good  Memory:  WNL  Fund of knowledge:   Good  Insight:    Good  Judgment:   Good  Impulse Control:  Good     Reported Symptoms: Depression  Risk Assessment: Danger to Self:  No Self-injurious Behavior: No Danger to Others: No Duty to Warn:no Physical Aggression / Violence:No  Access to Firearms a concern: No  Gang Involvement:No  Patient / guardian was educated about steps to take if suicide or homicide risk level increases between visits: n/a While future psychiatric events cannot be accurately predicted, the patient does not currently require acute inpatient psychiatric care and does not currently meet [place]  involuntary  commitment criteria.  Substance Abuse History: Current substance abuse: No     Past Psychiatric History:   Previous psychological history is significant for depression Outpatient Providers: Primary care physician History of Psych Hospitalization:  None reported Psychological Testing:  n/a    Abuse History:  Victim of: No.,  None reported    Report needed: No. Victim of Neglect:No. Perpetrator of n/a Witness / Exposure to Domestic Violence: None reported  Protective Services Involvement: No  Witness to [employer] Violence:  No   Family History:  Family History  Problem Relation Age of Onset   Cancer Mother    Congestive Heart Failure Father    Prostate cancer Father    Healthy Sister     Living situation: the patient lives with their spouse  Sexual Orientation: Straight  Relationship Status: married  Name of spouse / other: Did not discuss If a parent, number of children / ages: Adults  Support Systems: spouse  Financial Stress:  No   Income/Employment/Disability: Doctor, general practice: No   Educational History: Education: high school diploma/GED  Religion/Sprituality/World View: Did not discuss  Any cultural differences that may affect / interfere with treatment:  not applicable   Recreation/Hobbies: Time with family  Stressors: Health problems    Strengths: Supportive Relationships, Hopefulness, Self Advocate, and Able to Communicate Effectively  Barriers:     Legal History: Pending legal issue / charges: The patient has no significant history of legal issues. History of legal issue / charges:  Not applicable  Medical History/Surgical History: reviewed Past Medical History:  Diagnosis Date   Aortic stenosis    Flu    Pneumonia    Prostate cancer (HCC)    Pulmonary fibrosis (HCC)    Skin cancer     Past Surgical History:  Procedure Laterality Date   HERNIA REPAIR     NASAL SEPTUM SURGERY     SKIN CANCER EXCISION       Medications: Current Outpatient Medications  Medication Sig Dispense Refill   acetaminophen  (TYLENOL ) 325 MG tablet Take by mouth as needed.     aspirin-acetaminophen -caffeine (EXCEDRIN MIGRAINE) 250-250-65 MG tablet Take by mouth as needed.     Calcium Carb-Cholecalciferol (CALCIUM/VITAMIN D PO) Take 1 tablet by mouth daily.     Cetirizine HCl (ZYRTEC PO) Take by mouth.     ESBRIET  801 MG TABS Take 1 tablet (801 mg total) by mouth 3 (three) times daily with meals. 270 tablet 2   fluorouracil (EFUDEX) 5 % cream Apply topically.     fluticasone (FLONASE) 50 MCG/ACT nasal spray Place into both nostrils daily.     gabapentin (NEURONTIN) 300 MG capsule Take 600 mg by mouth 3 (three) times daily.     ipratropium (ATROVENT) 0.06 % nasal spray Place 2 sprays into both nostrils 3 (three) times daily.     loperamide (IMODIUM) 2 MG capsule Take 1 mg by mouth once.     meclizine (ANTIVERT) 25 MG tablet Take 25 mg by mouth 2 (two) times daily as needed.     Multiple Vitamin (MULTIVITAMIN) capsule Take 1 capsule by mouth every morning.     naproxen sodium (ALEVE) 220 MG tablet Take 220 mg by mouth daily as needed.     omeprazole (PRILOSEC) 40 MG capsule Take 40 mg by mouth daily.     PAROXETINE HCL PO Take by mouth.     predniSONE  (DELTASONE ) 10 MG tablet 30mg  x 5 days; 20mg  x 5 days; 10mg  x 5 days; 5mg  x 5 days; then stop 35 tablet 0   rosuvastatin (CRESTOR) 5 MG tablet Take 1 tablet by mouth daily.     sildenafil (REVATIO) 20 MG tablet Take by mouth.     SUMAtriptan (IMITREX) 100 MG tablet TAKE ONE TABLET BY MOUTH AT ONSET OF HEADACHE; MAY REPEAT ONE TABLET IN 2 HOURS IF NEEDED. MAX OF 200 MG PER DAY     No current facility-administered medications for this visit.    Allergies  Allergen Reactions   Bee Venom Rash   Shellfish Allergy  Hives   Nintedanib Other (See Comments)    SIRS, fevers   Pseudoephedrine Anxiety    Diagnoses:  Major depressive disorder, recurrent, moderate,  irritability Plan of Care: I will meet with the patient every 2 to 3 weeks in person Treatment plan: We will use  cognitive behavioral therapy, even though some therapy as well as elements of dialectical behavior therapy to help the patient reduce his anxiety by at least 50% with a target date of  October 30th 2024.  Goals will be to help the patient better manage anxiety symptoms and stress, identify causes for anxiety including changes in his health and ways to lower the anxiety, resolve the conflicts contributing to anxiety and help him manage thoughts and worrisome thinking contributing to his anxiety.  Interventions include providing education about anxiety to help him understand its causes symptoms and triggers..  We will facilitate problem solution skills as well as coping skills to help him manage anxiety and stress symptoms.  We will use cognitive behavioral therapy to identify and change anxiety provoking thought and behavior patterns as well as dialectical therapy to teach distress tolerance and mindfulness skills. Progress: 35% We will continue with treatment goals as listed above with a new target date of May 15, 2023                                  Cecile Coder, [facility]                  Cecile Coder, [facility]               Cecile Coder, [facility]               Cecile Coder, [facility]               Cecile Coder, [facility]               Cecile Coder, [facility]               Cecile Coder, [facility]               Cecile Coder, [facility]               Cecile Coder, [facility]               Cecile Coder, [facility]               Cecile Coder, [facility]               Cecile Coder, [facility]               Cecile Coder, [facility]               Cecile Coder, [facility]               Cecile Coder, [facility]               Cecile Coder, [facility]

## 2023-05-15 ENCOUNTER — Telehealth (HOSPITAL_COMMUNITY): Payer: Self-pay | Admitting: Cardiology

## 2023-05-15 ENCOUNTER — Other Ambulatory Visit: Payer: Self-pay | Admitting: Pulmonary Disease

## 2023-05-15 NOTE — Telephone Encounter (Signed)
 [employer] TEAM CALLED TO REQUEST [facility] FOR STUDY DB/DM/AS STUDY WINDOW 5/5-5/9  Cath information provided to [facility] with [employer]   [facility] [address] [place] [address] Dept: [phone] Loc: [phone]  Dustin Dean  05/15/2023  You are scheduled for a Cardiac Catheterization on Monday, May 5 with Dr.  Bruce Caper .  1. Please arrive at the [facility] (Main Entrance A) at [name]: [address] [place], [place] [address] at 5:30 AM (This time is 2 hour(s) before your procedure to ensure your preparation).   Free valet parking service is available. You will check in at ADMITTING. The support person will be asked to wait in the waiting room.  It is OK to have someone drop you off and come back when you are ready to be discharged.    Special note: Every effort is made to have your procedure done on time. Please understand that emergencies sometimes delay scheduled procedures.  2. Diet: Do not eat solid foods after midnight.  The patient may have clear liquids until 5am upon the day of the procedure.  3. Labs: You will need to have blood drawn prior to this procedure, a research representative will be in contact to arrange labs (BMET/CBC)  4. Medication instructions in preparation for your procedure:   Contrast Allergy : No       On the morning of your procedure, take your  morning medicines NOT listed above.  You may use sips of water.  5. Plan to go home the same day, you will only stay overnight if medically necessary. 6. Bring a current list of your medications and current insurance cards. 7. You MUST have a responsible person to drive you home. 8. Someone MUST be with you the first 24 hours after you arrive home or your discharge will be delayed. 9. Please wear clothes that are easy to get on and off and wear slip-on shoes.  Thank you for allowing us  to care for you!    -- [place] [facility]

## 2023-05-16 ENCOUNTER — Encounter (HOSPITAL_COMMUNITY): Payer: Self-pay | Admitting: Cardiology

## 2023-05-16 NOTE — Telephone Encounter (Signed)
 Pt returned call to report he is unavailable for 5/5  Reports 5/9 or 5/12 works for pt [facility] for pt Copy sent via my chart  See cath instructions below for details   [facility] [address] [place] [address] Dept: [phone] Loc: [phone]  Adrial Ty  05/16/2023  You are scheduled for a Cardiac Catheterization on Friday, May 9 with Dr. Jules Oar.  1. Please arrive at the [facility] (Main Entrance A) at [facility]: [address] [place], [place] [address] at 5:30 AM (This time is 2 hour(s) before your procedure to ensure your preparation).   Free valet parking service is available. You will check in at ADMITTING. The support person will be asked to wait in the waiting room.  It is OK to have someone drop you off and come back when you are ready to be discharged.    Special note: Every effort is made to have your procedure done on time. Please understand that emergencies sometimes delay scheduled procedures.  2. Diet: Do not eat solid foods after midnight.  The patient may have clear liquids until 5am upon the day of the procedure.  3. Labs: pre procedure labs are not needed at this time  4. Medication instructions in preparation for your procedure:   Contrast Allergy : No     On the morning of your procedure, take  any morning medicines NOT listed above.  You may use sips of water.  5. Plan to go home the same day, you will only stay overnight if medically necessary. 6. Bring a current list of your medications and current insurance cards. 7. You MUST have a responsible person to drive you home. 8. Someone MUST be with you the first 24 hours after you arrive home or your discharge will be delayed. 9. Please wear clothes that are easy to get on and off and wear slip-on shoes.  Thank you for allowing us  to care for you!   -- [place] [facility]

## 2023-05-20 ENCOUNTER — Telehealth: Payer: Self-pay | Admitting: Pulmonary Disease

## 2023-05-20 ENCOUNTER — Other Ambulatory Visit: Payer: Self-pay

## 2023-05-20 NOTE — Telephone Encounter (Addendum)
 CRC Melayah Skorupski Almeda Jacobs called patient [id number] for [employer] study on 20 May 2023 at 13:19 pm and reminded them about their echocardiogram appointment tomorrow,21 May 2023  at [address],[place] at 2 pm. Patient confirmed that they will be going for the echo on 21 May 2023 and are doing good health wise.   CRC has shared the number with the patient, of the Echo schedulling person incase of any help needed locating the office. Transport has been booked through [employer]) for 21 May 2023 around 1:30 pm from patient residence to clinic and back forth.  Routing this note to PI,Matt Hunsucker.

## 2023-05-21 ENCOUNTER — Ambulatory Visit (HOSPITAL_COMMUNITY)
Admission: RE | Admit: 2023-05-21 | Discharge: 2023-05-21 | Disposition: A | Discharge status: Home / Self Care | Source: Ambulatory Visit | Attending: Pulmonary Disease | Admitting: Pulmonary Disease

## 2023-05-21 DIAGNOSIS — J849 Interstitial pulmonary disease, unspecified: Secondary | ICD-10-CM

## 2023-05-21 DIAGNOSIS — I272 Pulmonary hypertension, unspecified: Secondary | ICD-10-CM | POA: Insufficient documentation

## 2023-05-21 DIAGNOSIS — J449 Chronic obstructive pulmonary disease, unspecified: Secondary | ICD-10-CM | POA: Diagnosis not present

## 2023-05-21 DIAGNOSIS — I080 Rheumatic disorders of both mitral and aortic valves: Secondary | ICD-10-CM | POA: Insufficient documentation

## 2023-05-21 DIAGNOSIS — Z006 Encounter for examination for normal comparison and control in clinical research program: Secondary | ICD-10-CM

## 2023-05-22 LAB — ECHOCARDIOGRAM COMPLETE

## 2023-05-23 ENCOUNTER — Telehealth: Payer: Self-pay | Admitting: Pulmonary Disease

## 2023-05-23 NOTE — Telephone Encounter (Signed)
 CRC Audelia Knape Almeda Jacobs called Mr Handrich around  9:57 am on 23 May 2023 reminding about his appointment for Right Heart Catherization at 2 pm on 24 May 2023.Cardiology department rescheduled it from 7:30 am to 2pm on 24 May 2023. Patient was texting back but didn't pick the call.  CRC left him a voice note reminding him about going through the instructions RHC/cardiology office shared with him on his my chart and asked them to call the coordinator or [facility] office incase they have any question related to the procedure tomorrow. Patient was asked to have his recent medication list and remind the cardiology office that he is a research patient so he doesn't need to pay for the patient incase they have any payment question. Patient texted back at 10:06 AM on 23 May 2023 that they are all set for tomorrow's appointment. CRC has booked car service for tomorrow from patient's residence to [facility] office and back forth through [employer] group of [employer].   Routing this note to PI, [employer] for oversight.

## 2023-05-24 ENCOUNTER — Encounter (HOSPITAL_COMMUNITY)
Admission: RE | Disposition: A | Discharge status: Home / Self Care | Payer: Self-pay | Source: Home / Self Care | Attending: Internal Medicine

## 2023-05-24 ENCOUNTER — Other Ambulatory Visit: Payer: Self-pay

## 2023-05-24 ENCOUNTER — Ambulatory Visit (HOSPITAL_COMMUNITY)
Admission: RE | Admit: 2023-05-24 | Discharge: 2023-05-24 | Disposition: A | Discharge status: Home / Self Care | Attending: Internal Medicine | Admitting: Internal Medicine

## 2023-05-24 DIAGNOSIS — I2721 Secondary pulmonary arterial hypertension: Secondary | ICD-10-CM | POA: Insufficient documentation

## 2023-05-24 DIAGNOSIS — J849 Interstitial pulmonary disease, unspecified: Secondary | ICD-10-CM

## 2023-05-24 DIAGNOSIS — J8410 Pulmonary fibrosis, unspecified: Secondary | ICD-10-CM | POA: Insufficient documentation

## 2023-05-24 HISTORY — PX: RIGHT HEART CATH: CATH118263

## 2023-05-24 LAB — CBC
HCT: 38.4 % — ABNORMAL LOW (ref 39.0–52.0)
Hemoglobin: 12 g/dL — ABNORMAL LOW (ref 13.0–17.0)
MCH: 27.3 pg (ref 26.0–34.0)
MCHC: 31.3 g/dL (ref 30.0–36.0)
MCV: 87.5 fL (ref 80.0–100.0)
Platelets: 147 10*3/uL — ABNORMAL LOW (ref 150–400)
RBC: 4.39 MIL/uL (ref 4.22–5.81)
RDW: 15.3 % (ref 11.5–15.5)
WBC: 7.5 10*3/uL (ref 4.0–10.5)
nRBC: 0 % (ref 0.0–0.2)

## 2023-05-24 LAB — POCT I-STAT EG7
Acid-Base Excess: 2 mmol/L (ref 0.0–2.0)
Acid-Base Excess: 5 mmol/L — ABNORMAL HIGH (ref 0.0–2.0)
Acid-Base Excess: 5 mmol/L — ABNORMAL HIGH (ref 0.0–2.0)
Bicarbonate: 28.8 mmol/L — ABNORMAL HIGH (ref 20.0–28.0)
Bicarbonate: 31.5 mmol/L — ABNORMAL HIGH (ref 20.0–28.0)
Bicarbonate: 32.5 mmol/L — ABNORMAL HIGH (ref 20.0–28.0)
Calcium, Ion: 1 mmol/L — ABNORMAL LOW (ref 1.15–1.40)
Calcium, Ion: 1.22 mmol/L (ref 1.15–1.40)
Calcium, Ion: 1.24 mmol/L (ref 1.15–1.40)
HCT: 33 % — ABNORMAL LOW (ref 39.0–52.0)
HCT: 36 % — ABNORMAL LOW (ref 39.0–52.0)
HCT: 37 % — ABNORMAL LOW (ref 39.0–52.0)
Hemoglobin: 11.2 g/dL — ABNORMAL LOW (ref 13.0–17.0)
Hemoglobin: 12.2 g/dL — ABNORMAL LOW (ref 13.0–17.0)
Hemoglobin: 12.6 g/dL — ABNORMAL LOW (ref 13.0–17.0)
O2 Saturation: 70 %
O2 Saturation: 72 %
O2 Saturation: 74 %
Potassium: 3.4 mmol/L — ABNORMAL LOW (ref 3.5–5.1)
Potassium: 4 mmol/L (ref 3.5–5.1)
Potassium: 4 mmol/L (ref 3.5–5.1)
Sodium: 142 mmol/L (ref 135–145)
Sodium: 142 mmol/L (ref 135–145)
Sodium: 146 mmol/L — ABNORMAL HIGH (ref 135–145)
TCO2: 30 mmol/L (ref 22–32)
TCO2: 33 mmol/L — ABNORMAL HIGH (ref 22–32)
TCO2: 34 mmol/L — ABNORMAL HIGH (ref 22–32)
pCO2, Ven: 52.5 mmHg (ref 44–60)
pCO2, Ven: 56.6 mmHg (ref 44–60)
pCO2, Ven: 58.2 mmHg (ref 44–60)
pH, Ven: 7.347 (ref 7.25–7.43)
pH, Ven: 7.354 (ref 7.25–7.43)
pH, Ven: 7.355 (ref 7.25–7.43)
pO2, Ven: 39 mmHg (ref 32–45)
pO2, Ven: 41 mmHg (ref 32–45)
pO2, Ven: 42 mmHg (ref 32–45)

## 2023-05-24 LAB — BASIC METABOLIC PANEL WITH GFR
Anion gap: 6 (ref 5–15)
BUN: 14 mg/dL (ref 8–23)
CO2: 32 mmol/L (ref 22–32)
Calcium: 9.1 mg/dL (ref 8.9–10.3)
Chloride: 99 mmol/L (ref 98–111)
Creatinine, Ser: 0.8 mg/dL (ref 0.61–1.24)
GFR, Estimated: >60 mL/min (ref >60)
Glucose, Bld: 97 mg/dL (ref 70–99)
Potassium: 4.2 mmol/L (ref 3.5–5.1)
Sodium: 137 mmol/L (ref 135–145)

## 2023-05-24 SURGERY — RIGHT HEART CATH
Anesthesia: LOCAL

## 2023-05-24 MED ORDER — LIDOCAINE HCL (PF) 1 % IJ SOLN
INTRAMUSCULAR | Status: DC | PRN
  Administered 2023-05-24: 2 mL

## 2023-05-24 MED ORDER — SODIUM CHLORIDE 0.9 % IV SOLN: 250.0000 mL | INTRAVENOUS | Status: DC | PRN

## 2023-05-24 MED ORDER — SODIUM CHLORIDE 0.9 % IV SOLN: INTRAVENOUS | Status: DC

## 2023-05-24 MED ORDER — ACETAMINOPHEN 325 MG PO TABS: 650.0000 mg | ORAL_TABLET | ORAL | Status: DC | PRN

## 2023-05-24 MED ORDER — HEPARIN (PORCINE) IN NACL 1000-0.9 UT/500ML-% IV SOLN
INTRAVENOUS | Status: DC | PRN
  Administered 2023-05-24: 1000 mL via INTRAVENOUS

## 2023-05-24 MED ORDER — ONDANSETRON HCL 4 MG/2ML IJ SOLN: 4.0000 mg | Freq: Four times a day (QID) | INTRAMUSCULAR | Status: DC | PRN

## 2023-05-24 MED ORDER — LIDOCAINE HCL (PF) 1 % IJ SOLN
INTRAMUSCULAR | Status: AC
  Filled 2023-05-24: qty 30

## 2023-05-24 MED ORDER — SODIUM CHLORIDE 0.9% FLUSH: 3.0000 mL | Freq: Two times a day (BID) | INTRAVENOUS | Status: DC

## 2023-05-24 MED ORDER — HYDRALAZINE HCL 20 MG/ML IJ SOLN: 10.0000 mg | INTRAMUSCULAR | Status: DC | PRN

## 2023-05-24 MED ORDER — SODIUM CHLORIDE 0.9% FLUSH: 3.0000 mL | INTRAVENOUS | Status: DC | PRN

## 2023-05-24 MED ORDER — LABETALOL HCL 5 MG/ML IV SOLN: 10.0000 mg | INTRAVENOUS | Status: DC | PRN

## 2023-05-24 SURGICAL SUPPLY — 5 items
CATH SWAN GANZ 7F STRAIGHT (CATHETERS) IMPLANT
GLIDESHEATH SLENDER 7FR .021G (SHEATH) IMPLANT
PACK CARDIAC CATHETERIZATION (CUSTOM PROCEDURE TRAY) ×1 IMPLANT
TRANSDUCER W/STOPCOCK (MISCELLANEOUS) IMPLANT
TUBING ART PRESS 72 MALE/FEM (TUBING) IMPLANT

## 2023-05-24 NOTE — Discharge Instructions (Signed)

## 2023-05-24 NOTE — Progress Notes (Signed)
 No changes to medications per MD Bensimhon post cath.

## 2023-05-24 NOTE — H&P (Signed)
 Advanced Heart Failure Team History and Physical Note   PCP:  Tarri Farm, MD  PCP-Cardiology: None     Reason for Admission: RHC   HPI:     Dustin Dean is a 73 y/o with past medical history of restrictive lung disease, interstitial lung disease, allergic rhinitis, laryngeal pharyngeal reflux, arthritis, lung nodules and persistent cough. He is a non-smoker and has never smoked.   Has marked DOE. NYHA III.  He presents today for RHC due to ILD as part of PULMONVAN trial   Home Medications Prior to Admission medications   Medication Sig Start Date End Date Taking? Authorizing Provider  acetaminophen  (TYLENOL ) 500 MG tablet Take 500 mg by mouth every 8 (eight) hours as needed for moderate pain (pain score 4-6).   Yes [provider]  aspirin EC 81 MG tablet Take 81 mg by mouth daily. Swallow whole.   Yes [provider]  Calcium Carbonate-Vitamin D (CALCIUM 600-VITAMIN D3 PO) Take 1 tablet by mouth daily.   Yes [provider]  cetirizine (ZYRTEC) 10 MG tablet Take 10 mg by mouth daily.   Yes [provider]  ESBRIET  801 MG TABS Take 1 tablet (801 mg total) by mouth 3 (three) times daily with meals. 10/12/22  Yes Ramaswamy, Murali, MD  fluticasone (FLONASE) 50 MCG/ACT nasal spray Place 2 sprays into both nostrils daily.   Yes [provider]  gabapentin (NEURONTIN) 300 MG capsule Take 900 mg by mouth 3 (three) times daily. 09/07/20  Yes [provider]  ipratropium (ATROVENT) 0.06 % nasal spray Place 2 sprays into both nostrils 3 (three) times daily.   Yes [provider]  loperamide (IMODIUM) 2 MG capsule Take 1 mg by mouth daily. 01/16/08  Yes [provider]  meclizine (ANTIVERT) 25 MG tablet Take 25 mg by mouth 2 (two) times daily as needed for dizziness. 10/18/20  Yes [provider]  Misc Natural Products (ELDERBERRY IMMUNE COMPLEX PO) Take 2 tablets by mouth daily.   Yes [provider]   Multiple Vitamin (MULTIVITAMIN) tablet Take 1 tablet by mouth daily.   Yes [provider]  omeprazole (PRILOSEC) 40 MG capsule Take 40 mg by mouth daily. 09/27/20  Yes [provider]  PARoxetine (PAXIL) 20 MG tablet Take 20 mg by mouth daily.   Yes [provider]  rosuvastatin (CRESTOR) 5 MG tablet Take 5 mg by mouth daily. 09/16/18  Yes [provider]  sildenafil (REVATIO) 20 MG tablet Take 20 mg by mouth 3 (three) times daily. 10/23/22  Yes [provider]  tamsulosin (FLOMAX) 0.4 MG CAPS capsule Take 0.4 mg by mouth at bedtime. 05/13/23  Yes [provider]  fluorouracil (EFUDEX) 5 % cream Apply 1 Application topically 2 (two) times daily as needed (irritation). 12/21/20   [provider]  SUMAtriptan (IMITREX) 100 MG tablet Take 100 mg by mouth every 2 (two) hours as needed for migraine. 10/13/18   [provider]    Past Medical History: Past Medical History:  Diagnosis Date   Aortic stenosis    Flu    Pneumonia    Prostate cancer (HCC)    Pulmonary fibrosis (HCC)    Skin cancer     Past Surgical History: Past Surgical History:  Procedure Laterality Date   HERNIA REPAIR     NASAL SEPTUM SURGERY     SKIN CANCER EXCISION      Family History:  Family History  Problem Relation Age of Onset  Cancer Mother    Congestive Heart Failure Father    Prostate cancer Father    Healthy Sister     Social History: Social History   Socioeconomic History   Marital status: Married    Spouse name: Not on file   Number of children: Not on file   Years of education: Not on file   Highest education level: Bachelor's degree (e.g., BA, AB, BS)  Occupational History   Not on file  Tobacco Use   Smoking status: Never    Passive exposure: Past   Smokeless tobacco: Never  Vaping Use   Vaping status: Never Used  Substance and Sexual Activity   Alcohol use: Not Currently   Drug use: Never   Sexual activity: Not on  file  Other Topics Concern   Not on file  Social History Narrative   Not on file   Social Drivers of Health   Financial Resource Strain: Low Risk  (02/13/2023)   Received from [facility]   Overall Financial Resource Strain (CARDIA)    Difficulty of Paying Living Expenses: Not hard at all  Food Insecurity: No Food Insecurity (02/13/2023)   Received from [facility]   Hunger Vital Sign    Worried About Running Out of Food in the Last Year: Never true    Ran Out of Food in the Last Year: Never true  Transportation Needs: No Transportation Needs (02/13/2023)   Received from [facility] - Transportation    Lack of Transportation (Medical): No    Lack of Transportation (Non-Medical): No  Physical Activity: Sufficiently Active (10/09/2022)   Received from [facility]   Exercise Vital Sign    Days of Exercise per Week: 5 days    Minutes of Exercise per Session: 30 min  Stress: No Stress Concern Present (10/09/2022)   Received from [facility] - Occupational Stress Questionnaire    Feeling of Stress : Not at all  Social Connections: Moderately Integrated (10/09/2022)   Received from [facility]   Social Network    How would you rate your social network (family, work, friends)?: Adequate participation with social networks    Allergies:  Allergies  Allergen Reactions   Bee Venom Rash   Shellfish Allergy  Hives   Nintedanib Other (See Comments)    SIRS, fevers   Pseudoephedrine Anxiety    Objective:    Vital Signs:   Temp:  [98 F (36.7 C)] 98 F (36.7 C) (05/09 1245) Pulse Rate:  [78] 78 (05/09 1245) Resp:  [18] 18 (05/09 1245) BP: (139)/(77) 139/77 (05/09 1245) SpO2:  [99 %-100 %] 100 % (05/09 1753) Weight:  [90.7 kg] 90.7 kg (05/09 1245)   Filed Weights   05/24/23 1245  Weight: 90.7 kg     Physical Exam     General:  Well appearing. No respiratory difficulty HEENT: Normal Neck: Supple. no JVD.  Carotids 2+ bilat; no bruits. No lymphadenopathy or thyromegaly appreciated. Cor: PMI nondisplaced. Regular rate & rhythm. No rubs, gallops or murmurs. Lungs: Decreased throughout Abdomen: Soft, nontender, nondistended. No hepatosplenomegaly. No bruits or masses. Good bowel sounds. Extremities: No cyanosis, clubbing, rash, edema Neuro: Alert & oriented x 3, cranial nerves grossly intact. moves all 4 extremities w/o difficulty. Affect pleasant.   Telemetry   Sinus 70s Personally reviewed   Labs     Basic Metabolic Panel: Recent Labs  Lab 05/24/23 1229  NA 137  K 4.2  CL 99  CO2 32  GLUCOSE 97  BUN 14  CREATININE 0.80  CALCIUM 9.1    Liver Function Tests: No results for input(s): "AST", "ALT", "ALKPHOS", "BILITOT", "PROT", "ALBUMIN" in the last 168 hours. No results for input(s): "LIPASE", "AMYLASE" in the last 168 hours. No results for input(s): "AMMONIA" in the last 168 hours.  CBC: Recent Labs  Lab 05/24/23 1229  WBC 7.5  HGB 12.0*  HCT 38.4*  MCV 87.5  PLT 147*    Cardiac Enzymes: No results for input(s): "CKTOTAL", "CKMB", "CKMBINDEX", "TROPONINI" in the last 168 hours.  BNP: BNP (last 3 results) No results for input(s): "BNP" in the last 8760 hours.  ProBNP (last 3 results) No results for input(s): "PROBNP" in the last 8760 hours.   CBG: No results for input(s): "GLUCAP" in the last 168 hours.  Coagulation Studies: No results for input(s): "LABPROT", "INR" in the last 72 hours.  Imaging: No results found.   Assessment/Plan   1. ILD with possible PAH - patient presents today for RHC as part of the PULMOVAN trial  - Consent obtained. Will proceed.   Jules Oar, MD 05/24/2023, 6:02 PM Advanced Heart Failure Team Pager [phone] (M-F; 7a - 5p)  Please contact [facility] for night-coverage after hours (4p -7a ) and weekends on [email or web address]

## 2023-05-25 ENCOUNTER — Encounter (HOSPITAL_COMMUNITY): Payer: Self-pay | Admitting: Internal Medicine

## 2023-06-03 ENCOUNTER — Ambulatory Visit (HOSPITAL_COMMUNITY)

## 2023-06-04 ENCOUNTER — Encounter: Payer: Self-pay | Admitting: Behavioral Health

## 2023-06-04 ENCOUNTER — Ambulatory Visit (INDEPENDENT_AMBULATORY_CARE_PROVIDER_SITE_OTHER): Admitting: Behavioral Health

## 2023-06-04 DIAGNOSIS — F411 Generalized anxiety disorder: Secondary | ICD-10-CM

## 2023-06-04 DIAGNOSIS — F331 Major depressive disorder, recurrent, moderate: Secondary | ICD-10-CM | POA: Diagnosis not present

## 2023-06-04 DIAGNOSIS — R454 Irritability and anger: Secondary | ICD-10-CM

## 2023-06-04 NOTE — Progress Notes (Signed)
 [facility] Counselor Initial Adult Exam  Name: Dustin Dean Date: 06/04/2023 MRN: [id number] DOB: Jan 12, 1951 PCP: Tarri Farm, MD  Time spent: 57 minutes, 3 PM until 3:57 PM spent in person with the patient in the outpatient therapist office. For the most part the patient reports that he is still doing well and reports no significant change in his health.  He has started reaching out to several people especially that he has to work within the past whom he enjoyed working with and checking in with them.  He is briefly telling people about his health condition but is more using it as a venue to thanked him for the time that he worked with him to tell him how much she enjoyed and appreciated them.  In his birth several email and phone conversations which she appreciates.  He has 3 or 4 more that he wants to complete.  After he finishes those he is going to start going through pictures because he knows that is not something his wife wants to do.  He remarked that one of the things that he misses his time hanging out with him talking to other guys.  He used to be a group of 6 men that he went to breakfast with but all but one of them have either moved or passed away.  He is going to reach out to the family that he and his wife have adopted to see if he can have lunch on a regular basis.  There is also a neighbor across the street who he enjoys time with so he is going to reach out to him about finding a time to sit in the driveway and talk on a regular basis as well as invite that gentleman and his wife over to their house for his birthday next week.  Ask him to continue to look for ways to connect socially) with guys that he knows and enjoys spending time with. He does contract for safety having no thoughts of hurting himself or anyone else.   Guardian/Payee: Self  Paperwork requested: Yes   Reason for Visit /Presenting Problem: Depression  . He does contract for safety having no  thoughts of hurting himself or anyone else. Mental Status Exam: Appearance:   Fairly Groomed     Behavior:  Appropriate  Motor:  Normal  Speech/Language:   Clear and Coherent and Normal Rate  Affect:  Appropriate  Mood:  normal  Thought process:  normal  Thought content:    WNL  Sensory/Perceptual disturbances:    WNL  Orientation:  oriented to person, place, time/date, situation, and day of week  Attention:  Good  Concentration:  Good  Memory:  WNL  Fund of knowledge:   Good  Insight:    Good  Judgment:   Good  Impulse Control:  Good     Reported Symptoms: Depression  Risk Assessment: Danger to Self:  No Self-injurious Behavior: No Danger to Others: No Duty to Warn:no Physical Aggression / Violence:No  Access to Firearms a concern: No  Gang Involvement:No  Patient / guardian was educated about steps to take if suicide or homicide risk level increases between visits: n/a While future psychiatric events cannot be accurately predicted, the patient does not currently require acute inpatient psychiatric care and does not currently meet [place]  involuntary commitment criteria.  Substance Abuse History: Current substance abuse: No     Past Psychiatric History:   Previous psychological history is significant for depression Outpatient Providers: Primary care  physician History of Psych Hospitalization: None reported Psychological Testing: n/a   Abuse History:  Victim of: No., None reported   Report needed: No. Victim of Neglect:No. Perpetrator of n/a Witness / Exposure to Domestic Violence: None reported  Protective Services Involvement: No  Witness to [employer] Violence:  No   Family History:  Family History  Problem Relation Age of Onset   Cancer Mother    Congestive Heart Failure Father    Prostate cancer Father    Healthy Sister     Living situation: the patient lives with their spouse  Sexual Orientation: Straight  Relationship Status: married   Name of spouse / other: Did not discuss If a parent, number of children / ages: Adults  Support Systems: spouse  Financial Stress:  No   Income/Employment/Disability: Doctor, general practice: No   Educational History: Education: high school diploma/GED  Religion/Sprituality/World View: Did not discuss  Any cultural differences that may affect / interfere with treatment:  not applicable   Recreation/Hobbies: Time with family  Stressors: Health problems    Strengths: Supportive Relationships, Hopefulness, Self Advocate, and Able to Communicate Effectively  Barriers:     Legal History: Pending legal issue / charges: The patient has no significant history of legal issues. History of legal issue / charges: Not applicable  Medical History/Surgical History: reviewed Past Medical History:  Diagnosis Date   Aortic stenosis    Flu    Pneumonia    Prostate cancer (HCC)    Pulmonary fibrosis (HCC)    Skin cancer     Past Surgical History:  Procedure Laterality Date   HERNIA REPAIR     NASAL SEPTUM SURGERY     RIGHT HEART CATH N/A 05/24/2023   Procedure: RIGHT HEART CATH;  Surgeon: Mardell Shade, MD;  [facility];  Service: Cardiovascular;  Laterality: N/A;   SKIN CANCER EXCISION      Medications: Current Outpatient Medications  Medication Sig Dispense Refill   acetaminophen  (TYLENOL ) 500 MG tablet Take 500 mg by mouth every 8 (eight) hours as needed for moderate pain (pain score 4-6).     aspirin EC 81 MG tablet Take 81 mg by mouth daily. Swallow whole.     Calcium Carbonate-Vitamin D (CALCIUM 600-VITAMIN D3 PO) Take 1 tablet by mouth daily.     cetirizine (ZYRTEC) 10 MG tablet Take 10 mg by mouth daily.     ESBRIET  801 MG TABS Take 1 tablet (801 mg total) by mouth 3 (three) times daily with meals. 270 tablet 2   fluorouracil (EFUDEX) 5 % cream Apply 1 Application topically 2 (two) times daily as needed (irritation).      fluticasone (FLONASE) 50 MCG/ACT nasal spray Place 2 sprays into both nostrils daily.     gabapentin (NEURONTIN) 300 MG capsule Take 900 mg by mouth 3 (three) times daily.     ipratropium (ATROVENT) 0.06 % nasal spray Place 2 sprays into both nostrils 3 (three) times daily.     loperamide (IMODIUM) 2 MG capsule Take 1 mg by mouth daily.     meclizine (ANTIVERT) 25 MG tablet Take 25 mg by mouth 2 (two) times daily as needed for dizziness.     Misc Natural Products (ELDERBERRY IMMUNE COMPLEX PO) Take 2 tablets by mouth daily.     Multiple Vitamin (MULTIVITAMIN) tablet Take 1 tablet by mouth daily.     omeprazole (PRILOSEC) 40 MG capsule Take 40 mg by mouth daily.     PARoxetine (  PAXIL) 20 MG tablet Take 20 mg by mouth daily.     rosuvastatin (CRESTOR) 5 MG tablet Take 5 mg by mouth daily.     sildenafil (REVATIO) 20 MG tablet Take 20 mg by mouth 3 (three) times daily.     SUMAtriptan (IMITREX) 100 MG tablet Take 100 mg by mouth every 2 (two) hours as needed for migraine.     tamsulosin (FLOMAX) 0.4 MG CAPS capsule Take 0.4 mg by mouth at bedtime.     No current facility-administered medications for this visit.    Allergies  Allergen Reactions   Bee Venom Rash   Shellfish Allergy  Hives   Nintedanib Other (See Comments)    SIRS, fevers   Pseudoephedrine Anxiety    Diagnoses:  Major depressive disorder, recurrent, moderate, irritability Plan of Care: I will meet with the patient every 2 to 3 weeks in person Treatment plan: We will use cognitive behavioral therapy, even though some therapy as well as elements of dialectical behavior therapy to help the patient reduce his anxiety by at least 50% with a target date of  October 30th 2024.  Goals will be to help the patient better manage anxiety symptoms and stress, identify causes for anxiety including changes in his health and ways to lower the anxiety, resolve the conflicts contributing to anxiety and help him manage thoughts and worrisome  thinking contributing to his anxiety.  Interventions include providing education about anxiety to help him understand its causes symptoms and triggers..  We will facilitate problem solution skills as well as coping skills to help him manage anxiety and stress symptoms.  We will use cognitive behavioral therapy to identify and change anxiety provoking thought and behavior patterns as well as dialectical therapy to teach distress tolerance and mindfulness skills. Progress: 35% We will continue with treatment goals as listed above with a new target date of November 15, 2023                                  Cecile Coder, [facility]                  Cecile Coder, [facility]               Cecile Coder, [facility]               Cecile Coder, [facility]               Cecile Coder, [facility]               Cecile Coder, [facility]               Cecile Coder, [facility]               Cecile Coder, [facility]               Cecile Coder, [facility]               Cecile Coder, [facility]               Cecile Coder, [facility]               Cecile Coder, [facility]               Cecile Coder, [facility]               Cecile Coder, [facility]  Behavioral Health Counselor Initial Adult Exam  Name: Dustin Dean Date: 06/04/2023 MRN: [id number] DOB: May 29, 1950 PCP: Tarri Farm, MD  Time spent: 57 minutes, 11 AM until 11:57 AM spent in person with the patient in the outpatient therapist office.  The patient reported that he has met with his pulmonologist and his oxygen levels are staying steady.  That does not change what he has there is no cure but it can increase his quality of life or longer which she is thankful for.  He has decided to donate his body to [facility] for medical research and is happy about that  decision.  He wants to be able to help even with that.  For the most part he is feeling fairly good physically.  He does not necessarily have regrets but says at times he feels like there is somewhat of an aimless someone during them terms of not doing some small things that he wants to do.  He talked about looking through pictures or spending more time with a neighbor across the street whom he and his wife enjoyed talking to but they do not ever quite seem to make it work.  Would look at why and for homework I asked him to reach out to his neighbor and set up a time they could sit in each other's driveways and talk on a regular basis.  He did say that he feels his anxiety is manageable and depression is mild. He does contract for safety having no thoughts of hurting himself or anyone else.   Guardian/Payee: Self  Paperwork requested: Yes   Reason for Visit /Presenting Problem: Depression  . He does contract for safety having no thoughts of hurting himself or anyone else. Mental Status Exam: Appearance:   Fairly Groomed     Behavior:  Appropriate  Motor:  Normal  Speech/Language:   Clear and Coherent and Normal Rate  Affect:  Appropriate  Mood:  normal  Thought process:  normal  Thought content:    WNL  Sensory/Perceptual disturbances:    WNL  Orientation:  oriented to person, place, time/date, situation, and day of week  Attention:  Good  Concentration:  Good  Memory:  WNL  Fund of knowledge:   Good  Insight:    Good  Judgment:   Good  Impulse Control:  Good     Reported Symptoms: Depression  Risk Assessment: Danger to Self:  No Self-injurious Behavior: No Danger to Others: No Duty to Warn:no Physical Aggression / Violence:No  Access to Firearms a concern: No  Gang Involvement:No  Patient / guardian was educated about steps to take if suicide or homicide risk level increases between visits: n/a While future psychiatric events cannot be accurately predicted, the patient  does not currently require acute inpatient psychiatric care and does not currently meet [place]  involuntary commitment criteria.  Substance Abuse History: Current substance abuse: No     Past Psychiatric History:   Previous psychological history is significant for depression Outpatient Providers: Primary care physician History of Psych Hospitalization: None reported Psychological Testing: n/a   Abuse History:  Victim of: No., None reported   Report needed: No. Victim of Neglect:No. Perpetrator of n/a Witness / Exposure to Domestic Violence: None reported  Protective Services Involvement: No  Witness to [employer] Violence:  No   Family History:  Family History  Problem Relation Age of Onset   Cancer Mother    Congestive Heart Failure Father    Prostate cancer Father  Healthy Sister     Living situation: the patient lives with their spouse  Sexual Orientation: Straight  Relationship Status: married  Name of spouse / other: Did not discuss If a parent, number of children / ages: Adults  Support Systems: spouse  Financial Stress:  No   Income/Employment/Disability: Doctor, general practice: No   Educational History: Education: high school diploma/GED  Religion/Sprituality/World View: Did not discuss  Any cultural differences that may affect / interfere with treatment:  not applicable   Recreation/Hobbies: Time with family  Stressors: Health problems    Strengths: Supportive Relationships, Hopefulness, Self Advocate, and Able to Communicate Effectively  Barriers:     Legal History: Pending legal issue / charges: The patient has no significant history of legal issues. History of legal issue / charges: Not applicable  Medical History/Surgical History: reviewed Past Medical History:  Diagnosis Date   Aortic stenosis    Flu    Pneumonia    Prostate cancer (HCC)    Pulmonary fibrosis (HCC)    Skin cancer     Past Surgical  History:  Procedure Laterality Date   HERNIA REPAIR     NASAL SEPTUM SURGERY     RIGHT HEART CATH N/A 05/24/2023   Procedure: RIGHT HEART CATH;  Surgeon: Mardell Shade, MD;  Location: [facility];  Service: Cardiovascular;  Laterality: N/A;   SKIN CANCER EXCISION      Medications: Current Outpatient Medications  Medication Sig Dispense Refill   acetaminophen  (TYLENOL ) 500 MG tablet Take 500 mg by mouth every 8 (eight) hours as needed for moderate pain (pain score 4-6).     aspirin EC 81 MG tablet Take 81 mg by mouth daily. Swallow whole.     Calcium Carbonate-Vitamin D (CALCIUM 600-VITAMIN D3 PO) Take 1 tablet by mouth daily.     cetirizine (ZYRTEC) 10 MG tablet Take 10 mg by mouth daily.     ESBRIET  801 MG TABS Take 1 tablet (801 mg total) by mouth 3 (three) times daily with meals. 270 tablet 2   fluorouracil (EFUDEX) 5 % cream Apply 1 Application topically 2 (two) times daily as needed (irritation).     fluticasone (FLONASE) 50 MCG/ACT nasal spray Place 2 sprays into both nostrils daily.     gabapentin (NEURONTIN) 300 MG capsule Take 900 mg by mouth 3 (three) times daily.     ipratropium (ATROVENT) 0.06 % nasal spray Place 2 sprays into both nostrils 3 (three) times daily.     loperamide (IMODIUM) 2 MG capsule Take 1 mg by mouth daily.     meclizine (ANTIVERT) 25 MG tablet Take 25 mg by mouth 2 (two) times daily as needed for dizziness.     Misc Natural Products (ELDERBERRY IMMUNE COMPLEX PO) Take 2 tablets by mouth daily.     Multiple Vitamin (MULTIVITAMIN) tablet Take 1 tablet by mouth daily.     omeprazole (PRILOSEC) 40 MG capsule Take 40 mg by mouth daily.     PARoxetine (PAXIL) 20 MG tablet Take 20 mg by mouth daily.     rosuvastatin (CRESTOR) 5 MG tablet Take 5 mg by mouth daily.     sildenafil (REVATIO) 20 MG tablet Take 20 mg by mouth 3 (three) times daily.     SUMAtriptan (IMITREX) 100 MG tablet Take 100 mg by mouth every 2 (two) hours as needed for migraine.      tamsulosin (FLOMAX) 0.4 MG CAPS capsule Take 0.4 mg by mouth at bedtime.  No current facility-administered medications for this visit.    Allergies  Allergen Reactions   Bee Venom Rash   Shellfish Allergy  Hives   Nintedanib Other (See Comments)    SIRS, fevers   Pseudoephedrine Anxiety    Diagnoses:  Major depressive disorder, recurrent, moderate, irritability Plan of Care: I will meet with the patient every 2 to 3 weeks in person Treatment plan: We will use cognitive behavioral therapy, even though some therapy as well as elements of dialectical behavior therapy to help the patient reduce his anxiety by at least 50% with a target date of  October 30th 2024.  Goals will be to help the patient better manage anxiety symptoms and stress, identify causes for anxiety including changes in his health and ways to lower the anxiety, resolve the conflicts contributing to anxiety and help him manage thoughts and worrisome thinking contributing to his anxiety.  Interventions include providing education about anxiety to help him understand its causes symptoms and triggers..  We will facilitate problem solution skills as well as coping skills to help him manage anxiety and stress symptoms.  We will use cognitive behavioral therapy to identify and change anxiety provoking thought and behavior patterns as well as dialectical therapy to teach distress tolerance and mindfulness skills. Progress: 35% We will continue with treatment goals as listed above with a new target date of October 31st 2025                                  Cecile Coder, [facility]                  Cecile Coder, [facility]               Cecile Coder, [facility]               Cecile Coder, [facility]               Cecile Coder, [facility]               Cecile Coder, [facility]               Cecile Coder,  [facility]               Cecile Coder, [facility]               Cecile Coder, [facility]               Cecile Coder, [facility]               Cecile Coder, [facility]               Cecile Coder, [facility]               Cecile Coder, [facility]               Cecile Coder, [facility]               Cecile Coder, [facility]               Cecile Coder, [facility]               Cecile Coder, [facility]

## 2023-06-13 ENCOUNTER — Ambulatory Visit: Admitting: Internal Medicine

## 2023-06-13 DIAGNOSIS — Z7189 Other specified counseling: Secondary | ICD-10-CM

## 2023-06-13 DIAGNOSIS — J849 Interstitial pulmonary disease, unspecified: Secondary | ICD-10-CM

## 2023-06-13 DIAGNOSIS — I272 Pulmonary hypertension, unspecified: Secondary | ICD-10-CM

## 2023-06-13 DIAGNOSIS — J9611 Chronic respiratory failure with hypoxia: Secondary | ICD-10-CM

## 2023-06-13 LAB — PULMONARY FUNCTION TEST
FEF 25-75 Pre: 2.55 L/s
FEF2575-%Pred-Pre: 113 %
FEV1-%Pred-Pre: 54 %
FEV1-Pre: 1.61 L
FEV1FVC-%Pred-Pre: 121 %
FEV6-%Pred-Pre: 47 %
FEV6-Pre: 1.81 L
FEV6FVC-%Pred-Pre: 106 %
FVC-%Pred-Pre: 44 %
FVC-Pre: 1.81 L
Pre FEV1/FVC ratio: 89 %
Pre FEV6/FVC Ratio: 100 %

## 2023-06-13 NOTE — Patient Instructions (Signed)
 Spiro only performed today

## 2023-06-13 NOTE — Progress Notes (Signed)
 Spiro only performed today

## 2023-06-18 ENCOUNTER — Ambulatory Visit: Admitting: Internal Medicine

## 2023-06-18 VITALS — HR 80 | Ht 68.0 in | Wt 206.0 lb

## 2023-06-18 DIAGNOSIS — J849 Interstitial pulmonary disease, unspecified: Secondary | ICD-10-CM | POA: Diagnosis not present

## 2023-06-18 DIAGNOSIS — I2723 Pulmonary hypertension due to lung diseases and hypoxia: Secondary | ICD-10-CM

## 2023-06-18 DIAGNOSIS — J9611 Chronic respiratory failure with hypoxia: Secondary | ICD-10-CM

## 2023-06-18 NOTE — Patient Instructions (Addendum)
 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  Plan (shared decision making)  --Continue pirfenidone  801mg  x 3 times daily  - take with food  - space 5-6 hours apart  - apply sunscreen  - wear hat  - hydrate well   - do LFT  06/18/2023   - sidelanfil might not be beneifical for your type of pulmonary hypertension but ok to continue for now  - discussed transplant, chronic prednisone  and cellcept  - respect refusal  -  DO NOT Qualify or PHOCUS study with MOSLICIGUAT v Placebo  DUE TO LOW PVR    Follow-up  - 12-14  weeks 30-minute visit with Dr. Bertrum Brodie but after spirometry and DLCO.

## 2023-06-18 NOTE — Progress Notes (Signed)
 Cardiology Dr. Martyn Dean 02/15/20 Dustin Dean is 73 y.o. year old male with a history of No prior cardiovascular disease who presents to [facility] cardiology for the evaluation of a heart murmur. This patient is a non-smoker and denies a previous history of myocardial infarction, congestive heart failure, or valvular heart disease. 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 He really has little in the way of exertional chest symptoms. Occasionally according to the wife if he walks up an incline briskly he may have some minor shortness of breath but remains active and has little in the way limitation. He denies a previous history of myocardial infarction, congestive heart failure, or known valvular heart disease. At a recent examination he was found to have a heart murmur and an echocardiogram was performed. It demonstrated a peak gradient across his aortic valve of 30 mmHg and an aortic valve area of 1.1 cm. This was read as being consistent with moderate aortic stenosis. I had a fairly significant discussion with the patient and his wife about the implications of his aortic valve and the need to follow the aortic stenosis progression over time.  11/14/2020 office visit with Dustin Dean pulmonary physician assistant at [facility] has past medical history of restrictive lung disease, interstitial lung disease, allergic rhinitis, laryngeal pharyngeal reflux, arthritis, lung nodules and persistent cough. He is a non-smoker and has never smoked. He has medication allergy  to pseudoephedrine.  [name] presents today with complaint of a progressively worsening shortness of breath and dyspnea on exertion over the past several months. We have been following his interstitial lung disease since 2018. He has been stable without complaint of dyspnea on exertion over the past several years. He indicates that recently he has had more shortness  of breath with exertion. He and his wife will walk the dog in their usual route through the neighborhood that they have done for many years. He reports that he will get short of breath along the root in a way that he has never been bothered with previously. He has recently requested an albuterol inhaler and reports that it has been helpful. He has been using Symbicort 160/4.5 twice daily but has been having difficulty using it successfully as he has been coughing significantly and loses a lot of the medication. A recent chest CT conducted 10/24/2020 notes a progression in the interstitial changes. A 9 mm right lower lobe nodule is also noted. The plan is to have a 89-month follow-up chest CT conducted 02/04/2021. A 6-minute walk test conducted today shows oxygen desaturation and need for supplemental oxygen. I will place the order for supplemental oxygen. I will also refer him to the interstitial lung clinic at [facility] for further evaluation and treatment.   Patient at rest on RA with baseline sats : 97% After exertion for 3 min on RA pt's sats were : 86% Patient placed on oxygen via nasal cannula at 2 lpm flow via portable oxygen concentrator device Exertion for 6 mins with oxygen confirmed final sats at 94%   #1 lung nodules Chest CT 10/24/2020 shows 9 mm right lower lobe lung nodule Follow-up chest CT scheduled for 66-month reevaluation in January 2023  2. Interstitial lung disease Chest CT 11/04/2020 notes fibrotic interstitial lung disease with evidence of progression compared to 08/28/2020 Referral to interstitial lung clinic at [facility] for further evaluation and treatment   #  Primary Care dec 2022 Osteopenia Due for DXA. Prostate cancer Saw Dr. Dulcy Dean at [facility]. Biopsy + for cancer. They are just watching it. He is going to have MRI and another biopsy next visit.   OV 01/24/2021 -evaluation and transfer of care to Dr. Bertrum Dean in ILD center in [place].  Referred by  Dustin Dean patient support group leader for the pulmonary fibrosis foundation support group  Subjective:  Patient ID: Dustin Dean, male , DOB: 1950/11/10 , age 59 y.o. , MRN: [id number] , ADDRESS: [address] [place] [address] PCP Dustin Farm, MD Patient Care Team: Dustin Farm, MD as PCP - General (Family Medicine)  This Provider for this visit: Treatment Team:  Attending Provider: Maire Scot, MD    01/24/2021 -   Chief Complaint  Patient presents with   Consult    Pt is here for a switch of providers to take over care for his pulmonary fibrosis. 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 Pt does become SOB with activities and also states that he does have a chronic cough.     HPI  Dustin Dean 73 y.o. -new evaluation for interstitial lung disease.  History is provided by review of the chart and also talking to the patient and his wife.  He tells me that he has had chronic cough since his teenage years.  But for the last few to several years its been more consistent.  He was seeing the physician assistant at [facility].  Somewhere along the way he got referred to Dr. Eulis Dean right at [facility] some 3 years ago.  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  After that cough is largely improved except for occasional exacerbations in the fall in the winter.  Then in 2018 sometime after he went on gabapentin he was informed by the physician assistant that he might have early ILD.  He does not recollect any serology or biopsy being done.  He does recollect a few pulmonary function test.  Then all of a sudden starting September 2022 his cough "got out of hand" and also developed worsening shortness of breath.  This the first time he started developing worsening shortness of breath.  Then by November 2022 started needing exertional oxygen leading to current symptoms.  He was referred to [facility] but he preferred to establish with the  ILD center here in [place] with us  based on [facility] recommendation.  He found Dustin Dean through Internet search for pulmonary fibrosis foundation.  His current symptom score is below.  He is not on any antifibrotic.  [place] Integrated Comprehensive ILD Questionnaire       Past Medical History :  -He has obesity BMI 33-34 - Denies any collagen vascular disease - He is known to have aortic stenosis -sees cardiologist at [facility].  December 2021 echocardiogram EF 55 to 60% with tricuspid aortic valve and moderate stenosis normal right ventricle -Denies any asthma or COPD.  Denies any heart failure denies any collagen vascular disease -Denies snoring or excessive daytime somnolence.  Denies stroke.  Denies formal diagnosis of sleep apnea denies formal diagnosis of pulmonary hypertension [echo a year ago did not show pulmonary hypertension] -Has had vaccination against COVID but has not had COVID disease -Recent diagnosis of late 2022 early stage prostate cancer at [facility] urology on observation treatment -Has history of skin cancer for which he apply sunscreen regularly  ROS:  -Shortness of breath present  Cough chronic plus -Has intermittent chronic diarrhea and takes Imodium but no other GI side effects -Possible Raynaud's for the last several decades  FAMILY HISTORY of LUNG DISEASE:  Denies family for pulmonary fibrosis.  Denies family Struve COPD or asthma sarcoid or cystic fibrosis.  Denies family history of hypersensitive pneumonitis.  Denies autoimmune disease.  Denies any premature graying of the hair or albinism  PERSONAL EXPOSURE HISTORY:  No prior cigarette smoking -No marijuana use of cocaine use no intravenous drug use.  HOME  EXPOSURE and HOBBY DETAILS :  -Single-family home in a one-story space.  Dustin Dean setting age of the current home is 31 years.  He is lived there for 6 years.  He does use a feather pillow and this occasionally mold or mildew in the  bathroom.  All his life he is done active gardening with mulch and woodchips and occasionally damp soil.  He is worked out in the yard quite a bit and does gardening.  He enjoys the gardening.  OCCUPATIONAL HISTORY (122 questions) : -He worked in the [employer] at [employer] and then [employer] no [employer].  He does not remember if the buildings had mold in it.  He does not think so -Detail greater than 100 question organic and inorganic antigen history exposure is negative  PULMONARY TOXICITY HISTORY (27 items):  Denies  INVESTIGATIONS: Report of pulmonary function test but I am not able to get it in Care Everywhere       High-resolution CT scan of the chest August 2020  -There is a report of honeycombing but without any craniocaudal gradient.  Air-trapping reported alternative pattern more suggestive of chronic hypersensitive pneumonitis.  But there is already progression from August 2019 -Read by Dr. Alexandria Angel   CT Chest data- HRCT 11/04/20 unable for my visualization.   CLINICAL DATA:  Worsening shortness of breath   EXAM:  CT CHEST WITHOUT CONTRAST   TECHNIQUE:  Multidetector CT imaging of the chest was performed following the  standard protocol without intravenous contrast. High resolution  imaging of the lungs, as well as inspiratory and expiratory imaging,  was performed.   COMPARISON:  Chest CT dated August 29, 2018   FINDINGS:  Cardiovascular: Normal heart size. No pericardial effusion. Coronary  artery calcifications of the circumflex. Aortic valve  calcifications. Minimal calcified plaque of the thoracic aorta.   Mediastinum/Nodes: Mildly enlarged mediastinal lymph nodes,  unchanged compared to prior exam and likely reactive. Esophagus and  thyroid are unremarkable.   Lungs/Pleura: Central airways are patent. Bilateral air trapping.  Peribronchovascular and subpleural reticular opacities with traction  bronchiectasis and  no clear craniocaudal predominance. Honeycomb  change primarily seen in the anterior upper lobes is increased when  compared to prior exam. New linear nodular opacity of the right  lower lobe measuring up to 9 mm on series 4, image 210.   Upper Abdomen: No acute abnormality.   Musculoskeletal: No chest wall mass or suspicious bone lesions  identified.   IMPRESSION:  Fibrotic interstitial lung disease with evidence of progression when  compared with August 29, 2018 prior exam. Favor fibrotic  hypersensitivity pneumonitis given presence of air trapping.  Findings are suggestive of an alternative diagnosis (not UIP) per  consensus guidelines: Diagnosis of Idiopathic Pulmonary Fibrosis: An  Official ATS/ERS/JRS/ALAT Clinical Practice Guideline. Am Annie Barton  Crit Care Med Vol 198, Iss 5, [phone], Sep 15 2016.   New linear nodular opacity of the right lower lobe measuring  up to 9  mm, likely an area of focal fibrosis. Recommend follow-up chest CT  in 3 months to ensure stability.   Aortic Atherosclerosis (ICD10-I70.0).   Addendum 01/26/2021 -radiology Dr. Alfonse Angle wrote back saying CT scan is classic for chronic HP.  We will call the patient and get the patient started on nintedanib counseling which is first-line for progressive non-- IPF ILD.  If he is reluctant then we will do pirfenidone .  We will set up pharmacy counseling.  If he is reluctant for nintedanib based on risk versus benefit we will do pirfenidone   Electronically Signed    By: Avelino Lek M.D.    On: 11/04/2020 14:17  No results found.   Phone visit 01/28/21  Dustin Dean  LEt Dustin Dean know ahead of the visit followup with me on 02/23/21   A) making referral to [name] to discuss ofev which would be first line baed on what radiologist told me about CT and fact rheumaotid facot is positive  B) rheumatoid factor strongly positive - refer rheumatology Dr Deveshwar/Rice  C) I reviewed echo report (below) Will discuss  at followup  Aortic Valve: There is moderate stenosis, with peak and mean gradients  of 44.000 and 27.000 mmHg.  Aortic valve area calculates to approximately  1.4 cm    Mitral Valve: There is mild regurgitation.    Tricuspid Valve: There is mild regurgitation.    Tricuspid Valve: The right ventricular systolic pressure is normal (<36  mmHg).   1.  Adequate 2D M-mode and color flow Doppler echocardiogram demonstrates  normal left ventricular chamber size and contractility.  There may be mild  left ventricular hypertrophy.  Ejection fraction is estimated 65%  2.  The aortic valve is thickened and demonstrates reduced leaflet  mobility.  There is mild to moderate aortic stenosis with aortic valve  area calculating to 1.4 cm  3.  Mild mitral annular calcification is noted but there is good leaflet  mobility.  Mild mitral insufficiency is noted  4.  Tricuspid valve appears to be normal  5.  The atria are grossly normal size bilaterally  6.  The right ventricular chamber size and function is normal  6.  The pericardium is of normal thickness there is no effusion  OV 02/23/2021  Subjective:  Patient ID: Dustin Dean, male , DOB: 10/11/1950 , age 4 y.o. , MRN: [id number] , AD[address]ESS: [address] [place] [address] PCP Dustin Farm, MD Patient Care Team: Dustin Farm, MD as PCP - General (Family Medicine)  This Provider for this visit: Treatment Team:  Attending Provider: Maire Scot, MD    02/23/2021 -   Chief Complaint  Patient presents with   Follow-up    Pt recently had a HRCT and PFT and is here today to discuss the results.   Chronic cough on gabapentin  Interstitial lung disease work-up in progress -concern for hypersensitive pneumonitis  -Progressive phenotype between August 2020 and October 2022  9 mm right lower lobe nodule seen in October 2022 for the first time: Stable January 2023  HPI Dustin Dean 73 y.o. -since his last visit Dr. Alfonse Angle the  radiologist did look at the CT scan and this confirmed his CT is not consistent with UIP but more likely an alternate pattern consistent with hypersensitive pneumonitis as seen by air trapping.  Patient has had serology work-up in which it is all negative except rheumatoid factor strongly positive.  He is yet to see the rheumatologist.  He does have  significant restrictions on the PFTs with reduced DLCO consistent with his obesity and ILD.  His symptoms are overall stable.  Did go through his exposure history again.  He says in 200 07/2006 and again in 2012 he had had 3 episodes of pneumonia.  All of this happened when he entered school buildings.  He was living in the same house at that time but the house itself was brand-new and there is no mold or mildew.  He is wondering about crawlspace because only [place]  he has seen crawlspace but he said he has never been in the crawl space.  He does not know if there was mold in the crawl space.  I told him it is doubtful that mold or mildew from the crawlspace can get into the house.  He did have a feather pillow but since his last visit he is thrown that out.  He does do gardening and does get exposed to dampness.  At this point in time did discuss with him that he does have ILD and it is progressive.  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  Unclear if the positive rheumatoid factor is playing a role it could be if he has positive for rheumatoid arthritis.  Did indicate that ultimately only a biopsy can put to rest if he has IPF [being Caucasian gentleman greater than 65 is classic phenotype for IPF] but did indicate to him that given progression does not matter whether it is IPF or non-IPF antifibrotic's is indicated.  He is already met with the pharmacist and discussed the 2 antifibrotic's.  Did indicate to him that nintedanib is first-line and non-- IPF progressive phenotype.  Did discuss about the diarrhea.  Denies any  contraindication.  Therefore we will start this.  Did discuss whether diarrhea is willing to go through this.  Also discussed about ILD-Pro registry.  He is interested given the consent form.  Did indicate to him that it probably would be beneficial to do some kind of a limited work-up in differentiating his etiology.  The reasons would be to participate in clinical trials but also in case he needs immunosuppression such as prednisone  or CellCept [indicated for hypersensitive pneumonitis but not for IPF] having a specific diagnosis would be beneficial.  I did think that his obesity puts him at some risk for getting surgical lung biopsy and therefore it might be better to start off with bronchoscopy with lavage and transbronchial biopsies.  We can address this in the future.  This visit was just focused on therapies.  Regarding his 9 mm lower lobe nodule: He had a CT scan of the chest and shows this is stable from October 2022 through January 2023.  The CT scan again reads as alternative pattern consistent with chronic HP.    CT Chest data January 2023  No results found.  Mediastinum/Nodes: Multiple prominent borderline but nonenlarged mediastinal and bilateral hilar lymph nodes, similar to the prior study esophagus is unremarkable in appearance. No axillary lymphadenopathy.   Lungs/Pleura: High-resolution images again demonstrate widespread but patchy areas of ground-glass attenuation, septal thickening, subpleural reticulation, thickening of the peribronchovascular interstitium, cylindrical and varicose traction bronchiectasis, peripheral bronchiolectasis and extensive honeycombing. There is no discernible craniocaudal gradient. The most extensive areas of honeycombing are anterior lung, predominantly in the upper lobes and right middle lobe. Some areas of the lung bases demonstrate relative sparing. Inspiratory and expiratory imaging demonstrates mild to moderate air trapping indicative of  small airways disease. No definite  progression compared to the recent prior study. No acute consolidative airspace disease. No pleural effusions. Previously described nodular area of architectural distortion in the right lower lobe (axial image 106 of series 8) is stable measuring 8 mm on today's examination, likely part of the underlying fibrosis. No other definite suspicious appearing pulmonary nodules or masses are noted.   Upper Abdomen: Unremarkable.   Musculoskeletal: There are no aggressive appearing lytic or blastic lesions noted in the visualized portions of the skeleton.   IMPRESSION: 1. 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 2. Previously noted nodular area of architectural distortion in the right lower lobe is unchanged, likely part of the fibrosis rather than a pulmonary nodule. 3. There are calcifications of the aortic valve and mitral annulus. Echocardiographic correlation for evaluation of potential valvular dysfunction may be warranted if clinically indicated. 4. Aortic atherosclerosis, in addition to 2 vessel coronary artery disease. Please note that although the presence of coronary artery calcium documents the presence of coronary artery disease, the severity of this disease and any potential stenosis cannot be assessed on this non-gated CT examination. Assessment for potential risk factor modification, dietary therapy or pharmacologic therapy may be warranted, if clinically indicated.   Aortic Atherosclerosis (ICD10-I70.0).     Electronically Signed   By: Alexandria Angel M.D.   On: 02/14/2021 06:17 stable January 2023  PFT       Latest Reference Range & Units 01/27/21  15:53 01/27/21 15:54  Anti Nuclear Antibody (ANA) NEGATIVE   NEGATIVE  Angiotensin-Converting Enzyme 9 - 67 U/L  49  Anti JO-1 0.0 - 0.9 AI  <0.2  Cyclic Citrullin Peptide Dustin UNITS  <16  ds DNA Dustin IU/mL  1  ENA RNP Dustin 0.0 - 0.9 AI  0.4  RA Latex Turbid. <14 IU/mL  323 (H)  SSA (Ro) (ENA) Antibody, IgG <1.0 NEG AI  <1.0 NEG  SSB (La) (ENA) Antibody, IgG <1.0 NEG AI  <1.0 NEG  Scleroderma (Scl-70) (ENA) Antibody, IgG <1.0 NEG AI  <1.0 NEG  QUANTIFERON-TB GOLD PLUS   Rpt  A.Fumigatus #1 Abs Negative  Negative   Micropolyspora faeni, IgG Negative  Negative   Thermoactinomyces vulgaris, IgG Negative  Negative   A. Pullulans Abs Negative  Negative   Thermoact. Saccharii Negative  Negative   Pigeon Serum Abs Negative  Negative   (H): Data is abnormally high     OV 03/23/2021- acute video visit due to concerns for acute symptoms ? Ofev side effect  Subjective:  Patient ID: Dustin Dean, male , DOB: August 04, 1950 , age 70 y.o. , MRN: [id number] , ADDRESS: [address] [place] [address] PCP Dustin Farm, MD Patient Care Team: Dustin Farm, MD as PCP - General (Family Medicine)  This Provider for this visit: Treatment Team:  Attending Provider: Maire Scot, MD  Type of visit: Video Circumstance: COVID-19 national emergency Identification of patient Dustin Dean with Sep 23, 1950 and MRN [id number] - 2 person identifier Risks: Risks, benefits, limitations of telephone visit explained. Patient understood and verbalized agreement to proceed Anyone else on call: just patient Patient location: Inpatient bed at [facility] which is another health system This provider location: [address] pulmonary office [address]  [place], [place] [address].   03/23/2021 -in this video visit was set up  on acute basis because he started having symptoms after starting nintedanib.  He wanted to know if it was nintedanib symptoms.  The symptoms sound rather complex and little bit  out of proportion for nintedanib.  Therefore I scheduled this video visit and to my surprise he is sitting in a hospital bed at [facility] hospital.    HPI Anthonymichael Munday 73 y.o. - started ofev mid-feb 2023. Started noticing feeling disoriented and at times weak. Then on 3/4-03/19/21 was feeling very weak whole bidy weakness and needed walker to even go across room. Tired. Stopped going to gym. Was using o2 all the time. Then on 03/21/21 feelign cold. ANd ahd fever  102F.  Does not remember Tuesday much. Initialyt Dx is pneumonia and testing/imaging -> by tthen pccm saw him Dr Isreal Marinas. Procal negative. WBC normal. Concerns is ILD flare up and recommendation is prednisone .  CT scan did not show pneumonia Curently on IV solumedrol. Currently some better. Yesterday ate well. Currently on 2.5LN . He has been covid negative. Multiplex resp virus panel negative per outside chart revie  With ofev was having some nausea. No abd pain. No diarrhea. No stomach cramps. No vomitting   Currently ofev on hold    03/28/2021 Follow up : ILD , Post hospital follow up  Patient returns for a 1 week follow-up visit.  Patient was seen in January 2023 for second opinion for interstitial lung disease.  Patient was felt to have possible concern for hypersensitivity pneumonitis.  He has had a progressive phenotype noted on CT scan from August 2020 to October 2022.  Serology was negative except for a strongly positive rheumatoid factor.  He was referred to rheumatology.  PFT showed significant restriction and reduced DLCO.  Has a history of recurrent pneumonia.  He was recommended to begin antifibrotic's. With OFEV.  Patient was hospitalized last week with severe dyspnea, weakness, activity intolerance to point he could not walk. Confusion, low oxygen levels, and fever-tmax 102. Silvia Drummer he has a viral illness and ILD flare.  Patient was hospitalized at an outlying hospital.  He was treated with steroids.  Discharged on a  steroid taper. CT chest 03/22/21 report showed fibrotic ILD, stable since 10/2020, no acute consolidation.  Covid , Influezna  and Viral panel were neg.  Since getting out of the hospital patient is feeling much better. Energy level has picked, decreased oxygen demands, no fever. Appetite is some better . Patient is concerned this could have been from [facility] as his symptoms started when he started OFEV.  Is on Oxygen with activity and At bedtime  2l/m . At rest O2 sats are >90%.      MDD 03/28/21 MDD Impression/Recs: Recent flare up. Bx can be prohibitive. get rheum eval and if neg - manage as HP. Maybe at the most BAL if stable  OV 04/05/2021  Subjective:  Patient ID: Dustin Dean, male , DOB: 09/03/1950 , age 30 y.o. , MRN: [id number] , ADDRESS: [address] [place] [address] PCP Dustin Farm, MD Patient Care Team: Dustin Farm, MD as PCP - General (Family Medicine)  This Provider for this visit: Treatment Team:  Attending Provider: Maire Scot, MD    04/05/2021 -   Chief Complaint  Patient presents with   Follow-up    Pt states he is feeling better after last hospital stay and states each day is getting better.   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 -returns for followup. He is on 1.5 tab of pred curently . Next week is 1 tab pred and off. He is better. Feeling better. AT rest not using o2 because he is better. In fact symp score shows huge imrpovement. Walking desat test is also largey imprpvd. All ? Steroid effects. We had conversation about his recent flare up  - has hx of recurrent pna: 2005-2006 x 2 episodes. . Lived in a different  house. Then again pneumonia 5 years ago in same house. Rx in urgent care. Had high fever. Current started abruptly 2 weeks into ofev andhe wondering if due to ofev. Did not have classic Ofev sid effects. Mentioned to him Ofev reduces riks of flare.  - gardening: no longer gardening  - featherpillow - got rid of it even after last visit   -visible mold - denies but he discussed his crawl space. Says "there is no mold bu fungs on the top wall". 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 Biopsy indicated if stable but first rheum consult    04/26/2021- Interim hx  Patient presents today for ILD follow-up. He has been on OFEV for three weeks, currently taking 150mg  twice daily. He has been off oral prednisone  for two weeks. Breathing is not significantly worse off steroids. Cough is the same. Since Saturday he developed chills, fatigue and low grade fever at night. Temp 100.1-100.3. He reprots some heavy breathing. Oxygen 92% or higher. Urinary frequency. Having some incontinence. Continues on Gabapentin for chronic cough. Referred to rheumatology, has an apt beginning of June. He has been avoid exposures mold/mildew, gardening.     05/02/2021 Follow up : ILD , Fever  Patient presents for an acute office visit.  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  Serial CT chest from August 2020 to August 2022 showed progressive changes.  Serology was negative except for a positive rheumatoid factor.  He has been referred to rheumatology with consult pending.  Pulmonary function testing showed significant restriction and decreased DLCO.  Patient started Ofev on March 01, 2021.  Patient was admitted early March with severe weakness dyspnea and activity intolerance.  He also had intermittent confusion hypoxemia and a fever Tmax of 102.  He was felt to have an ILD flare with possible  viral illness however COVID, influenza and viral panel were all negative.  He was treated with steroids.  CT chest showed fibrotic ILD stable since October 2022.  His Ofev was held during admission.  After discharge he said that his energy level was improved and had decreased oxygen demands.  Based on oxygen after discharge was oxygen 2 L with activity and at bedtime.  Not requiring oxygen at rest with O2 saturations greater than 90%.  Patient was restarted on Ofev April 05, 2020.  After 2 weeks of been on Ofev patient developed low-grade fever.  This is waxed and waned and yesterday developed a fever Tmax of 103.  Patient went to the emergency room work-up was unrevealing.  CT chest showed stable ILD with no acute process.  CBC was normal blood cultures  are no growth to date.  Respiratory viral panel was negative.  Lactic acid was 1.5 urinalysis was normal, urine culture is pending.  LFTs were slightly elevated.  Since last night patient says he is feeling better. Fever is resolved. Took Tylenol  . O2 saturations are 100%.  Has some sore throat that started late night .  Had good appetite today . No fever today . No increased Oxygen demands.  Wife is [name].  Patient denies any rash, skin lesions, skin redness, joint swelling, insect or tick bite, travel, discolored mucus, abdominal pain, nausea vomiting or diarrhea. Has no previous joint replacement or surgeries. Abdominal hernia repair >95yrs ago. No abd pain,  Follow[facility]ology at [facility].  2D echo January 25, 2018 3 aortic valve is thickened with mild to moderate aortic valve stenosis.,  EF 55 to 60%.  Right ventricle is normal systolic function is normal.     OV 05/19/2021  Subjective:  Patient ID: Dustin Dean, male , DOB: 1950-04-01 , age 44 y.o. , MRN: [id number] , ADDRESS: [address] [place] [address] PCP Dustin Farm, MD Patient Care Team: Dustin Farm, MD as PCP - General (Family Medicine)  This Provider for this visit:  Treatment Team:  Attending Provider: Maire Scot, MD    05/19/2021 -   Chief Complaint  Patient presents with   Follow-up    PFT performed today.  Pt states since stopping the OFEV, he has not had anymore fever and states overall he has felt good.    Chronic cough on gabapentin  - well controlled  Interstitial lung disease work-up in progress -concern for hypersensitive pneumonitis  -Progressive phenotype between August 2020 and October 2022   - started ofev mid feb 2023 -> early march 2023 admitted for ILD flare ->   9 mm right lower lobe nodule seen in October 2022 for the first time: Stable January 2023  HPI Salil Raineri 73 y.o. -returns with wife [name].  He tells me that after last visit he did a rechallenge with nintedanib and he picked up fever and ended up in the emergency room very similar to March 2023 hospitalization.  Symptoms started 1-2 weeks into taking nintedanib.  His wife feels strongly this is nintedanib related.  Have not seen the patient gets significant side effects from nintedanib but have seen one of the patient gets something similar with pirfenidone .  That particular patient had even after 1 dose.  I think the strength of evidence here is that this systemic inflammatory response is probably related to nintedanib.  Therefore he has stopped it.  I have advised him against taking nintedanib ever again.  Clinically he is feeling stable.  His walking desaturation test is stable.  His pulmonary function test is also stable.  He uses 2 L with exertion but today in office when we walked him on room air he dropped 7 points and this is similar to before.  He wants to try pirfenidone .  We have discussed this in the past.  We also discussed prednisone  but he does not want to do prednisone  because of hyperglycemia.  We discussed the risk, benefits and limitations of pirfenidone .  Pirfenidone  is not well studied in chronic HP.  Is only studied in 1 subtype of non-IPF  progressive phenotype.  Nevertheless is the only other antifibrotic option available.  He wants to try a donor sample.  We will give him 2 months worth.  He is traveling to the beach  once he comes back  he will take it    07/03/2021 Follow up : ILD  Patient returns for a 6-week follow-up.  Patient has underlying interstitial lung disease with progressive phenotype.  Serial CT chest from August 20 to August 2022 showed progressive changes.  Serology was negative except for positive rheumatoid factor.  Rheumatology consult is pending.  Previous PFTs have showed significant restriction and decreased diffusing capacity.  Patient was started on Ofev March 01, 2021.  Patient had a SIRS like response to OFEV x 2 .  Ofev has been stopped indefinitely and advised not to restart.  Patient was placed on a drug holiday.  Started on Esbriet  1 month ago.  Patient says since starting Esbriet  he seems to be tolerating very well.  LFTs prior to start had returned back to normal.  Previously were elevated.  Patient says he has had minimal nausea.  No diarrhea no upset stomach and appetite seems to be normal.  Patient says he has been using sunscreen when he is out side.  Has had no rash.  We discussed returning for ongoing labs. Patient has been referred to pulmonary rehab but has not contacted today. Patient says overall he is doing okay.  Wears his oxygen with activity as needed.  And at bedtime.  Has had no increased oxygen demands. Patient says he still gets short of breath with heavy activities.  But feels like he is stable at this point.  Has had no return of fever.      OV 09/08/2021  Subjective:  Patient ID: Dustin Dean, male , DOB: May 07, 1950 , age 80 y.o. , MRN: [id number] , ADDRESS: [address] [place] [address] PCP Dustin Farm, MD Patient Care Team: Dustin Farm, MD as PCP - General (Family Medicine)  This Provider for this visit: Treatment Team:  Attending Provider: Maire Scot, MD    09/08/2021 -   Chief Complaint  Patient presents with   Follow-up    Pt states he has been doing okay since last visit and denies any complaints.      HPI Dustin Dean 73 y.o. -returns for follow-up.  He tells me that he is lost more weight.  He is stable.  His symptom score shows good stability.  He is tolerating pirfenidone  really well.  He has lost weight.  Current BMI is 31 but it is all intentional with the help of exercise, pirfenidone  and also eating less calories.  No side effects from pirfenidone  no sunburn nothing.  Is taking 3 to 2 pills 3 times daily.  He is willing to roll over to 1 big pill 3 times daily.  His main questions were -He wants to do some gardening such as cut wine but he assures me that will not be any exposure to organic dust.  -He wants to donate his body after death to medical school.  I advised him that either [facility] or [facility] is fine.  -He also has aortic stenosis.  He saw cardiology today at [facility].  He saw Dr. Martyn Dean.  He is being referred to surgery at [facility] for TAVR consideration.  According to the discussion [not clearly apparent in the chart review] TAVR is being recommended early because of his pulmonary fibrosis.  I did explain to him that in advance pulmonary fibrosis surgical risk is high but did want him to ask the surgeon if there is any downside to operating or placing TAVR procedure when the aortic stenosis is not  critical.  PFT   OV 11/17/2021  Subjective:  Patient ID: Dustin Dean, male , DOB: [birth year] , age 25 y.o. , MRN: [id number] , ADDRESS: [address] [place] [address] PCP Dustin Farm, MD Patient Care Team: Dustin Farm, MD as PCP - General (Family Medicine)  This Provider for this visit: Treatment Team:  Attending Provider: Maire Scot, MD    11/17/2021 -   Chief Complaint  Patient presents with   Follow-up    PFT performed today.  Pt  states he has multiple concerns to discuss.     HPI Dustin Dean 73 y.o. -returns for follow-up.  He is doing well overall.  However for the last 3 days he has had a runny nose and with that he is having decreased energy.  He is got decreased social interest.  He also feels his memory is failing him more in the last 3 days he has noted some memory changes for the last several months but it is a lot worse in the last 3 days since the runny nose.  His COVID is negative but there is no fever brown sputum or yellow sputum.  There is no significant drainage.  There is no worsening shortness of breath there is no worsening cough there is no chills.  In addition he has new complaint of tender itchy scalp for the last 3 weeks there is no rash but he has an appointment with dermatologist because his scalp is dry and flaky.  He is not doing gardening but he does go out occasionally in the sun and he does not wear sunscreen or hat.  He is on pirfenidone  which can cause skin rash and especially in the setting of sun exposure.  I did caution him this could be because of pirfenidone  but he is going to see a dermatologist anyways.  Outside of this he is tolerating his pirfenidone  well.  I did indicate to him he can roll himself into 1 big pill 3 times daily instead of 3 small pills 3 times daily for a total of 9 pills.  He also told me that he is at risk for depression.  He says in the past he has had significant depression he is on antidepressant.  He is worried this can come back with his ILD.  We talked about seeing Alecia Huntsman clinical psychologist who specializes in medical issues he is willing to see him.  Also discussed about doing light therapy  Preoperative evaluation: He has aortic stenosis.  He is saying now that it could be critical.  Apparently TAVR procedure is being planned date unknown.  Currently because of the cold he has suspended pulmonary rehabilitation he wants to know if he can go back.  I did  tell him we need to get clearance from the cardiologist because of critical aortic stenosis history I thought it was moderate in the past.          OV 02/26/2022  Subjective:  Patient ID: Dustin Dean, male , DOB: 30-Jul-1950 , age 26 y.o. , MRN: [id number] , ADDRESS: [address] [place] [address] PCP Dustin Farm, MD Patient Care Team: Dustin Farm, MD as PCP - General (Family Medicine)  This Provider for this visit: Treatment Team:  Attending Provider: Maire Scot, MD    HPI Dustin Dean 72 y.o. -presents for follow-up.  Presents with his wife.  His wife is an independent historian today.  Since I last saw him in November 2023 several issues have  happened.  1 in mid November 2023 had sinus congestion and he was given doxycycline  and prednisone .  Then in mid December 2023 he had TAVR procedure for his aortic stenosis.  He says this went really well and his murmur has now resolved.  Then straight after Christmas 2023 he had COVID-19.  WE called in molnupiravir .  He is tolerating his pirfenidone  at 801 mg pill 3 times daily without any problems.  He has lost some weight intentionally.  He does admit that after all this he is slightly more short of breath than before..  In fact when he walked him he started desaturating.  This is a new finding.  He had pulmonary function test today that shows significant decline in his DLCO.  This was done today and is captured below.  His most recent hemoglobin on 02/20/2022 was reviewed done at [facility].  Was 13.6 Dean% and normal.  We discussed the possible need of right heart catheterization.  When he was in the office he did not recall that he actually had a right heart catheterization.  After he left the office when I reviewed the records I noticed that in October 2023 he did have heart catheterization.  At that time he did have a right heart catheterization before the TAVR.  In this the pulmonary capillary wedge pressure was 11 mmHg,  pulmonary artery mean pressure was 27 mmHg and elevated, Fick cardiac index was 3.3 mL.  It was in this catheterization that they saw that he had severe aortic valve stenosis with aortic valve area of 1.05 cm and a valve gradient of 40 mmHg.  He did have follow-up echocardiogram on 02/08/2022.  His left ventricular ejection fraction is 55-60%.  Right ventricle is normal.  There is TAVR bioprosthetic valve.  No pericardial effusion.     Last Weight  Most recent update: 02/26/2022 10:35 AM    Weight  93.4 kg (205 lb 12.8 oz)              Modified Six Minute Walk - 02/26/22 1000     Type of O2 used  O2 3/L   POC pulsed oxygen   Number of laps completed  3    Lap Pace Moderate    Resting Heartrate 83 bpm    Final Heartrate 113 bpm    Resting Pulse Ox 94 %   room air. Patient feels he did not need his POC on at this time.   Desaturated to <= 3 points Yes    Desaturated to < 88% Yes    Distance walked when desaturation occurred  240 feet   patient completed one lap.   Became tachycardic Yes    Symptoms  SOB after one lap on room air.  Applied 3 lpm POC.  SaO2 back to 95%.  patient was able to complete remaining 2 laps - 3 laps total.    Was the O2 correction test done? Yes    Amount of O2 needed to correct destauration 3 L    Distance walked without destat below 88%  480 feet   patient walked remaining 2 laps on 3 lpm POC pulsed oxygen   comments patient desat after 1 lap to 88% room air.  POC oxygen applied  at 3 lpm pulsed.  patient completed remaining 2 laps SaO2 95%.            OV 04/08/2022  Subjective:  Patient ID: Dustin Dean, male , DOB: [birth year] , age 43 y.o. , MRN:  [id number] , ADDRESS: [address] [place] [address] PCP Dustin Farm, MD Patient Care Team: Dustin Farm, MD as PCP - General (Family Medicine)  This Provider for this visit: Treatment Team:  Attending Provider: Maire Scot, MD   02/26/2022 -   Chief Complaint  Patient presents  with   Follow-up    COVID end of December 2023.  Aortic valve replacement (TAVR)  01/02/22.  Cough persistent.  Runny nose improved after seeing ENT.    Latest Reference Range & Units 02/17/16 19:20 04/26/21 15:31 05/01/21 19:55 12/20/21 12:34 01/09/22 23:37  Eosinophils Absolute 0.0 - 0.5 K/uL 0.4 0.3 0.2 0.6 0.4    Anxiety and depression  -Seeing Suzzanne Estrin psychology  04/08/2022 -   Chief Complaint  Patient presents with   Follow-up    Pt is here for follow up. Pt states no adverse effects noted so far for the Esbriet .      HPI Dustin Dean 73 y.o. - returns for followup. Still on esbriet . Symotoms stable. Was suppsoed to have had RHC but on day of procedure DrSam Turner had multiple other patient emergencies. So it is rescheduled. Date pending.  Here to review PFT and CT Due to concern for progression. CT is definitel progressed from a year ago - despite esbriet .  PFT worse since a year ago but since covidd ? Rebounding back. We discuseed disease progression . Eplained next step is to try immune modulator. 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 We took shared decision to awaith RHC. Reommended he hold off clinical trial at this point. Cough still + but says is better with gabapentin. He does have high eos and we will discuss this next visit.  Obesity: losing weight  Wife also present and was independent historian     OV 05/22/2022  Subjective:  Patient ID: Dustin Dean, male , DOB: 01-Jan-1951 , age 65 y.o. , MRN: [id number] , AD[address]ESS: [address] [place] [address] PCP Dustin Farm, MD Patient Care Team: Dustin Farm, MD as PCP - General (Family Medicine)  This Provider for this visit: Treatment Team:  Attending Provider: Maire Scot, MD -   05/22/2022 -   Chief Complaint  Patient presents with   Follow-up    F/up on PFT      HPI Malick Netz 73 y.o. -returns for follow-up for his above issues.  Since his last visit he had his right heart catheterization.  It confirmed pulmonary hypertension present even after his TAVR procedure.  We ordered treprostinil.  He got the package last week and is going to start it tonight after the [employer] agent visits with him and sets up the package for him.  He is looking forward to starting it.  Currently tolerating pirfenidone  well.  He is going through cardiac rehab.  He states his stamina is improved his weight has come down.  His shortness of breath is better.  All reflected below.  Even the cough is better on Neurontin.  He is now seeing counselor Lamond Pilot and is feeling better.  He had pulmonary function test and it shows continued recent stability.  This is also described below.  He continues to use portable oxygen.  Recently went to [facility] and did some unloading and he did not need oxygen.  Today made him do a sit/stand test x 10 times.  He  did not desaturate below 93%.  OV 06/19/22  59-year-old male followed for interstitial lung disease with progressive phenotype (diagnosed in 2019) Seen for pulmonary consult to establish for ILD in January 2023.  Has chronic respiratory failure on oxygen History of prostate cancer.  History of aortic valve stenosis followed by cardiology at [facility] video visit is a 1 month follow-up for pulmonary hypertension related ILD patient says since last visit he is doing better.  Has completed pulmonary rehab.  And now is beginning a new exercise class at local gym.  Patient remains on Esbriet .  He says he has no issues.  Appetite is good with no nausea vomiting or diarrhea.  He was recently started on Tyvaso. Patient is doing well. Completing pulmonary rehab.  Going to a new exercise class Tolerating Tyvaso well.  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  Remains on oxygen  2 L with activity and at bedtime.  OV 08/21/2022  Subjective:  Patient ID: Dustin Dean, male , DOB: Apr 27, 1950 , age 53 y.o. , MRN: [id number] , AD[address]ESS: [address] [place] [address] PCP Dustin Farm, MD Patient Care Team: Dustin Farm, MD as PCP - General (Family Medicine)  This Provider for this visit: Treatment Team:  Attending Provider: Maire Scot, MD  08/21/2022 -   Chief Complaint  Patient presents with   Follow-up    Currently in hosp. wants to discuss treatment.    Type of visit: Video Virtual Visit Identification of patient Dustin Dean with 1951-01-09 and MRN [id number] - 2 person identifier Risks: Risks, benefits, limitations of telephone visit explained. Patient understood and verbalized agreement to proceed Anyone else on call: just patient Patient location: [facility] This provider location: [address]; [place]; [place] [address].  Pulmonary Office. [phone]     HPI Ladarien Beeks 73 y.o. -on this video visit as a follow-up video visit because he is now an inpatient at [facility] which is not part of [facility].  He converted the onsite visit to a video visit.  He told me 08/16/2022 and confirmed on review of the external medical records he got admitted with sudden desaturations.  He has been treated with steroids he was requiring 6 L.  He has somewhat improved.  He wants to get discharged but he says when they walked him with 3 L nasal cannula he desaturated. Labs at [facility] creat normal. CTA angio - negative for PE. Says RVP negaive. He feels he is getting better. He wants to go home. I d/w  over phone with Dr Cristy Doom of ccm there. Not on his rounding list but will review with primary/. Care everywhere does not indicate echo/bnp - indicated for this to be done + Consider HRCT + PCCM consult there with Dr Freeman Jersey Aug 2024   09/05/2022 Patient  presents today for hospital follow-up.  Patient was admitted for community-acquired pneumonia and discharged on 08/22/22. He originally presents to ED with low oxygen levels. CTA negative for PE, possible infiltrate.  He completed course of cefepime and doxycycline . He had a video visit with Dr. Bertrum Dean on 08/21/22 while admitted to [facility]. He was advised to stay on prednisone  until follow-up in clinic.He is oxygen dependent for hx chronic respiratory failure. He was discharged on 6L oxygen; he is currently using 2L oxygen at rest and 3L with exertion. Needs  large home oxygen concentrator. Continues Esbriet  and Tyvaso as directed. Patient had PFT on July 30th prior to admission. Echo/ bnp norm   OV 12/20/2022  Subjective:  Patient ID: Dustin Dean, male , DOB: 12-Apr-1950 , age 23 y.o. , MRN: [id number] , [address]: [address] [place] [address] PCP Dustin Farm, MD Patient Care Team: Dustin Farm, MD as PCP - General (Family Medicine)  This Provider for this visit: Treatment Team:  Attending Provider: Maire Scot, MD   12/20/2022 -   Chief Complaint  Patient presents with   Follow-up    Pft and ct f/u. Pt denies any concerns      HPI Dustin Dean 73 y.o. -returns for follow-up.  Presents with his wife who is an independent historian.  I personally saw him in May 2024 but since then much has happened.  He got admitted to hospital with pulmonary fibrosis flareup.  Since then he is on 4-5 L continuous.  Today turned his oxygen off he was 87% room air at rest.  Better soon as he moves he says he desaturates.  His lung function has significantly declined.  His FVC is down 16% since February 2024.  He had a CT scan of the chest just before Thanksgiving 2024 and our radiologist thought he is not worse compared to earlier this year but definitely worse compared to a year ago but his history and his lung function show clearly he is declining.  He stopped taking inhaled  treprostinil for his WHO group 3 pulmonary hypertension because of severe cough.  However he is tolerating his pirfenidone  well.  He lives with his wife.  There is a "young man" and his wife who are basically like surrogate children.  That is a social support.  He says at this point in time he is on this attending that his life expectancy is diminished.  We had a long conversation about goals of care.  Talked about therapeutic options of chronic prednisone , CellCept and transplant.  He is not interested in any of this.  This because of side effect profile.  He wanted know about his life expectancy.  I did tell him there is a 10 fold increase risk of death in 07/18/2023 because of his significant decline.  I did tell him that I will be very surprised if he is alive 2 years from now and modestly surprised if he was alive a year from now.  We talked about the concept of home palliative care.  We talked about the concept of hospice.  He has done a living will however he has not done a MOST form.  I did explain to him to consider himself no CPR and no intubation but accept BiPAP and simple medical treatment.  Also offered the concept of home palliative care at this point in time and if further decline this can be converted to hospice.  He is processing all this information.  His wife is in tears.  Patient himself states he is realistic.    Discussed medicare hospice benefit  - a medicare paid benefit  - for people with terminal qualifying  illness such as IPF, COPD, cancer with statistical prognosis < 6 months for which there is no cure - utilization of hospice shows people live longer paradoxically than those without hospice due to improved attention  - explained hospice locations - home, residential etc.,   - explained respite care options for caregivers  - explained that she could still get treatment for  non-hospice diagnosis and still come to office to see me for hospice related diagnosis that i provide  support for  - explained that hospice provides nursing, MD, chaplain, volunteer and medications and supplies paid through medicare      He has some urgent continence.  He wants to know if he can take Myrbetriq.  I told him that he could tell his urologist he could.     OV 03/21/2023  Subjective:  Patient ID: Dustin Dean, male , DOB: 14-Mar-1950 , age 78 y.o. , MRN: [id number] , ADDRESS: [address] [place] [address] PCP Dustin Farm, MD Patient Care Team: Dustin Farm, MD as PCP - General (Family Medicine)  This Provider for this visit: Treatment Team:  Attending Provider: Maire Scot, MD    03/21/2023 -   Chief Complaint  Patient presents with   Follow-up    Interstitial lung disease, wants to see have everything is going with lungs, had pft done today     HPI Dustin Dean 73 y.o. -returns for follow-up.  Presents with his wife.  He is saying that he is actually doing quite well.  And he is stable.  His energy levels are better.  He is tolerating pirfenidone  well.  His oxygen requirements are stable 5 L at rest and 5 L with exertion.  Occasionally if he overexerts he will go down to 87% on 5 L.  He has no major concerns.  We discussed clinical trials as a care option  alify for it.      OV 06/18/2023  Subjective:  Patient ID: Dustin Dean, male , DOB: 1950/08/04 , age 42 y.o. 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  -Progressive phenotype between August 2020 and October 2022   - started ofev mid feb 2023 -> early march 2023 admitted for ILD flare -> rechallenged ofev April 2023 -> ER vist SIRS   - SAE with OFev/  Ofev stopped  - started Pirfenioned May 2023 (rjected prednisone  due to side effec tprofile)  - transplaint discussion marrch 2-24 and December 2024- not interested -   -Not interested in transplant, chronic prednisone  and CellCept December 2024  9 mm right lower lobe nodule seen in October 2022 for the first time: Stable January 2023 repoted as fibrosis ->Not reported n feb 2024   8 mm lateral subpleural left lower lobe nodule (5/93),-> unchanged from 08/30/2017.-> through feb 2024  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  Respiratory exacerbations/infections  -Mid November 2023: Sinus congestion and given doxycycline  and prednisone   -01/09/2022: COVID-19 and given molnupiravir   E  -Summer 2024 admission  RAST allergy  panel December 2023 -Mildly positive for cat and dog dander.  Otherwise negative.  Normal IgE'. ELevaed Eos  High risk med  - Esbriet /Pirfenidone  requires intensive drug monitoring due to high concerns for Adverse effects of , including  Drug Induced Liver Injury, significant GI side effects that include but not limited to Diarrhea, Nausea, Vomiting,  and other system side effects that include Fatigue, headaches, weight loss and other side effects such as skin rash. These will be monitored with  blood work such as LFT initially once a month for 6 months and then quarterly    06/18/2023 -   Chief Complaint  Patient presents with   Interstitial Lung Disease     HPI Dustin Gahm 73 y.o. -presents  for follow-up.  He had a right heart catheterization repeat because he was intolerant to time also we wanted to see if he would qualify for recent study but his PVR was low less than 4 and therefore he did not qualify.  Currently he is on pirfenidone .  Overall he is stable.  See symptom score.  Excess hypoxemia continues without change.  He pulmonary function test and it is stable.  At this point in time we decided to take supportive care approach with continued pirfenidone .  In the future Nerandomilast might be approved and if he has progression or he wants to switch over we could try that.  Does not much appetite and in his situation for CellCept at this point.  Pulmonary function test also stable probably since last July.  Answered several questions he and his wife had.       SYMPTOM SCALE - ILD 01/24/2021 02/23/2021   04/05/2021 Post hopsilaization. On pred taper and off ofev Ofev 150 twice daily; No prednsone  05/19/2021 214# 11/17/2021  02/26/2022 205# 04/08/2022 199# 05/22/2022 199# - cardica reab 12/20/2022  03/21/2023  06/18/2023 Right heart cath done shows low PVR with normal wedge, mean PA 27  Current weight         esbiret esbriet  esbiret Esbietr - going to start tyvaso tonight Esbriet , sildenafil. OFF tyvaso Esbriet , sildenafil Pirfenidone  and sildenafil  O2 use 2L [place] with ex 2L with ex 2L [place] ex at hoe 2L          Shortness of Breath 0 -> 5 scale with 5 being worst (score 6 If unable to do)               At rest 0 0 0 1 0 0 0 0 0 - 0 0  Simple tasks - showers, clothes change, eating, shaving 1 1 0.5 2 1 1  0 0 0 2 0 1  Household (dishes, doing bed, laundry) 1 1 1 2 2 1 1 1 1 3 1 2   Shopping 2 1 0 NA 1 1 1 1 1 3 1 1   Walking level at own pace 5 2 0 1 (with oxygen) 1 2 2 1  0 3 2 2   Walking up Stairs 5 3 1  with o2, 6 without o2 2 (with oxygen) 2 3 3 2 2 4 4 4   Total (30-36) Dyspnea Score 14 8   8 7 8 7 5  with o2 4 with o2 15 8 10   How bad is your cough? 2 3 0 with gbapentin 1-2 (on gabapentin)  2 3 2 3 1 4 1 2   How bad is your fatigue 1 2 0 4 1 4 0 0 0 3  1 20  How bad is nausea 00000 0 0 0 0 0 0 0 0 0  0 0  How bad is vomiting?   0 0 0 0 0 0 0 0 0 0  0 0  How bad is diarrhea? 0 0 0 0 0 0 0 0 0 0  0 0  How bad is anxiety? 0 0 0 0 0 2 0 1 3 0  0 1  How bad is depression 0 0 0 0 (on antidepressant) 1 2 0 1 3 0 0 0  Any chronic pain - if so where and how bad 0 0 0 0 0  1 x x x 0 0     Simple office walk 185 feet x  3 laps goal with forehead probe 01/24/2021  04/05/2021 On prendisone. Off ofev 05/19/2021 Off ofev 09/08/2021 On esbiret since may 2023 02/26/2022 Esbiret since may 2023 04/06/22 05/22/2022 Full dose Esbriet .  Going to start treprostinil 12/20/2022 87-89% RA at rest  O2 used Ra at rest, desat at 1 lap and then walked with 2L piulse Ra - being on room air for 15 min and in mddile of pred burst taper   ra ra Room air   Number laps completed 1 lap on RA, then 3 laps on 2L Did all 3 laps   1 of 3 laps - forehead probe 3 of 3 laps Sit/stand x 10 times   Comments about pace x avg  avg av     Resting Pulse Ox/HR 96% and 88/min at 1 lap 98% an Hr 92 98% AND HR 81 98% and HR 77 94% 94% and HR 82 98 send and heart rate 81   Final Pulse Ox/HR 92% and 115/min on 3 laps on 2L pulsed 92% and HR 117 91% and HR 115 92% and HR 113 88% 88% and HR 109 93% and heart rate 102   Desaturated </= 88% no no no no      Desaturated <= 3% points yes Yes, 6  Yes 7  Yes 6 pots      Got Tachycardic >/= 90/min yes yes yes avg      Symptoms at end of test Modreate dyspnea Modate dyspnea Mild dyspnea Mild dyspnea   Level 2 out of 10 dyspnea   Miscellaneous comments Corrected with 2L [place] Did not need o2, improved              SIT STAND TEST - goal 15 times   06/18/2023    O2 used ra   PRobe - finter or forehead fnger   Number sit and stand completed - goal 15 15   Time taken to complete slow   Resting Pulse Ox/HR/Dyspnea  92% and 80/min and dyspnea of 0/10    Peak measures 85 % and 115/min and dyspnea of  6/10   Final Pulse Ox/HR 74% and 112/min and dyspnea of 6/10   Desaturated </= 88% yes   Desaturated <= 3% points yes   Got Tachycardic >/= 90/min ydes   Miscellaneous comments x         Latest Ref Rng & Units 06/13/2023    1:06 PM 03/21/2023   11:24 AM 12/18/2022    4:26 PM 08/14/2022   10:53 AM 05/22/2022    9:53 AM 04/04/2022    8:31 AM 02/26/2022    9:56 AM  PFT Results  FVC-Pre L 1.81  P 1.86  1.69  1.88  2.02  1.98  2.02   FVC-Predicted Pre % 44  P 45  41  45  48  51  52   FVC-Post L      2.18    FVC-Predicted Post %      57    Pre FEV1/FVC % % 89  P 92  89  86  90  89  88   Post FEV1/FCV % %      93    FEV1-Pre L 1.61  P 1.72  1.50  1.62  1.81  1.76  1.78   FEV1-Predicted Pre % 54  P 57  49  53  59  62  63   FEV1-Post L      2.03    DLCO uncorrected ml/min/mmHg   10.20  15.13  14.22  16.38  12.33   DLCO UNC% %   41  62  57  71  53   DLCO corrected ml/min/mmHg   10.20  15.13  14.38  16.38  12.62   DLCO COR %Predicted %   41  62  58  71  54   DLVA Predicted %   72  109  103  111  98   TLC L      3.57    TLC % Predicted %      57    RV % Predicted %      78      P Preliminary result       LAB RESULTS last 96 hours No results found.       has a past medical history of Aortic stenosis, Flu, Pneumonia, Prostate cancer (HCC), Pulmonary fibrosis (HCC), and Skin cancer.   reports that he has never smoked. He has been exposed to tobacco smoke. He has never used smokeless tobacco.  Past Surgical History:  Procedure Laterality Date   HERNIA REPAIR     NASAL SEPTUM SURGERY     RIGHT HEART CATH N/A 05/24/2023   Procedure: RIGHT HEART CATH;  Surgeon: Mardell Shade, MD;  Location: [facility];  Service: Cardiovascular;  Laterality: N/A;   SKIN CANCER EXCISION      Allergies  Allergen Reactions   Bee Venom Rash   Shellfish Allergy  Hives   Nintedanib Other (See Comments)    SIRS, fevers   Pseudoephedrine Anxiety    Immunization History  Administered  Date(s) Administered   Fluad Quad(high Dose 65+) 10/23/2017, 09/30/2019, 10/14/2020, 10/18/2020   Influenza-Unspecified 11/11/2009, 11/08/2011, 11/03/2013, 11/22/2014, 11/10/2015   Moderna Covid-19 Fall Seasonal Vaccine 52yrs & older 10/05/2022   Moderna Covid-19 Vaccine Bivalent Booster 72yrs & up 10/24/2020   Moderna Sars-Covid-2 Vaccination 03/19/2019, 04/16/2019, 11/16/2019, 05/07/2020   Pfizer(Comirnaty)Fall Seasonal Vaccine 12 years and older 10/19/2021   Pneumococcal Conjugate-13 12/08/2007, 12/21/2015, 09/04/2018   Pneumococcal Polysaccharide-23 12/08/2007, 06/21/2017, 06/16/2018   Tdap 12/08/2007, 11/27/2018   Zoster Recombinant(Shingrix) 10/30/2013, 09/04/2018, 11/27/2018   Zoster, Live 10/30/2013   Zoster, Unspecified 10/30/2013    Family History  Problem Relation Age of Onset   Cancer Mother    Congestive Heart Failure Father    Prostate cancer Father    Healthy Sister      Current Outpatient Medications:    acetaminophen  (TYLENOL ) 500 MG tablet, Take 500 mg by mouth every 8 (eight) hours as needed for moderate pain (pain score 4-6)., Disp: , Rfl:    aspirin EC 81 MG tablet, Take 81 mg by mouth daily. Swallow whole., Disp: , Rfl:    Calcium Carbonate-Vitamin D (CALCIUM 600-VITAMIN D3 PO), Take 1 tablet by mouth daily., Disp: ,  Rfl:    cetirizine (ZYRTEC) 10 MG tablet, Take 10 mg by mouth daily., Disp: , Rfl:    ESBRIET  801 MG TABS, Take 1 tablet (801 mg total) by mouth 3 (three) times daily with meals., Disp: 270 tablet, Rfl: 2   fluorouracil (EFUDEX) 5 % cream, Apply 1 Application topically 2 (two) times daily as needed (irritation)., Disp: , Rfl:    fluticasone (FLONASE) 50 MCG/ACT nasal spray, Place 2 sprays into both nostrils daily., Disp: , Rfl:    gabapentin (NEURONTIN) 300 MG capsule, Take 900 mg by mouth 3 (three) times daily., Disp: , Rfl:    ipratropium (ATROVENT) 0.06 % nasal spray, Place 2 sprays into both nostrils 3 (three) times daily., Disp: , Rfl:     loperamide (IMODIUM) 2 MG capsule, Take 1 mg by mouth daily., Disp: , Rfl:    meclizine (ANTIVERT) 25 MG tablet, Take 25 mg by mouth 2 (two) times daily as needed for dizziness., Disp: , Rfl:    Misc Natural Products (ELDERBERRY IMMUNE COMPLEX PO), Take 2 tablets by mouth daily., Disp: , Rfl:    Multiple Vitamin (MULTIVITAMIN) tablet, Take 1 tablet by mouth daily., Disp: , Rfl:    omeprazole (PRILOSEC) 40 MG capsule, Take 40 mg by mouth daily., Disp: , Rfl:    PARoxetine (PAXIL) 20 MG tablet, Take 20 mg by mouth daily., Disp: , Rfl:    rosuvastatin (CRESTOR) 5 MG tablet, Take 5 mg by mouth daily., Disp: , Rfl:    sildenafil (REVATIO) 20 MG tablet, Take 20 mg by mouth 3 (three) times daily., Disp: , Rfl:    SUMAtriptan (IMITREX) 100 MG tablet, Take 100 mg by mouth every 2 (two) hours as needed for migraine., Disp: , Rfl:    tamsulosin (FLOMAX) 0.4 MG CAPS capsule, Take 0.4 mg by mouth at bedtime., Disp: , Rfl:       Objective:   Vitals:   06/18/23 1400  Pulse: 80  Weight: 206 lb (93.4 kg)  Height: 5\' 8"  (1.727 m)    Estimated body mass index is 31.32 kg/m as calculated from the following:   Height as of this encounter: 5\' 8"  (1.727 m).   Weight as of this encounter: 206 lb (93.4 kg).  @WEIGHTCHANGE @  [employer]   06/18/23 1400  Weight: 206 lb (93.4 kg)     Physical Exam   General: No distress. Looks well O2 at rest: [employer] present: no Sitting in wheel chair: no Frail: no Obese: yes Neuro: Alert and Oriented x 3. GCS 15. Speech normal Psych: Pleasant Resp:  Barrel Chest - 1no.  Wheeze - no, Crackles - DIFFUSEn, No overt respiratory distress CVS: Normal heart sounds. Murmurs - no Ext: Stigmata of Connective Tissue Disease - no HEENT: Normal upper airway. PEERL +. No post nasal drip        Assessment:       ICD-10-CM   1. ILD (interstitial lung disease) (HCC)  J84.9 Pulmonary function test    2. Chronic respiratory failure with hypoxia (HCC)  J96.11  Pulmonary function test    3. 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  Plan (shared decision making)  --Continue pirfenidone  801mg  x 3 times daily  - take with food  - space 5-6 hours apart  - apply sunscreen  - wear hat  - hydrate well   - do LFT  06/18/2023   - sidelanfil might not be beneifical for your type of pulmonary hypertension but ok to continue for now  - discussed transplant, chronic prednisone  and cellcept  - respect refusal  -  DO NOT Qualify or PHOCUS study with MOSLICIGUAT v Placebo  DUE TO LOW PVR    Follow-up  - 12-14  weeks 30-minute visit with Dr. Bertrum Dean but after spirometry and DLCO.    FOLLOWUP Return in about 14 weeks (around 09/24/2023) for with Dr Dustin Dean, after Spiro and DLCO, Face to Face Visit.    SIGNATURE    Dr. Maire Dean, M.D., F.C.C.P,  Pulmonary and Critical Care Medicine Staff Physician, [facility] Director - Interstitial Lung Disease  Program  Pulmonary Fibrosis [facility] at [facility] [place], [place], [address]  Pager: [phone], If no answer or between  15:00h - 7:00h: call [phone] Telephone: [phone]  2:52 PM 06/18/2023

## 2023-06-25 ENCOUNTER — Ambulatory Visit (INDEPENDENT_AMBULATORY_CARE_PROVIDER_SITE_OTHER): Admitting: Behavioral Health

## 2023-06-25 ENCOUNTER — Encounter: Payer: Self-pay | Admitting: Behavioral Health

## 2023-06-25 DIAGNOSIS — F331 Major depressive disorder, recurrent, moderate: Secondary | ICD-10-CM

## 2023-06-25 DIAGNOSIS — R454 Irritability and anger: Secondary | ICD-10-CM | POA: Diagnosis not present

## 2023-06-25 NOTE — Progress Notes (Signed)
 [facility] Counselor Initial Adult Exam  Name: Dustin Dean Date: 06/25/2023 MRN: [id number] DOB: February 17, 1950 PCP: Tarri Farm, MD  Time spent: 57 minutes, 3 PM until 3:57 PM spent in person with the patient in the outpatient therapist office. The patient reports an increase in irritability lately but he reports that he is more aware of that irritability.  He says at times he has to think back and see how those events or situations or people stack on top of each other in terms of building irritability.  He is very mindful of noticing how he experiences that but is also very mindful and how he responds to that.  He recognizes that his growth and significant process in limiting his response to those situations/environments that raise his irritability level.  He also has reached out to many of the people that he said he wanted to write letters to and has gone responses from almost all of them that he reached out to.  He said conversations with what he said was his favorite boss and that was pleasant and they will talk again.  He also has a promise from another 1 speak in the very near future and he is getting much joy from that.  He is going to the beach with his neighbors for a few days next week and also again in September and is looking forward to that.  He does contract for safety having no thoughts of hurting himself or anyone else.   Guardian/Payee: Self  Paperwork requested: Yes   Reason for Visit /Presenting Problem: Depression  . He does contract for safety having no thoughts of hurting himself or anyone else. Mental Status Exam: Appearance:   Fairly Groomed     Behavior:  Appropriate  Motor:  Normal  Speech/Language:   Clear and Coherent and Normal Rate  Affect:  Appropriate  Mood:  normal  Thought process:  normal  Thought content:    WNL  Sensory/Perceptual disturbances:    WNL  Orientation:  oriented to person, place, time/date, situation, and day of week   Attention:  Good  Concentration:  Good  Memory:  WNL  Fund of knowledge:   Good  Insight:    Good  Judgment:   Good  Impulse Control:  Good     Reported Symptoms: Depression  Risk Assessment: Danger to Self:  No Self-injurious Behavior: No Danger to Others: No Duty to Warn:no Physical Aggression / Violence:No  Access to Firearms a concern: No  Gang Involvement:No  Patient / guardian was educated about steps to take if suicide or homicide risk level increases between visits: n/a While future psychiatric events cannot be accurately predicted, the patient does not currently require acute inpatient psychiatric care and does not currently meet [facility]  involuntary commitment criteria.  Substance Abuse History: Current substance abuse: No     Past Psychiatric History:   Previous psychological history is significant for depression Outpatient Providers: Primary care physician History of Psych Hospitalization: None reported Psychological Testing: n/a   Abuse History:  Victim of: No., None reported   Report needed: No. Victim of Neglect:No. Perpetrator of n/a Witness / Exposure to Domestic Violence: None reported  Protective Services Involvement: No  Witness to [employer] Violence:  No   Family History:  Family History  Problem Relation Age of Onset   Cancer Mother    Congestive Heart Failure Father    Prostate cancer Father    Healthy Sister     Living situation: the patient  lives with their spouse  Sexual Orientation: Straight  Relationship Status: married  Name of spouse / other: Did not discuss If a parent, number of children / ages: Adults  Support Systems: spouse  Financial Stress:  No   Income/Employment/Disability: Doctor, general practice: No   Educational History: Education: high school diploma/GED  Religion/Sprituality/World View: Did not discuss  Any cultural differences that may affect / interfere with treatment:   not applicable   Recreation/Hobbies: Time with family  Stressors: Health problems    Strengths: Supportive Relationships, Hopefulness, Self Advocate, and Able to Communicate Effectively  Barriers:     Legal History: Pending legal issue / charges: The patient has no significant history of legal issues. History of legal issue / charges: Not applicable  Medical History/Surgical History: reviewed Past Medical History:  Diagnosis Date   Aortic stenosis    Flu    Pneumonia    Prostate cancer (HCC)    Pulmonary fibrosis (HCC)    Skin cancer     Past Surgical History:  Procedure Laterality Date   HERNIA REPAIR     NASAL SEPTUM SURGERY     RIGHT HEART CATH N/A 05/24/2023   Procedure: RIGHT HEART CATH;  Surgeon: Mardell Shade, MD;  Location: [facility];  Service: Cardiovascular;  Laterality: N/A;   SKIN CANCER EXCISION      Medications: Current Outpatient Medications  Medication Sig Dispense Refill   acetaminophen  (TYLENOL ) 500 MG tablet Take 500 mg by mouth every 8 (eight) hours as needed for moderate pain (pain score 4-6).     aspirin EC 81 MG tablet Take 81 mg by mouth daily. Swallow whole.     Calcium Carbonate-Vitamin D (CALCIUM 600-VITAMIN D3 PO) Take 1 tablet by mouth daily.     cetirizine (ZYRTEC) 10 MG tablet Take 10 mg by mouth daily.     ESBRIET  801 MG TABS Take 1 tablet (801 mg total) by mouth 3 (three) times daily with meals. 270 tablet 2   fluorouracil (EFUDEX) 5 % cream Apply 1 Application topically 2 (two) times daily as needed (irritation).     fluticasone (FLONASE) 50 MCG/ACT nasal spray Place 2 sprays into both nostrils daily.     gabapentin (NEURONTIN) 300 MG capsule Take 900 mg by mouth 3 (three) times daily.     ipratropium (ATROVENT) 0.06 % nasal spray Place 2 sprays into both nostrils 3 (three) times daily.     loperamide (IMODIUM) 2 MG capsule Take 1 mg by mouth daily.     meclizine (ANTIVERT) 25 MG tablet Take 25 mg by mouth 2 (two) times  daily as needed for dizziness.     Misc Natural Products (ELDERBERRY IMMUNE COMPLEX PO) Take 2 tablets by mouth daily.     Multiple Vitamin (MULTIVITAMIN) tablet Take 1 tablet by mouth daily.     omeprazole (PRILOSEC) 40 MG capsule Take 40 mg by mouth daily.     PARoxetine (PAXIL) 20 MG tablet Take 20 mg by mouth daily.     rosuvastatin (CRESTOR) 5 MG tablet Take 5 mg by mouth daily.     sildenafil (REVATIO) 20 MG tablet Take 20 mg by mouth 3 (three) times daily.     SUMAtriptan (IMITREX) 100 MG tablet Take 100 mg by mouth every 2 (two) hours as needed for migraine.     tamsulosin (FLOMAX) 0.4 MG CAPS capsule Take 0.4 mg by mouth at bedtime.     No current facility-administered medications for this visit.  Allergies  Allergen Reactions   Bee Venom Rash   Shellfish Allergy  Hives   Nintedanib Other (See Comments)    SIRS, fevers   Pseudoephedrine Anxiety    Diagnoses:  Major depressive disorder, recurrent, moderate, irritability Plan of Care: I will meet with the patient every 2 to 3 weeks in person Treatment plan: We will use cognitive behavioral therapy, even though some therapy as well as elements of dialectical behavior therapy to help the patient reduce his anxiety by at least 50% with a target date of  October 30th 2024.  Goals will be to help the patient better manage anxiety symptoms and stress, identify causes for anxiety including changes in his health and ways to lower the anxiety, resolve the conflicts contributing to anxiety and help him manage thoughts and worrisome thinking contributing to his anxiety.  Interventions include providing education about anxiety to help him understand its causes symptoms and triggers..  We will facilitate problem solution skills as well as coping skills to help him manage anxiety and stress symptoms.  We will use cognitive behavioral therapy to identify and change anxiety provoking thought and behavior patterns as well as dialectical therapy to  teach distress tolerance and mindfulness skills. Progress: 35% We will continue with treatment goals as listed above with a new target date of November 15, 2023                                  Cecile Coder, [facility]                  Cecile Coder, [facility]               Cecile Coder, [facility]               Cecile Coder, [facility]               Cecile Coder, [facility]               Cecile Coder, [facility]               Cecile Coder, [facility]               Cecile Coder, [facility]               Cecile Coder, [facility]               Cecile Coder, [facility]               Cecile Coder, [facility]               Cecile Coder, [facility]               Cecile Coder, [facility]               Cecile Coder, [facility] Counselor Initial Adult Exam  Name: Dustin Dean Date: 06/25/2023 MRN: [id number] DOB: [birth year] PCP: Tarri Farm, MD  Time spent:     spent in person with the patient in the outpatient therapist office.  He does contract for safety having no thoughts of hurting himself or anyone else.   Guardian/Payee: Self  Paperwork requested: Yes   Reason for Visit /Presenting Problem: Depression  . He does contract for safety having no thoughts of hurting himself or anyone else. Mental Status Exam: Appearance:   Fairly Groomed  Behavior:  Appropriate  Motor:  Normal  Speech/Language:   Clear and Coherent and Normal Rate  Affect:  Appropriate  Mood:  normal  Thought process:  normal  Thought content:    WNL  Sensory/Perceptual disturbances:    WNL  Orientation:  oriented to person, place, time/date, situation, and day of week  Attention:  Good  Concentration:  Good  Memory:  WNL  Fund of knowledge:   Good  Insight:    Good  Judgment:   Good  Impulse Control:   Good     Reported Symptoms: Depression  Risk Assessment: Danger to Self:  No Self-injurious Behavior: No Danger to Others: No Duty to Warn:no Physical Aggression / Violence:No  Access to Firearms a concern: No  Gang Involvement:No  Patient / guardian was educated about steps to take if suicide or homicide risk level increases between visits: n/a While future psychiatric events cannot be accurately predicted, the patient does not currently require acute inpatient psychiatric care and does not currently meet   involuntary commitment criteria.  Substance Abuse History: Current substance abuse: No     Past Psychiatric History:   Previous psychological history is significant for depression Outpatient Providers: Primary care physician History of Psych Hospitalization: None reported Psychological Testing: n/a   Abuse History:  Victim of: No., None reported   Report needed: No. Victim of Neglect:No. Perpetrator of n/a Witness / Exposure to Domestic Violence: None reported  Protective Services Involvement: No  Witness to [employer] Violence:  No   Family History:  Family History  Problem Relation Age of Onset   Cancer Mother    Congestive Heart Failure Father    Prostate cancer Father    Healthy Sister     Living situation: the patient lives with their spouse  Sexual Orientation: Straight  Relationship Status: married  Name of spouse / other: Did not discuss If a parent, number of children / ages: Adults  Support Systems: spouse  Financial Stress:  No   Income/Employment/Disability: Doctor, general practice: No   Educational History: Education: high school diploma/GED  Religion/Sprituality/World View: Did not discuss  Any cultural differences that may affect / interfere with treatment:  not applicable   Recreation/Hobbies: Time with family  Stressors: Health problems    Strengths: Supportive Relationships, Hopefulness,  Self Advocate, and Able to Communicate Effectively  Barriers:     Legal History: Pending legal issue / charges: The patient has no significant history of legal issues. History of legal issue / charges: Not applicable  Medical History/Surgical History: reviewed Past Medical History:  Diagnosis Date   Aortic stenosis    Flu    Pneumonia    Prostate cancer (HCC)    Pulmonary fibrosis (HCC)    Skin cancer     Past Surgical History:  Procedure Laterality Date   HERNIA REPAIR     NASAL SEPTUM SURGERY     RIGHT HEART CATH N/A 05/24/2023   Procedure: RIGHT HEART CATH;  Surgeon: Mardell Shade, MD;  Location: [facility];  Service: Cardiovascular;  Laterality: N/A;   SKIN CANCER EXCISION      Medications: Current Outpatient Medications  Medication Sig Dispense Refill   acetaminophen  (TYLENOL ) 500 MG tablet Take 500 mg by mouth every 8 (eight) hours as needed for moderate pain (pain score 4-6).     aspirin EC 81 MG tablet Take 81 mg by mouth daily. Swallow whole.     Calcium Carbonate-Vitamin D (CALCIUM 600-VITAMIN D3 PO)  Take 1 tablet by mouth daily.     cetirizine (ZYRTEC) 10 MG tablet Take 10 mg by mouth daily.     ESBRIET  801 MG TABS Take 1 tablet (801 mg total) by mouth 3 (three) times daily with meals. 270 tablet 2   fluorouracil (EFUDEX) 5 % cream Apply 1 Application topically 2 (two) times daily as needed (irritation).     fluticasone (FLONASE) 50 MCG/ACT nasal spray Place 2 sprays into both nostrils daily.     gabapentin (NEURONTIN) 300 MG capsule Take 900 mg by mouth 3 (three) times daily.     ipratropium (ATROVENT) 0.06 % nasal spray Place 2 sprays into both nostrils 3 (three) times daily.     loperamide (IMODIUM) 2 MG capsule Take 1 mg by mouth daily.     meclizine (ANTIVERT) 25 MG tablet Take 25 mg by mouth 2 (two) times daily as needed for dizziness.     Misc Natural Products (ELDERBERRY IMMUNE COMPLEX PO) Take 2 tablets by mouth daily.     Multiple Vitamin  (MULTIVITAMIN) tablet Take 1 tablet by mouth daily.     omeprazole (PRILOSEC) 40 MG capsule Take 40 mg by mouth daily.     PARoxetine (PAXIL) 20 MG tablet Take 20 mg by mouth daily.     rosuvastatin (CRESTOR) 5 MG tablet Take 5 mg by mouth daily.     sildenafil (REVATIO) 20 MG tablet Take 20 mg by mouth 3 (three) times daily.     SUMAtriptan (IMITREX) 100 MG tablet Take 100 mg by mouth every 2 (two) hours as needed for migraine.     tamsulosin (FLOMAX) 0.4 MG CAPS capsule Take 0.4 mg by mouth at bedtime.     No current facility-administered medications for this visit.    Allergies  Allergen Reactions   Bee Venom Rash   Shellfish Allergy  Hives   Nintedanib Other (See Comments)    SIRS, fevers   Pseudoephedrine Anxiety    Diagnoses:  Major depressive disorder, recurrent, moderate, irritability Plan of Care: I will meet with the patient every 2 to 3 weeks in person Treatment plan: We will use cognitive behavioral therapy, even though some therapy as well as elements of dialectical behavior therapy to help the patient reduce his anxiety by at least 50% with a target date of  October 30th 2024.  Goals will be to help the patient better manage anxiety symptoms and stress, identify causes for anxiety including changes in his health and ways to lower the anxiety, resolve the conflicts contributing to anxiety and help him manage thoughts and worrisome thinking contributing to his anxiety.  Interventions include providing education about anxiety to help him understand its causes symptoms and triggers..  We will facilitate problem solution skills as well as coping skills to help him manage anxiety and stress symptoms.  We will use cognitive behavioral therapy to identify and change anxiety provoking thought and behavior patterns as well as dialectical therapy to teach distress tolerance and mindfulness skills. Progress: 35% We will continue with treatment goals as listed above with a new target date of  October 31st 2025                                  Cecile Coder, [facility]                  Cecile Coder, [facility]  Cecile Coder, [facility]               Cecile Coder, [facility]               Cecile Coder, [facility]               Cecile Coder, [facility]               Cecile Coder, [facility]               Cecile Coder, [facility]               Cecile Coder, [facility]               Cecile Coder, [facility]               Cecile Coder, [facility]               Cecile Coder, [facility]               Cecile Coder, [facility]               Cecile Coder, [facility]               Cecile Coder, [facility]               Cecile Coder, [facility]               Cecile Coder, [facility]               Cecile Coder, [facility]

## 2023-07-11 ENCOUNTER — Encounter: Payer: Self-pay | Admitting: Internal Medicine

## 2023-07-12 ENCOUNTER — Telehealth: Payer: Self-pay

## 2023-07-12 NOTE — Telephone Encounter (Signed)
 Spoke with patient regarding prior message . Patient stated he is feeling so much better today . Advised patient next time if he feels bad to call our office and speak to a triage nurse . Patient's voice was understanding nothing else further needed.

## 2023-07-16 ENCOUNTER — Encounter: Payer: Self-pay | Admitting: Internal Medicine

## 2023-07-16 ENCOUNTER — Encounter: Payer: Self-pay | Admitting: Behavioral Health

## 2023-07-16 ENCOUNTER — Ambulatory Visit (INDEPENDENT_AMBULATORY_CARE_PROVIDER_SITE_OTHER): Admitting: Behavioral Health

## 2023-07-16 DIAGNOSIS — F411 Generalized anxiety disorder: Secondary | ICD-10-CM

## 2023-07-16 NOTE — Progress Notes (Signed)
 [facility] Counselor Initial Adult Exam  Name: Dustin Dean Date: 07/16/2023 MRN: [id number] DOB: Oct 23, 1950 PCP: Willian Righter, MD  Time spent: 11 AM until 11:45 AM, 45 minutes spent in person with the patient in the outpatient therapist office. The patient got sick last week to the point his oxygen levels were concerning him so he went to the emergency room.  They ruled out anything significant including the flu COVID-pneumonia but did say that because of the fibrosis in his lungs it was hard to see his lungs clearly.  He is feeling somewhat better today but was still having difficulty with breathing at times even with his oxygen.  He says he is starting to believe that he may be at a turning point in the progression of his pulmonary fibrosis.  He is finding difficulty with moving around and breathing more difficult.  He has already had time to prepare or think about what these changes look like but he says it is now starting to feel more real.  We talked about questions that he is thinking about that he is feeling in relation to his faith and his wife's family.  His faith is very important to him and he is secure in that.  Acknowledges a sadness in dealing with the medical changes that are taking place and the sadness and thinking about when the time comes leaving his wife behind if he does die first.  His daughter who is a special part of the family is also in very poor health and he does not want his wife to go due to losses and losing his dog will be difficult for him also.  We processed grief feelings and talked about difficult questions. He does contract for safety having no thoughts of hurting himself or anyone else.   Guardian/Payee: Self  Paperwork requested: Yes   Reason for Visit /Presenting Problem: Depression  . He does contract for safety having no thoughts of hurting himself or anyone else. Mental Status Exam: Appearance:   Fairly Groomed     Behavior:   Appropriate  Motor:  Normal  Speech/Language:   Clear and Coherent and Normal Rate  Affect:  Appropriate  Mood:  normal  Thought process:  normal  Thought content:    WNL  Sensory/Perceptual disturbances:    WNL  Orientation:  oriented to person, place, time/date, situation, and day of week  Attention:  Good  Concentration:  Good  Memory:  WNL  Fund of knowledge:   Good  Insight:    Good  Judgment:   Good  Impulse Control:  Good     Reported Symptoms: Depression  Risk Assessment: Danger to Self:  No Self-injurious Behavior: No Danger to Others: No Duty to Warn:no Physical Aggression / Violence:No  Access to Firearms a concern: No  Gang Involvement:No  Patient / guardian was educated about steps to take if suicide or homicide risk level increases between visits: n/a While future psychiatric events cannot be accurately predicted, the patient does not currently require acute inpatient psychiatric care and does not currently meet [facility]  involuntary commitment criteria.  Substance Abuse History: Current substance abuse: No     Past Psychiatric History:   Previous psychological history is significant for depression Outpatient Providers: Primary care physician History of Psych Hospitalization: None reported Psychological Testing: n/a   Abuse History:  Victim of: No., None reported   Report needed: No. Victim of Neglect:No. Perpetrator of n/a Witness / Exposure to Domestic Violence:  None reported  Protective Services Involvement: No  Witness to [employer] Violence:  No   Family History:  Family History  Problem Relation Age of Onset   Cancer Mother    Congestive Heart Failure Father    Prostate cancer Father    Healthy Sister     Living situation: the patient lives with their spouse  Sexual Orientation: Straight  Relationship Status: married  Name of spouse / other: Did not discuss If a parent, number of children / ages: Adults  Support Systems:  spouse  Financial Stress:  No   Income/Employment/Disability: Doctor, general practice: No   Educational History: Education: high school diploma/GED  Religion/Sprituality/World View: Did not discuss  Any cultural differences that may affect / interfere with treatment:  not applicable   Recreation/Hobbies: Time with family  Stressors: Health problems    Strengths: Supportive Relationships, Hopefulness, Self Advocate, and Able to Communicate Effectively  Barriers:     Legal History: Pending legal issue / charges: The patient has no significant history of legal issues. History of legal issue / charges: Not applicable  Medical History/Surgical History: reviewed Past Medical History:  Diagnosis Date   Aortic stenosis    Flu    Pneumonia    Prostate cancer (HCC)    Pulmonary fibrosis (HCC)    Skin cancer     Past Surgical History:  Procedure Laterality Date   HERNIA REPAIR     NASAL SEPTUM SURGERY     RIGHT HEART CATH N/A 05/24/2023   Procedure: RIGHT HEART CATH;  Surgeon: Cherrie Toribio SAUNDERS, MD;  Location: [facility];  Service: Cardiovascular;  Laterality: N/A;   SKIN CANCER EXCISION      Medications: Current Outpatient Medications  Medication Sig Dispense Refill   acetaminophen  (TYLENOL ) 500 MG tablet Take 500 mg by mouth every 8 (eight) hours as needed for moderate pain (pain score 4-6).     aspirin EC 81 MG tablet Take 81 mg by mouth daily. Swallow whole.     Calcium Carbonate-Vitamin D (CALCIUM 600-VITAMIN D3 PO) Take 1 tablet by mouth daily.     cetirizine (ZYRTEC) 10 MG tablet Take 10 mg by mouth daily.     ESBRIET  801 MG TABS Take 1 tablet (801 mg total) by mouth 3 (three) times daily with meals. 270 tablet 2   fluorouracil (EFUDEX) 5 % cream Apply 1 Application topically 2 (two) times daily as needed (irritation).     fluticasone (FLONASE) 50 MCG/ACT nasal spray Place 2 sprays into both nostrils daily.     gabapentin (NEURONTIN)  300 MG capsule Take 900 mg by mouth 3 (three) times daily.     ipratropium (ATROVENT) 0.06 % nasal spray Place 2 sprays into both nostrils 3 (three) times daily.     loperamide (IMODIUM) 2 MG capsule Take 1 mg by mouth daily.     meclizine (ANTIVERT) 25 MG tablet Take 25 mg by mouth 2 (two) times daily as needed for dizziness.     Misc Natural Products (ELDERBERRY IMMUNE COMPLEX PO) Take 2 tablets by mouth daily.     Multiple Vitamin (MULTIVITAMIN) tablet Take 1 tablet by mouth daily.     omeprazole (PRILOSEC) 40 MG capsule Take 40 mg by mouth daily.     PARoxetine (PAXIL) 20 MG tablet Take 20 mg by mouth daily.     rosuvastatin (CRESTOR) 5 MG tablet Take 5 mg by mouth daily.     sildenafil (REVATIO) 20 MG tablet Take 20 mg  by mouth 3 (three) times daily.     SUMAtriptan (IMITREX) 100 MG tablet Take 100 mg by mouth every 2 (two) hours as needed for migraine.     tamsulosin (FLOMAX) 0.4 MG CAPS capsule Take 0.4 mg by mouth at bedtime.     No current facility-administered medications for this visit.    Allergies  Allergen Reactions   Bee Venom Rash   Shellfish Allergy  Hives   Nintedanib Other (See Comments)    SIRS, fevers   Pseudoephedrine Anxiety    Diagnoses:  Major depressive disorder, recurrent, moderate, irritability Plan of Care: I will meet with the patient every 2 to 3 weeks in person Treatment plan: We will use cognitive behavioral therapy, even though some therapy as well as elements of dialectical behavior therapy to help the patient reduce his anxiety by at least 50% with a target date of  October 30th 2024.  Goals will be to help the patient better manage anxiety symptoms and stress, identify causes for anxiety including changes in his health and ways to lower the anxiety, resolve the conflicts contributing to anxiety and help him manage thoughts and worrisome thinking contributing to his anxiety.  Interventions include providing education about anxiety to help him understand  its causes symptoms and triggers..  We will facilitate problem solution skills as well as coping skills to help him manage anxiety and stress symptoms.  We will use cognitive behavioral therapy to identify and change anxiety provoking thought and behavior patterns as well as dialectical therapy to teach distress tolerance and mindfulness skills. Progress: 35% We will continue with treatment goals as listed above with a new target date of November 15, 2023                                  Lorrene CHRISTELLA Hasten, [facility]                  Lorrene CHRISTELLA Hasten, [facility]               Lorrene CHRISTELLA Hasten, [facility]               Lorrene CHRISTELLA Hasten, [facility]               Lorrene CHRISTELLA Hasten, [facility]               Lorrene CHRISTELLA Hasten, [facility]               Lorrene CHRISTELLA Hasten, [facility]               Lorrene CHRISTELLA Hasten, [facility]               Lorrene CHRISTELLA Hasten, [facility]               Lorrene CHRISTELLA Hasten, [facility]               Lorrene CHRISTELLA Hasten, [facility]               Lorrene CHRISTELLA Hasten, [facility]               Lorrene CHRISTELLA Hasten, [facility]               Lorrene CHRISTELLA Hasten, [facility] Counselor Initial Adult Exam  Name: Santino Kinsella Date: 07/16/2023 MRN: [id number] DOB: [birth year] PCP: Willian Righter, MD  Time spent:     spent in person with the patient in the outpatient therapist  office.  He does contract for safety having no thoughts of hurting himself or anyone else.   Guardian/Payee: Self  Paperwork requested: Yes   Reason for Visit /Presenting Problem: Depression  . He does contract for safety having no thoughts of hurting himself or anyone else. Mental Status Exam: Appearance:   Fairly Groomed     Behavior:  Appropriate  Motor:  Normal  Speech/Language:   Clear and Coherent and Normal Rate  Affect:  Appropriate  Mood:  normal  Thought process:   normal  Thought content:    WNL  Sensory/Perceptual disturbances:    WNL  Orientation:  oriented to person, place, time/date, situation, and day of week  Attention:  Good  Concentration:  Good  Memory:  WNL  Fund of knowledge:   Good  Insight:    Good  Judgment:   Good  Impulse Control:  Good     Reported Symptoms: Depression  Risk Assessment: Danger to Self:  No Self-injurious Behavior: No Danger to Others: No Duty to Warn:no Physical Aggression / Violence:No  Access to Firearms a concern: No  Gang Involvement:No  Patient / guardian was educated about steps to take if suicide or homicide risk level increases between visits: n/a While future psychiatric events cannot be accurately predicted, the patient does not currently require acute inpatient psychiatric care and does not currently meet   involuntary commitment criteria.  Substance Abuse History: Current substance abuse: No     Past Psychiatric History:   Previous psychological history is significant for depression Outpatient Providers: Primary care physician History of Psych Hospitalization: None reported Psychological Testing: n/a   Abuse History:  Victim of: No., None reported   Report needed: No. Victim of Neglect:No. Perpetrator of n/a Witness / Exposure to Domestic Violence: None reported  Protective Services Involvement: No  Witness to [employer] Violence:  No   Family History:  Family History  Problem Relation Age of Onset   Cancer Mother    Congestive Heart Failure Father    Prostate cancer Father    Healthy Sister     Living situation: the patient lives with their spouse  Sexual Orientation: Straight  Relationship Status: married  Name of spouse / other: Did not discuss If a parent, number of children / ages: Adults  Support Systems: spouse  Financial Stress:  No   Income/Employment/Disability: Doctor, general practice: No   Educational  History: Education: high school diploma/GED  Religion/Sprituality/World View: Did not discuss  Any cultural differences that may affect / interfere with treatment:  not applicable   Recreation/Hobbies: Time with family  Stressors: Health problems    Strengths: Supportive Relationships, Hopefulness, Self Advocate, and Able to Communicate Effectively  Barriers:     Legal History: Pending legal issue / charges: The patient has no significant history of legal issues. History of legal issue / charges: Not applicable  Medical History/Surgical History: reviewed Past Medical History:  Diagnosis Date   Aortic stenosis    Flu    Pneumonia    Prostate cancer (HCC)    Pulmonary fibrosis (HCC)    Skin cancer     Past Surgical History:  Procedure Laterality Date   HERNIA REPAIR     NASAL SEPTUM SURGERY     RIGHT HEART CATH N/A 05/24/2023   Procedure: RIGHT HEART CATH;  Surgeon: Cherrie Toribio SAUNDERS, MD;  Location: [facility];  Service: Cardiovascular;  Laterality: N/A;   SKIN CANCER EXCISION      Medications:  Current Outpatient Medications  Medication Sig Dispense Refill   acetaminophen  (TYLENOL ) 500 MG tablet Take 500 mg by mouth every 8 (eight) hours as needed for moderate pain (pain score 4-6).     aspirin EC 81 MG tablet Take 81 mg by mouth daily. Swallow whole.     Calcium Carbonate-Vitamin D (CALCIUM 600-VITAMIN D3 PO) Take 1 tablet by mouth daily.     cetirizine (ZYRTEC) 10 MG tablet Take 10 mg by mouth daily.     ESBRIET  801 MG TABS Take 1 tablet (801 mg total) by mouth 3 (three) times daily with meals. 270 tablet 2   fluorouracil (EFUDEX) 5 % cream Apply 1 Application topically 2 (two) times daily as needed (irritation).     fluticasone (FLONASE) 50 MCG/ACT nasal spray Place 2 sprays into both nostrils daily.     gabapentin (NEURONTIN) 300 MG capsule Take 900 mg by mouth 3 (three) times daily.     ipratropium (ATROVENT) 0.06 % nasal spray Place 2 sprays into both  nostrils 3 (three) times daily.     loperamide (IMODIUM) 2 MG capsule Take 1 mg by mouth daily.     meclizine (ANTIVERT) 25 MG tablet Take 25 mg by mouth 2 (two) times daily as needed for dizziness.     Misc Natural Products (ELDERBERRY IMMUNE COMPLEX PO) Take 2 tablets by mouth daily.     Multiple Vitamin (MULTIVITAMIN) tablet Take 1 tablet by mouth daily.     omeprazole (PRILOSEC) 40 MG capsule Take 40 mg by mouth daily.     PARoxetine (PAXIL) 20 MG tablet Take 20 mg by mouth daily.     rosuvastatin (CRESTOR) 5 MG tablet Take 5 mg by mouth daily.     sildenafil (REVATIO) 20 MG tablet Take 20 mg by mouth 3 (three) times daily.     SUMAtriptan (IMITREX) 100 MG tablet Take 100 mg by mouth every 2 (two) hours as needed for migraine.     tamsulosin (FLOMAX) 0.4 MG CAPS capsule Take 0.4 mg by mouth at bedtime.     No current facility-administered medications for this visit.    Allergies  Allergen Reactions   Bee Venom Rash   Shellfish Allergy  Hives   Nintedanib Other (See Comments)    SIRS, fevers   Pseudoephedrine Anxiety    Diagnoses:  Major depressive disorder, recurrent, moderate, irritability Plan of Care: I will meet with the patient every 2 to 3 weeks in person Treatment plan: We will use cognitive behavioral therapy, even though some therapy as well as elements of dialectical behavior therapy to help the patient reduce his anxiety by at least 50% with a target date of  October 30th 2024.  Goals will be to help the patient better manage anxiety symptoms and stress, identify causes for anxiety including changes in his health and ways to lower the anxiety, resolve the conflicts contributing to anxiety and help him manage thoughts and worrisome thinking contributing to his anxiety.  Interventions include providing education about anxiety to help him understand its causes symptoms and triggers..  We will facilitate problem solution skills as well as coping skills to help him manage anxiety  and stress symptoms.  We will use cognitive behavioral therapy to identify and change anxiety provoking thought and behavior patterns as well as dialectical therapy to teach distress tolerance and mindfulness skills. Progress: 35% We will continue with treatment goals as listed above with a new target date of October 31st 2025  Lorrene CHRISTELLA Hasten, [facility]                  Lorrene CHRISTELLA Hasten, [facility]               Lorrene CHRISTELLA Hasten, [facility]               Lorrene CHRISTELLA Hasten, [facility]               Lorrene CHRISTELLA Hasten, [facility]               Lorrene CHRISTELLA Hasten, [facility]               Lorrene CHRISTELLA Hasten, [facility]               Lorrene CHRISTELLA Hasten, [facility]               Lorrene CHRISTELLA Hasten, [facility]               Lorrene CHRISTELLA Hasten, [facility]               Lorrene CHRISTELLA Hasten, [facility]               Lorrene CHRISTELLA Hasten, [facility]               Lorrene CHRISTELLA Hasten, [facility]               Lorrene CHRISTELLA Hasten, [facility]               Lorrene CHRISTELLA Hasten, [facility]               Lorrene CHRISTELLA Hasten, [facility]               Lorrene CHRISTELLA Hasten, [facility]               Lorrene CHRISTELLA Hasten, [facility]            Lorrene CHRISTELLA Hasten, [facility]

## 2023-07-18 NOTE — Telephone Encounter (Signed)
 I sent the message directly to him to monitor the situation but please do give him follow-up first available 30 minutes with spirometry and DLCO within the next 2 or 3 months.

## 2023-07-22 NOTE — Telephone Encounter (Signed)
 Dr Geronimo would like first available with him 30 min with spiro and DLCO within the next 2-3 months

## 2023-07-23 NOTE — Telephone Encounter (Signed)
 Pt scheduled 8/1 with pft and ov.

## 2023-07-30 ENCOUNTER — Encounter: Payer: Self-pay | Admitting: Internal Medicine

## 2023-07-30 DIAGNOSIS — J849 Interstitial pulmonary disease, unspecified: Secondary | ICD-10-CM

## 2023-07-30 MED ORDER — ESBRIET 801 MG PO TABS: 801.0000 mg | ORAL_TABLET | Freq: Three times a day (TID) | ORAL | 2 refills | Status: AC

## 2023-07-30 NOTE — Telephone Encounter (Signed)
 Esbriet refill

## 2023-07-30 NOTE — Progress Notes (Unsigned)
 Refill sent for ESBRIET  to [facility] for Esbriet : [phone]  Dose: 801mg  three times daily  Last OV: 06/18/2023 Provider: Dr. Geronimo Pertinent labs: LFTs on 07/13/2023 wnl  Next OV: 08/16/2023  Sherry Pennant, PharmD, MPH, BCPS Clinical Pharmacist (Rheumatology and Pulmonology)

## 2023-08-05 ENCOUNTER — Ambulatory Visit (INDEPENDENT_AMBULATORY_CARE_PROVIDER_SITE_OTHER): Admitting: Behavioral Health

## 2023-08-05 ENCOUNTER — Encounter: Payer: Self-pay | Admitting: Behavioral Health

## 2023-08-05 DIAGNOSIS — R454 Irritability and anger: Secondary | ICD-10-CM

## 2023-08-05 DIAGNOSIS — F331 Major depressive disorder, recurrent, moderate: Secondary | ICD-10-CM | POA: Diagnosis not present

## 2023-08-05 DIAGNOSIS — F411 Generalized anxiety disorder: Secondary | ICD-10-CM

## 2023-08-05 NOTE — Progress Notes (Signed)
 [facility] Counselor Initial Adult Exam  Name: Ronel Rodeheaver Date: 08/05/2023 MRN: [id number] DOB: 12/17/1950 PCP: Willian Righter, MD  Time spent: 3 PM until 3:53 PM, 53 minutes spent in person with the patient in the outpatient therapist office.  The patient has felt better over the last few weeks that he did previously but still has had some bad days where he just overall does not feel very well.  He is learning to take every day for the day that it is not enjoying what he can and it.  He went to visit his sister and nephew and nephew's family and had a great time.  His nephew and nephew's wife want the patient and his wife to come to [place]  to visit so they are going to rent a recreational vehicle and pick the patient and his wife up and dropped them to [place]  and allow them to stay.  The patient said he is looking forward to that.  The patient somewhat jokingly said he is starting a list of questions that he wants to ask God when he gets to heaven and we talked about what some of those questions are and other things that are running through the patient's mind.  He says he has had a lot of And fever lately so he is trying to find the balance of going out and making sure he is staying well by wearing a mask etc. and also the time at home where he can rest and recuperate and it feels safe and comfortable. He does contract for safety having no thoughts of hurting himself or anyone else.   Guardian/Payee: Self  Paperwork requested: Yes   Reason for Visit /Presenting Problem: Depression  . He does contract for safety having no thoughts of hurting himself or anyone else. Mental Status Exam: Appearance:   Fairly Groomed     Behavior:  Appropriate  Motor:  Normal  Speech/Language:   Clear and Coherent and Normal Rate  Affect:  Appropriate  Mood:  normal  Thought process:  normal  Thought content:    WNL  Sensory/Perceptual disturbances:    WNL  Orientation:  oriented to  person, place, time/date, situation, and day of week  Attention:  Good  Concentration:  Good  Memory:  WNL  Fund of knowledge:   Good  Insight:    Good  Judgment:   Good  Impulse Control:  Good     Reported Symptoms: Depression  Risk Assessment: Danger to Self:  No Self-injurious Behavior: No Danger to Others: No Duty to Warn:no Physical Aggression / Violence:No  Access to Firearms a concern: No  Gang Involvement:No  Patient / guardian was educated about steps to take if suicide or homicide risk level increases between visits: n/a While future psychiatric events cannot be accurately predicted, the patient does not currently require acute inpatient psychiatric care and does not currently meet [place]  involuntary commitment criteria.  Substance Abuse History: Current substance abuse: No     Past Psychiatric History:   Previous psychological history is significant for depression Outpatient Providers: Primary care physician History of Psych Hospitalization: None reported Psychological Testing: n/a   Abuse History:  Victim of: No., None reported   Report needed: No. Victim of Neglect:No. Perpetrator of n/a Witness / Exposure to Domestic Violence: None reported  Protective Services Involvement: No  Witness to [employer] Violence:  No   Family History:  Family History  Problem Relation Age of Onset   Cancer Mother  Congestive Heart Failure Father    Prostate cancer Father    Healthy Sister     Living situation: the patient lives with their spouse  Sexual Orientation: Straight  Relationship Status: married  Name of spouse / other: Did not discuss If a parent, number of children / ages: Adults  Support Systems: spouse  Financial Stress:  No   Income/Employment/Disability: Doctor, general practice: No   Educational History: Education: high school diploma/GED  Religion/Sprituality/World View: Did not discuss  Any cultural  differences that may affect / interfere with treatment:  not applicable   Recreation/Hobbies: Time with family  Stressors: Health problems    Strengths: Supportive Relationships, Hopefulness, Self Advocate, and Able to Communicate Effectively  Barriers:     Legal History: Pending legal issue / charges: The patient has no significant history of legal issues. History of legal issue / charges: Not applicable  Medical History/Surgical History: reviewed Past Medical History:  Diagnosis Date   Anxiety    Aortic stenosis    Flu    Pneumonia    Prostate cancer (HCC)    Pulmonary fibrosis (HCC)    Skin cancer     Past Surgical History:  Procedure Laterality Date   HERNIA REPAIR     NASAL SEPTUM SURGERY     RIGHT HEART CATH N/A 05/24/2023   Procedure: RIGHT HEART CATH;  Surgeon: Cherrie Toribio SAUNDERS, MD;  [facility];  Service: Cardiovascular;  Laterality: N/A;   SKIN CANCER EXCISION      Medications: Current Outpatient Medications  Medication Sig Dispense Refill   acetaminophen  (TYLENOL ) 500 MG tablet Take 500 mg by mouth every 8 (eight) hours as needed for moderate pain (pain score 4-6).     aspirin EC 81 MG tablet Take 81 mg by mouth daily. Swallow whole.     Calcium Carbonate-Vitamin D (CALCIUM 600-VITAMIN D3 PO) Take 1 tablet by mouth daily.     cetirizine (ZYRTEC) 10 MG tablet Take 10 mg by mouth daily.     ESBRIET  801 MG TABS Take 1 tablet (801 mg total) by mouth 3 (three) times daily with meals. 270 tablet 2   fluorouracil (EFUDEX) 5 % cream Apply 1 Application topically 2 (two) times daily as needed (irritation).     fluticasone (FLONASE) 50 MCG/ACT nasal spray Place 2 sprays into both nostrils daily.     gabapentin (NEURONTIN) 300 MG capsule Take 900 mg by mouth 3 (three) times daily.     ipratropium (ATROVENT) 0.06 % nasal spray Place 2 sprays into both nostrils 3 (three) times daily.     loperamide (IMODIUM) 2 MG capsule Take 1 mg by mouth daily.      meclizine (ANTIVERT) 25 MG tablet Take 25 mg by mouth 2 (two) times daily as needed for dizziness.     Misc Natural Products (ELDERBERRY IMMUNE COMPLEX PO) Take 2 tablets by mouth daily.     Multiple Vitamin (MULTIVITAMIN) tablet Take 1 tablet by mouth daily.     omeprazole (PRILOSEC) 40 MG capsule Take 40 mg by mouth daily.     PARoxetine (PAXIL) 20 MG tablet Take 20 mg by mouth daily.     rosuvastatin (CRESTOR) 5 MG tablet Take 5 mg by mouth daily.     sildenafil (REVATIO) 20 MG tablet Take 20 mg by mouth 3 (three) times daily.     SUMAtriptan (IMITREX) 100 MG tablet Take 100 mg by mouth every 2 (two) hours as needed for migraine.  tamsulosin (FLOMAX) 0.4 MG CAPS capsule Take 0.4 mg by mouth at bedtime.     No current facility-administered medications for this visit.    Allergies  Allergen Reactions   Bee Venom Rash   Shellfish Allergy  Hives   Nintedanib Other (See Comments)    SIRS, fevers   Pseudoephedrine Anxiety    Diagnoses:  Major depressive disorder, recurrent, moderate, irritability Plan of Care: I will meet with the patient every 2 to 3 weeks in person Treatment plan: We will use cognitive behavioral therapy, even though some therapy as well as elements of dialectical behavior therapy to help the patient reduce his anxiety by at least 50% with a target date of  October 30th 2024.  Goals will be to help the patient better manage anxiety symptoms and stress, identify causes for anxiety including changes in his health and ways to lower the anxiety, resolve the conflicts contributing to anxiety and help him manage thoughts and worrisome thinking contributing to his anxiety.  Interventions include providing education about anxiety to help him understand its causes symptoms and triggers..  We will facilitate problem solution skills as well as coping skills to help him manage anxiety and stress symptoms.  We will use cognitive behavioral therapy to identify and change anxiety  provoking thought and behavior patterns as well as dialectical therapy to teach distress tolerance and mindfulness skills. Progress: 35% We will continue with treatment goals as listed above with a new target date of November 15, 2023                                  Lorrene CHRISTELLA Hasten, [facility]                  Lorrene CHRISTELLA Hasten, [facility]               Lorrene CHRISTELLA Hasten, [facility]               Lorrene CHRISTELLA Hasten, [facility]               Lorrene CHRISTELLA Hasten, [facility]               Lorrene CHRISTELLA Hasten, [facility]               Lorrene CHRISTELLA Hasten, [facility]               Lorrene CHRISTELLA Hasten, [facility]               Lorrene CHRISTELLA Hasten, [facility]               Lorrene CHRISTELLA Hasten, [facility]               Lorrene CHRISTELLA Hasten, [facility]               Lorrene CHRISTELLA Hasten, [facility]               Lorrene CHRISTELLA Hasten, [facility]               Lorrene CHRISTELLA Hasten, [facility] Counselor Initial Adult Exam  Name: Javani Spratt Date: 08/05/2023 MRN: [id number] DOB: 1950/10/28 PCP: Willian Righter, MD  Time spent:     spent in person with the patient in the outpatient therapist office.  He does contract for safety having no thoughts of hurting himself or anyone else.   Guardian/Payee: Self  Paperwork requested: Yes   Reason for Visit /Presenting Problem: Depression  .  He does contract for safety having no thoughts of hurting himself or anyone else. Mental Status Exam: Appearance:   Fairly Groomed     Behavior:  Appropriate  Motor:  Normal  Speech/Language:   Clear and Coherent and Normal Rate  Affect:  Appropriate  Mood:  normal  Thought process:  normal  Thought content:    WNL  Sensory/Perceptual disturbances:    WNL  Orientation:  oriented to person, place, time/date, situation, and day of week  Attention:  Good  Concentration:  Good  Memory:  WNL  Fund of  knowledge:   Good  Insight:    Good  Judgment:   Good  Impulse Control:  Good     Reported Symptoms: Depression  Risk Assessment: Danger to Self:  No Self-injurious Behavior: No Danger to Others: No Duty to Warn:no Physical Aggression / Violence:No  Access to Firearms a concern: No  Gang Involvement:No  Patient / guardian was educated about steps to take if suicide or homicide risk level increases between visits: n/a While future psychiatric events cannot be accurately predicted, the patient does not currently require acute inpatient psychiatric care and does not currently meet [place]  involuntary commitment criteria.  Substance Abuse History: Current substance abuse: No     Past Psychiatric History:   Previous psychological history is significant for depression Outpatient Providers: Primary care physician History of Psych Hospitalization: None reported Psychological Testing: n/a   Abuse History:  Victim of: No., None reported   Report needed: No. Victim of Neglect:No. Perpetrator of n/a Witness / Exposure to Domestic Violence: None reported  Protective Services Involvement: No  Witness to [employer] Violence:  No   Family History:  Family History  Problem Relation Age of Onset   Cancer Mother    Congestive Heart Failure Father    Prostate cancer Father    Healthy Sister     Living situation: the patient lives with their spouse  Sexual Orientation: Straight  Relationship Status: married  Name of spouse / other: Did not discuss If a parent, number of children / ages: Adults  Support Systems: spouse  Financial Stress:  No   Income/Employment/Disability: Doctor, general practice: No   Educational History: Education: high school diploma/GED  Religion/Sprituality/World View: Did not discuss  Any cultural differences that may affect / interfere with treatment:  not applicable   Recreation/Hobbies: Time with  family  Stressors: Health problems    Strengths: Supportive Relationships, Hopefulness, Self Advocate, and Able to Communicate Effectively  Barriers:     Legal History: Pending legal issue / charges: The patient has no significant history of legal issues. History of legal issue / charges: Not applicable  Medical History/Surgical History: reviewed Past Medical History:  Diagnosis Date   Anxiety    Aortic stenosis    Flu    Pneumonia    Prostate cancer (HCC)    Pulmonary fibrosis (HCC)    Skin cancer     Past Surgical History:  Procedure Laterality Date   HERNIA REPAIR     NASAL SEPTUM SURGERY     RIGHT HEART CATH N/A 05/24/2023   Procedure: RIGHT HEART CATH;  Surgeon: Cherrie Toribio SAUNDERS, MD;  Location: [facility];  Service: Cardiovascular;  Laterality: N/A;   SKIN CANCER EXCISION      Medications: Current Outpatient Medications  Medication Sig Dispense Refill   acetaminophen  (TYLENOL ) 500 MG tablet Take 500 mg by mouth every 8 (eight) hours as needed for moderate pain (pain  score 4-6).     aspirin EC 81 MG tablet Take 81 mg by mouth daily. Swallow whole.     Calcium Carbonate-Vitamin D (CALCIUM 600-VITAMIN D3 PO) Take 1 tablet by mouth daily.     cetirizine (ZYRTEC) 10 MG tablet Take 10 mg by mouth daily.     ESBRIET  801 MG TABS Take 1 tablet (801 mg total) by mouth 3 (three) times daily with meals. 270 tablet 2   fluorouracil (EFUDEX) 5 % cream Apply 1 Application topically 2 (two) times daily as needed (irritation).     fluticasone (FLONASE) 50 MCG/ACT nasal spray Place 2 sprays into both nostrils daily.     gabapentin (NEURONTIN) 300 MG capsule Take 900 mg by mouth 3 (three) times daily.     ipratropium (ATROVENT) 0.06 % nasal spray Place 2 sprays into both nostrils 3 (three) times daily.     loperamide (IMODIUM) 2 MG capsule Take 1 mg by mouth daily.     meclizine (ANTIVERT) 25 MG tablet Take 25 mg by mouth 2 (two) times daily as needed for dizziness.     Misc  Natural Products (ELDERBERRY IMMUNE COMPLEX PO) Take 2 tablets by mouth daily.     Multiple Vitamin (MULTIVITAMIN) tablet Take 1 tablet by mouth daily.     omeprazole (PRILOSEC) 40 MG capsule Take 40 mg by mouth daily.     PARoxetine (PAXIL) 20 MG tablet Take 20 mg by mouth daily.     rosuvastatin (CRESTOR) 5 MG tablet Take 5 mg by mouth daily.     sildenafil (REVATIO) 20 MG tablet Take 20 mg by mouth 3 (three) times daily.     SUMAtriptan (IMITREX) 100 MG tablet Take 100 mg by mouth every 2 (two) hours as needed for migraine.     tamsulosin (FLOMAX) 0.4 MG CAPS capsule Take 0.4 mg by mouth at bedtime.     No current facility-administered medications for this visit.    Allergies  Allergen Reactions   Bee Venom Rash   Shellfish Allergy  Hives   Nintedanib Other (See Comments)    SIRS, fevers   Pseudoephedrine Anxiety    Diagnoses:  Major depressive disorder, recurrent, moderate, irritability Plan of Care: I will meet with the patient every 2 to 3 weeks in person Treatment plan: We will use cognitive behavioral therapy, even though some therapy as well as elements of dialectical behavior therapy to help the patient reduce his anxiety by at least 50% with a target date of  October 30th 2024.  Goals will be to help the patient better manage anxiety symptoms and stress, identify causes for anxiety including changes in his health and ways to lower the anxiety, resolve the conflicts contributing to anxiety and help him manage thoughts and worrisome thinking contributing to his anxiety.  Interventions include providing education about anxiety to help him understand its causes symptoms and triggers..  We will facilitate problem solution skills as well as coping skills to help him manage anxiety and stress symptoms.  We will use cognitive behavioral therapy to identify and change anxiety provoking thought and behavior patterns as well as dialectical therapy to teach distress tolerance and mindfulness  skills. Progress: 35% We will continue with treatment goals as listed above with a new target date of October 31st 2025                                  Lorrene CHRISTELLA Hasten, [facility]  Lorrene CHRISTELLA Hasten, [facility]               Lorrene CHRISTELLA Hasten, [facility]               Lorrene CHRISTELLA Hasten, [facility]               Lorrene CHRISTELLA Hasten, [facility]               Lorrene CHRISTELLA Hasten, [facility]               Lorrene CHRISTELLA Hasten, [facility]               Lorrene CHRISTELLA Hasten, [facility]               Lorrene CHRISTELLA Hasten, [facility]               Lorrene CHRISTELLA Hasten, [facility]               Lorrene CHRISTELLA Hasten, [facility]               Lorrene CHRISTELLA Hasten, [facility]               Lorrene CHRISTELLA Hasten, [facility]               Lorrene CHRISTELLA Hasten, [facility]               Lorrene CHRISTELLA Hasten, [facility]               Lorrene CHRISTELLA Hasten, [facility]               Lorrene CHRISTELLA Hasten, [facility]               Lorrene CHRISTELLA Hasten, [facility]            Lorrene CHRISTELLA Hasten, [facility]               Lorrene CHRISTELLA Hasten, [facility]

## 2023-08-16 ENCOUNTER — Ambulatory Visit (INDEPENDENT_AMBULATORY_CARE_PROVIDER_SITE_OTHER): Admitting: Internal Medicine

## 2023-08-16 ENCOUNTER — Ambulatory Visit: Admitting: Internal Medicine

## 2023-08-16 ENCOUNTER — Encounter: Payer: Self-pay | Admitting: Internal Medicine

## 2023-08-16 ENCOUNTER — Ambulatory Visit: Payer: Self-pay | Admitting: Internal Medicine

## 2023-08-16 VITALS — BP 117/66 | HR 76 | Ht 68.0 in | Wt 201.0 lb

## 2023-08-16 DIAGNOSIS — Z5181 Encounter for therapeutic drug level monitoring: Secondary | ICD-10-CM | POA: Diagnosis not present

## 2023-08-16 DIAGNOSIS — I2723 Pulmonary hypertension due to lung diseases and hypoxia: Secondary | ICD-10-CM

## 2023-08-16 DIAGNOSIS — J9611 Chronic respiratory failure with hypoxia: Secondary | ICD-10-CM

## 2023-08-16 DIAGNOSIS — R42 Dizziness and giddiness: Secondary | ICD-10-CM

## 2023-08-16 DIAGNOSIS — J849 Interstitial pulmonary disease, unspecified: Secondary | ICD-10-CM | POA: Diagnosis not present

## 2023-08-16 DIAGNOSIS — R413 Other amnesia: Secondary | ICD-10-CM

## 2023-08-16 LAB — PULMONARY FUNCTION TEST
DL/VA % pred: 90 %
DL/VA: 3.7 ml/min/mmHg/L
DLCO cor % pred: 51 %
DLCO cor: 12.55 ml/min/mmHg
DLCO unc % pred: 47 %
DLCO unc: 11.61 ml/min/mmHg
FEF 25-75 Pre: 2.44 L/s
FEF2575-%Pred-Pre: 109 %
FEV1-%Pred-Pre: 52 %
FEV1-Pre: 1.56 L
FEV1FVC-%Pred-Pre: 119 %
FEV6-%Pred-Pre: 46 %
FEV6-Pre: 1.78 L
FEV6FVC-%Pred-Pre: 106 %
FVC-%Pred-Pre: 43 %
FVC-Pre: 1.78 L
Pre FEV1/FVC ratio: 88 %
Pre FEV6/FVC Ratio: 100 %

## 2023-08-16 LAB — HEPATIC FUNCTION PANEL
ALT: 11 U/L (ref 0–53)
AST: 20 U/L (ref 0–37)
Albumin: 3.7 g/dL (ref 3.5–5.2)
Alkaline Phosphatase: 100 U/L (ref 39–117)
Bilirubin, Direct: 0.1 mg/dL (ref 0.0–0.3)
Total Bilirubin: 0.3 mg/dL (ref 0.2–1.2)
Total Protein: 7.3 g/dL (ref 6.0–8.3)

## 2023-08-16 NOTE — Patient Instructions (Addendum)
 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 Also might be causing dizziness  Plan  - d/w Dr Lilian and see if you can reduce your sildenafil -   DO NOT Qualify or PHOCUS study with MOSLICIGUAT v Placebo  DUE TO LOW PVR  - consider DECIPHER study (PulmonIX wil be int ouch)  Memory issues And Easy day time somnolence  Plan  - talk to PCP Willian Righter, MD and reduce gabapentin  - refer neurology    Follow-up  - 16  weeks 30-minute visit with Dr. Geronimo but after spirometry and DLCO.

## 2023-08-16 NOTE — Progress Notes (Signed)
 Cardiology Dr. Sherron Champ 02/15/20 Dustin Dean is 73 y.o. year old male with a history of No prior cardiovascular disease who presents to [facility] cardiology for the evaluation of a heart murmur. This patient is a non-smoker and denies a previous history of myocardial infarction, congestive heart failure, or valvular heart disease. 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 He really has little in the way of exertional chest symptoms. Occasionally according to the wife if he walks up an incline briskly he may have some minor shortness of breath but remains active and has little in the way limitation. He denies a previous history of myocardial infarction, congestive heart failure, or known valvular heart disease. At a recent examination he was found to have a heart murmur and an echocardiogram was performed. It demonstrated a peak gradient across his aortic valve of 30 mmHg and an aortic valve area of 1.1 cm. This was read as being consistent with moderate aortic stenosis. I had a fairly significant discussion with the patient and his wife about the implications of his aortic valve and the need to follow the aortic stenosis progression over time.  11/14/2020 office visit with Dustin Dean pulmonary physician assistant at [facility] has past medical history of restrictive lung disease, interstitial lung disease, allergic rhinitis, laryngeal pharyngeal reflux, arthritis, lung nodules and persistent cough. He is a non-smoker and has never smoked. He has medication allergy  to pseudoephedrine.  Dustin Dean presents today with complaint of a progressively worsening shortness of breath and dyspnea on exertion over the past several months. We have been following his interstitial lung disease since 2018. He has been stable without complaint of dyspnea on exertion over the past several years. He indicates that recently he has had more shortness  of breath with exertion. He and his wife will walk the dog in their usual route through the neighborhood that they have done for many years. He reports that he will get short of breath along the root in a way that he has never been bothered with previously. He has recently requested an albuterol inhaler and reports that it has been helpful. He has been using Symbicort 160/4.5 twice daily but has been having difficulty using it successfully as he has been coughing significantly and loses a lot of the medication. A recent chest CT conducted 10/24/2020 notes a progression in the interstitial changes. A 9 mm right lower lobe nodule is also noted. The plan is to have a 33-month follow-up chest CT conducted 02/04/2021. A 6-minute walk test conducted today shows oxygen desaturation and need for supplemental oxygen. I will place the order for supplemental oxygen. I will also refer him to the interstitial lung clinic at [facility] for further evaluation and treatment.   Patient at rest on RA with baseline sats : 97% After exertion for 3 min on RA pt's sats were : 86% Patient placed on oxygen via nasal cannula at 2 lpm flow via portable oxygen concentrator device Exertion for 6 mins with oxygen confirmed final sats at 94%   #1 lung nodules Chest CT 10/24/2020 shows 9 mm right lower lobe lung nodule Follow-up chest CT scheduled for 44-month reevaluation in January 2023  2. Interstitial lung disease Chest CT 11/04/2020 notes fibrotic interstitial lung disease with evidence of progression compared to 08/28/2020 Referral to interstitial lung clinic at [facility] for further evaluation and treatment   #  Primary Care dec 2022 Osteopenia Due for DXA. Prostate cancer Saw Dr. Cam at [facility]. Biopsy + for cancer. They are just watching it. He is going to have MRI and another biopsy next visit.   OV 01/24/2021 -evaluation and transfer of care to Dr. Geronimo in ILD center in [place].  Referred by  Dustin Dean patient support group leader for the pulmonary fibrosis foundation support group  Subjective:  Patient ID: Dustin Dean, male , DOB: 1950/07/01 , age 43 y.o. , MRN: [id number] , ADDRESS: [id number] [place] [address] PCP Dustin Righter, MD Patient Care Team: Dustin Righter, MD as PCP - General (Family Medicine)  This Provider for this visit: Treatment Team:  Attending Provider: Geronimo Amel, MD    01/24/2021 -   Chief Complaint  Patient presents with   Consult    Pt is here for a switch of providers to take over care for his pulmonary fibrosis. 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 Pt does become SOB with activities and also states that he does have a chronic cough.     HPI  Dustin Dean 73 y.o. -new evaluation for interstitial lung disease.  History is provided by review of the chart and also talking to the patient and his wife.  He tells me that he has had chronic cough since his teenage years.  But for the last few to several years its been more consistent.  He was seeing the physician assistant at [facility].  Somewhere along the way he got referred to Dr. Garnette Pepper right at [facility] some 3 years ago.  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  After that cough is largely improved except for occasional exacerbations in the fall in the winter.  Then in 2018 sometime after he went on gabapentin he was informed by the physician assistant that he might have early ILD.  He does not recollect any serology or biopsy being done.  He does recollect a few pulmonary function test.  Then all of a sudden starting September 2022 his cough got out of hand and also developed worsening shortness of breath.  This the first time he started developing worsening shortness of breath.  Then by November 2022 started needing exertional oxygen leading to current symptoms.  He was referred to [facility] but he preferred to establish with the  ILD center here in [place] with us  based on [facility] recommendation.  He found Dustin Dean through Internet search for pulmonary fibrosis foundation.  His current symptom score is below.  He is not on any antifibrotic.  [place] Integrated Comprehensive ILD Questionnaire       Past Medical History :  -He has obesity BMI 33-34 - Denies any collagen vascular disease - He is known to have aortic stenosis -sees cardiologist at [facility].  December 2021 echocardiogram EF 55 to 60% with tricuspid aortic valve and moderate stenosis normal right ventricle -Denies any asthma or COPD.  Denies any heart failure denies any collagen vascular disease -Denies snoring or excessive daytime somnolence.  Denies stroke.  Denies formal diagnosis of sleep apnea denies formal diagnosis of pulmonary hypertension [echo a year ago did not show pulmonary hypertension] -Has had vaccination against COVID but has not had COVID disease -Recent diagnosis of late 2022 early stage prostate cancer at [facility] u[facility] on observation treatment -Has history of skin cancer for which he apply sunscreen regularly  ROS:  -Shortness of breath present  Cough chronic plus -Has intermittent chronic diarrhea and takes Imodium but no other GI side effects -Possible Raynaud's for the last several decades  FAMILY HISTORY of LUNG DISEASE:  Denies family for pulmonary fibrosis.  Denies family Struve COPD or asthma sarcoid or cystic fibrosis.  Denies family history of hypersensitive pneumonitis.  Denies autoimmune disease.  Denies any premature graying of the hair or albinism  PERSONAL EXPOSURE HISTORY:  No prior cigarette smoking -No marijuana use of cocaine use no intravenous drug use.  HOME  EXPOSURE and HOBBY DETAILS :  -Single-family home in a one-story space.  [place]earold setting age of the current home is 31 years.  He is lived there for 6 years.  He does use a feather pillow and this occasionally mold or mildew in the  bathroom.  All his life he is done active gardening with mulch and woodchips and occasionally damp soil.  He is worked out in the yard quite a bit and does gardening.  He enjoys the gardening.  OCCUPATIONAL HISTORY (122 questions) : -He worked in the [employer] at [employer] and then [employer] no [employer].  He does not remember if the buildings had mold in it.  He does not think so -Detail greater than 100 question organic and inorganic antigen history exposure is negative  PULMONARY TOXICITY HISTORY (27 items):  Denies  INVESTIGATIONS: Report of pulmonary function test but I am not able to get it in Care Everywhere       High-resolution CT scan of the chest August 2020  -There is a report of honeycombing but without any craniocaudal gradient.  Air-trapping reported alternative pattern more suggestive of chronic hypersensitive pneumonitis.  But there is already progression from August 2019 -Read by Dr. Toribio Aye   CT Chest data- HRCT 11/04/20 unable for my visualization.   CLINICAL DATA:  Worsening shortness of breath   EXAM:  CT CHEST WITHOUT CONTRAST   TECHNIQUE:  Multidetector CT imaging of the chest was performed following the  standard protocol without intravenous contrast. High resolution  imaging of the lungs, as well as inspiratory and expiratory imaging,  was performed.   COMPARISON:  Chest CT dated August 29, 2018   FINDINGS:  Cardiovascular: Normal heart size. No pericardial effusion. Coronary  artery calcifications of the circumflex. Aortic valve  calcifications. Minimal calcified plaque of the thoracic aorta.   Mediastinum/Nodes: Mildly enlarged mediastinal lymph nodes,  unchanged compared to prior exam and likely reactive. Esophagus and  thyroid are unremarkable.   Lungs/Pleura: Central airways are patent. Bilateral air trapping.  Peribronchovascular and subpleural reticular opacities with traction  bronchiectasis and  no clear craniocaudal predominance. Honeycomb  change primarily seen in the anterior upper lobes is increased when  compared to prior exam. New linear nodular opacity of the right  lower lobe measuring up to 9 mm on series 4, image 210.   Upper Abdomen: No acute abnormality.   Musculoskeletal: No chest wall mass or suspicious bone lesions  identified.   IMPRESSION:  Fibrotic interstitial lung disease with evidence of progression when  compared with August 29, 2018 prior exam. Favor fibrotic  hypersensitivity pneumonitis given presence of air trapping.  Findings are suggestive of an alternative diagnosis (not UIP) per  consensus guidelines: Diagnosis of Idiopathic Pulmonary Fibrosis: An  Official ATS/ERS/JRS/ALAT Clinical Practice Guideline. Am JINNY Honey  Crit Care Med Vol 198, Iss 5, [phone], Sep 15 2016.   New linear nodular opacity of the right lower lobe measuring  up to 9  mm, likely an area of focal fibrosis. Recommend follow-up chest CT  in 3 months to ensure stability.   Aortic Atherosclerosis (ICD10-I70.0).   Addendum 01/26/2021 -radiology Dr. Conley wrote back saying CT scan is classic for chronic HP.  We will call the patient and get the patient started on nintedanib counseling which is first-line for progressive non-- IPF ILD.  If he is reluctant then we will do pirfenidone .  We will set up pharmacy counseling.  If he is reluctant for nintedanib based on risk versus benefit we will do pirfenidone   Electronically Signed    By: Rea Marc M.D.    On: 11/04/2020 14:17  No results found.   Phone visit 01/28/21  Emilyh  LEt Dustin Dean know ahead of the visit followup with me on 02/23/21   A) making referral to [name] to discuss ofev which would be first line baed on what radiologist told me about CT and fact rheumaotid facot is positive  B) rheumatoid factor strongly positive - refer rheumatology Dr Deveshwar/Rice  C) I reviewed echo report (below) Will discuss  at followup  Aortic Valve: There is moderate stenosis, with peak and mean gradients  of 44.000 and 27.000 mmHg.  Aortic valve area calculates to approximately  1.4 cm    Mitral Valve: There is mild regurgitation.    Tricuspid Valve: There is mild regurgitation.    Tricuspid Valve: The right ventricular systolic pressure is normal (<36  mmHg).   1.  Adequate 2D M-mode and color flow Doppler echocardiogram demonstrates  normal left ventricular chamber size and contractility.  There may be mild  left ventricular hypertrophy.  Ejection fraction is estimated 65%  2.  The aortic valve is thickened and demonstrates reduced leaflet  mobility.  There is mild to moderate aortic stenosis with aortic valve  area calculating to 1.4 cm  3.  Mild mitral annular calcification is noted but there is good leaflet  mobility.  Mild mitral insufficiency is noted  4.  Tricuspid valve appears to be normal  5.  The atria are grossly normal size bilaterally  6.  The right ventricular chamber size and function is normal  6.  The pericardium is of normal thickness there is no effusion  OV 02/23/2021  Subjective:  Patient ID: Dustin Dean, male , DOB: 03/15/1950 , age 27 y.o. , MRN: [id number] , ADDRESS: [id number] [place] [address] PCP Dustin Righter, MD Patient Care Team: Dustin Righter, MD as PCP - General (Family Medicine)  This Provider for this visit: Treatment Team:  Attending Provider: Geronimo Amel, MD    02/23/2021 -   Chief Complaint  Patient presents with   Follow-up    Pt recently had a HRCT and PFT and is here today to discuss the results.   Chronic cough on gabapentin  Interstitial lung disease work-up in progress -concern for hypersensitive pneumonitis  -Progressive phenotype between August 2020 and October 2022  9 mm right lower lobe nodule seen in October 2022 for the first time: Stable January 2023  HPI Nicki Gracy 73 y.o. -since his last visit Dr. Conley the  radiologist did look at the CT scan and this confirmed his CT is not consistent with UIP but more likely an alternate pattern consistent with hypersensitive pneumonitis as seen by air trapping.  Patient has had serology work-up in which it is all negative except rheumatoid factor strongly positive.  He is yet to see the rheumatologist.  He does have  significant restrictions on the PFTs with reduced DLCO consistent with his obesity and ILD.  His symptoms are overall stable.  Did go through his exposure history again.  He says in 200 07/2006 and again in 2012 he had had 3 episodes of pneumonia.  All of this happened when he entered school buildings.  He was living in the same house at that time but the house itself was brand-new and there is no mold or mildew.  He is wondering about crawlspace because only [place]  he has seen crawlspace but he said he has never been in the crawl space.  He does not know if there was mold in the crawl space.  I told him it is doubtful that mold or mildew from the crawlspace can get into the house.  He did have a feather pillow but since his last visit he is thrown that out.  He does do gardening and does get exposed to dampness.  At this point in time did discuss with him that he does have ILD and it is progressive.  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  Unclear if the positive rheumatoid factor is playing a role it could be if he has positive for rheumatoid arthritis.  Did indicate that ultimately only a biopsy can put to rest if he has IPF [being Caucasian gentleman greater than 65 is classic phenotype for IPF] but did indicate to him that given progression does not matter whether it is IPF or non-IPF antifibrotic's is indicated.  He is already met with the pharmacist and discussed the 2 antifibrotic's.  Did indicate to him that nintedanib is first-line and non-- IPF progressive phenotype.  Did discuss about the diarrhea.  Denies any  contraindication.  Therefore we will start this.  Did discuss whether diarrhea is willing to go through this.  Also discussed about ILD-Pro registry.  He is interested given the consent form.  Did indicate to him that it probably would be beneficial to do some kind of a limited work-up in differentiating his etiology.  The reasons would be to participate in clinical trials but also in case he needs immunosuppression such as prednisone  or CellCept [indicated for hypersensitive pneumonitis but not for IPF] having a specific diagnosis would be beneficial.  I did think that his obesity puts him at some risk for getting surgical lung biopsy and therefore it might be better to start off with bronchoscopy with lavage and transbronchial biopsies.  We can address this in the future.  This visit was just focused on therapies.  Regarding his 9 mm lower lobe nodule: He had a CT scan of the chest and shows this is stable from October 2022 through January 2023.  The CT scan again reads as alternative pattern consistent with chronic HP.    CT Chest data January 2023  No results found.  Mediastinum/Nodes: Multiple prominent borderline but nonenlarged mediastinal and bilateral hilar lymph nodes, similar to the prior study esophagus is unremarkable in appearance. No axillary lymphadenopathy.   Lungs/Pleura: High-resolution images again demonstrate widespread but patchy areas of ground-glass attenuation, septal thickening, subpleural reticulation, thickening of the peribronchovascular interstitium, cylindrical and varicose traction bronchiectasis, peripheral bronchiolectasis and extensive honeycombing. There is no discernible craniocaudal gradient. The most extensive areas of honeycombing are anterior lung, predominantly in the upper lobes and right middle lobe. Some areas of the lung bases demonstrate relative sparing. Inspiratory and expiratory imaging demonstrates mild to moderate air trapping indicative of  small airways disease. No definite  progression compared to the recent prior study. No acute consolidative airspace disease. No pleural effusions. Previously described nodular area of architectural distortion in the right lower lobe (axial image 106 of series 8) is stable measuring 8 mm on today's examination, likely part of the underlying fibrosis. No other definite suspicious appearing pulmonary nodules or masses are noted.   Upper Abdomen: Unremarkable.   Musculoskeletal: There are no aggressive appearing lytic or blastic lesions noted in the visualized portions of the skeleton.   IMPRESSION: 1. 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 2. Previously noted nodular area of architectural distortion in the right lower lobe is unchanged, likely part of the fibrosis rather than a pulmonary nodule. 3. There are calcifications of the aortic valve and mitral annulus. Echocardiographic correlation for evaluation of potential valvular dysfunction may be warranted if clinically indicated. 4. Aortic atherosclerosis, in addition to 2 vessel coronary artery disease. Please note that although the presence of coronary artery calcium documents the presence of coronary artery disease, the severity of this disease and any potential stenosis cannot be assessed on this non-gated CT examination. Assessment for potential risk factor modification, dietary therapy or pharmacologic therapy may be warranted, if clinically indicated.   Aortic Atherosclerosis (ICD10-I70.0).     Electronically Signed   By: Toribio Aye M.D.   On: 02/14/2021 06:17 stable January 2023  PFT       Latest Reference Range & Units 01/27/21  15:53 01/27/21 15:54  Anti Nuclear Antibody (ANA) NEGATIVE   NEGATIVE  Angiotensin-Converting Enzyme 9 - 67 U/L  49  Anti JO-1 0.0 - 0.9 AI  <0.2  Cyclic Citrullin Peptide Ab UNITS  <16  ds DNA Ab IU/mL  1  ENA RNP Ab 0.0 - 0.9 AI  0.4  RA Latex Turbid. <14 IU/mL  323 (H)  SSA (Ro) (ENA) Antibody, IgG <1.0 NEG AI  <1.0 NEG  SSB (La) (ENA) Antibody, IgG <1.0 NEG AI  <1.0 NEG  Scleroderma (Scl-70) (ENA) Antibody, IgG <1.0 NEG AI  <1.0 NEG  QUANTIFERON-TB GOLD PLUS   Rpt  A.Fumigatus #1 Abs Negative  Negative   Micropolyspora faeni, IgG Negative  Negative   Thermoactinomyces vulgaris, IgG Negative  Negative   A. Pullulans Abs Negative  Negative   Thermoact. Saccharii Negative  Negative   Pigeon Serum Abs Negative  Negative   (H): Data is abnormally high     OV 03/23/2021- acute video visit due to concerns for acute symptoms ? Ofev side effect  Subjective:  Patient ID: Mahari Strahm, male , DOB: 08-Oct-1950 , age 76 y.o. , MRN: [id number] , ADDRESS: [id number] [place] [address] PCP Dustin Righter, MD Patient Care Team: Dustin Righter, MD as PCP - General (Family Medicine)  This Provider for this visit: Treatment Team:  Attending Provider: Geronimo Amel, MD  Type of visit: Video Circumstance: COVID-19 national emergency Identification of patient Dray Dente with 07-09-1950 and MRN [id number] - 2 person identifier Risks: Risks, benefits, limitations of telephone visit explained. Patient understood and verbalized agreement to proceed Anyone else on call: just patient Patient location: Inpatient bed at [facility] which is another health system This provider location: [address] pulmonary office [address]  [place], [place] [address].   03/23/2021 -in this video visit was set up  on acute basis because he started having symptoms after starting nintedanib.  He wanted to know if it was nintedanib symptoms.  The symptoms sound rather complex and little bit  out of proportion for nintedanib.  Therefore I scheduled this video visit and to my surprise he is sitting in a hospital bed at [facility] hospital.    HPI Errick Salts 73 y.o. - started ofev mid-feb 2023. Started noticing feeling disoriented and at times weak. Then on 3/4-03/19/21 was feeling very weak whole bidy weakness and needed walker to even go across room. Tired. Stopped going to gym. Was using o2 all the time. Then on 03/21/21 feelign cold. ANd ahd fever  102F.  Does not remember Tuesday much. Initialyt Dx is pneumonia and testing/imaging -> by tthen pccm saw him Dr Arlina. Procal negative. WBC normal. Concerns is ILD flare up and recommendation is prednisone .  CT scan did not show pneumonia Curently on IV solumedrol. Currently some better. Yesterday ate well. Currently on 2.5LN . He has been covid negative. Multiplex resp virus panel negative per outside chart revie  With ofev was having some nausea. No abd pain. No diarrhea. No stomach cramps. No vomitting   Currently ofev on hold    03/28/2021 Follow up : ILD , Post hospital follow up  Patient returns for a 1 week follow-up visit.  Patient was seen in January 2023 for second opinion for interstitial lung disease.  Patient was felt to have possible concern for hypersensitivity pneumonitis.  He has had a progressive phenotype noted on CT scan from August 2020 to October 2022.  Serology was negative except for a strongly positive rheumatoid factor.  He was referred to rheumatology.  PFT showed significant restriction and reduced DLCO.  Has a history of recurrent pneumonia.  He was recommended to begin antifibrotic's. With OFEV.  Patient was hospitalized last week with severe dyspnea, weakness, activity intolerance to point he could not walk. Confusion, low oxygen levels, and fever-tmax 102. SABRA [place] he has a viral illness and ILD flare.  Patient was hospitalized at an outlying hospital.  He was treated with steroids.  Discharged on a  steroid taper. CT chest 03/22/21 report showed fibrotic ILD, stable since 10/2020, no acute consolidation.  Covid , Influezna  and Viral panel were neg.  Since getting out of the hospital patient is feeling much better. Energy level has picked, decreased oxygen demands, no fever. Appetite is some better . Patient is concerned this could have been from [facility] as his symptoms started when he started OFEV.  Is on Oxygen with activity and At bedtime  2l/m . At rest O2 sats are >90%.      MDD 03/28/21 MDD Impression/Recs: Recent flare up. Bx can be prohibitive. get rheum eval and if neg - manage as HP. Maybe at the most BAL if stable  OV 04/05/2021  Subjective:  Patient ID: Dustin Dean, male , DOB: 12-12-1950 , age 43 y.o. , MRN: [id number] , ADDRESS: [address] [place] [id number] PCP Dustin Righter, MD Patient Care Team: Dustin Righter, MD as PCP - General (Family Medicine)  This Provider for this visit: Treatment Team:  Attending Provider: Geronimo Amel, MD    04/05/2021 -   Chief Complaint  Patient presents with   Follow-up    Pt states he is feeling better after last hospital stay and states each day is getting better.   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 -returns for followup. He is on 1.5 tab of pred curently . Next week is 1 tab pred and off. He is better. Feeling better. AT rest not using o2 because he is better. In fact symp score shows huge imrpovement. Walking desat test is also largey imprpvd. All ? Steroid effects. We had conversation about his recent flare up  - has hx of recurrent pna: 2005-2006 x 2 episodes. . Lived in a different  house. Then again pneumonia 5 years ago in same house. Rx in urgent care. Had high fever. Current started abruptly 2 weeks into ofev andhe wondering if due to ofev. Did not have classic Ofev sid effects. Mentioned to him Ofev reduces riks of flare.  - gardening: no longer gardening  - featherpillow - got rid of it even after last visit   -visible mold - denies but he discussed his crawl space. Says there is no mold bu fungs on the top wall. 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 Biopsy indicated if stable but first rheum consult    04/26/2021- Interim hx  Patient presents today for ILD follow-up. He has been on OFEV for three weeks, currently taking 150mg  twice daily. He has been off oral prednisone  for two weeks. Breathing is not significantly worse off steroids. Cough is the same. Since Saturday he developed chills, fatigue and low grade fever at night. Temp 100.1-100.3. He reprots some heavy breathing. Oxygen 92% or higher. Urinary frequency. Having some incontinence. Continues on Gabapentin for chronic cough. Referred to rheumatology, has an apt beginning of June. He has been avoid exposures mold/mildew, gardening.     05/02/2021 Follow up : ILD , Fever  Patient presents for an acute office visit.  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  Serial CT chest from August 2020 to August 2022 showed progressive changes.  Serology was negative except for a positive rheumatoid factor.  He has been referred to rheumatology with consult pending.  Pulmonary function testing showed significant restriction and decreased DLCO.  Patient started Ofev on March 01, 2021.  Patient was admitted early March with severe weakness dyspnea and activity intolerance.  He also had intermittent confusion hypoxemia and a fever Tmax of 102.  He was felt to have an ILD flare with possible  viral illness however COVID, influenza and viral panel were all negative.  He was treated with steroids.  CT chest showed fibrotic ILD stable since October 2022.  His Ofev was held during admission.  After discharge he said that his energy level was improved and had decreased oxygen demands.  Based on oxygen after discharge was oxygen 2 L with activity and at bedtime.  Not requiring oxygen at rest with O2 saturations greater than 90%.  Patient was restarted on Ofev April 05, 2020.  After 2 weeks of been on Ofev patient developed low-grade fever.  This is waxed and waned and yesterday developed a fever Tmax of 103.  Patient went to the emergency room work-up was unrevealing.  CT chest showed stable ILD with no acute process.  CBC was normal blood cultures  are no growth to date.  Respiratory viral panel was negative.  Lactic acid was 1.5 urinalysis was normal, urine culture is pending.  LFTs were slightly elevated.  Since last night patient says he is feeling better. Fever is resolved. Took Tylenol  . O2 saturations are 100%.  Has some sore throat that started late night .  Had good appetite today . No fever today . No increased Oxygen demands.  Wife is [name].  Patient denies any rash, skin lesions, skin redness, joint swelling, insect or tick bite, travel, discolored mucus, abdominal pain, nausea vomiting or diarrhea. Has no previous joint replacement or surgeries. Abdominal hernia repair >22yrs ago. No abd pain,  Followed by cardiology at [facility].  2D echo January 25, 2018 3 aortic valve is thickened with mild to moderate aortic valve stenosis.,  EF 55 to 60%.  Right ventricle is normal systolic function is normal.     OV 05/19/2021  Subjective:  Patient ID: Dustin Dean, male , DOB: 23-Sep-1950 , age 44 y.o. , MRN: [id number] , ADDRESS: [address] [place] [id number] PCP Dustin Righter, MD Patient Care Team: Dustin Righter, MD as PCP - General (Family Medicine)  This Provider for this visit:  Treatment Team:  Attending Provider: Geronimo Amel, MD    05/19/2021 -   Chief Complaint  Patient presents with   Follow-up    PFT performed today.  Pt states since stopping the OFEV, he has not had anymore fever and states overall he has felt good.    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 -returns with wife [name].  He tells me that after last visit he did a rechallenge with nintedanib and he picked up fever and ended up in the emergency room very similar to March 2023 hospitalization.  Symptoms started 1-2 weeks into taking nintedanib.  His wife feels strongly this is nintedanib related.  Have not seen the patient gets significant side effects from nintedanib but have seen one of the patient gets something similar with pirfenidone .  That particular patient had even after 1 dose.  I think the strength of evidence here is that this systemic inflammatory response is probably related to nintedanib.  Therefore he has stopped it.  I have advised him against taking nintedanib ever again.  Clinically he is feeling stable.  His walking desaturation test is stable.  His pulmonary function test is also stable.  He uses 2 L with exertion but today in office when we walked him on room air he dropped 7 points and this is similar to before.  He wants to try pirfenidone .  We have discussed this in the past.  We also discussed prednisone  but he does not want to do prednisone  because of hyperglycemia.  We discussed the risk, benefits and limitations of pirfenidone .  Pirfenidone  is not well studied in chronic HP.  Is only studied in 1 subtype of non-IPF  progressive phenotype.  Nevertheless is the only other antifibrotic option available.  He wants to try a donor sample.  We will give him 2 months worth.  He is traveling to the beach  once he comes back  he will take it    07/03/2021 Follow up : ILD  Patient returns for a 6-week follow-up.  Patient has underlying interstitial lung disease with progressive phenotype.  Serial CT chest from August 20 to August 2022 showed progressive changes.  Serology was negative except for positive rheumatoid factor.  Rheumatology consult is pending.  Previous PFTs have showed significant restriction and decreased diffusing capacity.  Patient was started on Ofev March 01, 2021.  Patient had a SIRS like response to OFEV x 2 .  Ofev has been stopped indefinitely and advised not to restart.  Patient was placed on a drug holiday.  Started on Esbriet  1 month ago.  Patient says since starting Esbriet  he seems to be tolerating very well.  LFTs prior to start had returned back to normal.  Previously were elevated.  Patient says he has had minimal nausea.  No diarrhea no upset stomach and appetite seems to be normal.  Patient says he has been using sunscreen when he is out side.  Has had no rash.  We discussed returning for ongoing labs. Patient has been referred to pulmonary rehab but has not contacted today. Patient says overall he is doing okay.  Wears his oxygen with activity as needed.  And at bedtime.  Has had no increased oxygen demands. Patient says he still gets short of breath with heavy activities.  But feels like he is stable at this point.  Has had no return of fever.      OV 09/08/2021  Subjective:  Patient ID: Dustin Dean, male , DOB: 1950-06-09 , age 36 y.o. , MRN: [id number] , ADDRESS: [address] [place] [id number] PCP Dustin Righter, MD Patient Care Team: Dustin Righter, MD as PCP - General (Family Medicine)  This Provider for this visit: Treatment Team:  Attending Provider: Geronimo Amel, MD    09/08/2021 -   Chief Complaint  Patient presents with   Follow-up    Pt states he has been doing okay since last visit and denies any complaints.      HPI Mikyle Sox 73 y.o. -returns for follow-up.  He tells me that he is lost more weight.  He is stable.  His symptom score shows good stability.  He is tolerating pirfenidone  really well.  He has lost weight.  Current BMI is 31 but it is all intentional with the help of exercise, pirfenidone  and also eating less calories.  No side effects from pirfenidone  no sunburn nothing.  Is taking 3 to 2 pills 3 times daily.  He is willing to roll over to 1 big pill 3 times daily.  His main questions were -He wants to do some gardening such as cut wine but he assures me that will not be any exposure to organic dust.  -He wants to donate his body after death to medical school.  I advised him that [facility] is fine.  -He also has aortic stenosis.  He saw cardiology today at [facility].  He saw Dr. Sherron Champ.  He is being referred to surgery at [facility] for TAVR consideration.  According to the discussion [not clearly apparent in the chart review] TAVR is being recommended early because of his pulmonary fibrosis.  I did explain to him that in advance pulmonary fibrosis surgical risk is high but did want him to ask the surgeon if there is any downside to operating or placing TAVR procedure when the aortic stenosis is not  critical.  PFT   OV 11/17/2021  Subjective:  Patient ID: Dustin Dean, male , DOB: 01/19/1950 , age 87 y.o. , MRN: [id number] , ADDRESS: [address] [place] [id number] PCP Dustin Righter, MD Patient Care Team: Dustin Righter, MD as PCP - General (Family Medicine)  This Provider for this visit: Treatment Team:  Attending Provider: Geronimo Amel, MD    11/17/2021 -   Chief Complaint  Patient presents with   Follow-up    PFT performed today.  Pt  states he has multiple concerns to discuss.     HPI Asaph Serena 73 y.o. -returns for follow-up.  He is doing well overall.  However for the last 3 days he has had a runny nose and with that he is having decreased energy.  He is got decreased social interest.  He also feels his memory is failing him more in the last 3 days he has noted some memory changes for the last several months but it is a lot worse in the last 3 days since the runny nose.  His COVID is negative but there is no fever brown sputum or yellow sputum.  There is no significant drainage.  There is no worsening shortness of breath there is no worsening cough there is no chills.  In addition he has new complaint of tender itchy scalp for the last 3 weeks there is no rash but he has an appointment with dermatologist because his scalp is dry and flaky.  He is not doing gardening but he does go out occasionally in the sun and he does not wear sunscreen or hat.  He is on pirfenidone  which can cause skin rash and especially in the setting of sun exposure.  I did caution him this could be because of pirfenidone  but he is going to see a dermatologist anyways.  Outside of this he is tolerating his pirfenidone  well.  I did indicate to him he can roll himself into 1 big pill 3 times daily instead of 3 small pills 3 times daily for a total of 9 pills.  He also told me that he is at risk for depression.  He says in the past he has had significant depression he is on antidepressant.  He is worried this can come back with his ILD.  We talked about seeing Adina Sol clinical psychologist who specializes in medical issues he is willing to see him.  Also discussed about doing light therapy  Preoperative evaluation: He has aortic stenosis.  He is saying now that it could be critical.  Apparently TAVR procedure is being planned date unknown.  Currently because of the cold he has suspended pulmonary rehabilitation he wants to know if he can go back.  I did  tell him we need to get clearance from the cardiologist because of critical aortic stenosis history I thought it was moderate in the past.          OV 02/26/2022  Subjective:  Patient ID: Dustin Dean, male , DOB: 06/13/1950 , age 52 y.o. , MRN: [id number] , ADDRESS: [address] [place] [id number] PCP Dustin Righter, MD Patient Care Team: Dustin Righter, MD as PCP - General (Family Medicine)  This Provider for this visit: Treatment Team:  Attending Provider: Geronimo Amel, MD    HPI Dustin Dean 73 y.o. -presents for follow-up.  Presents with his wife.  His wife is an independent historian today.  Since I last saw him in November 2023 several issues have  happened.  1 in mid November 2023 had sinus congestion and he was given doxycycline  and prednisone .  Then in mid December 2023 he had TAVR procedure for his aortic stenosis.  He says this went really well and his murmur has now resolved.  Then straight after Christmas 2023 he had COVID-19.  WE called in molnupiravir .  He is tolerating his pirfenidone  at 801 mg pill 3 times daily without any problems.  He has lost some weight intentionally.  He does admit that after all this he is slightly more short of breath than before..  In fact when he walked him he started desaturating.  This is a new finding.  He had pulmonary function test today that shows significant decline in his DLCO.  This was done today and is captured below.  His most recent hemoglobin on 02/20/2022 was reviewed done at [facility].  Was 13.6 g% and normal.  We discussed the possible need of right heart catheterization.  When he was in the office he did not recall that he actually had a right heart catheterization.  After he left the office when I reviewed the records I noticed that in October 2023 he did have heart catheterization.  At that time he did have a right heart catheterization before the TAVR.  In this the pulmonary capillary wedge pressure was 11 mmHg,  pulmonary artery mean pressure was 27 mmHg and elevated, Fick cardiac index was 3.3 mL.  It was in this catheterization that they saw that he had severe aortic valve stenosis with aortic valve area of 1.05 cm and a valve gradient of 40 mmHg.  He did have follow-up echocardiogram on 02/08/2022.  His left ventricular ejection fraction is 55-60%.  Right ventricle is normal.  There is TAVR bioprosthetic valve.  No pericardial effusion.     Last Weight  Most recent update: 02/26/2022 10:35 AM    Weight  93.4 kg (205 lb 12.8 oz)              Modified Six Minute Walk - 02/26/22 1000     Type of O2 used  O2 3/L   POC pulsed oxygen   Number of laps completed  3    Lap Pace Moderate    Resting Heartrate 83 bpm    Final Heartrate 113 bpm    Resting Pulse Ox 94 %   room air. Patient feels he did not need his POC on at this time.   Desaturated to <= 3 points Yes    Desaturated to < 88% Yes    Distance walked when desaturation occurred  240 feet   patient completed one lap.   Became tachycardic Yes    Symptoms  SOB after one lap on room air.  Applied 3 lpm POC.  SaO2 back to 95%.  patient was able to complete remaining 2 laps - 3 laps total.    Was the O2 correction test done? Yes    Amount of O2 needed to correct destauration 3 L    Distance walked without destat below 88%  480 feet   patient walked remaining 2 laps on 3 lpm POC pulsed oxygen   comments patient desat after 1 lap to 88% room air.  POC oxygen applied  at 3 lpm pulsed.  patient completed remaining 2 laps SaO2 95%.            OV 04/08/2022  Subjective:  Patient ID: Dustin Dean, male , DOB: [birth year] , age 17 y.o. , MRN:  [id number] , ADDRESS: [address] [place] [id number] PCP Dustin Righter, MD Patient Care Team: Dustin Righter, MD as PCP - General (Family Medicine)  This Provider for this visit: Treatment Team:  Attending Provider: Geronimo Amel, MD   02/26/2022 -   Chief Complaint  Patient presents  with   Follow-up    COVID end of December 2023.  Aortic valve replacement (TAVR)  01/02/22.  Cough persistent.  Runny nose improved after seeing ENT.    Latest Reference Range & Units 02/17/16 19:20 04/26/21 15:31 05/01/21 19:55 12/20/21 12:34 01/09/22 23:37  Eosinophils Absolute 0.0 - 0.5 K/uL 0.4 0.3 0.2 0.6 0.4    Anxiety and depression  -Seeing Adina Dianna Finn psychology  04/08/2022 -   Chief Complaint  Patient presents with   Follow-up    Pt is here for follow up. Pt states no adverse effects noted so far for the Esbriet .      HPI Jordany Russett 73 y.o. - returns for followup. Still on esbriet . Symotoms stable. Was suppsoed to have had RHC but on day of procedure DrSam Turner had multiple other patient emergencies. So it is rescheduled. Date pending.  Here to review PFT and CT Due to concern for progression. CT is definitel progressed from a year ago - despite esbriet .  PFT worse since a year ago but since covidd ? Rebounding back. We discuseed disease progression . Eplained next step is to try immune modulator. 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 We took shared decision to awaith RHC. Reommended he hold off clinical trial at this point. Cough still + but says is better with gabapentin. He does have high eos and we will discuss this next visit.  Obesity: losing weight  Wife also present and was independent historian     OV 05/22/2022  Subjective:  Patient ID: Dustin Dean, male , DOB: [birth year] , age 34 y.o. , MRN: [id number] , ADDRESS: [address] [place] [id number] PCP Dustin Righter, MD Patient Care Team: Dustin Righter, MD as PCP - General (Family Medicine)  This Provider for this visit: Treatment Team:  Attending Provider: Geronimo Amel, MD -   05/22/2022 -   Chief Complaint  Patient presents with   Follow-up    F/up on PFT      HPI Montez Cuda 73 y.o. -returns for follow-up for his above issues.  Since his last visit he had his right heart catheterization.  It confirmed pulmonary hypertension present even after his TAVR procedure.  We ordered treprostinil.  He got the package last week and is going to start it tonight after the [employer] agent visits with him and sets up the package for him.  He is looking forward to starting it.  Currently tolerating pirfenidone  well.  He is going through cardiac rehab.  He states his stamina is improved his weight has come down.  His shortness of breath is better.  All reflected below.  Even the cough is better on Neurontin.  He is now seeing counselor Lorrene Hasten and is feeling better.  He had pulmonary function test and it shows continued recent stability.  This is also described below.  He continues to use portable oxygen.  Recently went to [facility] and did some unloading and he did not need oxygen.  Today made him do a sit/stand test x 10 times.  He  did not desaturate below 93%.  OV 06/19/22  16-year-old male followed for interstitial lung disease with progressive phenotype (diagnosed in 2019) Seen for pulmonary consult to establish for ILD in January 2023.  Has chronic respiratory failure on oxygen History of prostate cancer.  History of aortic valve stenosis followed by cardiology at [facility] video visit is a 1 month follow-up for pulmonary hypertension related ILD patient says since last visit he is doing better.  Has completed pulmonary rehab.  And now is beginning a new exercise class at local gym.  Patient remains on Esbriet .  He says he has no issues.  Appetite is good with no nausea vomiting or diarrhea.  He was recently started on Tyvaso. Patient is doing well. Completing pulmonary rehab.  Going to a new exercise class Tolerating Tyvaso well.  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  Remains on oxygen  2 L with activity and at bedtime.  OV 08/21/2022  Subjective:  Patient ID: Dustin Dean, male , DOB: 1950-11-09 , age 89 y.o. , MRN: [id number] , ADDRESS: [address] [place] [id number] PCP Dustin Righter, MD Patient Care Team: Dustin Righter, MD as PCP - General (Family Medicine)  This Provider for this visit: Treatment Team:  Attending Provider: Geronimo Amel, MD  08/21/2022 -   Chief Complaint  Patient presents with   Follow-up    Currently in hosp. wants to discuss treatment.    Type of visit: Video Virtual Visit Identification of patient Ramin Zoll with April 29, 1950 and MRN [id number] - 2 person identifier Risks: Risks, benefits, limitations of telephone visit explained. Patient understood and verbalized agreement to proceed Anyone else on call: just patient Patient location: [facility] inpatient status [facility] This provider location: [address], [address]; [place]; [place] [address]. [facility]. [phone]     HPI Lyon Dumont 73 y.o. -on this video visit as a follow-up video visit because he is now an inpatient at [facility] which is not part of [facility].  He converted the onsite visit to a video visit.  He told me 08/16/2022 and confirmed on review of the external medical records he got admitted with sudden desaturations.  He has been treated with steroids he was requiring 6 L.  He has somewhat improved.  He wants to get discharged but he says when they walked him with 3 L nasal cannula he desaturated. Labs at [facility] creat normal. CTA angio - negative for PE. Says RVP negaive. He feels he is getting better. He wants to go home. I d/w  over phone with Dr Todd Duet of ccm there. Not on his rounding list but will review with primary/. Care everywhere does not indicate echo/bnp - indicated for this to be done + Consider HRCT + PCCM consult there with Dr Duet HARPER Aug 2024   09/05/2022 Patient  presents today for hospital follow-up.  Patient was admitted for community-acquired pneumonia and discharged on 08/22/22. He originally presents to ED with low oxygen levels. CTA negative for PE, possible infiltrate.  He completed course of cefepime and doxycycline . He had a video visit with Dr. Geronimo on 08/21/22 while admitted to [facility]. He was advised to stay on prednisone  until follow-up in clinic.He is oxygen dependent for hx chronic respiratory failure. He was discharged on 6L oxygen; he is currently using 2L oxygen at rest and 3L with exertion. Needs  large home oxygen concentrator. Continues Esbriet  and Tyvaso as directed. Patient had PFT on July 30th prior to admission. Echo/ bnp norm   OV 12/20/2022  Subjective:  Patient ID: Dustin Dean, male , DOB: [birth year] , age 85 y.o. , MRN: [id number] , ADDRESS: [address] [birth year][address] [place] [id number] PCP Dustin Righter, MD Patient Care Team: Dustin Righter, MD as PCP - General (Family Medicine)  This Provider for this visit: Treatment Team:  Attending Provider: Geronimo Amel, MD   12/20/2022 -   Chief Complaint  Patient presents with   Follow-up    Pft and ct f/u. Pt denies any concerns      HPI Nehemias Sauceda 73 y.o. -returns for follow-up.  Presents with his wife who is an independent historian.  I personally saw him in May 2024 but since then much has happened.  He got admitted to hospital with pulmonary fibrosis flareup.  Since then he is on 4-5 L continuous.  Today turned his oxygen off he was 87% room air at rest.  Better soon as he moves he says he desaturates.  His lung function has significantly declined.  His FVC is down 16% since February 2024.  He had a CT scan of the chest just before Thanksgiving 2024 and our radiologist thought he is not worse compared to earlier this year but definitely worse compared to a year ago but his history and his lung function show clearly he is declining.  He stopped taking inhaled  treprostinil for his WHO group 3 pulmonary hypertension because of severe cough.  However he is tolerating his pirfenidone  well.  He lives with his wife.  There is a young man and his wife who are basically like surrogate children.  That is a social support.  He says at this point in time he is on this attending that his life expectancy is diminished.  We had a long conversation about goals of care.  Talked about therapeutic options of chronic prednisone , CellCept and transplant.  He is not interested in any of this.  This because of side effect profile.  He wanted know about his life expectancy.  I did tell him there is a 10 fold increase risk of death in 09/11/23 because of his significant decline.  I did tell him that I will be very surprised if he is alive 2 years from now and modestly surprised if he was alive a year from now.  We talked about the concept of home palliative care.  We talked about the concept of hospice.  He has done a living will however he has not done a MOST form.  I did explain to him to consider himself no CPR and no intubation but accept BiPAP and simple medical treatment.  Also offered the concept of home palliative care at this point in time and if further decline this can be converted to hospice.  He is processing all this information.  His wife is in tears.  Patient himself states he is realistic.    Discussed medicare hospice benefit  - a medicare paid benefit  - for people with terminal qualifying  illness such as IPF, COPD, cancer with statistical prognosis < 6 months for which there is no cure - utilization of hospice shows people live longer paradoxically than those without hospice due to improved attention  - explained hospice locations - home, residential etc.,   - explained respite care options for caregivers  - explained that she could still get treatment for  non-hospice diagnosis and still come to office to see me for hospice related diagnosis that i provide  support for  - explained that hospice provides nursing, MD, chaplain, volunteer and medications and supplies paid through medicare      He has some urgent continence.  He wants to know if he can take Myrbetriq.  I told him that he could tell his urologist he could.     OV 03/21/2023  Subjective:  Patient ID: Dustin Dean, male , DOB: [birth year] , age 65 y.o. , MRN: [id number] , ADDRESS: [id number] [place] [id number] PCP Dustin Righter, MD Patient Care Team: Dustin Righter, MD as PCP - General (Family Medicine)  This Provider for this visit: Treatment Team:  Attending Provider: Geronimo Amel, MD    03/21/2023 -   Chief Complaint  Patient presents with   Follow-up    Interstitial lung disease, wants to see have everything is going with lungs, had pft done today     HPI Octavian Godek 73 y.o. -returns for follow-up.  Presents with his wife.  He is saying that he is actually doing quite well.  And he is stable.  His energy levels are better.  He is tolerating pirfenidone  well.  His oxygen requirements are stable 5 L at rest and 5 L with exertion.  Occasionally if he overexerts he will go down to 87% on 5 L.  He has no major concerns.  We discussed clinical trials as a care option  alify for it.      OV 06/18/2023  Subjective:  Patient ID: Dustin Dean, male , DOB: [birth year] , age 30 y.o. , MRN: [id number] , ADDRESS: [id number] [place] [address] PCP Dustin Righter, MD Patient Care Team: Dustin Righter, MD as PCP - General (Family Medicine)  This Provider for this visit: Treatment Team:  Attending Provider: Geronimo Amel, MD  06/18/2023 -   Chief Complaint  Patient presents with   Interstitial Lung Disease     HPI Dhairya Corales 73 y.o. -presents for follow-up.  He had a right heart catheterization repeat because he was intolerant to time also we wanted to see if he would qualify for recent study but his PVR was low less than 4 and  therefore he did not qualify.  Currently he is on pirfenidone .  Overall he is stable.  See symptom score.  Excess hypoxemia continues without change.  He pulmonary function test and it is stable.  At this point in time we decided to take supportive care approach with continued pirfenidone .  In the future Nerandomilast might be approved and if he has progression or he wants to switch over we could try that.  Does not much appetite and in his situation for CellCept at this point.  Pulmonary function test also stable probably since last July.  Answered several questions he and his wife had.       OV 08/16/2023  Subjective:  Patient ID: Dustin Dean, male , DOB: [birth year] , age 89 y.o. 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  -Progressive phenotype between August 2020 and October 2022   - started ofev mid feb 2023 -> early march 2023 admitted for ILD flare -> rechallenged ofev April 2023 -> ER vist SIRS   - SAE with OFev/ Ofev stopped  - started Pirfenioned May 2023 (rjected prednisone  due to side effec tprofile)  - transplaint discussion marrch 2-24 and December 2024- not interested -   -Not interested in transplant, chronic prednisone  and CellCept December 2024  9 mm right lower lobe nodule seen in October 2022 for the first time: Stable January 2023 repoted as fibrosis ->Not reported n feb 2024   8 mm lateral subpleural left lower lobe nodule (5/93),-> unchanged from 08/30/2017.-> through feb 2024  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  Respiratory exacerbations/infections  -Mid November 2023: Sinus congestion and given doxycycline  and prednisone   -01/09/2022: COVID-19 and given molnupiravir  E  -Summer 2024 admission  RAST allergy  panel December 2023 -Mildly positive for cat and dog dander.  Otherwise negative.  Normal IgE'. ELevaed Eos  High risk med  - Esbriet /Pirfenidone  requires intensive drug monitoring due to high concerns for Adverse effects of , including  Drug Induced Liver Injury, significant GI side effects that include but not limited to Diarrhea, Nausea, Vomiting,  and other system side effects that include Fatigue, headaches, weight loss and other side effects such as skin rash. These will be monitored with  blood work such as LFT initially once a month for 6 months and then quarterly   HPI Jailin Moomaw 73 y.o. -returns for follow-up.  Presents with his wife.  He and his wife report that he is overall okay.  On 07/13/2023 in the interim since last visit he ended up in the ER at [facility].  He presented with flulike symptoms.  His creatinine and liver function test was normal.  COVID and flu test were negative.  Troponin was normal.  BNP was normal.  He had CT angiogram of the chest and there is no pulmonary embolism and he was discharged.  This was confirmed by external record review.  He says his significant issues   - Respiratory he feels stable.  Requiring oxygen at rest 5 L. -  he complains of intermittent  nonspecific dizziness for the last 1 year.  This around the same time he started Aubagio for his pulmonary hypertension.  There is a new complaint - He is also complaining of memory issues.  I noticed that he is on gabapentin - He is also complains of easy fatigue and napping during the day.  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  He has received the consent is reflecting on it.  He is not sure.       SYMPTOM SCALE - ILD 01/24/2021 02/23/2021   04/05/2021  Post hopsilaization. On pred taper and off ofev Ofev 150 twice daily; No prednsone  05/19/2021 214# 11/17/2021  02/26/2022 205# 04/08/2022 199# 05/22/2022 199# - cardica reab 12/20/2022  03/21/2023  06/18/2023 Right heart cath done shows low PVR with normal wedge, mean PA 27 08/16/2023   Current weight         esbiret esbriet  esbiret Esbietr - going to start tyvaso tonight Esbriet , sildenafil. OFF tyvaso Esbriet , sildenafil Pirfenidone  and sildenafil Esbriet , oxygen and sildenafil  O2 use 2L [place] with ex 2L with ex 2L Wth ex at hoe 2L           Shortness of Breath 0 -> 5 scale with 5 being worst (score 6 If unable to do)                At rest 0 0 0 1 0 0 0 0 0 - 0 0 0  Simple tasks - showers, clothes change, eating, shaving 1 1 0.5 2 1 1  0 0 0 2 0 1 1  Household (dishes, doing bed, laundry) 1 1 1 2 2 1 1 1 1 3 1 2 1   Shopping 2 1 0 NA 1 1 1 1 1 3 1 1 1   Walking level at own pace 5 2 0 1 (with oxygen) 1 2 2 1  0 3 2 2 2   Walking up Stairs 5 3 1  with o2, 6 without o2 2 (with oxygen) 2 3 3 2 2 4 4 4 5   Total (30-36) Dyspnea Score 14 8   8 7 8 7 5  with o2 4 with o2 15 8 10 10   How bad is your cough? 2 3 0 with gbapentin 1-2 (on gabapentin) 2 3 2 3 1 4 1 2 3   How bad is your fatigue 1 2 0 4 1 4 0 0 0 3  1 20 3   How bad is nausea 00000 0 0 0 0 0 0 0 0 0  0 0 0  How bad is vomiting?   0 0 0 0 0 0 0 0 0 0  0 0 00  How bad is diarrhea? 0 0 0 0 0 0 0 0 0 0  0 0 0  How bad is anxiety? 0 0 0 0 0 2 0 1 3 0   0 1 00  How bad is depression 0 0 0 0 (on antidepressant) 1 2 0 1 3 0 0 0 0  Any chronic pain - if so where and how bad 0 0 0 0 0  1 x x x 0 0 0     Simple office walk 185 feet x  3 laps goal with forehead probe 01/24/2021  04/05/2021 On prendisone. Off ofev 05/19/2021 Off ofev 09/08/2021 On esbiret since may 2023 02/26/2022 Esbiret since may 2023 04/06/22 05/22/2022 Full dose Esbriet .  Going to start treprostinil 12/20/2022 87-89% RA at rest  O2 used Ra at rest, desat at 1 lap and then walked with 2L piulse Ra - being on room air for 15 min and in mddile of pred burst taper   ra ra Room air   Number laps completed 1 lap on RA, then 3 laps on 2L Did all 3 laps   1 of 3 laps - forehead probe 3 of 3 laps Sit/stand x 10 times   Comments about pace x avg  avg av     Resting Pulse Ox/HR 96% and 88/min at 1 lap 98% an Hr 92 98% AND HR 81 98% and HR  77 94% 94% and HR 82 98 send and heart rate 81   Final Pulse Ox/HR 92% and 115/min on 3 laps on 2L pulsed 92% and HR 117 91% and HR 115 92% and HR 113 88% 88% and HR 109 93% and heart rate 102   Desaturated </= 88% no no no no      Desaturated <= 3% points yes Yes, 6  Yes 7  Yes 6 pots      Got Tachycardic >/= 90/min yes yes yes avg      Symptoms at end of test Modreate dyspnea Modate dyspnea Mild dyspnea Mild dyspnea   Level 2 out of 10 dyspnea   Miscellaneous comments Corrected with 2L [place] Did not need o2, improved              SIT STAND TEST - goal 15 times   06/18/2023    O2 used ra   PRobe - finter or forehead fnger   Number sit and stand completed - goal 15 15   Time taken to complete slow   Resting Pulse Ox/HR/Dyspnea  92% and 80/min and dyspnea of 0/10    Peak measures 85 % and 115/min and dyspnea of 6/10   Final Pulse Ox/HR 74% and 112/min and dyspnea of 6/10   Desaturated </= 88% yes   Desaturated <= 3% points yes   Got Tachycardic >/= 90/min ydes   Miscellaneous comments x        PFT     Latest Ref Rng & Units 08/16/2023    9:51  AM 06/13/2023    1:06 PM 03/21/2023   11:24 AM 12/18/2022    4:26 PM 08/14/2022   10:53 AM 05/22/2022    9:53 AM 04/04/2022    8:31 AM  PFT Results  FVC-Pre L 1.78  P 1.81  1.86  1.69  1.88  2.02  1.98   FVC-Predicted Pre % 43  P 44  45  41  45  48  51   FVC-Post L       2.18   FVC-Predicted Post %       57   Pre FEV1/FVC % % 88  P 89  92  89  86  90  89   Post FEV1/FCV % %       93   FEV1-Pre L 1.56  P 1.61  1.72  1.50  1.62  1.81  1.76   FEV1-Predicted Pre % 52  P 54  57  49  53  59  62   FEV1-Post L       2.03   DLCO uncorrected ml/min/mmHg 11.61  P   10.20  15.13  14.22  16.38   DLCO UNC% % 47  P   41  62  57  71   DLCO corrected ml/min/mmHg 12.55  P   10.20  15.13  14.38  16.38   DLCO COR %Predicted % 51  P   41  62  58  71   DLVA Predicted % 90  P   72  109  103  111   TLC L       3.57   TLC % Predicted %       57   RV % Predicted %       78     P Preliminary result       LAB RESULTS last 96 hours No results found.       has a past  medical history of Anxiety, Aortic stenosis, Flu, Pneumonia, Prostate cancer (HCC), Pulmonary fibrosis (HCC), and Skin cancer.   reports that he has never smoked. He has been exposed to tobacco smoke. He has never used smokeless tobacco.  Past Surgical History:  Procedure Laterality Date   HERNIA REPAIR     NASAL SEPTUM SURGERY     RIGHT HEART CATH N/A 05/24/2023   Procedure: RIGHT HEART CATH;  Surgeon: Cherrie Toribio SAUNDERS, MD;  Location: [facility] INVASIVE [facility];  Service: Cardiovascular;  Laterality: N/A;   SKIN CANCER EXCISION      Allergies  Allergen Reactions   Bee Venom Rash   Shellfish Allergy  Hives   Nintedanib Other (See Comments)    SIRS, fevers   Pseudoephedrine Anxiety    Immunization History  Administered Date(s) Administered   Fluad Quad(high Dose 65+) 10/23/2017, 09/30/2019, 10/14/2020, 10/18/2020   Influenza-Unspecified 11/11/2009, 11/08/2011, 11/03/2013, 11/22/2014, 11/10/2015   Moderna Covid-19 Fall Seasonal Vaccine  88yrs & older 10/05/2022   Moderna Covid-19 Vaccine Bivalent Booster 72yrs & up 10/24/2020   Moderna Sars-Covid-2 Vaccination 03/19/2019, 04/16/2019, 11/16/2019, 05/07/2020   Pfizer(Comirnaty)Fall Seasonal Vaccine 12 years and older 10/19/2021   Pneumococcal Conjugate-13 12/08/2007, 12/21/2015, 09/04/2018   Pneumococcal Polysaccharide-23 12/08/2007, 06/21/2017, 06/16/2018   Tdap 12/08/2007, 11/27/2018   Zoster Recombinant(Shingrix) 10/30/2013, 09/04/2018, 11/27/2018   Zoster, Live 10/30/2013   Zoster, Unspecified 10/30/2013    Family History  Problem Relation Age of Onset   Cancer Mother    Congestive Heart Failure Father    Prostate cancer Father    Healthy Sister      Current Outpatient Medications:    acetaminophen  (TYLENOL ) 500 MG tablet, Take 500 mg by mouth every 8 (eight) hours as needed for moderate pain (pain score 4-6)., Disp: , Rfl:    aspirin EC 81 MG tablet, Take 81 mg by mouth daily. Swallow whole., Disp: , Rfl:    Calcium Carbonate-Vitamin D (CALCIUM 600-VITAMIN D3 PO), Take 1 tablet by mouth daily., Disp: , Rfl:    cetirizine (ZYRTEC) 10 MG tablet, Take 10 mg by mouth daily., Disp: , Rfl:    ESBRIET  801 MG TABS, Take 1 tablet (801 mg total) by mouth 3 (three) times daily with meals., Disp: 270 tablet, Rfl: 2   fluorouracil (EFUDEX) 5 % cream, Apply 1 Application topically 2 (two) times daily as needed (irritation)., Disp: , Rfl:    fluticasone (FLONASE) 50 MCG/ACT nasal spray, Place 2 sprays into both nostrils daily., Disp: , Rfl:    gabapentin (NEURONTIN) 300 MG capsule, Take 900 mg by mouth 3 (three) times daily., Disp: , Rfl:    ipratropium (ATROVENT) 0.06 % nasal spray, Place 2 sprays into both nostrils 3 (three) times daily., Disp: , Rfl:    loperamide (IMODIUM) 2 MG capsule, Take 1 mg by mouth daily., Disp: , Rfl:    meclizine (ANTIVERT) 25 MG tablet, Take 25 mg by mouth 2 (two) times daily as needed for dizziness., Disp: , Rfl:    Misc Natural Products  (ELDERBERRY IMMUNE COMPLEX PO), Take 2 tablets by mouth daily., Disp: , Rfl:    Multiple Vitamin (MULTIVITAMIN) tablet, Take 1 tablet by mouth daily., Disp: , Rfl:    omeprazole (PRILOSEC) 40 MG capsule, Take 40 mg by mouth daily., Disp: , Rfl:    PARoxetine (PAXIL) 20 MG tablet, Take 20 mg by mouth daily., Disp: , Rfl:    rosuvastatin (CRESTOR) 5 MG tablet, Take 5 mg by mouth daily., Disp: , Rfl:    sildenafil (REVATIO) 20  MG tablet, Take 20 mg by mouth 3 (three) times daily., Disp: , Rfl:    SUMAtriptan (IMITREX) 100 MG tablet, Take 100 mg by mouth every 2 (two) hours as needed for migraine., Disp: , Rfl:       Objective:   Vitals:   08/16/23 1010  BP: 117/66  Pulse: 76  SpO2: 98%  Weight: 201 lb (91.2 kg)  Height: 5' 8 (1.727 m)    Estimated body mass index is 30.56 kg/m as calculated from the following:   Height as of this encounter: 5' 8 (1.727 m).   Weight as of this encounter: 201 lb (91.2 kg).  @WEIGHTCHANGE @  [employer]   08/16/23 1010  Weight: 201 lb (91.2 kg)     Physical Exam   General: No distress. Looks well O2 at rest: yes 5L Cane present: no Sitting in wheel chair: no Frail: ni Obese: yes Neuro: Alert and Oriented x 3. GCS 15. Speech normal Psych: Pleasant Resp:  Barrel Chest - no.  Wheeze - no, Crackles - yes, No overt respiratory distress CVS: Normal heart sounds. Murmurs - no Ext: Stigmata of Connective Tissue Disease - none HEENT: Normal upper airway. PEERL +. No post nasal drip        Assessment/     Assessment & Plan Chronic respiratory failure with hypoxia (HCC)  ILD (interstitial lung disease) (HCC)  Encounter for therapeutic drug monitoring  WHO group 3 pulmonary arterial hypertension (HCC)  Memory change  Dizziness    PLAN Patient Instructions  LD (interstitial lung disease) (HCC) -high suspect for chronic hypersensitive pneumonitis Pulmonary air trapping Chronic respiratory failure with hypoxia (HCC) Rheumatoid  factor positive Encounter for therapeutic drug monitoring  - progressive diseaese - PFT worse in aug 2025 versus Summer 2024 but mor recently stable  - needing 5L [place] at all times - noted tyvaso intolerance -Tolerating esbrioet quite well.  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 Also might be causing dizziness  Plan  - d/w Dr Lilian and see if you can reduce your sildenafil -   DO NOT Qualify or PHOCUS study with MOSLICIGUAT v Placebo  DUE TO LOW PVR  - consider DECIPHER study (PulmonIX wil be int ouch)  Memory issues And Easy day time somnolence  Plan  - talk to PCP Dustin Righter, MD and reduce gabapentin  - refer neurology    Follow-up  - 16  weeks 30-minute visit with Dr. Geronimo but after spirometry and DLCO.    FOLLOWUP    Return in about 16 weeks (around 12/06/2023) for 30 min visit, after Spiro and DLCO, with Dr Dustin, ILD.  ( Level 05 visit E&M 2024: Estb >= 40 min  in  visit type: on-site physical face to visit  in total care time and counseling or/and coordination of care by this undersigned MD - Dr Dorethia Dustin. This includes one or more of the following on this same day 08/16/2023: pre-charting, chart review, note writing, documentation  discussion of test results, diagnostic or treatment recommendations, prognosis, risks and benefits of management options, instructions, education, compliance or risk-factor reduction. It excludes time spent by the CMA or office staff in the care of the patient.  Actual time 40 min)   SIGNATURE    Dr. Dorethia Cave, M.D., F.C.C.P,  Pulmonary and Critical Care Medicine Staff Physician, [facility] Director - Interstitial Lung Disease  Program  Pulmonary Fibrosis [facility] [place], [place], [address]  Pager: [phone], If no answer or between  15:00h - 7:00h: call [phone] Telephone: [phone]  10:32 PM 08/16/2023

## 2023-08-16 NOTE — Patient Instructions (Signed)
 Spirometry/diffusion capacity performed today.

## 2023-08-16 NOTE — Progress Notes (Signed)
Spirometry/Diffusion capacity performed today. 

## 2023-08-16 NOTE — Progress Notes (Signed)
 LFT NORMAL

## 2023-08-26 ENCOUNTER — Ambulatory Visit (INDEPENDENT_AMBULATORY_CARE_PROVIDER_SITE_OTHER): Admitting: Behavioral Health

## 2023-08-26 ENCOUNTER — Encounter: Payer: Self-pay | Admitting: Behavioral Health

## 2023-08-26 DIAGNOSIS — F411 Generalized anxiety disorder: Secondary | ICD-10-CM

## 2023-08-26 DIAGNOSIS — R454 Irritability and anger: Secondary | ICD-10-CM

## 2023-08-26 DIAGNOSIS — F331 Major depressive disorder, recurrent, moderate: Secondary | ICD-10-CM | POA: Diagnosis not present

## 2023-08-26 NOTE — Progress Notes (Signed)
 [facility] Counselor Initial Adult Exam  Name: Dustin Dean Date: 08/26/2023 MRN: [id number] DOB: 11-Jan-1951 PCP: Willian Righter, MD  Time spent: 2 PM until 2:56 PM, 56 minutes spent in person with the patient in the outpatient therapist office. The patient met with his pulmonologist since our last session and said there had not been any significant changes since his previous visit to the pulmonologist and only minimal changes over the past year.  He can see some changes in terms of energy and his breathing.  He did say there have been some short-term memory issues and the question if it might be medication.  He went on gabapentin and because of the cough but he has not noticed a substantial difference because of that so his PCP will discuss the cutting that back with him to see if that helps with the memory issues.  Otherwise things have been okay.  He is cousin's husband died recently after suffering with cancer for a while and he would like to have gone but was concerned about the long trip that would have been up there so they watched the service broadcast virtually.  They are going to the beach the first week of September which she is excited about.  We talked about some different day trips in this area that he could take.  He feels like he has everything lined up that he needs to and is feeling very positive and optimistic.   He does contract for safety having no thoughts of hurting himself or anyone else.   Guardian/Payee: Self  Paperwork requested: Yes   Reason for Visit /Presenting Problem: Depression  . He does contract for safety having no thoughts of hurting himself or anyone else. Mental Status Exam: Appearance:   Fairly Groomed     Behavior:  Appropriate  Motor:  Normal  Speech/Language:   Clear and Coherent and Normal Rate  Affect:  Appropriate  Mood:  normal  Thought process:  normal  Thought content:    WNL  Sensory/Perceptual disturbances:    WNL   Orientation:  oriented to person, place, time/date, situation, and day of week  Attention:  Good  Concentration:  Good  Memory:  WNL  Fund of knowledge:   Good  Insight:    Good  Judgment:   Good  Impulse Control:  Good     Reported Symptoms: Depression  Risk Assessment: Danger to Self:  No Self-injurious Behavior: No Danger to Others: No Duty to Warn:no Physical Aggression / Violence:No  Access to Firearms a concern: No  Gang Involvement:No  Patient / guardian was educated about steps to take if suicide or homicide risk level increases between visits: n/a While future psychiatric events cannot be accurately predicted, the patient does not currently require acute inpatient psychiatric care and does not currently meet [place]  involuntary commitment criteria.  Substance Abuse History: Current substance abuse: No     Past Psychiatric History:   Previous psychological history is significant for depression Outpatient Providers: Primary care physician History of Psych Hospitalization: None reported Psychological Testing: n/a   Abuse History:  Victim of: No., None reported   Report needed: No. Victim of Neglect:No. Perpetrator of n/a Witness / Exposure to Domestic Violence: None reported  Protective Services Involvement: No  Witness to [employer] Violence:  No   Family History:  Family History  Problem Relation Age of Onset   Cancer Mother    Congestive Heart Failure Father    Prostate cancer Father  Healthy Sister     Living situation: the patient lives with their spouse  Sexual Orientation: Straight  Relationship Status: married  Name of spouse / other: Did not discuss If a parent, number of children / ages: Adults  Support Systems: spouse  Financial Stress:  No   Income/Employment/Disability: Doctor, general practice: No   Educational History: Education: high school diploma/GED  Religion/Sprituality/World View: Did not  discuss  Any cultural differences that may affect / interfere with treatment:  not applicable   Recreation/Hobbies: Time with family  Stressors: Health problems    Strengths: Supportive Relationships, Hopefulness, Self Advocate, and Able to Communicate Effectively  Barriers:     Legal History: Pending legal issue / charges: The patient has no significant history of legal issues. History of legal issue / charges: Not applicable  Medical History/Surgical History: reviewed Past Medical History:  Diagnosis Date   Anxiety    Aortic stenosis    Flu    Pneumonia    Prostate cancer (HCC)    Pulmonary fibrosis (HCC)    Skin cancer     Past Surgical History:  Procedure Laterality Date   HERNIA REPAIR     NASAL SEPTUM SURGERY     RIGHT HEART CATH N/A 05/24/2023   Procedure: RIGHT HEART CATH;  Surgeon: Cherrie Toribio SAUNDERS, MD;  Location: [facility];  Service: Cardiovascular;  Laterality: N/A;   SKIN CANCER EXCISION      Medications: Current Outpatient Medications  Medication Sig Dispense Refill   acetaminophen  (TYLENOL ) 500 MG tablet Take 500 mg by mouth every 8 (eight) hours as needed for moderate pain (pain score 4-6).     aspirin EC 81 MG tablet Take 81 mg by mouth daily. Swallow whole.     Calcium Carbonate-Vitamin D (CALCIUM 600-VITAMIN D3 PO) Take 1 tablet by mouth daily.     cetirizine (ZYRTEC) 10 MG tablet Take 10 mg by mouth daily.     ESBRIET  801 MG TABS Take 1 tablet (801 mg total) by mouth 3 (three) times daily with meals. 270 tablet 2   fluorouracil (EFUDEX) 5 % cream Apply 1 Application topically 2 (two) times daily as needed (irritation).     fluticasone (FLONASE) 50 MCG/ACT nasal spray Place 2 sprays into both nostrils daily.     gabapentin (NEURONTIN) 300 MG capsule Take 900 mg by mouth 3 (three) times daily.     ipratropium (ATROVENT) 0.06 % nasal spray Place 2 sprays into both nostrils 3 (three) times daily.     loperamide (IMODIUM) 2 MG capsule Take 1 mg  by mouth daily.     meclizine (ANTIVERT) 25 MG tablet Take 25 mg by mouth 2 (two) times daily as needed for dizziness.     Misc Natural Products (ELDERBERRY IMMUNE COMPLEX PO) Take 2 tablets by mouth daily.     Multiple Vitamin (MULTIVITAMIN) tablet Take 1 tablet by mouth daily.     omeprazole (PRILOSEC) 40 MG capsule Take 40 mg by mouth daily.     PARoxetine (PAXIL) 20 MG tablet Take 20 mg by mouth daily.     rosuvastatin (CRESTOR) 5 MG tablet Take 5 mg by mouth daily.     sildenafil (REVATIO) 20 MG tablet Take 20 mg by mouth 3 (three) times daily.     SUMAtriptan (IMITREX) 100 MG tablet Take 100 mg by mouth every 2 (two) hours as needed for migraine.     No current facility-administered medications for this visit.    Allergies  Allergen Reactions   Bee Venom Rash   Shellfish Allergy  Hives   Nintedanib Other (See Comments)    SIRS, fevers   Pseudoephedrine Anxiety    Diagnoses:  Major depressive disorder, recurrent, moderate, irritability Plan of Care: I will meet with the patient every 2 to 3 weeks in person Treatment plan: We will use cognitive behavioral therapy, even though some therapy as well as elements of dialectical behavior therapy to help the patient reduce his anxiety by at least 50% with a target date of  October 30th 2024.  Goals will be to help the patient better manage anxiety symptoms and stress, identify causes for anxiety including changes in his health and ways to lower the anxiety, resolve the conflicts contributing to anxiety and help him manage thoughts and worrisome thinking contributing to his anxiety.  Interventions include providing education about anxiety to help him understand its causes symptoms and triggers..  We will facilitate problem solution skills as well as coping skills to help him manage anxiety and stress symptoms.  We will use cognitive behavioral therapy to identify and change anxiety provoking thought and behavior patterns as well as dialectical  therapy to teach distress tolerance and mindfulness skills. Progress: 35% We will continue with treatment goals as listed above with a new target date of November 15, 2023                                  Lorrene CHRISTELLA Hasten, [facility]                  Lorrene CHRISTELLA Hasten, [facility]               Lorrene CHRISTELLA Hasten, [facility]               Lorrene CHRISTELLA Hasten, [facility]               Lorrene CHRISTELLA Hasten, [facility]               Lorrene CHRISTELLA Hasten, [facility]               Lorrene CHRISTELLA Hasten, [facility]               Lorrene CHRISTELLA Hasten, [facility]               Lorrene CHRISTELLA Hasten, [facility]               Lorrene CHRISTELLA Hasten, [facility]               Lorrene CHRISTELLA Hasten, [facility]               Lorrene CHRISTELLA Hasten, [facility]               Lorrene CHRISTELLA Hasten, [facility]               Lorrene CHRISTELLA Hasten, [facility] Counselor Initial Adult Exam  Name: Dustin Dean Date: 08/26/2023 MRN: [id number] DOB: [birth year] PCP: Willian Righter, MD  Time spent:     spent in person with the patient in the outpatient therapist office.  He does contract for safety having no thoughts of hurting himself or anyone else.   Guardian/Payee: Self  Paperwork requested: Yes   Reason for Visit /Presenting Problem: Depression  . He does contract for safety having no thoughts of hurting himself or anyone else. Mental Status Exam: Appearance:   Fairly Groomed     Behavior:  Appropriate  Motor:  Normal  Speech/Language:   Clear and Coherent and Normal Rate  Affect:  Appropriate  Mood:  normal  Thought process:  normal  Thought content:    WNL  Sensory/Perceptual disturbances:    WNL  Orientation:  oriented to person, place, time/date, situation, and day of week  Attention:  Good  Concentration:  Good  Memory:  WNL  Fund of knowledge:   Good  Insight:    Good  Judgment:   Good  Impulse  Control:  Good     Reported Symptoms: Depression  Risk Assessment: Danger to Self:  No Self-injurious Behavior: No Danger to Others: No Duty to Warn:no Physical Aggression / Violence:No  Access to Firearms a concern: No  Gang Involvement:No  Patient / guardian was educated about steps to take if suicide or homicide risk level increases between visits: n/a While future psychiatric events cannot be accurately predicted, the patient does not currently require acute inpatient psychiatric care and does not currently meet [place]  involuntary commitment criteria.  Substance Abuse History: Current substance abuse: No     Past Psychiatric History:   Previous psychological history is significant for depression Outpatient Providers: Primary care physician History of Psych Hospitalization: None reported Psychological Testing: n/a   Abuse History:  Victim of: No., None reported   Report needed: No. Victim of Neglect:No. Perpetrator of n/a Witness / Exposure to Domestic Violence: None reported  Protective Services Involvement: No  Witness to [employer] Violence:  No   Family History:  Family History  Problem Relation Age of Onset   Cancer Mother    Congestive Heart Failure Father    Prostate cancer Father    Healthy Sister     Living situation: the patient lives with their spouse  Sexual Orientation: Straight  Relationship Status: married  Name of spouse / other: Did not discuss If a parent, number of children / ages: Adults  Support Systems: spouse  Financial Stress:  No   Income/Employment/Disability: Doctor, general practice: No   Educational History: Education: high school diploma/GED  Religion/Sprituality/World View: Did not discuss  Any cultural differences that may affect / interfere with treatment:  not applicable   Recreation/Hobbies: Time with family  Stressors: Health problems    Strengths: Supportive Relationships,  Hopefulness, Self Advocate, and Able to Communicate Effectively  Barriers:     Legal History: Pending legal issue / charges: The patient has no significant history of legal issues. History of legal issue / charges: Not applicable  Medical History/Surgical History: reviewed Past Medical History:  Diagnosis Date   Anxiety    Aortic stenosis    Flu    Pneumonia    Prostate cancer (HCC)    Pulmonary fibrosis (HCC)    Skin cancer     Past Surgical History:  Procedure Laterality Date   HERNIA REPAIR     NASAL SEPTUM SURGERY     RIGHT HEART CATH N/A 05/24/2023   Procedure: RIGHT HEART CATH;  Surgeon: Cherrie Toribio SAUNDERS, MD;  Location: [facility];  Service: Cardiovascular;  Laterality: N/A;   SKIN CANCER EXCISION      Medications: Current Outpatient Medications  Medication Sig Dispense Refill   acetaminophen (TYLENOL) 500 MG tablet Take 500 mg by mouth every 8 (eight) hours as needed for moderate pain (pain score 4-6).     aspirin EC 81 MG tablet Take 81 mg by mouth daily. Swallow whole.     Calcium Carbonate-Vitamin D (CALCIUM 600-VITAMIN  D3 PO) Take 1 tablet by mouth daily.     cetirizine (ZYRTEC) 10 MG tablet Take 10 mg by mouth daily.     ESBRIET  801 MG TABS Take 1 tablet (801 mg total) by mouth 3 (three) times daily with meals. 270 tablet 2   fluorouracil (EFUDEX) 5 % cream Apply 1 Application topically 2 (two) times daily as needed (irritation).     fluticasone (FLONASE) 50 MCG/ACT nasal spray Place 2 sprays into both nostrils daily.     gabapentin (NEURONTIN) 300 MG capsule Take 900 mg by mouth 3 (three) times daily.     ipratropium (ATROVENT) 0.06 % nasal spray Place 2 sprays into both nostrils 3 (three) times daily.     loperamide (IMODIUM) 2 MG capsule Take 1 mg by mouth daily.     meclizine (ANTIVERT) 25 MG tablet Take 25 mg by mouth 2 (two) times daily as needed for dizziness.     Misc Natural Products (ELDERBERRY IMMUNE COMPLEX PO) Take 2 tablets by mouth daily.      Multiple Vitamin (MULTIVITAMIN) tablet Take 1 tablet by mouth daily.     omeprazole (PRILOSEC) 40 MG capsule Take 40 mg by mouth daily.     PARoxetine (PAXIL) 20 MG tablet Take 20 mg by mouth daily.     rosuvastatin (CRESTOR) 5 MG tablet Take 5 mg by mouth daily.     sildenafil (REVATIO) 20 MG tablet Take 20 mg by mouth 3 (three) times daily.     SUMAtriptan (IMITREX) 100 MG tablet Take 100 mg by mouth every 2 (two) hours as needed for migraine.     No current facility-administered medications for this visit.    Allergies  Allergen Reactions   Bee Venom Rash   Shellfish Allergy  Hives   Nintedanib Other (See Comments)    SIRS, fevers   Pseudoephedrine Anxiety    Diagnoses:  Major depressive disorder, recurrent, moderate, irritability Plan of Care: I will meet with the patient every 2 to 3 weeks in person Treatment plan: We will use cognitive behavioral therapy, even though some therapy as well as elements of dialectical behavior therapy to help the patient reduce his anxiety by at least 50% with a target date of  October 30th 2024.  Goals will be to help the patient better manage anxiety symptoms and stress, identify causes for anxiety including changes in his health and ways to lower the anxiety, resolve the conflicts contributing to anxiety and help him manage thoughts and worrisome thinking contributing to his anxiety.  Interventions include providing education about anxiety to help him understand its causes symptoms and triggers..  We will facilitate problem solution skills as well as coping skills to help him manage anxiety and stress symptoms.  We will use cognitive behavioral therapy to identify and change anxiety provoking thought and behavior patterns as well as dialectical therapy to teach distress tolerance and mindfulness skills. Progress: 35% We will continue with treatment goals as listed above with a new target date of October 31st  2025                                  Lorrene CHRISTELLA Hasten, [facility]                  Lorrene CHRISTELLA Hasten, [facility]               Lorrene CHRISTELLA Hasten, [facility]  Lorrene CHRISTELLA Hasten, [facility]               Lorrene CHRISTELLA Hasten, [facility]               Lorrene CHRISTELLA Hasten, [facility]               Lorrene CHRISTELLA Hasten, [facility]               Lorrene CHRISTELLA Hasten, [facility]               Lorrene CHRISTELLA Hasten, [facility]               Lorrene CHRISTELLA Hasten, [facility]               Lorrene CHRISTELLA Hasten, [facility]               Lorrene CHRISTELLA Hasten, [facility]               Lorrene CHRISTELLA Hasten, [facility]               Lorrene CHRISTELLA Hasten, [facility]               Lorrene CHRISTELLA Hasten, [facility]               Lorrene CHRISTELLA Hasten, [facility]               Lorrene CHRISTELLA Hasten, [facility]               Lorrene CHRISTELLA Hasten, [facility]            Lorrene CHRISTELLA Hasten, [facility]               Lorrene CHRISTELLA Hasten, [facility]               Lorrene CHRISTELLA Hasten, [facility]

## 2023-08-29 NOTE — Telephone Encounter (Signed)
 Dustin Dean

## 2023-09-17 ENCOUNTER — Encounter: Payer: Self-pay | Admitting: Behavioral Health

## 2023-09-17 ENCOUNTER — Ambulatory Visit (INDEPENDENT_AMBULATORY_CARE_PROVIDER_SITE_OTHER): Admitting: Behavioral Health

## 2023-09-17 DIAGNOSIS — R454 Irritability and anger: Secondary | ICD-10-CM | POA: Diagnosis not present

## 2023-09-17 DIAGNOSIS — F331 Major depressive disorder, recurrent, moderate: Secondary | ICD-10-CM

## 2023-09-17 DIAGNOSIS — F411 Generalized anxiety disorder: Secondary | ICD-10-CM

## 2023-09-17 NOTE — Progress Notes (Signed)
 [facility] Counselor Initial Adult Exam  Name: Dustin Dean Date: 09/17/2023 MRN: [id number] DOB: 1950/12/20 PCP: Willian Righter, MD  Time spent: 2 PM until 2:54 PM, 54 minutes spent in person with the patient in the outpatient therapist office.  The patient is starting to notice some changes physically.  More difficult to breathe and he gets winded more easily.  When he starts coughing he has to sit down to catch his breath.  He is starting to become more irritated with the cannula that he has to wear in his nose to breathe.  He said he expected some of these changes and for the most part feels that he is handling those well.  He did want to talk about some things and communication with his wife.  He recognizes the difference in their upbringing and how their parents handle things and can see some of both in particular with both he and his wife.  He feels like he has tried to handle it in a very calm way and even tried to redirect it but he still feels that she holds onto things or feels that he does not care about her because he forgets to do certain things.  Encouraged him to point out all the things that he does do for her show his love and care for her as well as addressing it from her need for both of them giving each other grace so that they do not snip at each other.  He does contract for safety having no thoughts of hurting himself or anyone else.   Guardian/Payee: Self  Paperwork requested: Yes   Reason for Visit /Presenting Problem: Depression  . He does contract for safety having no thoughts of hurting himself or anyone else. Mental Status Exam: Appearance:   Fairly Groomed     Behavior:  Appropriate  Motor:  Normal  Speech/Language:   Clear and Coherent and Normal Rate  Affect:  Appropriate  Mood:  normal  Thought process:  normal  Thought content:    WNL  Sensory/Perceptual disturbances:    WNL  Orientation:  oriented to person, place, time/date,  situation, and day of week  Attention:  Good  Concentration:  Good  Memory:  WNL  Fund of knowledge:   Good  Insight:    Good  Judgment:   Good  Impulse Control:  Good     Reported Symptoms: Depression  Risk Assessment: Danger to Self:  No Self-injurious Behavior: No Danger to Others: No Duty to Warn:no Physical Aggression / Violence:No  Access to Firearms a concern: No  Gang Involvement:No  Patient / guardian was educated about steps to take if suicide or homicide risk level increases between visits: n/a While future psychiatric events cannot be accurately predicted, the patient does not currently require acute inpatient psychiatric care and does not currently meet [place]  involuntary commitment criteria.  Substance Abuse History: Current substance abuse: No     Past Psychiatric History:   Previous psychological history is significant for depression Outpatient Providers: Primary care physician History of Psych Hospitalization: None reported Psychological Testing: n/a   Abuse History:  Victim of: No., None reported   Report needed: No. Victim of Neglect:No. Perpetrator of n/a Witness / Exposure to Domestic Violence: None reported  Protective Services Involvement: No  Witness to [employer] Violence:  No   Family History:  Family History  Problem Relation Age of Onset   Cancer Mother    Congestive Heart Failure Father    Prostate  cancer Father    Healthy Sister     Living situation: the patient lives with their spouse  Sexual Orientation: Straight  Relationship Status: married  Name of spouse / other: Did not discuss If a parent, number of children / ages: Adults  Support Systems: spouse  Financial Stress:  No   Income/Employment/Disability: Doctor, general practice: No   Educational History: Education: high school diploma/GED  Religion/Sprituality/World View: Did not discuss  Any cultural differences that may affect /  interfere with treatment:  not applicable   Recreation/Hobbies: Time with family  Stressors: Health problems    Strengths: Supportive Relationships, Hopefulness, Self Advocate, and Able to Communicate Effectively  Barriers:     Legal History: Pending legal issue / charges: The patient has no significant history of legal issues. History of legal issue / charges: Not applicable  Medical History/Surgical History: reviewed Past Medical History:  Diagnosis Date   Anxiety    Aortic stenosis    Flu    Pneumonia    Prostate cancer (HCC)    Pulmonary fibrosis (HCC)    Skin cancer     Past Surgical History:  Procedure Laterality Date   HERNIA REPAIR     NASAL SEPTUM SURGERY     RIGHT HEART CATH N/A 05/24/2023   Procedure: RIGHT HEART CATH;  Surgeon: Cherrie Toribio SAUNDERS, MD;  Location: [facility];  Service: Cardiovascular;  Laterality: N/A;   SKIN CANCER EXCISION      Medications: Current Outpatient Medications  Medication Sig Dispense Refill   acetaminophen  (TYLENOL ) 500 MG tablet Take 500 mg by mouth every 8 (eight) hours as needed for moderate pain (pain score 4-6).     aspirin EC 81 MG tablet Take 81 mg by mouth daily. Swallow whole.     Calcium Carbonate-Vitamin D (CALCIUM 600-VITAMIN D3 PO) Take 1 tablet by mouth daily.     cetirizine (ZYRTEC) 10 MG tablet Take 10 mg by mouth daily.     ESBRIET  801 MG TABS Take 1 tablet (801 mg total) by mouth 3 (three) times daily with meals. 270 tablet 2   fluorouracil (EFUDEX) 5 % cream Apply 1 Application topically 2 (two) times daily as needed (irritation).     fluticasone (FLONASE) 50 MCG/ACT nasal spray Place 2 sprays into both nostrils daily.     gabapentin (NEURONTIN) 300 MG capsule Take 900 mg by mouth 3 (three) times daily.     ipratropium (ATROVENT) 0.06 % nasal spray Place 2 sprays into both nostrils 3 (three) times daily.     loperamide (IMODIUM) 2 MG capsule Take 1 mg by mouth daily.     meclizine (ANTIVERT) 25 MG  tablet Take 25 mg by mouth 2 (two) times daily as needed for dizziness.     Misc Natural Products (ELDERBERRY IMMUNE COMPLEX PO) Take 2 tablets by mouth daily.     Multiple Vitamin (MULTIVITAMIN) tablet Take 1 tablet by mouth daily.     omeprazole (PRILOSEC) 40 MG capsule Take 40 mg by mouth daily.     PARoxetine (PAXIL) 20 MG tablet Take 20 mg by mouth daily.     rosuvastatin (CRESTOR) 5 MG tablet Take 5 mg by mouth daily.     sildenafil (REVATIO) 20 MG tablet Take 20 mg by mouth 3 (three) times daily.     SUMAtriptan (IMITREX) 100 MG tablet Take 100 mg by mouth every 2 (two) hours as needed for migraine.     No current facility-administered medications for this  visit.    Allergies  Allergen Reactions   Bee Venom Rash   Shellfish Allergy  Hives   Nintedanib Other (See Comments)    SIRS, fevers   Pseudoephedrine Anxiety    Diagnoses:  Major depressive disorder, recurrent, moderate, irritability Plan of Care: I will meet with the patient every 2 to 3 weeks in person Treatment plan: We will use cognitive behavioral therapy, even though some therapy as well as elements of dialectical behavior therapy to help the patient reduce his anxiety by at least 50% with a target date of  October 30th 2024.  Goals will be to help the patient better manage anxiety symptoms and stress, identify causes for anxiety including changes in his health and ways to lower the anxiety, resolve the conflicts contributing to anxiety and help him manage thoughts and worrisome thinking contributing to his anxiety.  Interventions include providing education about anxiety to help him understand its causes symptoms and triggers..  We will facilitate problem solution skills as well as coping skills to help him manage anxiety and stress symptoms.  We will use cognitive behavioral therapy to identify and change anxiety provoking thought and behavior patterns as well as dialectical therapy to teach distress tolerance and  mindfulness skills. Progress: 40% We will continue with treatment goals as listed above with a new target date of November 15, 2023                                  Lorrene CHRISTELLA Hasten, [facility]                  Lorrene CHRISTELLA Hasten, [facility]               Lorrene CHRISTELLA Hasten, [facility]               Lorrene CHRISTELLA Hasten, [facility]               Lorrene CHRISTELLA Hasten, [facility]               Lorrene CHRISTELLA Hasten, [facility]               Lorrene CHRISTELLA Hasten, [facility]               Lorrene CHRISTELLA Hasten, [facility]               Lorrene CHRISTELLA Hasten, [facility]               Lorrene CHRISTELLA Hasten, [facility]               Lorrene CHRISTELLA Hasten, [facility]               Lorrene CHRISTELLA Hasten, [facility]               Lorrene CHRISTELLA Hasten, [facility]               Lorrene CHRISTELLA Hasten, [facility] Counselor Initial Adult Exam  Name: Kealan Buchan Date: 09/17/2023 MRN: [id number] DOB: Aug 31, 1950 PCP: Willian Righter, MD  Time spent:     spent in person with the patient in the outpatient therapist office.  He does contract for safety having no thoughts of hurting himself or anyone else.   Guardian/Payee: Self  Paperwork requested: Yes   Reason for Visit /Presenting Problem: Depression  . He does contract for safety having no thoughts of hurting himself or anyone else. Mental Status Exam: Appearance:   Fairly  Groomed     Behavior:  Appropriate  Motor:  Normal  Speech/Language:   Clear and Coherent and Normal Rate  Affect:  Appropriate  Mood:  normal  Thought process:  normal  Thought content:    WNL  Sensory/Perceptual disturbances:    WNL  Orientation:  oriented to person, place, time/date, situation, and day of week  Attention:  Good  Concentration:  Good  Memory:  WNL  Fund of knowledge:   Good  Insight:    Good  Judgment:   Good  Impulse Control:  Good     Reported  Symptoms: Depression  Risk Assessment: Danger to Self:  No Self-injurious Behavior: No Danger to Others: No Duty to Warn:no Physical Aggression / Violence:No  Access to Firearms a concern: No  Gang Involvement:No  Patient / guardian was educated about steps to take if suicide or homicide risk level increases between visits: n/a While future psychiatric events cannot be accurately predicted, the patient does not currently require acute inpatient psychiatric care and does not currently meet [place]  involuntary commitment criteria.  Substance Abuse History: Current substance abuse: No     Past Psychiatric History:   Previous psychological history is significant for depression Outpatient Providers: Primary care physician History of Psych Hospitalization: None reported Psychological Testing: n/a   Abuse History:  Victim of: No., None reported   Report needed: No. Victim of Neglect:No. Perpetrator of n/a Witness / Exposure to Domestic Violence: None reported  Protective Services Involvement: No  Witness to [employer] Violence:  No   Family History:  Family History  Problem Relation Age of Onset   Cancer Mother    Congestive Heart Failure Father    Prostate cancer Father    Healthy Sister     Living situation: the patient lives with their spouse  Sexual Orientation: Straight  Relationship Status: married  Name of spouse / other: Did not discuss If a parent, number of children / ages: Adults  Support Systems: spouse  Financial Stress:  No   Income/Employment/Disability: Doctor, general practice: No   Educational History: Education: high school diploma/GED  Religion/Sprituality/World View: Did not discuss  Any cultural differences that may affect / interfere with treatment:  not applicable   Recreation/Hobbies: Time with family  Stressors: Health problems    Strengths: Supportive Relationships, Hopefulness, Self Advocate, and Able  to Communicate Effectively  Barriers:     Legal History: Pending legal issue / charges: The patient has no significant history of legal issues. History of legal issue / charges: Not applicable  Medical History/Surgical History: reviewed Past Medical History:  Diagnosis Date   Anxiety    Aortic stenosis    Flu    Pneumonia    Prostate cancer (HCC)    Pulmonary fibrosis (HCC)    Skin cancer     Past Surgical History:  Procedure Laterality Date   HERNIA REPAIR     NASAL SEPTUM SURGERY     RIGHT HEART CATH N/A 05/24/2023   Procedure: RIGHT HEART CATH;  Surgeon: Cherrie Toribio SAUNDERS, MD;  Location: [facility];  Service: Cardiovascular;  Laterality: N/A;   SKIN CANCER EXCISION      Medications: Current Outpatient Medications  Medication Sig Dispense Refill   acetaminophen  (TYLENOL ) 500 MG tablet Take 500 mg by mouth every 8 (eight) hours as needed for moderate pain (pain score 4-6).     aspirin EC 81 MG tablet Take 81 mg by mouth daily. Swallow whole.  Calcium Carbonate-Vitamin D (CALCIUM 600-VITAMIN D3 PO) Take 1 tablet by mouth daily.     cetirizine (ZYRTEC) 10 MG tablet Take 10 mg by mouth daily.     ESBRIET  801 MG TABS Take 1 tablet (801 mg total) by mouth 3 (three) times daily with meals. 270 tablet 2   fluorouracil (EFUDEX) 5 % cream Apply 1 Application topically 2 (two) times daily as needed (irritation).     fluticasone (FLONASE) 50 MCG/ACT nasal spray Place 2 sprays into both nostrils daily.     gabapentin (NEURONTIN) 300 MG capsule Take 900 mg by mouth 3 (three) times daily.     ipratropium (ATROVENT) 0.06 % nasal spray Place 2 sprays into both nostrils 3 (three) times daily.     loperamide (IMODIUM) 2 MG capsule Take 1 mg by mouth daily.     meclizine (ANTIVERT) 25 MG tablet Take 25 mg by mouth 2 (two) times daily as needed for dizziness.     Misc Natural Products (ELDERBERRY IMMUNE COMPLEX PO) Take 2 tablets by mouth daily.     Multiple Vitamin (MULTIVITAMIN)  tablet Take 1 tablet by mouth daily.     omeprazole (PRILOSEC) 40 MG capsule Take 40 mg by mouth daily.     PARoxetine (PAXIL) 20 MG tablet Take 20 mg by mouth daily.     rosuvastatin (CRESTOR) 5 MG tablet Take 5 mg by mouth daily.     sildenafil (REVATIO) 20 MG tablet Take 20 mg by mouth 3 (three) times daily.     SUMAtriptan (IMITREX) 100 MG tablet Take 100 mg by mouth every 2 (two) hours as needed for migraine.     No current facility-administered medications for this visit.    Allergies  Allergen Reactions   Bee Venom Rash   Shellfish Allergy  Hives   Nintedanib Other (See Comments)    SIRS, fevers   Pseudoephedrine Anxiety    Diagnoses:  Major depressive disorder, recurrent, moderate, irritability Plan of Care: I will meet with the patient every 2 to 3 weeks in person Treatment plan: We will use cognitive behavioral therapy, even though some therapy as well as elements of dialectical behavior therapy to help the patient reduce his anxiety by at least 50% with a target date of  October 30th 2024.  Goals will be to help the patient better manage anxiety symptoms and stress, identify causes for anxiety including changes in his health and ways to lower the anxiety, resolve the conflicts contributing to anxiety and help him manage thoughts and worrisome thinking contributing to his anxiety.  Interventions include providing education about anxiety to help him understand its causes symptoms and triggers..  We will facilitate problem solution skills as well as coping skills to help him manage anxiety and stress symptoms.  We will use cognitive behavioral therapy to identify and change anxiety provoking thought and behavior patterns as well as dialectical therapy to teach distress tolerance and mindfulness skills. Progress: 35% We will continue with treatment goals as listed above with a new target date of October 31st 2025                                  Lorrene CHRISTELLA Hasten, [facility]                  Lorrene CHRISTELLA Hasten, [facility]               Lorrene CHRISTELLA Hasten, [facility]  Lorrene CHRISTELLA Hasten, [facility]               Lorrene CHRISTELLA Hasten, [facility]               Lorrene CHRISTELLA Hasten, [facility]               Lorrene CHRISTELLA Hasten, [facility]               Lorrene CHRISTELLA Hasten, [facility]               Lorrene CHRISTELLA Hasten, [facility]               Lorrene CHRISTELLA Hasten, [facility]               Lorrene CHRISTELLA Hasten, [facility]               Lorrene CHRISTELLA Hasten, [facility]               Lorrene CHRISTELLA Hasten, [facility]               Lorrene CHRISTELLA Hasten, [facility]               Lorrene CHRISTELLA Hasten, [facility]               Lorrene CHRISTELLA Hasten, [facility]               Lorrene CHRISTELLA Hasten, [facility]               Lorrene CHRISTELLA Hasten, [facility]            Lorrene CHRISTELLA Hasten, [facility]               Lorrene CHRISTELLA Hasten, [facility]               Lorrene CHRISTELLA Hasten, [facility]               Lorrene CHRISTELLA Hasten, [facility]

## 2023-10-07 ENCOUNTER — Encounter: Payer: Self-pay | Admitting: Behavioral Health

## 2023-10-07 ENCOUNTER — Ambulatory Visit: Admitting: Behavioral Health

## 2023-10-29 ENCOUNTER — Ambulatory Visit: Admitting: Behavioral Health

## 2023-10-29 ENCOUNTER — Encounter: Payer: Self-pay | Admitting: Behavioral Health

## 2023-10-29 DIAGNOSIS — F331 Major depressive disorder, recurrent, moderate: Secondary | ICD-10-CM

## 2023-10-29 DIAGNOSIS — F411 Generalized anxiety disorder: Secondary | ICD-10-CM

## 2023-10-29 DIAGNOSIS — R454 Irritability and anger: Secondary | ICD-10-CM | POA: Diagnosis not present

## 2023-10-29 DIAGNOSIS — F330 Major depressive disorder, recurrent, mild: Secondary | ICD-10-CM

## 2023-10-29 NOTE — Progress Notes (Signed)
 [facility] Counselor Initial Adult Exam  Name: Dustin Dean Date: 10/29/2023 MRN: [id number] DOB: 1950-04-06 PCP: Willian Righter, MD  Time spent: 3:02 PM until 3:56 pm, 54 minutes spent in person with the patient in the outpatient therapist office.  The patient is starting to notice some changes physically.  He still reports many more good days and bad days since that even the bed based on horrible.  On the good days he says he gets up and feels pretty well gets a fair amount done at his own pace and usually stays up until he is ready to go to bed and sleeps well.  On the bad days he says he wakes up feeling somewhat foggy headed and does not have as much energy but is still able to get some things done during the day.  This is what for being supportive of him and is recognizing more when he is not feeling as well.  He still is remaining very positive but says that he has been thinking more about past things and he shared 1 thing about his childhood.  He said he was not 1 that was very athletic and wanted to play sports and his father could be fairly.  He said since that he was always felt a discomfort with playing in sports in school but also even in competitive type things which were not always athletic in nature because he felt embarrassed about of not being athletic.  We talked through his feelings about that which he says he has gotten better about but recognizes that at this stage in his life he is more reflective.  We will start to meet every 2 weeks again and encouraged him to continue to be positive and kind to himself use coping skills for anxiety and depression.  He does contract for safety having no thoughts of hurting himself or anyone else.   Guardian/Payee: Self  Paperwork requested: Yes   Reason for Visit /Presenting Problem: Depression  . He does contract for safety having no thoughts of hurting himself or anyone else. Mental Status Exam: Appearance:   Fairly  Groomed     Behavior:  Appropriate  Motor:  Normal  Speech/Language:   Clear and Coherent and Normal Rate  Affect:  Appropriate  Mood:  normal  Thought process:  normal  Thought content:    WNL  Sensory/Perceptual disturbances:    WNL  Orientation:  oriented to person, place, time/date, situation, and day of week  Attention:  Good  Concentration:  Good  Memory:  WNL  Fund of knowledge:   Good  Insight:    Good  Judgment:   Good  Impulse Control:  Good     Reported Symptoms: Depression  Risk Assessment: Danger to Self:  No Self-injurious Behavior: No Danger to Others: No Duty to Warn:no Physical Aggression / Violence:No  Access to Firearms a concern: No  Gang Involvement:No  Patient / guardian was educated about steps to take if suicide or homicide risk level increases between visits: n/a While future psychiatric events cannot be accurately predicted, the patient does not currently require acute inpatient psychiatric care and does not currently meet [place]  involuntary commitment criteria.  Substance Abuse History: Current substance abuse: No     Past Psychiatric History:   Previous psychological history is significant for depression Outpatient Providers: Primary care physician History of Psych Hospitalization: None reported Psychological Testing: n/a   Abuse History:  Victim of: No., None reported   Report needed: No.  Victim of Neglect:No. Perpetrator of n/a Witness / Exposure to Domestic Violence: None reported  Protective Services Involvement: No  Witness to [employer] Violence:  No   Family History:  Family History  Problem Relation Age of Onset   Cancer Mother    Congestive Heart Failure Father    Prostate cancer Father    Healthy Sister     Living situation: the patient lives with their spouse  Sexual Orientation: Straight  Relationship Status: married  Name of spouse / other: Did not discuss If a parent, number of children / ages:  Adults  Support Systems: spouse  Financial Stress:  No   Income/Employment/Disability: Doctor, general practice: No   Educational History: Education: high school diploma/GED  Religion/Sprituality/World View: Did not discuss  Any cultural differences that may affect / interfere with treatment:  not applicable   Recreation/Hobbies: Time with family  Stressors: Health problems    Strengths: Supportive Relationships, Hopefulness, Self Advocate, and Able to Communicate Effectively  Barriers:     Legal History: Pending legal issue / charges: The patient has no significant history of legal issues. History of legal issue / charges: Not applicable  Medical History/Surgical History: reviewed Past Medical History:  Diagnosis Date   Anxiety    Aortic stenosis    Flu    Pneumonia    Prostate cancer (HCC)    Pulmonary fibrosis (HCC)    Skin cancer     Past Surgical History:  Procedure Laterality Date   HERNIA REPAIR     NASAL SEPTUM SURGERY     RIGHT HEART CATH N/A 05/24/2023   Procedure: RIGHT HEART CATH;  Surgeon: Cherrie Toribio SAUNDERS, MD;  Location: [facility];  Service: Cardiovascular;  Laterality: N/A;   SKIN CANCER EXCISION      Medications: Current Outpatient Medications  Medication Sig Dispense Refill   acetaminophen  (TYLENOL ) 500 MG tablet Take 500 mg by mouth every 8 (eight) hours as needed for moderate pain (pain score 4-6).     aspirin EC 81 MG tablet Take 81 mg by mouth daily. Swallow whole.     Calcium Carbonate-Vitamin D (CALCIUM 600-VITAMIN D3 PO) Take 1 tablet by mouth daily.     cetirizine (ZYRTEC) 10 MG tablet Take 10 mg by mouth daily.     ESBRIET  801 MG TABS Take 1 tablet (801 mg total) by mouth 3 (three) times daily with meals. 270 tablet 2   fluorouracil (EFUDEX) 5 % cream Apply 1 Application topically 2 (two) times daily as needed (irritation).     fluticasone (FLONASE) 50 MCG/ACT nasal spray Place 2 sprays into both  nostrils daily.     gabapentin (NEURONTIN) 300 MG capsule Take 900 mg by mouth 3 (three) times daily.     ipratropium (ATROVENT) 0.06 % nasal spray Place 2 sprays into both nostrils 3 (three) times daily.     loperamide (IMODIUM) 2 MG capsule Take 1 mg by mouth daily.     meclizine (ANTIVERT) 25 MG tablet Take 25 mg by mouth 2 (two) times daily as needed for dizziness.     Misc Natural Products (ELDERBERRY IMMUNE COMPLEX PO) Take 2 tablets by mouth daily.     Multiple Vitamin (MULTIVITAMIN) tablet Take 1 tablet by mouth daily.     omeprazole (PRILOSEC) 40 MG capsule Take 40 mg by mouth daily.     PARoxetine (PAXIL) 20 MG tablet Take 20 mg by mouth daily.     rosuvastatin (CRESTOR) 5 MG tablet Take 5  mg by mouth daily.     sildenafil (REVATIO) 20 MG tablet Take 20 mg by mouth 3 (three) times daily.     SUMAtriptan (IMITREX) 100 MG tablet Take 100 mg by mouth every 2 (two) hours as needed for migraine.     No current facility-administered medications for this visit.    Allergies  Allergen Reactions   Bee Venom Rash   Shellfish Allergy  Hives   Nintedanib Other (See Comments)    SIRS, fevers   Pseudoephedrine Anxiety    Diagnoses:  Major depressive disorder, recurrent, moderate, irritability Plan of Care: I will meet with the patient every 2 to 3 weeks in person Treatment plan: We will use cognitive behavioral therapy, even though some therapy as well as elements of dialectical behavior therapy to help the patient reduce his anxiety by at least 50% with a target date of  October 30th 2024.  Goals will be to help the patient better manage anxiety symptoms and stress, identify causes for anxiety including changes in his health and ways to lower the anxiety, resolve the conflicts contributing to anxiety and help him manage thoughts and worrisome thinking contributing to his anxiety.  Interventions include providing education about anxiety to help him understand its causes symptoms and triggers..   We will facilitate problem solution skills as well as coping skills to help him manage anxiety and stress symptoms.  We will use cognitive behavioral therapy to identify and change anxiety provoking thought and behavior patterns as well as dialectical therapy to teach distress tolerance and mindfulness skills. Progress: 45% We will continue with treatment goals as listed above with a new target date of May 14, 2024                                  Lorrene CHRISTELLA Hasten, [facility]                  Lorrene CHRISTELLA Hasten, [facility]               Lorrene CHRISTELLA Hasten, [facility]               Lorrene CHRISTELLA Hasten, [facility]               Lorrene CHRISTELLA Hasten, [facility]               Lorrene CHRISTELLA Hasten, [facility]               Lorrene CHRISTELLA Hasten, [facility]               Lorrene CHRISTELLA Hasten, [facility]               Lorrene CHRISTELLA Hasten, [facility]               Lorrene CHRISTELLA Hasten, [facility]               Lorrene CHRISTELLA Hasten, [facility]               Lorrene CHRISTELLA Hasten, [facility]               Lorrene CHRISTELLA Hasten, [facility]               Lorrene CHRISTELLA Hasten, [facility] Counselor Initial Adult Exam  Name: Dustin Dean Date: 10/29/2023 MRN: [id number] DOB: March 14, 1950 PCP: Willian Righter, MD  Time spent:     spent in person with the patient in the outpatient therapist office.  He does contract for safety having no thoughts of hurting himself or anyone else.   Guardian/Payee: Self  Paperwork requested: Yes   Reason for Visit /Presenting Problem: Depression  . He does contract for safety having no thoughts of hurting himself or anyone else. Mental Status Exam: Appearance:   Fairly Groomed     Behavior:  Appropriate  Motor:  Normal  Speech/Language:   Clear and Coherent and Normal Rate  Affect:  Appropriate  Mood:  normal  Thought process:  normal  Thought content:    WNL   Sensory/Perceptual disturbances:    WNL  Orientation:  oriented to person, place, time/date, situation, and day of week  Attention:  Good  Concentration:  Good  Memory:  WNL  Fund of knowledge:   Good  Insight:    Good  Judgment:   Good  Impulse Control:  Good     Reported Symptoms: Depression  Risk Assessment: Danger to Self:  No Self-injurious Behavior: No Danger to Others: No Duty to Warn:no Physical Aggression / Violence:No  Access to Firearms a concern: No  Gang Involvement:No  Patient / guardian was educated about steps to take if suicide or homicide risk level increases between visits: n/a While future psychiatric events cannot be accurately predicted, the patient does not currently require acute inpatient psychiatric care and does not currently meet [place]  involuntary commitment criteria.  Substance Abuse History: Current substance abuse: No     Past Psychiatric History:   Previous psychological history is significant for depression Outpatient Providers: Primary care physician History of Psych Hospitalization: None reported Psychological Testing: n/a   Abuse History:  Victim of: No., None reported   Report needed: No. Victim of Neglect:No. Perpetrator of n/a Witness / Exposure to Domestic Violence: None reported  Protective Services Involvement: No  Witness to [employer] Violence:  No   Family History:  Family History  Problem Relation Age of Onset   Cancer Mother    Congestive Heart Failure Father    Prostate cancer Father    Healthy Sister     Living situation: the patient lives with their spouse  Sexual Orientation: Straight  Relationship Status: married  Name of spouse / other: Did not discuss If a parent, number of children / ages: Adults  Support Systems: spouse  Financial Stress:  No   Income/Employment/Disability: Doctor, general practice: No   Educational History: Education: high school  diploma/GED  Religion/Sprituality/World View: Did not discuss  Any cultural differences that may affect / interfere with treatment:  not applicable   Recreation/Hobbies: Time with family  Stressors: Health problems    Strengths: Supportive Relationships, Hopefulness, Self Advocate, and Able to Communicate Effectively  Barriers:     Legal History: Pending legal issue / charges: The patient has no significant history of legal issues. History of legal issue / charges: Not applicable  Medical History/Surgical History: reviewed Past Medical History:  Diagnosis Date   Anxiety    Aortic stenosis    Flu    Pneumonia    Prostate cancer (HCC)    Pulmonary fibrosis (HCC)    Skin cancer     Past Surgical History:  Procedure Laterality Date   HERNIA REPAIR     NASAL SEPTUM SURGERY     RIGHT HEART CATH N/A 05/24/2023   Procedure: RIGHT HEART CATH;  Surgeon: Cherrie Toribio SAUNDERS, MD;  Location: [facility];  Service: Cardiovascular;  Laterality: N/A;   SKIN CANCER EXCISION  Medications: Current Outpatient Medications  Medication Sig Dispense Refill   acetaminophen  (TYLENOL ) 500 MG tablet Take 500 mg by mouth every 8 (eight) hours as needed for moderate pain (pain score 4-6).     aspirin EC 81 MG tablet Take 81 mg by mouth daily. Swallow whole.     Calcium Carbonate-Vitamin D (CALCIUM 600-VITAMIN D3 PO) Take 1 tablet by mouth daily.     cetirizine (ZYRTEC) 10 MG tablet Take 10 mg by mouth daily.     ESBRIET  801 MG TABS Take 1 tablet (801 mg total) by mouth 3 (three) times daily with meals. 270 tablet 2   fluorouracil (EFUDEX) 5 % cream Apply 1 Application topically 2 (two) times daily as needed (irritation).     fluticasone (FLONASE) 50 MCG/ACT nasal spray Place 2 sprays into both nostrils daily.     gabapentin (NEURONTIN) 300 MG capsule Take 900 mg by mouth 3 (three) times daily.     ipratropium (ATROVENT) 0.06 % nasal spray Place 2 sprays into both nostrils 3 (three)  times daily.     loperamide (IMODIUM) 2 MG capsule Take 1 mg by mouth daily.     meclizine (ANTIVERT) 25 MG tablet Take 25 mg by mouth 2 (two) times daily as needed for dizziness.     Misc Natural Products (ELDERBERRY IMMUNE COMPLEX PO) Take 2 tablets by mouth daily.     Multiple Vitamin (MULTIVITAMIN) tablet Take 1 tablet by mouth daily.     omeprazole (PRILOSEC) 40 MG capsule Take 40 mg by mouth daily.     PARoxetine (PAXIL) 20 MG tablet Take 20 mg by mouth daily.     rosuvastatin (CRESTOR) 5 MG tablet Take 5 mg by mouth daily.     sildenafil (REVATIO) 20 MG tablet Take 20 mg by mouth 3 (three) times daily.     SUMAtriptan (IMITREX) 100 MG tablet Take 100 mg by mouth every 2 (two) hours as needed for migraine.     No current facility-administered medications for this visit.    Allergies  Allergen Reactions   Bee Venom Rash   Shellfish Allergy  Hives   Nintedanib Other (See Comments)    SIRS, fevers   Pseudoephedrine Anxiety    Diagnoses:  Major depressive disorder, recurrent, moderate, irritability Plan of Care: I will meet with the patient every 2 to 3 weeks in person Treatment plan: We will use cognitive behavioral therapy, even though some therapy as well as elements of dialectical behavior therapy to help the patient reduce his anxiety by at least 50% with a target date of  October 30th 2024.  Goals will be to help the patient better manage anxiety symptoms and stress, identify causes for anxiety including changes in his health and ways to lower the anxiety, resolve the conflicts contributing to anxiety and help him manage thoughts and worrisome thinking contributing to his anxiety.  Interventions include providing education about anxiety to help him understand its causes symptoms and triggers..  We will facilitate problem solution skills as well as coping skills to help him manage anxiety and stress symptoms.  We will use cognitive behavioral therapy to identify and change anxiety  provoking thought and behavior patterns as well as dialectical therapy to teach distress tolerance and mindfulness skills. Progress: 35% We will continue with treatment goals as listed above with a new target date of October 31st 2025  Lorrene CHRISTELLA Hasten, [facility]                  Lorrene CHRISTELLA Hasten, [facility]               Lorrene CHRISTELLA Hasten, [facility]               Lorrene CHRISTELLA Hasten, [facility]               Lorrene CHRISTELLA Hasten, [facility]               Lorrene CHRISTELLA Hasten, [facility]               Lorrene CHRISTELLA Hasten, [facility]               Lorrene CHRISTELLA Hasten, [facility]               Lorrene CHRISTELLA Hasten, [facility]               Lorrene CHRISTELLA Hasten, [facility]               Lorrene CHRISTELLA Hasten, [facility]               Lorrene CHRISTELLA Hasten, [facility]               Lorrene CHRISTELLA Hasten, [facility]               Lorrene CHRISTELLA Hasten, [facility]               Lorrene CHRISTELLA Hasten, [facility]               Lorrene CHRISTELLA Hasten, [facility]               Lorrene CHRISTELLA Hasten, [facility]               Lorrene CHRISTELLA Hasten, [facility]            Lorrene CHRISTELLA Hasten, [facility]               Lorrene CHRISTELLA Hasten, [facility]               Lorrene CHRISTELLA Hasten, [facility]               Lorrene CHRISTELLA Hasten, [facility]               Lorrene CHRISTELLA Hasten, [facility]

## 2023-11-12 ENCOUNTER — Ambulatory Visit: Admitting: Behavioral Health

## 2023-11-12 ENCOUNTER — Encounter: Payer: Self-pay | Admitting: Behavioral Health

## 2023-11-12 DIAGNOSIS — R454 Irritability and anger: Secondary | ICD-10-CM

## 2023-11-12 DIAGNOSIS — F411 Generalized anxiety disorder: Secondary | ICD-10-CM

## 2023-11-12 NOTE — Progress Notes (Signed)
 [facility] Counselor Initial Adult Exam  Name: Dustin Dean Date: 11/12/2023 MRN: [id number] DOB: [birth year] PCP: Willian Righter, MD  Time spent: 12 PM until 12:56 PM, 56 minutes spent in person with the patient in the outpatient therapist office.  The patient is starting to notice some changes physically.  He reports that there are more good days and bad days and some days he has a lot of energy and does what he can and other days he knows that he just needs to take it easy.  He estimates that he is functioning at about 75% of what he would have before the diagnosis.  He still maintains a very positive attitude and says there are things that he would like to do and we talked about doing those with his neighbors or his family members such as going to a museum in [place].  The biggest difficulty has been that they had to put their Maggie's health close declining and has had to put animals to sleep before but says it is always very difficult.  We processed his grief and the impact that is at all he and his wife.  He says he does not think he will get another dog. He does contract for safety having no thoughts of hurting himself or anyone else.   Guardian/Payee: Self  Paperwork requested: Yes   Reason for Visit /Presenting Problem: Depression  . He does contract for safety having no thoughts of hurting himself or anyone else. Mental Status Exam: Appearance:   Fairly Groomed     Behavior:  Appropriate  Motor:  Normal  Speech/Language:   Clear and Coherent and Normal Rate  Affect:  Appropriate  Mood:  normal  Thought process:  normal  Thought content:    WNL  Sensory/Perceptual disturbances:    WNL  Orientation:  oriented to person, place, time/date, situation, and day of week  Attention:  Good  Concentration:  Good  Memory:  WNL  Fund of knowledge:   Good  Insight:    Good  Judgment:   Good  Impulse Control:  Good     Reported Symptoms: Depression  Risk  Assessment: Danger to Self:  No Self-injurious Behavior: No Danger to Others: No Duty to Warn:no Physical Aggression / Violence:No  Access to Firearms a concern: No  Gang Involvement:No  Patient / guardian was educated about steps to take if suicide or homicide risk level increases between visits: n/a While future psychiatric events cannot be accurately predicted, the patient does not currently require acute inpatient psychiatric care and does not currently meet [place]  involuntary commitment criteria.  Substance Abuse History: Current substance abuse: No     Past Psychiatric History:   Previous psychological history is significant for depression Outpatient Providers: Primary care physician History of Psych Hospitalization: None reported Psychological Testing: n/a   Abuse History:  Victim of: No., None reported   Report needed: No. Victim of Neglect:No. Perpetrator of n/a Witness / Exposure to Domestic Violence: None reported  Protective Services Involvement: No  Witness to [employer] Violence:  No   Family History:  Family History  Problem Relation Age of Onset   Cancer Mother    Congestive Heart Failure Father    Prostate cancer Father    Healthy Sister     Living situation: the patient lives with their spouse  Sexual Orientation: Straight  Relationship Status: married  Name of spouse / other: Did not discuss If a parent, number of children / ages: Adults  Support Systems: spouse  Surveyor, Quantity Stress:  No   Income/Employment/Disability: Doctor, General Practice: No   Educational History: Education: high school diploma/GED  Religion/Sprituality/World View: Did not discuss  Any cultural differences that may affect / interfere with treatment:  not applicable   Recreation/Hobbies: Time with family  Stressors: Health problems    Strengths: Supportive Relationships, Hopefulness, Self Advocate, and Able to Communicate  Effectively  Barriers:     Legal History: Pending legal issue / charges: The patient has no significant history of legal issues. History of legal issue / charges: Not applicable  Medical History/Surgical History: reviewed Past Medical History:  Diagnosis Date   Anxiety    Aortic stenosis    Flu    Pneumonia    Prostate cancer (HCC)    Pulmonary fibrosis (HCC)    Skin cancer     Past Surgical History:  Procedure Laterality Date   HERNIA REPAIR     NASAL SEPTUM SURGERY     RIGHT HEART CATH N/A 05/24/2023   Procedure: RIGHT HEART CATH;  Surgeon: Cherrie Toribio SAUNDERS, MD;  Location: [facility];  Service: Cardiovascular;  Laterality: N/A;   SKIN CANCER EXCISION      Medications: Current Outpatient Medications  Medication Sig Dispense Refill   acetaminophen  (TYLENOL ) 500 MG tablet Take 500 mg by mouth every 8 (eight) hours as needed for moderate pain (pain score 4-6).     aspirin EC 81 MG tablet Take 81 mg by mouth daily. Swallow whole.     Calcium Carbonate-Vitamin D (CALCIUM 600-VITAMIN D3 PO) Take 1 tablet by mouth daily.     cetirizine (ZYRTEC) 10 MG tablet Take 10 mg by mouth daily.     ESBRIET  801 MG TABS Take 1 tablet (801 mg total) by mouth 3 (three) times daily with meals. 270 tablet 2   fluorouracil (EFUDEX) 5 % cream Apply 1 Application topically 2 (two) times daily as needed (irritation).     fluticasone (FLONASE) 50 MCG/ACT nasal spray Place 2 sprays into both nostrils daily.     gabapentin (NEURONTIN) 300 MG capsule Take 900 mg by mouth 3 (three) times daily.     ipratropium (ATROVENT) 0.06 % nasal spray Place 2 sprays into both nostrils 3 (three) times daily.     loperamide (IMODIUM) 2 MG capsule Take 1 mg by mouth daily.     meclizine (ANTIVERT) 25 MG tablet Take 25 mg by mouth 2 (two) times daily as needed for dizziness.     Misc Natural Products (ELDERBERRY IMMUNE COMPLEX PO) Take 2 tablets by mouth daily.     Multiple Vitamin (MULTIVITAMIN) tablet Take 1  tablet by mouth daily.     omeprazole (PRILOSEC) 40 MG capsule Take 40 mg by mouth daily.     PARoxetine (PAXIL) 20 MG tablet Take 20 mg by mouth daily.     rosuvastatin (CRESTOR) 5 MG tablet Take 5 mg by mouth daily.     sildenafil (REVATIO) 20 MG tablet Take 20 mg by mouth 3 (three) times daily.     SUMAtriptan (IMITREX) 100 MG tablet Take 100 mg by mouth every 2 (two) hours as needed for migraine.     No current facility-administered medications for this visit.    Allergies  Allergen Reactions   Bee Venom Rash   Shellfish Allergy  Hives   Nintedanib Other (See Comments)    SIRS, fevers   Pseudoephedrine Anxiety    Diagnoses:  Major depressive disorder, recurrent, moderate, irritability Plan of  Care: I will meet with the patient every 2 to 3 weeks in person Treatment plan: We will use cognitive behavioral therapy, even though some therapy as well as elements of dialectical behavior therapy to help the patient reduce his anxiety by at least 50% with a target date of  October 30th 2024.  Goals will be to help the patient better manage anxiety symptoms and stress, identify causes for anxiety including changes in his health and ways to lower the anxiety, resolve the conflicts contributing to anxiety and help him manage thoughts and worrisome thinking contributing to his anxiety.  Interventions include providing education about anxiety to help him understand its causes symptoms and triggers..  We will facilitate problem solution skills as well as coping skills to help him manage anxiety and stress symptoms.  We will use cognitive behavioral therapy to identify and change anxiety provoking thought and behavior patterns as well as dialectical therapy to teach distress tolerance and mindfulness skills. Progress: 45% We will continue with treatment goals as listed above with a new target date of May 14, 2024                                  Lorrene CHRISTELLA Hasten,  [facility]                  Lorrene CHRISTELLA Hasten, [facility]               Lorrene CHRISTELLA Hasten, [facility]               Lorrene CHRISTELLA Hasten, [facility]               Lorrene CHRISTELLA Hasten, [facility]               Lorrene CHRISTELLA Hasten, [facility]               Lorrene CHRISTELLA Hasten, [facility]               Lorrene CHRISTELLA Hasten, [facility]               Lorrene CHRISTELLA Hasten, [facility]               Lorrene CHRISTELLA Hasten, [facility]               Lorrene CHRISTELLA Hasten, [facility]               Lorrene CHRISTELLA Hasten, [facility]               Lorrene CHRISTELLA Hasten, [facility]               Lorrene CHRISTELLA Hasten, [facility] Counselor Initial Adult Exam  Name: Dustin Dean Date: 11/12/2023 MRN: [id number] DOB: 01-10-1951 PCP: Willian Righter, MD  Time spent:     spent in person with the patient in the outpatient therapist office.  He does contract for safety having no thoughts of hurting himself or anyone else.   Guardian/Payee: Self  Paperwork requested: Yes   Reason for Visit /Presenting Problem: Depression  . He does contract for safety having no thoughts of hurting himself or anyone else. Mental Status Exam: Appearance:   Fairly Groomed     Behavior:  Appropriate  Motor:  Normal  Speech/Language:   Clear and Coherent and Normal Rate  Affect:  Appropriate  Mood:  normal  Thought process:  normal  Thought content:    WNL  Sensory/Perceptual disturbances:  WNL  Orientation:  oriented to person, place, time/date, situation, and day of week  Attention:  Good  Concentration:  Good  Memory:  WNL  Fund of knowledge:   Good  Insight:    Good  Judgment:   Good  Impulse Control:  Good     Reported Symptoms: Depression  Risk Assessment: Danger to Self:  No Self-injurious Behavior: No Danger to Others: No Duty to Warn:no Physical Aggression / Violence:No  Access to Firearms a concern: No  Gang Involvement:No   Patient / guardian was educated about steps to take if suicide or homicide risk level increases between visits: n/a While future psychiatric events cannot be accurately predicted, the patient does not currently require acute inpatient psychiatric care and does not currently meet [place]  involuntary commitment criteria.  Substance Abuse History: Current substance abuse: No     Past Psychiatric History:   Previous psychological history is significant for depression Outpatient Providers: Primary care physician History of Psych Hospitalization: None reported Psychological Testing: n/a   Abuse History:  Victim of: No., None reported   Report needed: No. Victim of Neglect:No. Perpetrator of n/a Witness / Exposure to Domestic Violence: None reported  Protective Services Involvement: No  Witness to [employer] Violence:  No   Family History:  Family History  Problem Relation Age of Onset   Cancer Mother    Congestive Heart Failure Father    Prostate cancer Father    Healthy Sister     Living situation: the patient lives with their spouse  Sexual Orientation: Straight  Relationship Status: married  Name of spouse / other: Did not discuss If a parent, number of children / ages: Adults  Support Systems: spouse  Financial Stress:  No   Income/Employment/Disability: Doctor, General Practice: No   Educational History: Education: high school diploma/GED  Religion/Sprituality/World View: Did not discuss  Any cultural differences that may affect / interfere with treatment:  not applicable   Recreation/Hobbies: Time with family  Stressors: Health problems    Strengths: Supportive Relationships, Hopefulness, Self Advocate, and Able to Communicate Effectively  Barriers:     Legal History: Pending legal issue / charges: The patient has no significant history of legal issues. History of legal issue / charges: Not applicable  Medical  History/Surgical History: reviewed Past Medical History:  Diagnosis Date   Anxiety    Aortic stenosis    Flu    Pneumonia    Prostate cancer (HCC)    Pulmonary fibrosis (HCC)    Skin cancer     Past Surgical History:  Procedure Laterality Date   HERNIA REPAIR     NASAL SEPTUM SURGERY     RIGHT HEART CATH N/A 05/24/2023   Procedure: RIGHT HEART CATH;  Surgeon: Cherrie Toribio SAUNDERS, MD;  Location: [facility];  Service: Cardiovascular;  Laterality: N/A;   SKIN CANCER EXCISION      Medications: Current Outpatient Medications  Medication Sig Dispense Refill   acetaminophen  (TYLENOL ) 500 MG tablet Take 500 mg by mouth every 8 (eight) hours as needed for moderate pain (pain score 4-6).     aspirin EC 81 MG tablet Take 81 mg by mouth daily. Swallow whole.     Calcium Carbonate-Vitamin D (CALCIUM 600-VITAMIN D3 PO) Take 1 tablet by mouth daily.     cetirizine (ZYRTEC) 10 MG tablet Take 10 mg by mouth daily.     ESBRIET  801 MG TABS Take 1 tablet (801 mg total) by mouth 3 (three)  times daily with meals. 270 tablet 2   fluorouracil (EFUDEX) 5 % cream Apply 1 Application topically 2 (two) times daily as needed (irritation).     fluticasone (FLONASE) 50 MCG/ACT nasal spray Place 2 sprays into both nostrils daily.     gabapentin (NEURONTIN) 300 MG capsule Take 900 mg by mouth 3 (three) times daily.     ipratropium (ATROVENT) 0.06 % nasal spray Place 2 sprays into both nostrils 3 (three) times daily.     loperamide (IMODIUM) 2 MG capsule Take 1 mg by mouth daily.     meclizine (ANTIVERT) 25 MG tablet Take 25 mg by mouth 2 (two) times daily as needed for dizziness.     Misc Natural Products (ELDERBERRY IMMUNE COMPLEX PO) Take 2 tablets by mouth daily.     Multiple Vitamin (MULTIVITAMIN) tablet Take 1 tablet by mouth daily.     omeprazole (PRILOSEC) 40 MG capsule Take 40 mg by mouth daily.     PARoxetine (PAXIL) 20 MG tablet Take 20 mg by mouth daily.     rosuvastatin (CRESTOR) 5 MG tablet  Take 5 mg by mouth daily.     sildenafil (REVATIO) 20 MG tablet Take 20 mg by mouth 3 (three) times daily.     SUMAtriptan (IMITREX) 100 MG tablet Take 100 mg by mouth every 2 (two) hours as needed for migraine.     No current facility-administered medications for this visit.    Allergies  Allergen Reactions   Bee Venom Rash   Shellfish Allergy  Hives   Nintedanib Other (See Comments)    SIRS, fevers   Pseudoephedrine Anxiety    Diagnoses:  Major depressive disorder, recurrent, moderate, irritability Plan of Care: I will meet with the patient every 2 to 3 weeks in person Treatment plan: We will use cognitive behavioral therapy, even though some therapy as well as elements of dialectical behavior therapy to help the patient reduce his anxiety by at least 50% with a target date of  October 30th 2024.  Goals will be to help the patient better manage anxiety symptoms and stress, identify causes for anxiety including changes in his health and ways to lower the anxiety, resolve the conflicts contributing to anxiety and help him manage thoughts and worrisome thinking contributing to his anxiety.  Interventions include providing education about anxiety to help him understand its causes symptoms and triggers..  We will facilitate problem solution skills as well as coping skills to help him manage anxiety and stress symptoms.  We will use cognitive behavioral therapy to identify and change anxiety provoking thought and behavior patterns as well as dialectical therapy to teach distress tolerance and mindfulness skills. Progress: 35% We will continue with treatment goals as listed above with a new target date of October 31st 2025                                  Lorrene CHRISTELLA Hasten, [facility]                  Lorrene CHRISTELLA Hasten, [facility]               Lorrene CHRISTELLA Hasten, [facility]               Lorrene CHRISTELLA Hasten, [facility]               Lorrene CHRISTELLA Hasten, [facility]  Lorrene CHRISTELLA Hasten, [facility]               Lorrene CHRISTELLA Hasten, [facility]               Lorrene CHRISTELLA Hasten, [facility]               Lorrene CHRISTELLA Hasten, [facility]               Lorrene CHRISTELLA Hasten, [facility]               Lorrene CHRISTELLA Hasten, [facility]               Lorrene CHRISTELLA Hasten, [facility]               Lorrene CHRISTELLA Hasten, [facility]               Lorrene CHRISTELLA Hasten, [facility]               Lorrene CHRISTELLA Hasten, [facility]               Lorrene CHRISTELLA Hasten, [facility]               Lorrene CHRISTELLA Hasten, [facility]               Lorrene CHRISTELLA Hasten, [facility]            Lorrene CHRISTELLA Hasten, [facility]               Lorrene CHRISTELLA Hasten, [facility]               Lorrene CHRISTELLA Hasten, [facility]               Lorrene CHRISTELLA Hasten, [facility]               Lorrene CHRISTELLA Hasten, [facility]               Lorrene CHRISTELLA Hasten, [facility]

## 2023-11-18 ENCOUNTER — Encounter: Payer: Self-pay | Admitting: Behavioral Health

## 2023-11-19 ENCOUNTER — Ambulatory Visit: Admitting: Behavioral Health

## 2023-11-26 ENCOUNTER — Encounter: Payer: Self-pay | Admitting: Internal Medicine

## 2023-11-26 ENCOUNTER — Telehealth: Payer: Self-pay

## 2023-11-26 NOTE — Telephone Encounter (Signed)
 Dr Charis, please advise sick mychart message.

## 2023-11-27 NOTE — Telephone Encounter (Signed)
 Yes the cold can put you at risk fro fibrosis flare up   Plan  - just because there is risk you dont g o to ER. Go to ER if things are worsening  - for cold - check covid x 3days. Check flu  - see if you can see an APP this week -> Clinical team can you arrange  - if your cough/wheezing/dyspnea are getting triggered but o2 holding -> then we can do antibiotics/prednisone 

## 2023-11-27 NOTE — Telephone Encounter (Signed)
 I called and spoke with the pt and notified of response from MR. Pt verbalized understanding. I offered ov for this afternoon or for tomorrow and he declined. He would like to keep his planned appt next wk and will call sooner if symptoms worsen. Right now, he says he is only having nasal congestion. He will call back if covid or flu is pos. Nothing further needed.

## 2023-12-02 ENCOUNTER — Telehealth: Payer: Self-pay | Admitting: Internal Medicine

## 2023-12-02 NOTE — Telephone Encounter (Signed)
 I called and spoke to Dustin Dean. 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 Last week, Dustin Dean got a call and was offered an appt sooner even though he see's MR on Wednesday. Dustin Dean states he figured he would be better but ended up getting much worse. Dustin Dean states at 3am, the ems was called and Dustin Dean will be admitted for 2-3 days.   Dustin Dean states he needs to cancel his upcoming appt due to being hospitalized. Both the pft and ov has been rescheduled. NFN

## 2023-12-02 NOTE — Telephone Encounter (Signed)
 Copied from CRM #[id number]. Topic: General - Other >> Dec 02, 2023 10:09 AM Corean SAUNDERS wrote: Reason for CRM: Patient is requesting a call back from Dr. Geronimo or nurse as he was admitted to the ER last night in [place] and would like to discuss something with the nurse but declined to provide further information to [facility] agent.

## 2023-12-03 ENCOUNTER — Ambulatory Visit: Admitting: Behavioral Health

## 2023-12-03 ENCOUNTER — Encounter: Payer: Self-pay | Admitting: Behavioral Health

## 2023-12-03 DIAGNOSIS — F411 Generalized anxiety disorder: Secondary | ICD-10-CM | POA: Diagnosis not present

## 2023-12-03 NOTE — Progress Notes (Signed)
 [facility] Counselor Initial Adult Exam  Name: Dustin Dean Date: 12/03/2023 MRN: [id number] DOB: [birth year] PCP: Willian Righter, MD  Time spent: 12 PM until 12:36 PM, 36 minutes spent via telehealth care agility visit.  The patient consented to the visit and is aware of his limitations.  The patient was in the hospital room and this therapist was in his outpatient therapy office.  The patient had a cold all of last week and thought that it was just a cold and would get better but was not getting better.  The weekend he woke up in the middle of the night and said he sat on the edge of the bed and recognized that he did not have any strength.  He said he basically slid down the side of the bed to the floor and lay down on his side.  He said he had no strength in his arms or legs to be able to get up.  Thankfully he was not hurt otherwise but it took 3 different EMTs to help get him up from between the bed and the wall and get him to the hospital.  He still had to wait about 12 hours in the emergency room to be able to get a room.  They feel it progressed from a cold to bronchitis and possibly pneumonia but they are waiting on blood work results to find that out.  He is on medication and breathing treatments with the possibility being discharged tomorrow.  He just knows that with his pulmonary fibrosis and age it is more difficult for his body to fight off colds.  We also looked at a family situation which was frustrating for the patient.  He said that he was not directly involved but was confused about the way things are handled but was proud of himself for looking at it from the outside and instead of feeling like he had to make a difference in his situation that he knew he could not change. He does contract for safety having no thoughts of hurting himself or anyone else.   Guardian/Payee: Self  Paperwork requested: Yes   Reason for Visit /Presenting Problem: Depression  . He  does contract for safety having no thoughts of hurting himself or anyone else. Mental Status Exam: Appearance:   Fairly Groomed     Behavior:  Appropriate  Motor:  Normal  Speech/Language:   Clear and Coherent and Normal Rate  Affect:  Appropriate  Mood:  normal  Thought process:  normal  Thought content:    WNL  Sensory/Perceptual disturbances:    WNL  Orientation:  oriented to person, place, time/date, situation, and day of week  Attention:  Good  Concentration:  Good  Memory:  WNL  Fund of knowledge:   Good  Insight:    Good  Judgment:   Good  Impulse Control:  Good     Reported Symptoms: Depression  Risk Assessment: Danger to Self:  No Self-injurious Behavior: No Danger to Others: No Duty to Warn:no Physical Aggression / Violence:No  Access to Firearms a concern: No  Gang Involvement:No  Patient / guardian was educated about steps to take if suicide or homicide risk level increases between visits: n/a While future psychiatric events cannot be accurately predicted, the patient does not currently require acute inpatient psychiatric care and does not currently meet [place]  involuntary commitment criteria.  Substance Abuse History: Current substance abuse: No     Past Psychiatric History:   Previous psychological history is  significant for depression Outpatient Providers: Primary care physician History of Psych Hospitalization: None reported Psychological Testing: n/a   Abuse History:  Victim of: No., None reported   Report needed: No. Victim of Neglect:No. Perpetrator of n/a Witness / Exposure to Domestic Violence: None reported  Protective Services Involvement: No  Witness to [employer] Violence:  No   Family History:  Family History  Problem Relation Age of Onset   Cancer Mother    Congestive Heart Failure Father    Prostate cancer Father    Healthy Sister     Living situation: the patient lives with their spouse  Sexual Orientation:  Straight  Relationship Status: married  Name of spouse / other: Did not discuss If a parent, number of children / ages: Adults  Support Systems: spouse  Financial Stress:  No   Income/Employment/Disability: Doctor, General Practice: No   Educational History: Education: high school diploma/GED  Religion/Sprituality/World View: Did not discuss  Any cultural differences that may affect / interfere with treatment:  not applicable   Recreation/Hobbies: Time with family  Stressors: Health problems    Strengths: Supportive Relationships, Hopefulness, Self Advocate, and Able to Communicate Effectively  Barriers:     Legal History: Pending legal issue / charges: The patient has no significant history of legal issues. History of legal issue / charges: Not applicable  Medical History/Surgical History: reviewed Past Medical History:  Diagnosis Date   Anxiety    Aortic stenosis    Flu    Pneumonia    Prostate cancer (HCC)    Pulmonary fibrosis (HCC)    Skin cancer     Past Surgical History:  Procedure Laterality Date   HERNIA REPAIR     NASAL SEPTUM SURGERY     RIGHT HEART CATH N/A 05/24/2023   Procedure: RIGHT HEART CATH;  Surgeon: Cherrie Toribio SAUNDERS, MD;  Location: [facility] INVASIVE CV LAB;  Service: Cardiovascular;  Laterality: N/A;   SKIN CANCER EXCISION      Medications: Current Outpatient Medications  Medication Sig Dispense Refill   acetaminophen  (TYLENOL ) 500 MG tablet Take 500 mg by mouth every 8 (eight) hours as needed for moderate pain (pain score 4-6).     aspirin EC 81 MG tablet Take 81 mg by mouth daily. Swallow whole.     Calcium Carbonate-Vitamin D (CALCIUM 600-VITAMIN D3 PO) Take 1 tablet by mouth daily.     cetirizine (ZYRTEC) 10 MG tablet Take 10 mg by mouth daily.     ESBRIET  801 MG TABS Take 1 tablet (801 mg total) by mouth 3 (three) times daily with meals. 270 tablet 2   fluorouracil (EFUDEX) 5 % cream Apply 1 Application topically  2 (two) times daily as needed (irritation).     fluticasone (FLONASE) 50 MCG/ACT nasal spray Place 2 sprays into both nostrils daily.     gabapentin (NEURONTIN) 300 MG capsule Take 900 mg by mouth 3 (three) times daily.     ipratropium (ATROVENT) 0.06 % nasal spray Place 2 sprays into both nostrils 3 (three) times daily.     loperamide (IMODIUM) 2 MG capsule Take 1 mg by mouth daily.     meclizine (ANTIVERT) 25 MG tablet Take 25 mg by mouth 2 (two) times daily as needed for dizziness.     Misc Natural Products (ELDERBERRY IMMUNE COMPLEX PO) Take 2 tablets by mouth daily.     Multiple Vitamin (MULTIVITAMIN) tablet Take 1 tablet by mouth daily.     omeprazole (PRILOSEC) 40 MG capsule  Take 40 mg by mouth daily.     PARoxetine (PAXIL) 20 MG tablet Take 20 mg by mouth daily.     rosuvastatin (CRESTOR) 5 MG tablet Take 5 mg by mouth daily.     sildenafil (REVATIO) 20 MG tablet Take 20 mg by mouth 3 (three) times daily.     SUMAtriptan (IMITREX) 100 MG tablet Take 100 mg by mouth every 2 (two) hours as needed for migraine.     No current facility-administered medications for this visit.    Allergies  Allergen Reactions   Bee Venom Rash   Shellfish Allergy  Hives   Nintedanib Other (See Comments)    SIRS, fevers   Pseudoephedrine Anxiety    Diagnoses:  Major depressive disorder, recurrent, moderate, irritability Plan of Care: I will meet with the patient every 2 to 3 weeks in person Treatment plan: We will use cognitive behavioral therapy, even though some therapy as well as elements of dialectical behavior therapy to help the patient reduce his anxiety by at least 50% with a target date of  October 30th 2024.  Goals will be to help the patient better manage anxiety symptoms and stress, identify causes for anxiety including changes in his health and ways to lower the anxiety, resolve the conflicts contributing to anxiety and help him manage thoughts and worrisome thinking contributing to his  anxiety.  Interventions include providing education about anxiety to help him understand its causes symptoms and triggers..  We will facilitate problem solution skills as well as coping skills to help him manage anxiety and stress symptoms.  We will use cognitive behavioral therapy to identify and change anxiety provoking thought and behavior patterns as well as dialectical therapy to teach distress tolerance and mindfulness skills. Progress: 45% We will continue with treatment goals as listed above with a new target date of May 14, 2024                                  Lorrene CHRISTELLA Hasten, [facility]                  Lorrene CHRISTELLA Hasten, [facility]               Lorrene CHRISTELLA Hasten, [facility]               Lorrene CHRISTELLA Hasten, [facility]               Lorrene CHRISTELLA Hasten, [facility]               Lorrene CHRISTELLA Hasten, [facility]               Lorrene CHRISTELLA Hasten, [facility]               Lorrene CHRISTELLA Hasten, [facility]               Lorrene CHRISTELLA Hasten, [facility]               Lorrene CHRISTELLA Hasten, [facility]               Lorrene CHRISTELLA Hasten, [facility]               Lorrene CHRISTELLA Hasten, [facility]               Lorrene CHRISTELLA Hasten, [facility]               Lorrene CHRISTELLA Hasten, [facility] Counselor Initial Adult Exam  Name: Dustin Dean Date: 12/03/2023 MRN: [id number] DOB: December 11, 1950 PCP: Willian Righter, MD  Time spent:     spent in person with the patient in the outpatient therapist office.  He does contract for safety having no thoughts of hurting himself or anyone else.   Guardian/Payee: Self  Paperwork requested: Yes   Reason for Visit /Presenting Problem: Depression  . He does contract for safety having no thoughts of hurting himself or anyone else. Mental Status Exam: Appearance:   Fairly Groomed     Behavior:  Appropriate  Motor:  Normal  Speech/Language:   Clear  and Coherent and Normal Rate  Affect:  Appropriate  Mood:  normal  Thought process:  normal  Thought content:    WNL  Sensory/Perceptual disturbances:    WNL  Orientation:  oriented to person, place, time/date, situation, and day of week  Attention:  Good  Concentration:  Good  Memory:  WNL  Fund of knowledge:   Good  Insight:    Good  Judgment:   Good  Impulse Control:  Good     Reported Symptoms: Depression  Risk Assessment: Danger to Self:  No Self-injurious Behavior: No Danger to Others: No Duty to Warn:no Physical Aggression / Violence:No  Access to Firearms a concern: No  Gang Involvement:No  Patient / guardian was educated about steps to take if suicide or homicide risk level increases between visits: n/a While future psychiatric events cannot be accurately predicted, the patient does not currently require acute inpatient psychiatric care and does not currently meet [place]  involuntary commitment criteria.  Substance Abuse History: Current substance abuse: No     Past Psychiatric History:   Previous psychological history is significant for depression Outpatient Providers: Primary care physician History of Psych Hospitalization: None reported Psychological Testing: n/a   Abuse History:  Victim of: No., None reported   Report needed: No. Victim of Neglect:No. Perpetrator of n/a Witness / Exposure to Domestic Violence: None reported  Protective Services Involvement: No  Witness to [employer] Violence:  No   Family History:  Family History  Problem Relation Age of Onset   Cancer Mother    Congestive Heart Failure Father    Prostate cancer Father    Healthy Sister     Living situation: the patient lives with their spouse  Sexual Orientation: Straight  Relationship Status: married  Name of spouse / other: Did not discuss If a parent, number of children / ages: Adults  Support Systems: spouse  Financial Stress:  No    Income/Employment/Disability: Doctor, General Practice: No   Educational History: Education: high school diploma/GED  Religion/Sprituality/World View: Did not discuss  Any cultural differences that may affect / interfere with treatment:  not applicable   Recreation/Hobbies: Time with family  Stressors: Health problems    Strengths: Supportive Relationships, Hopefulness, Self Advocate, and Able to Communicate Effectively  Barriers:     Legal History: Pending legal issue / charges: The patient has no significant history of legal issues. History of legal issue / charges: Not applicable  Medical History/Surgical History: reviewed Past Medical History:  Diagnosis Date   Anxiety    Aortic stenosis    Flu    Pneumonia    Prostate cancer (HCC)    Pulmonary fibrosis (HCC)    Skin cancer     Past Surgical History:  Procedure Laterality Date   HERNIA REPAIR     NASAL SEPTUM SURGERY     RIGHT HEART CATH N/A 05/24/2023  Procedure: RIGHT HEART CATH;  Surgeon: Cherrie Toribio SAUNDERS, MD;  Location: [facility] INVASIVE CV LAB;  Service: Cardiovascular;  Laterality: N/A;   SKIN CANCER EXCISION      Medications: Current Outpatient Medications  Medication Sig Dispense Refill   acetaminophen  (TYLENOL ) 500 MG tablet Take 500 mg by mouth every 8 (eight) hours as needed for moderate pain (pain score 4-6).     aspirin EC 81 MG tablet Take 81 mg by mouth daily. Swallow whole.     Calcium Carbonate-Vitamin D (CALCIUM 600-VITAMIN D3 PO) Take 1 tablet by mouth daily.     cetirizine (ZYRTEC) 10 MG tablet Take 10 mg by mouth daily.     ESBRIET  801 MG TABS Take 1 tablet (801 mg total) by mouth 3 (three) times daily with meals. 270 tablet 2   fluorouracil (EFUDEX) 5 % cream Apply 1 Application topically 2 (two) times daily as needed (irritation).     fluticasone (FLONASE) 50 MCG/ACT nasal spray Place 2 sprays into both nostrils daily.     gabapentin (NEURONTIN) 300 MG capsule Take  900 mg by mouth 3 (three) times daily.     ipratropium (ATROVENT) 0.06 % nasal spray Place 2 sprays into both nostrils 3 (three) times daily.     loperamide (IMODIUM) 2 MG capsule Take 1 mg by mouth daily.     meclizine (ANTIVERT) 25 MG tablet Take 25 mg by mouth 2 (two) times daily as needed for dizziness.     Misc Natural Products (ELDERBERRY IMMUNE COMPLEX PO) Take 2 tablets by mouth daily.     Multiple Vitamin (MULTIVITAMIN) tablet Take 1 tablet by mouth daily.     omeprazole (PRILOSEC) 40 MG capsule Take 40 mg by mouth daily.     PARoxetine (PAXIL) 20 MG tablet Take 20 mg by mouth daily.     rosuvastatin (CRESTOR) 5 MG tablet Take 5 mg by mouth daily.     sildenafil (REVATIO) 20 MG tablet Take 20 mg by mouth 3 (three) times daily.     SUMAtriptan (IMITREX) 100 MG tablet Take 100 mg by mouth every 2 (two) hours as needed for migraine.     No current facility-administered medications for this visit.    Allergies  Allergen Reactions   Bee Venom Rash   Shellfish Allergy  Hives   Nintedanib Other (See Comments)    SIRS, fevers   Pseudoephedrine Anxiety    Diagnoses:  Major depressive disorder, recurrent, moderate, irritability Plan of Care: I will meet with the patient every 2 to 3 weeks in person Treatment plan: We will use cognitive behavioral therapy, even though some therapy as well as elements of dialectical behavior therapy to help the patient reduce his anxiety by at least 50% with a target date of  October 30th 2024.  Goals will be to help the patient better manage anxiety symptoms and stress, identify causes for anxiety including changes in his health and ways to lower the anxiety, resolve the conflicts contributing to anxiety and help him manage thoughts and worrisome thinking contributing to his anxiety.  Interventions include providing education about anxiety to help him understand its causes symptoms and triggers..  We will facilitate problem solution skills as well as coping  skills to help him manage anxiety and stress symptoms.  We will use cognitive behavioral therapy to identify and change anxiety provoking thought and behavior patterns as well as dialectical therapy to teach distress tolerance and mindfulness skills. Progress: 35% We will continue with treatment goals as listed above  with a new target date of October 31st 2025                                  Lorrene CHRISTELLA Hasten, [facility]                  Lorrene CHRISTELLA Hasten, [facility]               Lorrene CHRISTELLA Hasten, [facility]               Lorrene CHRISTELLA Hasten, [facility]               Lorrene CHRISTELLA Hasten, [facility]               Lorrene CHRISTELLA Hasten, [facility]               Lorrene CHRISTELLA Hasten, [facility]               Lorrene CHRISTELLA Hasten, [facility]               Lorrene CHRISTELLA Hasten, [facility]               Lorrene CHRISTELLA Hasten, [facility]               Lorrene CHRISTELLA Hasten, [facility]               Lorrene CHRISTELLA Hasten, [facility]               Lorrene CHRISTELLA Hasten, [facility]               Lorrene CHRISTELLA Hasten, [facility]               Lorrene CHRISTELLA Hasten, [facility]               Lorrene CHRISTELLA Hasten, [facility]               Lorrene CHRISTELLA Hasten, [facility]               Lorrene CHRISTELLA Hasten, [facility]            Lorrene CHRISTELLA Hasten, [facility]               Lorrene CHRISTELLA Hasten, [facility]               Lorrene CHRISTELLA Hasten, [facility]               Lorrene CHRISTELLA Hasten, [facility]               Lorrene CHRISTELLA Hasten, [facility]               Lorrene CHRISTELLA Hasten, [facility]               Lorrene CHRISTELLA Hasten, [facility]

## 2023-12-04 ENCOUNTER — Encounter

## 2023-12-04 ENCOUNTER — Ambulatory Visit: Admitting: Internal Medicine

## 2023-12-09 ENCOUNTER — Telehealth: Payer: Self-pay | Admitting: *Deleted

## 2023-12-09 NOTE — Telephone Encounter (Signed)
 Patient was given a nebulizer from hospitalization last week. He was also given rx duo neb from hospital. They did not put a dx so insurance denied. He wants to know if you are ok with him adding duo neb and if you can prescribe.

## 2023-12-10 ENCOUNTER — Other Ambulatory Visit: Payer: Self-pay | Admitting: *Deleted

## 2023-12-10 MED ORDER — IPRATROPIUM-ALBUTEROL 0.5-2.5 (3) MG/3ML IN SOLN: 3.0000 mL | Freq: Four times a day (QID) | RESPIRATORY_TRACT | 2 refills | Status: AC | PRN

## 2023-12-10 NOTE — Telephone Encounter (Signed)
 I sent a secure chat to Dr. 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  Dr. Levin advised it was ok to sent in the script with the diagnosis code needed.  I sent a myhchart message to patient letting him know that the script was being sent into his pharmacy with the diagnosis codes.  I put in the diagnosis codes from the hospital and his chronic conditions.  Nothing further needed.

## 2023-12-10 NOTE — Telephone Encounter (Signed)
 Dr. Geronimo is working in the hospital currently.  I sent him a secure chat asking if it is ok to send in the order for the duo neb solution with appropriate diagnosis code.  Will await response.

## 2023-12-17 ENCOUNTER — Encounter: Payer: Self-pay | Admitting: Behavioral Health

## 2023-12-17 ENCOUNTER — Ambulatory Visit: Admitting: Behavioral Health

## 2023-12-17 DIAGNOSIS — F331 Major depressive disorder, recurrent, moderate: Secondary | ICD-10-CM | POA: Diagnosis not present

## 2023-12-17 DIAGNOSIS — F411 Generalized anxiety disorder: Secondary | ICD-10-CM

## 2023-12-17 NOTE — Progress Notes (Signed)
 [facility] Counselor Initial Adult Exam  Name: Dustin Dean Date: 12/17/2023 MRN: [id number] DOB: September 14, 1950 PCP: Willian Righter, MD  Time spent: 3 p.m. until 3:56 PM, 56 minutes spent face-to-face in the outpatient therapist office. The patient is feeling better but was in the hospital for nights.  He said what started as a small cold led to difficulty with breathing.  He said he learned from that he needs to address that immediately when he notices anything.  He reached out to both his pulmonologist and primary care physician who responded quickly and he knows that they will do what ever he needs them to do if that situation occurs again.  He is still grieving the loss of his dog but is able to dogs at some for a friend of theirs and says that is helping the transition.  He would love to have another dog but knows that is probably not the best thing to do now.  As he reflects back on the past year he can see with there have been some subtle changes in his health such as walking more with a cane and a little more difficulty breathing but feels that for the most part he has made the most of this year as he can.  He is looking at day trips that he can take coming up and we talked about what some of those are and the importance of those to him.  He has done some of those and will continue to do more.  He says that whenever the time comes he wants for his friends and family to have a celebration of his life and he has some thoughts about what that looks like and will begin to put him in writing.  He says is not morbid but just excepting the progression of his diagnosis and being okay with that. He does contract for safety having no thoughts of hurting himself or anyone else.   Guardian/Payee: Self  Paperwork requested: Yes   Reason for Visit /Presenting Problem: Depression  . He does contract for safety having no thoughts of hurting himself or anyone else. Mental Status  Exam: Appearance:   Fairly Groomed     Behavior:  Appropriate  Motor:  Normal  Speech/Language:   Clear and Coherent and Normal Rate  Affect:  Appropriate  Mood:  normal  Thought process:  normal  Thought content:    WNL  Sensory/Perceptual disturbances:    WNL  Orientation:  oriented to person, place, time/date, situation, and day of week  Attention:  Good  Concentration:  Good  Memory:  WNL  Fund of knowledge:   Good  Insight:    Good  Judgment:   Good  Impulse Control:  Good     Reported Symptoms: Depression  Risk Assessment: Danger to Self:  No Self-injurious Behavior: No Danger to Others: No Duty to Warn:no Physical Aggression / Violence:No  Access to Firearms a concern: No  Gang Involvement:No  Patient / guardian was educated about steps to take if suicide or homicide risk level increases between visits: n/a While future psychiatric events cannot be accurately predicted, the patient does not currently require acute inpatient psychiatric care and does not currently meet [place]  involuntary commitment criteria.  Substance Abuse History: Current substance abuse: No     Past Psychiatric History:   Previous psychological history is significant for depression Outpatient Providers: Primary care physician History of Psych Hospitalization: None reported Psychological Testing: n/a   Abuse History:  Victim  of: No., None reported   Report needed: No. Victim of Neglect:No. Perpetrator of n/a Witness / Exposure to Domestic Violence: None reported  Protective Services Involvement: No  Witness to [employer] Violence:  No   Family History:  Family History  Problem Relation Age of Onset   Cancer Mother    Congestive Heart Failure Father    Prostate cancer Father    Healthy Sister     Living situation: the patient lives with their spouse  Sexual Orientation: Straight  Relationship Status: married  Name of spouse / other: Did not discuss If a parent, number  of children / ages: Adults  Support Systems: spouse  Financial Stress:  No   Income/Employment/Disability: Doctor, General Practice: No   Educational History: Education: high school diploma/GED  Religion/Sprituality/World View: Did not discuss  Any cultural differences that may affect / interfere with treatment:  not applicable   Recreation/Hobbies: Time with family  Stressors: Health problems    Strengths: Supportive Relationships, Hopefulness, Self Advocate, and Able to Communicate Effectively  Barriers:     Legal History: Pending legal issue / charges: The patient has no significant history of legal issues. History of legal issue / charges: Not applicable  Medical History/Surgical History: reviewed Past Medical History:  Diagnosis Date   Anxiety    Aortic stenosis    Flu    Pneumonia    Prostate cancer (HCC)    Pulmonary fibrosis (HCC)    Skin cancer     Past Surgical History:  Procedure Laterality Date   HERNIA REPAIR     NASAL SEPTUM SURGERY     RIGHT HEART CATH N/A 05/24/2023   Procedure: RIGHT HEART CATH;  Surgeon: Cherrie Toribio SAUNDERS, MD;  Location: [facility] INVASIVE CV LAB;  Service: Cardiovascular;  Laterality: N/A;   SKIN CANCER EXCISION      Medications: Current Outpatient Medications  Medication Sig Dispense Refill   acetaminophen  (TYLENOL ) 500 MG tablet Take 500 mg by mouth every 8 (eight) hours as needed for moderate pain (pain score 4-6).     aspirin EC 81 MG tablet Take 81 mg by mouth daily. Swallow whole.     Calcium Carbonate-Vitamin D (CALCIUM 600-VITAMIN D3 PO) Take 1 tablet by mouth daily.     cetirizine (ZYRTEC) 10 MG tablet Take 10 mg by mouth daily.     ESBRIET  801 MG TABS Take 1 tablet (801 mg total) by mouth 3 (three) times daily with meals. 270 tablet 2   fluorouracil (EFUDEX) 5 % cream Apply 1 Application topically 2 (two) times daily as needed (irritation).     fluticasone (FLONASE) 50 MCG/ACT nasal spray Place 2  sprays into both nostrils daily.     gabapentin (NEURONTIN) 300 MG capsule Take 900 mg by mouth 3 (three) times daily.     ipratropium (ATROVENT) 0.06 % nasal spray Place 2 sprays into both nostrils 3 (three) times daily.     ipratropium-albuterol  (DUONEB) 0.5-2.5 (3) MG/3ML SOLN Take 3 mLs by nebulization every 6 (six) hours as needed. 360 mL 2   loperamide (IMODIUM) 2 MG capsule Take 1 mg by mouth daily.     meclizine (ANTIVERT) 25 MG tablet Take 25 mg by mouth 2 (two) times daily as needed for dizziness.     Misc Natural Products (ELDERBERRY IMMUNE COMPLEX PO) Take 2 tablets by mouth daily.     Multiple Vitamin (MULTIVITAMIN) tablet Take 1 tablet by mouth daily.     omeprazole (PRILOSEC) 40 MG capsule Take  40 mg by mouth daily.     PARoxetine (PAXIL) 20 MG tablet Take 20 mg by mouth daily.     rosuvastatin (CRESTOR) 5 MG tablet Take 5 mg by mouth daily.     sildenafil (REVATIO) 20 MG tablet Take 20 mg by mouth 3 (three) times daily.     SUMAtriptan (IMITREX) 100 MG tablet Take 100 mg by mouth every 2 (two) hours as needed for migraine.     No current facility-administered medications for this visit.    Allergies  Allergen Reactions   Bee Venom Rash   Shellfish Allergy  Hives   Nintedanib Other (See Comments)    SIRS, fevers   Pseudoephedrine Anxiety    Diagnoses:  Major depressive disorder, recurrent, moderate, irritability Plan of Care: I will meet with the patient every 2 to 3 weeks in person Treatment plan: We will use cognitive behavioral therapy, even though some therapy as well as elements of dialectical behavior therapy to help the patient reduce his anxiety by at least 50% with a target date of  October 30th 2024.  Goals will be to help the patient better manage anxiety symptoms and stress, identify causes for anxiety including changes in his health and ways to lower the anxiety, resolve the conflicts contributing to anxiety and help him manage thoughts and worrisome thinking  contributing to his anxiety.  Interventions include providing education about anxiety to help him understand its causes symptoms and triggers..  We will facilitate problem solution skills as well as coping skills to help him manage anxiety and stress symptoms.  We will use cognitive behavioral therapy to identify and change anxiety provoking thought and behavior patterns as well as dialectical therapy to teach distress tolerance and mindfulness skills. Progress: 45% We will continue with treatment goals as listed above with a new target date of May 14, 2024                                  Lorrene CHRISTELLA Hasten, [facility]                  Lorrene CHRISTELLA Hasten, [facility]               Lorrene CHRISTELLA Hasten, [facility]               Lorrene CHRISTELLA Hasten, [facility]               Lorrene CHRISTELLA Hasten, [facility]               Lorrene CHRISTELLA Hasten, [facility]               Lorrene CHRISTELLA Hasten, [facility]               Lorrene CHRISTELLA Hasten, [facility]               Lorrene CHRISTELLA Hasten, [facility]               Lorrene CHRISTELLA Hasten, [facility]               Lorrene CHRISTELLA Hasten, [facility]               Lorrene CHRISTELLA Hasten, [facility]               Lorrene CHRISTELLA Hasten, [facility]               Lorrene CHRISTELLA Hasten, [facility] Counselor Initial Adult Exam  Name: Dustin Dean Date: 12/17/2023 MRN: [id number] DOB: 02-11-1950 PCP: Willian Righter, MD  Time spent:     spent in person with the patient in the outpatient therapist office.  He does contract for safety having no thoughts of hurting himself or anyone else.   Guardian/Payee: Self  Paperwork requested: Yes   Reason for Visit /Presenting Problem: Depression  . He does contract for safety having no thoughts of hurting himself or anyone else. Mental Status Exam: Appearance:   Fairly Groomed     Behavior:  Appropriate  Motor:  Normal   Speech/Language:   Clear and Coherent and Normal Rate  Affect:  Appropriate  Mood:  normal  Thought process:  normal  Thought content:    WNL  Sensory/Perceptual disturbances:    WNL  Orientation:  oriented to person, place, time/date, situation, and day of week  Attention:  Good  Concentration:  Good  Memory:  WNL  Fund of knowledge:   Good  Insight:    Good  Judgment:   Good  Impulse Control:  Good     Reported Symptoms: Depression  Risk Assessment: Danger to Self:  No Self-injurious Behavior: No Danger to Others: No Duty to Warn:no Physical Aggression / Violence:No  Access to Firearms a concern: No  Gang Involvement:No  Patient / guardian was educated about steps to take if suicide or homicide risk level increases between visits: n/a While future psychiatric events cannot be accurately predicted, the patient does not currently require acute inpatient psychiatric care and does not currently meet [place]  involuntary commitment criteria.  Substance Abuse History: Current substance abuse: No     Past Psychiatric History:   Previous psychological history is significant for depression Outpatient Providers: Primary care physician History of Psych Hospitalization: None reported Psychological Testing: n/a   Abuse History:  Victim of: No., None reported   Report needed: No. Victim of Neglect:No. Perpetrator of n/a Witness / Exposure to Domestic Violence: None reported  Protective Services Involvement: No  Witness to [employer] Violence:  No   Family History:  Family History  Problem Relation Age of Onset   Cancer Mother    Congestive Heart Failure Father    Prostate cancer Father    Healthy Sister     Living situation: the patient lives with their spouse  Sexual Orientation: Straight  Relationship Status: married  Name of spouse / other: Did not discuss If a parent, number of children / ages: Adults  Support Systems: spouse  Financial Stress:  No    Income/Employment/Disability: Doctor, General Practice: No   Educational History: Education: high school diploma/GED  Religion/Sprituality/World View: Did not discuss  Any cultural differences that may affect / interfere with treatment:  not applicable   Recreation/Hobbies: Time with family  Stressors: Health problems    Strengths: Supportive Relationships, Hopefulness, Self Advocate, and Able to Communicate Effectively  Barriers:     Legal History: Pending legal issue / charges: The patient has no significant history of legal issues. History of legal issue / charges: Not applicable  Medical History/Surgical History: reviewed Past Medical History:  Diagnosis Date   Anxiety    Aortic stenosis    Flu    Pneumonia    Prostate cancer (HCC)    Pulmonary fibrosis (HCC)    Skin cancer     Past Surgical History:  Procedure Laterality Date   HERNIA REPAIR     NASAL SEPTUM SURGERY     RIGHT HEART CATH N/A 05/24/2023  Procedure: RIGHT HEART CATH;  Surgeon: Cherrie Toribio SAUNDERS, MD;  Location: [facility] INVASIVE CV LAB;  Service: Cardiovascular;  Laterality: N/A;   SKIN CANCER EXCISION      Medications: Current Outpatient Medications  Medication Sig Dispense Refill   acetaminophen  (TYLENOL ) 500 MG tablet Take 500 mg by mouth every 8 (eight) hours as needed for moderate pain (pain score 4-6).     aspirin EC 81 MG tablet Take 81 mg by mouth daily. Swallow whole.     Calcium Carbonate-Vitamin D (CALCIUM 600-VITAMIN D3 PO) Take 1 tablet by mouth daily.     cetirizine (ZYRTEC) 10 MG tablet Take 10 mg by mouth daily.     ESBRIET  801 MG TABS Take 1 tablet (801 mg total) by mouth 3 (three) times daily with meals. 270 tablet 2   fluorouracil (EFUDEX) 5 % cream Apply 1 Application topically 2 (two) times daily as needed (irritation).     fluticasone (FLONASE) 50 MCG/ACT nasal spray Place 2 sprays into both nostrils daily.     gabapentin (NEURONTIN) 300 MG capsule Take  900 mg by mouth 3 (three) times daily.     ipratropium (ATROVENT) 0.06 % nasal spray Place 2 sprays into both nostrils 3 (three) times daily.     ipratropium-albuterol  (DUONEB) 0.5-2.5 (3) MG/3ML SOLN Take 3 mLs by nebulization every 6 (six) hours as needed. 360 mL 2   loperamide (IMODIUM) 2 MG capsule Take 1 mg by mouth daily.     meclizine (ANTIVERT) 25 MG tablet Take 25 mg by mouth 2 (two) times daily as needed for dizziness.     Misc Natural Products (ELDERBERRY IMMUNE COMPLEX PO) Take 2 tablets by mouth daily.     Multiple Vitamin (MULTIVITAMIN) tablet Take 1 tablet by mouth daily.     omeprazole (PRILOSEC) 40 MG capsule Take 40 mg by mouth daily.     PARoxetine (PAXIL) 20 MG tablet Take 20 mg by mouth daily.     rosuvastatin (CRESTOR) 5 MG tablet Take 5 mg by mouth daily.     sildenafil (REVATIO) 20 MG tablet Take 20 mg by mouth 3 (three) times daily.     SUMAtriptan (IMITREX) 100 MG tablet Take 100 mg by mouth every 2 (two) hours as needed for migraine.     No current facility-administered medications for this visit.    Allergies  Allergen Reactions   Bee Venom Rash   Shellfish Allergy  Hives   Nintedanib Other (See Comments)    SIRS, fevers   Pseudoephedrine Anxiety    Diagnoses:  Major depressive disorder, recurrent, moderate, irritability Plan of Care: I will meet with the patient every 2 to 3 weeks in person Treatment plan: We will use cognitive behavioral therapy, even though some therapy as well as elements of dialectical behavior therapy to help the patient reduce his anxiety by at least 50% with a target date of  October 30th 2024.  Goals will be to help the patient better manage anxiety symptoms and stress, identify causes for anxiety including changes in his health and ways to lower the anxiety, resolve the conflicts contributing to anxiety and help him manage thoughts and worrisome thinking contributing to his anxiety.  Interventions include providing education about  anxiety to help him understand its causes symptoms and triggers..  We will facilitate problem solution skills as well as coping skills to help him manage anxiety and stress symptoms.  We will use cognitive behavioral therapy to identify and change anxiety provoking thought and behavior patterns as  well as dialectical therapy to teach distress tolerance and mindfulness skills. Progress: 35% We will continue with treatment goals as listed above with a new target date of October 31st 2025                                  Lorrene CHRISTELLA Hasten, [facility]                  Lorrene CHRISTELLA Hasten, [facility]               Lorrene CHRISTELLA Hasten, [facility]               Lorrene CHRISTELLA Hasten, [facility]               Lorrene CHRISTELLA Hasten, [facility]               Lorrene CHRISTELLA Hasten, [facility]               Lorrene CHRISTELLA Hasten, [facility]               Lorrene CHRISTELLA Hasten, [facility]               Lorrene CHRISTELLA Hasten, [facility]               Lorrene CHRISTELLA Hasten, [facility]               Lorrene CHRISTELLA Hasten, [facility]               Lorrene CHRISTELLA Hasten, [facility]               Lorrene CHRISTELLA Hasten, [facility]               Lorrene CHRISTELLA Hasten, [facility]               Lorrene CHRISTELLA Hasten, [facility]               Lorrene CHRISTELLA Hasten, [facility]               Lorrene CHRISTELLA Hasten, [facility]               Lorrene CHRISTELLA Hasten, [facility]            Lorrene CHRISTELLA Hasten, [facility]               Lorrene CHRISTELLA Hasten, [facility]               Lorrene CHRISTELLA Hasten, [facility]               Lorrene CHRISTELLA Hasten, [facility]               Lorrene CHRISTELLA Hasten, [facility]               Lorrene CHRISTELLA Hasten, [facility]               Lorrene CHRISTELLA Hasten, [facility]               Lorrene CHRISTELLA Hasten, [facility]

## 2023-12-27 NOTE — Telephone Encounter (Signed)
 Dr. Geronimo answered via secure chat saying it was ok to send in prescription for Duoneb with currect diagnosis code.  This was sent in to the pharmacy on 12/10/23.  Nothing further needed.

## 2023-12-27 NOTE — Telephone Encounter (Signed)
I think I said yes

## 2023-12-31 ENCOUNTER — Ambulatory Visit: Admitting: Internal Medicine

## 2023-12-31 ENCOUNTER — Encounter: Payer: Self-pay | Admitting: Internal Medicine

## 2023-12-31 ENCOUNTER — Ambulatory Visit

## 2023-12-31 VITALS — BP 118/70 | HR 79 | Temp 97.9°F | Ht 68.0 in | Wt 200.0 lb

## 2023-12-31 DIAGNOSIS — J9611 Chronic respiratory failure with hypoxia: Secondary | ICD-10-CM | POA: Diagnosis not present

## 2023-12-31 DIAGNOSIS — I2723 Pulmonary hypertension due to lung diseases and hypoxia: Secondary | ICD-10-CM

## 2023-12-31 DIAGNOSIS — J849 Interstitial pulmonary disease, unspecified: Secondary | ICD-10-CM

## 2023-12-31 DIAGNOSIS — Z5181 Encounter for therapeutic drug level monitoring: Secondary | ICD-10-CM

## 2023-12-31 LAB — PULMONARY FUNCTION TEST
DL/VA % pred: 82 %
DL/VA: 3.33 ml/min/mmHg/L
DLCO cor % pred: 42 %
DLCO cor: 10.39 ml/min/mmHg
DLCO unc % pred: 38 %
DLCO unc: 9.28 ml/min/mmHg
FEF 25-75 Pre: 1.4 L/s
FEF2575-%Pred-Pre: 62 %
FEV1-%Pred-Pre: 51 %
FEV1-Pre: 1.54 L
FEV1FVC-%Pred-Pre: 108 %
FEV6-%Pred-Pre: 50 %
FEV6-Pre: 1.91 L
FEV6FVC-%Pred-Pre: 105 %
FVC-%Pred-Pre: 47 %
FVC-Pre: 1.92 L
Pre FEV1/FVC ratio: 80 %
Pre FEV6/FVC Ratio: 99 %

## 2023-12-31 LAB — HEPATIC FUNCTION PANEL
ALT: 14 U/L (ref 3–53)
AST: 17 U/L (ref 5–37)
Albumin: 3.9 g/dL (ref 3.5–5.2)
Alkaline Phosphatase: 101 U/L (ref 39–117)
Bilirubin, Direct: 0.1 mg/dL (ref 0.1–0.3)
Total Bilirubin: 0.2 mg/dL (ref 0.2–1.2)
Total Protein: 7 g/dL (ref 6.0–8.3)

## 2023-12-31 NOTE — Progress Notes (Signed)
 Cardiology Dr. Sherron Champ 02/15/20 Dustin Dean is 73 y.o. year old male with a history of No prior cardiovascular disease who presents to [facility] cardiology for the evaluation of a heart murmur. This patient is a non-smoker and denies a previous history of myocardial infarction, congestive heart failure, or valvular heart disease. 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 He really has little in the way of exertional chest symptoms. Occasionally according to the wife if he walks up an incline briskly he may have some minor shortness of breath but remains active and has little in the way limitation. He denies a previous history of myocardial infarction, congestive heart failure, or known valvular heart disease. At a recent examination he was found to have a heart murmur and an echocardiogram was performed. It demonstrated a peak gradient across his aortic valve of 30 mmHg and an aortic valve area of 1.1 cm. This was read as being consistent with moderate aortic stenosis. I had a fairly significant discussion with the patient and his wife about the implications of his aortic valve and the need to follow the aortic stenosis progression over time.  11/14/2020 office visit with [name] pulmonary physician assistant at [facility] has past medical history of restrictive lung disease, interstitial lung disease, allergic rhinitis, laryngeal pharyngeal reflux, arthritis, lung nodules and persistent cough. He is a non-smoker and has never smoked. He has medication allergy  to pseudoephedrine.  [name] presents today with complaint of a progressively worsening shortness of breath and dyspnea on exertion over the past several months. We have been following his interstitial lung disease since 2018. He has been stable without complaint of dyspnea on exertion over the past several years. He indicates that recently he has had more shortness  of breath with exertion. He and his wife will walk the dog in their usual route through the neighborhood that they have done for many years. He reports that he will get short of breath along the root in a way that he has never been bothered with previously. He has recently requested an albuterol  inhaler and reports that it has been helpful. He has been using Symbicort 160/4.5 twice daily but has been having difficulty using it successfully as he has been coughing significantly and loses a lot of the medication. A recent chest CT conducted 10/24/2020 notes a progression in the interstitial changes. A 9 mm right lower lobe nodule is also noted. The plan is to have a 32-month follow-up chest CT conducted 02/04/2021. A 6-minute walk test conducted today shows oxygen desaturation and need for supplemental oxygen. I will place the order for supplemental oxygen. I will also refer him to the interstitial lung clinic at [facility] for further evaluation and treatment.   Patient at rest on RA with baseline sats : 97% After exertion for 3 min on RA pt's sats were : 86% Patient placed on oxygen via nasal cannula at 2 lpm flow via portable oxygen concentrator device Exertion for 6 mins with oxygen confirmed final sats at 94%   #1 lung nodules Chest CT 10/24/2020 shows 9 mm right lower lobe lung nodule Follow-up chest CT scheduled for 53-month reevaluation in January 2023  2. Interstitial lung disease Chest CT 11/04/2020 notes fibrotic interstitial lung disease with evidence of progression compared to 08/28/2020 Referral to interstitial lung clinic at [facility] for further evaluation and treatment   #  Primary Care dec 2022 Osteopenia Due for DXA. Prostate cancer Saw Dr. Cam at [facility]. Biopsy + for cancer. They are just watching it. He is going to have MRI and another biopsy next visit.   OV 01/24/2021 -evaluation and transfer of care to Dr. Geronimo in ILD center in [place].  Referred by  [name] patient support group leader for the pulmonary fibrosis foundation support group  Subjective:  Patient ID: Dustin Dean, male , DOB: 15-Mar-1950 , age 48 y.o. , MRN: [id number] , ADDRESS: [id number] [place] [address] PCP Willian Righter, MD Patient Care Team: Willian Righter, MD as PCP - General (Family Medicine)  This Provider for this visit: Treatment Team:  Attending Provider: Geronimo Amel, MD    01/24/2021 -   Chief Complaint  Patient presents with   Consult    Pt is here for a switch of providers to take over care for his pulmonary fibrosis. 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 Pt does become SOB with activities and also states that he does have a chronic cough.     HPI  Dustin Dean 73 y.o. -new evaluation for interstitial lung disease.  History is provided by review of the chart and also talking to the patient and his wife.  He tells me that he has had chronic cough since his teenage years.  But for the last few to several years its been more consistent.  He was seeing the physician assistant at [facility].  Somewhere along the way he got referred to Dr. Garnette Pepper right at [facility] some 3 years ago.  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  After that cough is largely improved except for occasional exacerbations in the fall in the winter.  Then in 2018 sometime after he went on gabapentin he was informed by the physician assistant that he might have early ILD.  He does not recollect any serology or biopsy being done.  He does recollect a few pulmonary function test.  Then all of a sudden starting September 2022 his cough got out of hand and also developed worsening shortness of breath.  This the first time he started developing worsening shortness of breath.  Then by November 2022 started needing exertional oxygen leading to current symptoms.  He was referred to [facility] but he preferred to establish with the  ILD center here in [place] with us  based on [facility] recommendation.  He found [name] through Internet search for pulmonary fibrosis foundation.  His current symptom score is below.  He is not on any antifibrotic.  [place] Integrated Comprehensive ILD Questionnaire       Past Medical History :  -He has obesity BMI 33-34 - Denies any collagen vascular disease - He is known to have aortic stenosis -sees cardiologist at [facility]  December 2021 echocardiogram EF 55 to 60% with tricuspid aortic valve and moderate stenosis normal right ventricle -Denies any asthma or COPD.  Denies any heart failure denies any collagen vascular disease -Denies snoring or excessive daytime somnolence.  Denies stroke.  Denies formal diagnosis of sleep apnea denies formal diagnosis of pulmonary hypertension [echo a year ago did not show pulmonary hypertension] -Has had vaccination against COVID but has not had COVID disease -Recent diagnosis of late 2022 early stage prostate cancer at [facility] urology on observation treatment -Has history of skin cancer for which he apply sunscreen regularly  ROS:  -Shortness of breath present  Cough chronic plus -Has intermittent chronic diarrhea and takes Imodium but no other GI side effects -Possible Raynaud's for the last several decades  FAMILY HISTORY of LUNG DISEASE:  Denies family for pulmonary fibrosis.  Denies family Struve COPD or asthma sarcoid or cystic fibrosis.  Denies family history of hypersensitive pneumonitis.  Denies autoimmune disease.  Denies any premature graying of the hair or albinism  PERSONAL EXPOSURE HISTORY:  No prior cigarette smoking -No marijuana use of cocaine use no intravenous drug use.  HOME  EXPOSURE and HOBBY DETAILS :  -Single-family home in a one-story space.  Gearold setting age of the current home is 31 years.  He is lived there for 6 years.  He does use a feather pillow and this occasionally mold or mildew in the  bathroom.  All his life he is done active gardening with mulch and woodchips and occasionally damp soil.  He is worked out in the yard quite a bit and does gardening.  He enjoys the gardening.  OCCUPATIONAL HISTORY (122 questions) : -He worked in the [employer] at [employer] and then [employer] no [employer].  He does not remember if the buildings had mold in it.  He does not think so -Detail greater than 100 question organic and inorganic antigen history exposure is negative  PULMONARY TOXICITY HISTORY (27 items):  Denies  INVESTIGATIONS: Report of pulmonary function test but I am not able to get it in Care Everywhere       High-resolution CT scan of the chest August 2020  -There is a report of honeycombing but without any craniocaudal gradient.  Air-trapping reported alternative pattern more suggestive of chronic hypersensitive pneumonitis.  But there is already progression from August 2019 -Read by Dr. Toribio Aye   CT Chest data- HRCT 11/04/20 unable for my visualization.   CLINICAL DATA:  Worsening shortness of breath   EXAM:  CT CHEST WITHOUT CONTRAST   TECHNIQUE:  Multidetector CT imaging of the chest was performed following the  standard protocol without intravenous contrast. High resolution  imaging of the lungs, as well as inspiratory and expiratory imaging,  was performed.   COMPARISON:  Chest CT dated August 29, 2018   FINDINGS:  Cardiovascular: Normal heart size. No pericardial effusion. Coronary  artery calcifications of the circumflex. Aortic valve  calcifications. Minimal calcified plaque of the thoracic aorta.   Mediastinum/Nodes: Mildly enlarged mediastinal lymph nodes,  unchanged compared to prior exam and likely reactive. Esophagus and  thyroid are unremarkable.   Lungs/Pleura: Central airways are patent. Bilateral air trapping.  Peribronchovascular and subpleural reticular opacities with traction  bronchiectasis and  no clear craniocaudal predominance. Honeycomb  change primarily seen in the anterior upper lobes is increased when  compared to prior exam. New linear nodular opacity of the right  lower lobe measuring up to 9 mm on series 4, image 210.   Upper Abdomen: No acute abnormality.   Musculoskeletal: No chest wall mass or suspicious bone lesions  identified.   IMPRESSION:  Fibrotic interstitial lung disease with evidence of progression when  compared with August 29, 2018 prior exam. Favor fibrotic  hypersensitivity pneumonitis given presence of air trapping.  Findings are suggestive of an alternative diagnosis (not UIP) per  consensus guidelines: Diagnosis of Idiopathic Pulmonary Fibrosis: An  Official ATS/ERS/JRS/ALAT Clinical Practice Guideline. Am JINNY Honey  Crit Care Med Vol 198, Iss 5, [phone], Sep 15 2016.   New linear nodular opacity of the right lower lobe measuring  up to 9  mm, likely an area of focal fibrosis. Recommend follow-up chest CT  in 3 months to ensure stability.   Aortic Atherosclerosis (ICD10-I70.0).   Addendum 01/26/2021 -radiology Dr. Conley wrote back saying CT scan is classic for chronic HP.  We will call the patient and get the patient started on nintedanib counseling which is first-line for progressive non-- IPF ILD.  If he is reluctant then we will do pirfenidone .  We will set up pharmacy counseling.  If he is reluctant for nintedanib based on risk versus benefit we will do pirfenidone   Electronically Signed    By: Rea Marc M.D.    On: 11/04/2020 14:17  No results found.   Phone visit 01/28/21  Emilyh  LEt Dustin Dean know ahead of the visit followup with me on 02/23/21   A) making referral to [name] to discuss ofev which would be first line baed on what radiologist told me about CT and fact rheumaotid facot is positive  B) rheumatoid factor strongly positive - refer rheumatology Dr Deveshwar/Rice  C) I reviewed echo report (below) Will discuss  at followup  Aortic Valve: There is moderate stenosis, with peak and mean gradients  of 44.000 and 27.000 mmHg.  Aortic valve area calculates to approximately  1.4 cm    Mitral Valve: There is mild regurgitation.    Tricuspid Valve: There is mild regurgitation.    Tricuspid Valve: The right ventricular systolic pressure is normal (<36  mmHg).   1.  Adequate 2D M-mode and color flow Doppler echocardiogram demonstrates  normal left ventricular chamber size and contractility.  There may be mild  left ventricular hypertrophy.  Ejection fraction is estimated 65%  2.  The aortic valve is thickened and demonstrates reduced leaflet  mobility.  There is mild to moderate aortic stenosis with aortic valve  area calculating to 1.4 cm  3.  Mild mitral annular calcification is noted but there is good leaflet  mobility.  Mild mitral insufficiency is noted  4.  Tricuspid valve appears to be normal  5.  The atria are grossly normal size bilaterally  6.  The right ventricular chamber size and function is normal  6.  The pericardium is of normal thickness there is no effusion  OV 02/23/2021  Subjective:  Patient ID: Dustin Dean, male , DOB: 02/23/1950 , age 68 y.o. , MRN: [id number] , ADDRESS: [id number] [place] [address] PCP Willian Righter, MD Patient Care Team: Willian Righter, MD as PCP - General (Family Medicine)  This Provider for this visit: Treatment Team:  Attending Provider: Geronimo Amel, MD    02/23/2021 -   Chief Complaint  Patient presents with   Follow-up    Pt recently had a HRCT and PFT and is here today to discuss the results.   Chronic cough on gabapentin  Interstitial lung disease work-up in progress -concern for hypersensitive pneumonitis  -Progressive phenotype between August 2020 and October 2022  9 mm right lower lobe nodule seen in October 2022 for the first time: Stable January 2023  HPI Naythan Douthit 73 y.o. -since his last visit Dr. Conley the  radiologist did look at the CT scan and this confirmed his CT is not consistent with UIP but more likely an alternate pattern consistent with hypersensitive pneumonitis as seen by air trapping.  Patient has had serology work-up in which it is all negative except rheumatoid factor strongly positive.  He is yet to see the rheumatologist.  He does have  significant restrictions on the PFTs with reduced DLCO consistent with his obesity and ILD.  His symptoms are overall stable.  Did go through his exposure history again.  He says in 200 07/2006 and again in 2012 he had had 3 episodes of pneumonia.  All of this happened when he entered school buildings.  He was living in the same house at that time but the house itself was brand-new and there is no mold or mildew.  He is wondering about crawlspace because only   he has seen crawlspace but he said he has never been in the crawl space.  He does not know if there was mold in the crawl space.  I told him it is doubtful that mold or mildew from the crawlspace can get into the house.  He did have a feather pillow but since his last visit he is thrown that out.  He does do gardening and does get exposed to dampness.  At this point in time did discuss with him that he does have ILD and it is progressive.  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  Unclear if the positive rheumatoid factor is playing a role it could be if he has positive for rheumatoid arthritis.  Did indicate that ultimately only a biopsy can put to rest if he has IPF [being Caucasian gentleman greater than 65 is classic phenotype for IPF] but did indicate to him that given progression does not matter whether it is IPF or non-IPF antifibrotic's is indicated.  He is already met with the pharmacist and discussed the 2 antifibrotic's.  Did indicate to him that nintedanib is first-line and non-- IPF progressive phenotype.  Did discuss about the diarrhea.  Denies any  contraindication.  Therefore we will start this.  Did discuss whether diarrhea is willing to go through this.  Also discussed about ILD-Pro registry.  He is interested given the consent form.  Did indicate to him that it probably would be beneficial to do some kind of a limited work-up in differentiating his etiology.  The reasons would be to participate in clinical trials but also in case he needs immunosuppression such as prednisone  or CellCept [indicated for hypersensitive pneumonitis but not for IPF] having a specific diagnosis would be beneficial.  I did think that his obesity puts him at some risk for getting surgical lung biopsy and therefore it might be better to start off with bronchoscopy with lavage and transbronchial biopsies.  We can address this in the future.  This visit was just focused on therapies.  Regarding his 9 mm lower lobe nodule: He had a CT scan of the chest and shows this is stable from October 2022 through January 2023.  The CT scan again reads as alternative pattern consistent with chronic HP.    CT Chest data January 2023  No results found.  Mediastinum/Nodes: Multiple prominent borderline but nonenlarged mediastinal and bilateral hilar lymph nodes, similar to the prior study esophagus is unremarkable in appearance. No axillary lymphadenopathy.   Lungs/Pleura: High-resolution images again demonstrate widespread but patchy areas of ground-glass attenuation, septal thickening, subpleural reticulation, thickening of the peribronchovascular interstitium, cylindrical and varicose traction bronchiectasis, peripheral bronchiolectasis and extensive honeycombing. There is no discernible craniocaudal gradient. The most extensive areas of honeycombing are anterior lung, predominantly in the upper lobes and right middle lobe. Some areas of the lung bases demonstrate relative sparing. Inspiratory and expiratory imaging demonstrates mild to moderate air trapping indicative of  small airways disease. No definite  progression compared to the recent prior study. No acute consolidative airspace disease. No pleural effusions. Previously described nodular area of architectural distortion in the right lower lobe (axial image 106 of series 8) is stable measuring 8 mm on today's examination, likely part of the underlying fibrosis. No other definite suspicious appearing pulmonary nodules or masses are noted.   Upper Abdomen: Unremarkable.   Musculoskeletal: There are no aggressive appearing lytic or blastic lesions noted in the visualized portions of the skeleton.   IMPRESSION: 1. 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 2. Previously noted nodular area of architectural distortion in the right lower lobe is unchanged, likely part of the fibrosis rather than a pulmonary nodule. 3. There are calcifications of the aortic valve and mitral annulus. Echocardiographic correlation for evaluation of potential valvular dysfunction may be warranted if clinically indicated. 4. Aortic atherosclerosis, in addition to 2 vessel coronary artery disease. Please note that although the presence of coronary artery calcium documents the presence of coronary artery disease, the severity of this disease and any potential stenosis cannot be assessed on this non-gated CT examination. Assessment for potential risk factor modification, dietary therapy or pharmacologic therapy may be warranted, if clinically indicated.   Aortic Atherosclerosis (ICD10-I70.0).     Electronically Signed   By: Toribio Aye M.D.   On: 02/14/2021 06:17 stable January 2023  PFT     (H): Data is abnormally high     OV  03/23/2021- acute video visit due to concerns for acute symptoms ? Ofev side effect  Subjective:  Patient ID: Caylin Raby, male , DOB: Nov 12, 1950 , age 69 y.o. , MRN: [id number] , ADDRESS: [id number] [place] [address] PCP Willian Righter, MD Patient Care Team: Willian Righter, MD as PCP - General (Family Medicine)  This Provider for this visit: Treatment Team:  Attending Provider: Geronimo Amel, MD  Type of visit: Video Circumstance: COVID-19 national emergency Identification of patient Mory Herrman with [birth year] and MRN [id number] - 2 person identifier Risks: Risks, benefits, limitations of telephone visit explained. Patient understood and verbalized agreement to proceed Anyone else on call: just patient Patient location: Inpatient bed at [facility] which is another health system This provider location: [address] pulmonary office [address]  [place], [place] [address].   03/23/2021 -in this video visit was set up on acute basis because he started having symptoms after starting nintedanib.  He wanted to know if it was nintedanib symptoms.  The symptoms sound rather complex and little bit out of proportion for nintedanib.  Therefore I scheduled this video visit and to my surprise he is sitting in a hospital bed at [facility] hospital.    HPI Fawaz Borquez 73 y.o. - started ofev mid-feb 2023. Started noticing feeling disoriented and at times weak. Then on 3/4-03/19/21 was feeling very weak whole bidy weakness and needed walker to even go across room. Tired. Stopped going to gym. Was using o2 all the time. Then on 03/21/21 feelign cold. ANd ahd fever  102F.  Does not remember Tuesday much. Initialyt Dx is pneumonia and testing/imaging -> by tthen pccm saw him Dr Arlina. Procal negative. WBC normal. Concerns is ILD flare up and recommendation is prednisone .  CT scan did not show pneumonia Curently on IV solumedrol. Currently  some better. Yesterday ate well.  Currently on 2.5LN . He has been covid negative. Multiplex resp virus panel negative per outside chart revie  With ofev was having some nausea. No abd pain. No diarrhea. No stomach cramps. No vomitting   Currently ofev on hold    03/28/2021 Follow up : ILD , Post hospital follow up  Patient returns for a 1 week follow-up visit.  Patient was seen in January 2023 for second opinion for interstitial lung disease.  Patient was felt to have possible concern for hypersensitivity pneumonitis.  He has had a progressive phenotype noted on CT scan from August 2020 to October 2022.  Serology was negative except for a strongly positive rheumatoid factor.  He was referred to rheumatology.  PFT showed significant restriction and reduced DLCO.  Has a history of recurrent pneumonia.  He was recommended to begin antifibrotic's. With OFEV.  Patient was hospitalized last week with severe dyspnea, weakness, activity intolerance to point he could not walk. Confusion, low oxygen levels, and fever-tmax 102. SABRA [place] he has a viral illness and ILD flare.  Patient was hospitalized at an outlying hospital.  He was treated with steroids.  Discharged on a steroid taper. CT chest 03/22/21 report showed fibrotic ILD, stable since 10/2020, no acute consolidation.  Covid , Influezna  and Viral panel were neg.  Since getting out of the hospital patient is feeling much better. Energy level has picked, decreased oxygen demands, no fever. Appetite is some better . Patient is concerned this could have been from [facility] as his symptoms started when he started OFEV.  Is on Oxygen with activity and At bedtime  2l/m . At rest O2 sats are >90%.      MDD 03/28/21 MDD Impression/Recs: Recent flare up. Bx can be prohibitive. get rheum eval and if neg - manage as HP. Maybe at the most BAL if stable  OV 04/05/2021  Subjective:  Patient ID: Dustin Dean, male , DOB: [birth year] , age 82 y.o. , MRN: [id number] , ADDRESS: [id number] [place]  [place] [address] PCP Willian Righter, MD Patient Care Team: Willian Righter, MD as PCP - General (Family Medicine)  This Provider for this visit: Treatment Team:  Attending Provider: Geronimo Amel, MD    04/05/2021 -   Chief Complaint  Patient presents with   Follow-up    Pt states he is feeling better after last hospital stay and states each day is getting better.   Chronic cough on gabapentin  Interstitial lung disease work-up in progress -concern for hypersensitive pneumonitis  -Progressive phenotype between August 2020 and October 2022   - started ofev mid feb 2023 -> early march 2023 admitted for ILD flare  9 mm right lower lobe nodule seen in October 2022 for the first time: Stable January 2023  HPI Brazos Sandoval 73 y.o. -returns for followup. He is on 1.5 tab of pred curently . Next week is 1 tab pred and off. He is better. Feeling better. AT rest not using o2 because he is better. In fact symp score shows huge imrpovement. Walking desat test is also largey imprpvd. All ? Steroid effects. We had conversation about his recent flare up  - has hx of recurrent pna: 2005-2006 x 2 episodes. . Lived in a different house. Then again pneumonia 5 years ago in same house. Rx in urgent care. Had high fever. Current started abruptly 2 weeks into ofev andhe wondering if due to ofev. Did not have classic Ofev sid effects.  Mentioned to him Ofev reduces riks of flare.  - gardening: no longer gardening  - featherpillow - got rid of it even after last visit   -visible mold - denies but he discussed his crawl space. Says there is no mold bu fungs on the top wall. 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 Biopsy indicated if stable but first rheum consult    04/26/2021- Interim hx  Patient presents today for ILD follow-up. He has been on OFEV for three weeks, currently taking 150mg  twice daily. He has been off oral prednisone  for two weeks. Breathing  is not significantly worse off steroids. Cough is the same. Since Saturday he developed chills, fatigue and low grade fever at night. Temp 100.1-100.3. He reprots some heavy breathing. Oxygen 92% or higher. Urinary frequency. Having some incontinence. Continues on Gabapentin for chronic cough. Referred to rheumatology, has an apt beginning of June. He has been avoid exposures mold/mildew, gardening.     05/02/2021 Follow up : ILD , Fever  Patient presents for an acute office visit.  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  Serial CT chest from August 2020 to August 2022 showed progressive changes.  Serology was negative except for a positive rheumatoid factor.  He has been referred to rheumatology with consult pending.  Pulmonary function testing showed significant restriction and decreased DLCO.  Patient started Ofev on March 01, 2021.  Patient was admitted early March with severe weakness dyspnea and activity intolerance.  He also had intermittent confusion hypoxemia and a fever Tmax of 102.  He was felt to have an ILD flare with possible viral illness however COVID, influenza and viral panel were all negative.  He was treated with steroids.  CT chest showed fibrotic ILD stable since October 2022.  His Ofev was held during admission.  After discharge he said that his energy level was improved and had decreased oxygen demands.  Based on oxygen after discharge was oxygen 2 L with activity and at bedtime.  Not requiring oxygen at rest with O2 saturations greater than 90%.  Patient was restarted on Ofev April 05, 2020.  After 2 weeks of been on Ofev patient developed low-grade fever.  This is waxed and waned and yesterday developed a fever Tmax of 103.  Patient went to the emergency room work-up was unrevealing.  CT chest showed stable ILD with no acute process.  CBC was normal blood cultures are no growth to date.   Respiratory viral panel was negative.  Lactic acid was 1.5 urinalysis was normal, urine culture is pending.  LFTs were slightly elevated.  Since last night patient says he is feeling better. Fever is resolved. Took Tylenol  . O2 saturations are 100%.  Has some sore throat that started late night .  Had good appetite today . No fever today . No increased Oxygen demands.  Wife is [name].  Patient denies any rash, skin lesions, skin redness, joint swelling, insect or tick bite, travel, discolored mucus, abdominal pain, nausea vomiting or diarrhea. Has no previous joint replacement or surgeries. Abdominal hernia repair >61yrs ago. No abd pain,  Followed by cardiology at [facility].  2D echo January 25, 2018 3 aortic valve is thickened with mild to moderate aortic valve stenosis.,  EF 55 to 60%.  Right ventricle is normal systolic function is normal.     OV 05/19/2021  Subjective:  Patient  ID: Dustin Dean, male , DOB: 1950-04-18 , age 45 y.o. , MRN: [id number] , ADDRESS: [address] [place] [id number] PCP Willian Righter, MD Patient Care Team: Willian Righter, MD as PCP - General (Family Medicine)  This Provider for this visit: Treatment Team:  Attending Provider: Geronimo Amel, MD    05/19/2021 -   Chief Complaint  Patient presents with   Follow-up    PFT performed today.  Pt states since stopping the OFEV, he has not had anymore fever and states overall he has felt good.    Chronic cough on gabapentin  - well controlled  Interstitial lung disease work-up in progress -concern for hypersensitive pneumonitis  -Progressive phenotype between August 2020 and October 2022   - started ofev mid feb 2023 -> early march 2023 admitted for ILD flare ->   9 mm right lower lobe nodule seen in October 2022 for the first time: Stable January 2023  HPI Montavis Schubring 73 y.o. -returns with wife [name].  He tells me that after last visit he did a rechallenge with nintedanib and he picked up fever and  ended up in the emergency room very similar to March 2023 hospitalization.  Symptoms started 1-2 weeks into taking nintedanib.  His wife feels strongly this is nintedanib related.  Have not seen the patient gets significant side effects from nintedanib but have seen one of the patient gets something similar with pirfenidone .  That particular patient had even after 1 dose.  I think the strength of evidence here is that this systemic inflammatory response is probably related to nintedanib.  Therefore he has stopped it.  I have advised him against taking nintedanib ever again.  Clinically he is feeling stable.  His walking desaturation test is stable.  His pulmonary function test is also stable.  He uses 2 L with exertion but today in office when we walked him on room air he dropped 7 points and this is similar to before.  He wants to try pirfenidone .  We have discussed this in the past.  We also discussed prednisone  but he does not want to do prednisone  because of hyperglycemia.  We discussed the risk, benefits and limitations of pirfenidone .  Pirfenidone  is not well studied in chronic HP.  Is only studied in 1 subtype of non-IPF progressive phenotype.  Nevertheless is the only other antifibrotic option available.  He wants to try a donor sample.  We will give him 2 months worth.  He is traveling to the beach  once he comes back he will take it    07/03/2021 Follow up : ILD  Patient returns for a 6-week follow-up.  Patient has underlying interstitial lung disease with progressive phenotype.  Serial CT chest from August 20 to August 2022 showed progressive changes.  Serology was negative except for positive rheumatoid factor.  Rheumatology consult is pending.  Previous PFTs have showed significant restriction and decreased diffusing capacity.  Patient was started on Ofev March 01, 2021.  Patient had a SIRS like response to OFEV x 2 .  Ofev has been stopped indefinitely and advised not to restart.  Patient was  placed on a drug holiday.  Started on Esbriet  1 month ago.  Patient says since starting Esbriet  he seems to be tolerating very well.  LFTs prior to start had returned back to normal.  Previously were elevated.  Patient says he has had minimal nausea.  No diarrhea no upset stomach and appetite seems to be normal.  Patient  says he has been using sunscreen when he is out side.  Has had no rash.  We discussed returning for ongoing labs. Patient has been referred to pulmonary rehab but has not contacted today. Patient says overall he is doing okay.  Wears his oxygen with activity as needed.  And at bedtime.  Has had no increased oxygen demands. Patient says he still gets short of breath with heavy activities.  But feels like he is stable at this point.  Has had no return of fever.      OV 09/08/2021  Subjective:  Patient ID: Dustin Dean, male , DOB: [birth year] , age 37 y.o. , MRN: [id number] , ADDRESS: [address] [place] [id number] PCP Willian Righter, MD Patient Care Team: Willian Righter, MD as PCP - General (Family Medicine)  This Provider for this visit: Treatment Team:  Attending Provider: Geronimo Amel, MD    09/08/2021 -   Chief Complaint  Patient presents with   Follow-up    Pt states he has been doing okay since last visit and denies any complaints.      HPI Stanlee Roehrig 73 y.o. -returns for follow-up.  He tells me that he is lost more weight.  He is stable.  His symptom score shows good stability.  He is tolerating pirfenidone  really well.  He has lost weight.  Current BMI is 31 but it is all intentional with the help of exercise, pirfenidone  and also eating less calories.  No side effects from pirfenidone  no sunburn nothing.  Is taking 3 to 2 pills 3 times daily.  He is willing to roll over to 1 big pill 3 times daily.  His main questions were -He wants to do some gardening such as cut wine but he assures me that will not be any exposure to organic dust.  -He  wants to donate his body after death to medical school.  I advised him that either [facility] or [facility] is fine.  -He also has aortic stenosis.  He saw cardiology today at [facility].  He saw Dr. Sherron Champ.  He is being referred to surgery at [facility] health for TAVR consideration.  According to the discussion [not clearly apparent in the chart review] TAVR is being recommended early because of his pulmonary fibrosis.  I did explain to him that in advance pulmonary fibrosis surgical risk is high but did want him to ask the surgeon if there is any downside to operating or placing TAVR procedure when the aortic stenosis is not critical.  PFT   OV 11/17/2021  Subjective:  Patient ID: Dustin Dean, male , DOB: 1950-02-15 , age 23 y.o. , MRN: [id number] , ADDRESS: [address] [place] [id number] PCP Willian Righter, MD Patient Care Team: Willian Righter, MD as PCP - General (Family Medicine)  This Provider for this visit: Treatment Team:  Attending Provider: Geronimo Amel, MD    11/17/2021 -   Chief Complaint  Patient presents with   Follow-up    PFT performed today.  Pt states he has multiple concerns to discuss.     HPI Theophile Harvie 73 y.o. -returns for follow-up.  He is doing well overall.  However for the last 3 days he has had a runny nose and with that he is having decreased energy.  He is got decreased social interest.  He also feels his memory is failing him more in the last 3 days he has noted some memory changes for  the last several months but it is a lot worse in the last 3 days since the runny nose.  His COVID is negative but there is no fever brown sputum or yellow sputum.  There is no significant drainage.  There is no worsening shortness of breath there is no worsening cough there is no chills.  In addition he has new complaint of tender itchy scalp for the last 3 weeks there is no rash but he has an appointment with dermatologist because  his scalp is dry and flaky.  He is not doing gardening but he does go out occasionally in the sun and he does not wear sunscreen or hat.  He is on pirfenidone  which can cause skin rash and especially in the setting of sun exposure.  I did caution him this could be because of pirfenidone  but he is going to see a dermatologist anyways.  Outside of this he is tolerating his pirfenidone  well.  I did indicate to him he can roll himself into 1 big pill 3 times daily instead of 3 small pills 3 times daily for a total of 9 pills.  He also told me that he is at risk for depression.  He says in the past he has had significant depression he is on antidepressant.  He is worried this can come back with his ILD.  We talked about seeing Adina Sol clinical psychologist who specializes in medical issues he is willing to see him.  Also discussed about doing light therapy  Preoperative evaluation: He has aortic stenosis.  He is saying now that it could be critical.  Apparently TAVR procedure is being planned date unknown.  Currently because of the cold he has suspended pulmonary rehabilitation he wants to know if he can go back.  I did tell him we need to get clearance from the cardiologist because of critical aortic stenosis history I thought it was moderate in the past.          OV 02/26/2022  Subjective:  Patient ID: Dustin Dean, male , DOB: [birth year] , age 27 y.o. , MRN: [id number] , ADDRESS: [address] [place] [id number] PCP Willian Righter, MD Patient Care Team: Willian Righter, MD as PCP - General (Family Medicine)  This Provider for this visit: Treatment Team:  Attending Provider: Geronimo Amel, MD    HPI Dustin Dean 73 y.o. -presents for follow-up.  Presents with his wife.  His wife is an independent historian today.  Since I last saw him in November 2023 several issues have happened.  1 in mid November 2023 had sinus congestion and he was given doxycycline  and prednisone .  Then  in mid December 2023 he had TAVR procedure for his aortic stenosis.  He says this went really well and his murmur has now resolved.  Then straight after Christmas 2023 he had COVID-19.  WE called in molnupiravir .  He is tolerating his pirfenidone  at 801 mg pill 3 times daily without any problems.  He has lost some weight intentionally.  He does admit that after all this he is slightly more short of breath than before..  In fact when he walked him he started desaturating.  This is a new finding.  He had pulmonary function test today that shows significant decline in his DLCO.  This was done today and is captured below.  His most recent hemoglobin on 02/20/2022 was reviewed done at [facility].  Was 13.6 g% and normal.  We discussed the possible need of  right heart catheterization.  When he was in the office he did not recall that he actually had a right heart catheterization.  After he left the office when I reviewed the records I noticed that in October 2023 he did have heart catheterization.  At that time he did have a right heart catheterization before the TAVR.  In this the pulmonary capillary wedge pressure was 11 mmHg, pulmonary artery mean pressure was 27 mmHg and elevated, Fick cardiac index was 3.3 mL.  It was in this catheterization that they saw that he had severe aortic valve stenosis with aortic valve area of 1.05 cm and a valve gradient of 40 mmHg.  He did have follow-up echocardiogram on 02/08/2022.  His left ventricular ejection fraction is 55-60%.  Right ventricle is normal.  There is TAVR bioprosthetic valve.  No pericardial effusion.       OV 04/08/2022  Subjective:  Patient ID: Dustin Dean, male , DOB: August 13, 1950 , age 73 y.o. , MRN: [id number] , ADDRESS: [address] [place] [id number] PCP Willian Righter, MD Patient Care Team: Willian Righter, MD as PCP - General (Family Medicine)  This Provider for this visit: Treatment Team:  Attending Provider: Geronimo Amel,  MD   02/26/2022 -   Chief Complaint  Patient presents with   Follow-up    COVID end of December 2023.  Aortic valve replacement (TAVR)  01/02/22.  Cough persistent.  Runny nose improved after seeing ENT.    Latest Reference Range & Units 02/17/16 19:20 04/26/21 15:31 05/01/21 19:55 12/20/21 12:34 01/09/22 23:37  Eosinophils Absolute 0.0 - 0.5 K/uL 0.4 0.3 0.2 0.6 0.4    Anxiety and depression  -Seeing Adina Dianna Finn psychology  04/08/2022 -   Chief Complaint  Patient presents with   Follow-up    Pt is here for follow up. Pt states no adverse effects noted so far for the Esbriet .      HPI Agustin Swatek 73 y.o. - returns for followup. Still on esbriet . Symotoms stable. Was suppsoed to have had RHC but on day of procedure DrSam Turner had multiple other patient emergencies. So it is rescheduled. Date pending.  Here to review PFT and CT Due to concern for progression. CT is definitel progressed from a year ago - despite esbriet .  PFT worse since a year ago but since covidd ? Rebounding back. We discuseed disease progression . Eplained next step is to try immune modulator. 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 We took shared decision to awaith RHC. Reommended he hold off clinical trial at this point. Cough still + but says is better with gabapentin. He does have high eos and we will discuss this next visit.  Obesity: losing weight  Wife also present and was independent historian     OV 05/22/2022  Subjective:  Patient ID: Dustin Dean, male , DOB: 02/07/1950 , age 73 y.o. , MRN: [id number] , AD[address]ESS: [address] [place] [id number] PCP Willian Righter, MD Patient Care Team: Willian Righter, MD as PCP - General (Family Medicine)  This Provider for this visit: Treatment Team:  Attending Provider: Geronimo Amel, MD -   05/22/2022 -   Chief Complaint   Patient presents with   Follow-up    F/up on PFT     HPI Kamren Heintzelman 73 y.o. -returns for follow-up for his above issues.  Since  his last visit he had his right heart catheterization.  It confirmed pulmonary hypertension present even after his TAVR procedure.  We ordered treprostinil.  He got the package last week and is going to start it tonight after the [employer] agent visits with him and sets up the package for him.  He is looking forward to starting it.  Currently tolerating pirfenidone  well.  He is going through cardiac rehab.  He states his stamina is improved his weight has come down.  His shortness of breath is better.  All reflected below.  Even the cough is better on Neurontin.  He is now seeing counselor Lorrene Hasten and is feeling better.  He had pulmonary function test and it shows continued recent stability.  This is also described below.  He continues to use portable oxygen.  Recently went to [facility] and did some unloading and he did not need oxygen.  Today made him do a sit/stand test x 10 times.  He did not desaturate below 93%.  OV 06/19/22  29-year-old male followed for interstitial lung disease with progressive phenotype (diagnosed in 2019) Seen for pulmonary consult to establish for ILD in January 2023.  Has chronic respiratory failure on oxygen History of prostate cancer.  History of aortic valve stenosis followed by cardiology at [facility] video visit is a 1 month follow-up for pulmonary hypertension related ILD patient says since last visit he is doing better.  Has completed pulmonary rehab.  And now is beginning a new exercise class at local gym.  Patient remains on Esbriet .  He says he has no issues.  Appetite is good with no nausea vomiting or diarrhea.  He was recently started on Tyvaso. Patient is doing well. Completing pulmonary rehab.  Going to a new exercise class Tolerating Tyvaso well.  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  Remains on oxygen 2 L with activity and at bedtime.  OV 08/21/2022  Subjective:  Patient ID: Dustin Dean, male , DOB: 02/25/1950 , age 27 y.o. , MRN: [id number] , ADDRESS: [address] [place] [id number] PCP Willian Righter, MD Patient Care Team: Willian Righter, MD as PCP - General (Family Medicine)  This Provider for this visit: Treatment Team:  Attending Provider: Geronimo Amel, MD  08/21/2022 -   Chief Complaint  Patient presents with   Follow-up    Currently in hosp. wants to discuss treatment.    Type of visit: Video Virtual Visit Identification of patient Bauer Ausborn with August 31, 1950 and MRN [id number] - 2 person identifier Risks: Risks, benefits, limitations of telephone visit explained. Patient understood and verbalized agreement to proceed Anyone else on call: just patient Patient location: [facility] inpatient status [facility] This provider location: [address]; [place]; [place] [address]. [facility]. [phone]     HPI Theodoro Koval 73 y.o. -on this video visit as a follow-up video visit because he is now an inpatient at [facility] which is not part of [place].  He converted the onsite visit to a video visit.  He told me 08/16/2022 and confirmed on review of the external medical records he got admitted with sudden desaturations.  He has been treated with steroids he was requiring 6 L.  He has somewhat improved.  He wants to get discharged but he says when they walked him with 3 L nasal cannula he desaturated. Labs at  [facility] creat normal. CTA angio - negative for PE. Says RVP negaive. He feels he is getting better. He wants to go home. I d/w  over phone with Dr Todd Duet of ccm there. Not on his rounding list but will review with primary/. Care everywhere does not indicate echo/bnp - indicated for this to be done + Consider HRCT + PCCM consult there with Dr  Duet HARPER Aug 2024   09/05/2022 Patient presents today for hospital follow-up.  Patient was admitted for community-acquired pneumonia and discharged on 08/22/22. He originally presents to ED with low oxygen levels. CTA negative for PE, possible infiltrate.  He completed course of cefepime and doxycycline . He had a video visit with Dr. Geronimo on 08/21/22 while admitted to [facility]. He was advised to stay on prednisone  until follow-up in clinic.He is oxygen dependent for hx chronic respiratory failure. He was discharged on 6L oxygen; he is currently using 2L oxygen at rest and 3L with exertion. Needs large home oxygen concentrator. Continues Esbriet  and Tyvaso as directed. Patient had PFT on July 30th prior to admission. Echo/ bnp norm   OV 12/20/2022  Subjective:  Patient ID: Dustin Dean, male , DOB: Jan 20, 1950 , age 19 y.o. , MRN: [id number] , AD[address]ESS: [address] [place] [id number] PCP Willian Righter, MD Patient Care Team: Willian Righter, MD as PCP - General (Family Medicine)  This Provider for this visit: Treatment Team:  Attending Provider: Geronimo Amel, MD   12/20/2022 -   Chief Complaint  Patient presents with   Follow-up    Pft and ct f/u. Pt denies any concerns      HPI Leandre Wien 73 y.o. -returns for follow-up.  Presents with his wife who is an independent historian.  I personally saw him in May 2024 but since then much has happened.  He got admitted to hospital with pulmonary fibrosis flareup.  Since then he is on 4-5 L continuous.  Today turned his oxygen off he was 87% room air at rest.  Better soon as he moves he says he desaturates.  His lung function has significantly declined.  His FVC is down 16% since February 2024.  He had a CT scan of the chest just before Thanksgiving 2024 and our radiologist thought he is not worse compared to earlier this year but definitely worse compared to a year ago but his history and his lung function show clearly he  is declining.  He stopped taking inhaled treprostinil for his WHO group 3 pulmonary hypertension because of severe cough.  However he is tolerating his pirfenidone  well.  He lives with his wife.  There is a young man and his wife who are basically like surrogate children.  That is a social support.  He says at this point in time he is on this attending that his life expectancy is diminished.  We had a long conversation about goals of care.  Talked about therapeutic options of chronic prednisone , CellCept and transplant.  He is not interested in any of this.  This because of side effect profile.  He wanted know about his life expectancy.  I did tell him there is a 10 fold increase risk of death in January 15, 2024 because of his significant decline.  I did tell him that I will be very surprised if he is alive 2 years from now and modestly surprised if he was alive a year from now.  We talked about the concept of home palliative care.  We  talked about the concept of hospice.  He has done a living will however he has not done a MOST form.  I did explain to him to consider himself no CPR and no intubation but accept BiPAP and simple medical treatment.  Also offered the concept of home palliative care at this point in time and if further decline this can be converted to hospice.  He is processing all this information.  His wife is in tears.  Patient himself states he is realistic.    Discussed medicare hospice benefit  - a medicare paid benefit  - for people with terminal qualifying  illness such as IPF, COPD, cancer with statistical prognosis < 6 months for which there is no cure - utilization of hospice shows people live longer paradoxically than those without hospice due to improved attention  - explained hospice locations - home, residential etc.,   - explained respite care options for caregivers  - explained that she could still get treatment for non-hospice diagnosis and still come to office to see me for  hospice related diagnosis that i provide support for  - explained that hospice provides nursing, MD, chaplain, volunteer and medications and supplies paid through medicare      He has some urgent continence.  He wants to know if he can take Myrbetriq.  I told him that he could tell his urologist he could.     OV 03/21/2023  Subjective:  Patient ID: Dustin Dean, male , DOB: August 16, 1950 , age 44 y.o. , MRN: [id number] , ADDRESS: [id number] [place] [id number] PCP Willian Righter, MD Patient Care Team: Willian Righter, MD as PCP - General (Family Medicine)  This Provider for this visit: Treatment Team:  Attending Provider: Geronimo Amel, MD    03/21/2023 -   Chief Complaint  Patient presents with   Follow-up    Interstitial lung disease, wants to see have everything is going with lungs, had pft done today     HPI Derrich Gaby 73 y.o. -returns for follow-up.  Presents with his wife.  He is saying that he is actually doing quite well.  And he is stable.  His energy levels are better.  He is tolerating pirfenidone  well.  His oxygen requirements are stable 5 L at rest and 5 L with exertion.  Occasionally if he overexerts he will go down to 87% on 5 L.  He has no major concerns.  We discussed clinical trials as a care option  alify for it.      OV 06/18/2023  Subjective:  Patient ID: Dustin Dean, male , DOB: May 22, 1950 , age 32 y.o. , MRN: [id number] , ADDRESS: [address] [place] [id number] PCP Willian Righter, MD Patient Care Team: Willian Righter, MD as PCP - General (Family Medicine)  This Provider for this visit: Treatment Team:  Attending Provider: Geronimo Amel, MD  06/18/2023 -   Chief Complaint  Patient presents with   Interstitial Lung Disease     HPI Oland Arquette 73 y.o. -presents for follow-up.  He had a right heart catheterization repeat because he was intolerant to time also we wanted to see if he would qualify for recent study but  his PVR was low less than 4 and therefore he did not qualify.  Currently he is on pirfenidone .  Overall he is stable.  See symptom score.  Excess hypoxemia continues without change.  He pulmonary function test and it is stable.  At this point in time we  decided to take supportive care approach with continued pirfenidone .  In the future Nerandomilast might be approved and if he has progression or he wants to switch over we could try that.  Does not much appetite and in his situation for CellCept at this point.  Pulmonary function test also stable probably since last July.  Answered several questions he and his wife had.       OV 08/16/2023  Subjective:  Patient ID: Dustin Dean, male , DOB: [birth year] , age 40 y.o. , MRN: [id number] , ADDRESS: [address] [place] [id number] PCP Willian Righter, MD Patient Care Team: Willian Righter, MD as PCP - General (Family Medicine)  This Provider for this visit: Treatment Team:  Attending Provider: Geronimo Amel, MD    08/16/2023 -   Chief Complaint  Patient presents with   Follow-up    PFT and ILD Pt states he is doing overall okay     HPI Nakhi Choi 73 y.o. -returns for follow-up.  Presents with his wife.  He and his wife report that he is overall okay.  On 07/13/2023 in the interim since last visit he ended up in the ER at [facility].  He presented with flulike symptoms.  His creatinine and liver function test was normal.  COVID and flu test were negative.  Troponin was normal.  BNP was normal.  He had CT angiogram of the chest and there is no pulmonary embolism and he was discharged.  This was confirmed by external record review.  He says his significant issues   - Respiratory he feels stable.  Requiring oxygen at rest 5 L. -  he complains of intermittent nonspecific dizziness for the last 1 year.  This around the same time he started Aubagio for his pulmonary hypertension.  There is a new complaint - He is also  complaining of memory issues.  I noticed that he is on gabapentin - He is also complains of easy fatigue and napping during the day.  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  He has received the consent is reflecting on it.  He is not sure.        OV 12/31/2023  Subjective:  Patient ID: Dustin Dean, male , DOB: [birth year] , age 80 y.o. 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  -Progressive phenotype between August 2020 and October 2022   - started ofev mid feb 2023 -> early march 2023 admitted for ILD flare -> rechallenged ofev April 2023 -> ER vist SIRS   - SAE with OFev/ Ofev stopped  - started Pirfenioned May 2023 (rjected prednisone  due to side effec tprofile)  - transplaint discussion marrch 2-24 and December 2024- not interested -   -Not interested in transplant, chronic prednisone  and CellCept December 2024  9 mm right lower lobe nodule seen in October 2022 for the first time: Stable January 2023 repoted as fibrosis ->Not reported n feb 2024   8 mm lateral subpleural left lower lobe nodule (5/93),-> unchanged from 08/30/2017.-> through feb 2024  WHO-3 Pulm HTN # OCT 2023  - [facility] 10/16/21 preTVAV ->  MARCH 2024 POST TAVR  -  PA mean 27 -> 27 - PCWP 11 ->12 - CI 3.3 PVR ->  3.5 -Taking time also approximately September 2024  #May 2025 - RA = 6 RV = 36/10 PA = 35/21 (27) PCW = 9 Fick cardiac output/index = 6.2/3.0 Thermo CO/CI = 7.3/3.6 PVR = 2.5 WU (Thermo) Ao sat = 99% PA sat = 70%, 74% High SVC sat = 72% PAPi = 4.7  *   - severe aortic stenosis -> post TAVR:  Status post TAVR procedure for aortic valve stenosis mid December 2023.  Respiratory exacerbations/infections  -Mid November 2023: Sinus congestion and given doxycycline  and prednisone   -01/09/2022: COVID-19 and given molnupiravir  E  -Summer 2024 admission  - Fall/winter 2025 hospitalization at [place].  RAST allergy  panel December 2023 -Mildly positive for cat and dog dander.  Otherwise negative.  Normal IgE'. ELevaed Eos  High risk med  - Esbriet /Pirfenidone  requires intensive drug monitoring due to high concerns for Adverse effects of , including  Drug Induced Liver Injury, significant GI side effects that include but not limited to Diarrhea, Nausea, Vomiting,  and other system side effects that include Fatigue, headaches, weight loss and other side effects such as skin rash. These will be monitored with  blood work such as LFT initially once a month for 6 months and then quarterly   12/31/2023 -   Chief Complaint  Patient presents with   Interstitial Lung Disease    Pt states since LOV breathing has not improved feels like it is declining  SOB occurs w/ any activity Dry cough Pt is currently on 5L of 02 poc     HPI Cordon Gassett 73 y.o. 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  Also dealing with chronic cough and pulmonary fibrosis who presents with worsening shortness of breath.  In terms of ILD Since his last visit on 12/02/2023 got hospitalized for respiratory exacerbation and since then has returned to baseline.  Although in terms of his baseline he and his wife attest He has experienced worsening shortness of breath compared to three months ago, particularly upon exertion. Despite using five liters of oxygen at rest, his oxygen saturation drops to 90-92% during activity and recently fell to 88% during a hospital visit when he was unable to move.  He continues his pirfenidone  for pulmonary fibrosis and  tolerates it well.  I presented of the fact Nerandomilast got recently approved but at this point in time he is stable and therefore we would consider this if it gets FDA approved for fibrosing progressive phenotype and if he has progression despite pirfenidone .  In terms of cough  - He has a chronic cough that has persisted for approximately twenty years. Although he finds it bothersome, he has become accustomed to it. He recalls a recent hospitalization where he was prescribed DuoNeb for use with a nebulizer, which he has not yet started.   In terms of pulmonary hypertension: - His past medical history includes aortic stenosis for which he underwent a TAVR procedure two years ago. A right heart catheterization in May 2025 showed  pulmonary hypertension. He previously tried Tyvaso but discontinued it due to intolerance.  We discussed the new also treprostinil but made through 3D printing technology called YUTREPIA.  Because of the small particle size the manufacture argues that side effect profile is less.  I did tell him that I had 2 patients on the inhaler.  I told him it is approved  for pH ILD but the evidence is not directly on this product.  Of the 2 patients I did try 1 did not tolerated and had the same side effects as time also and the other 1 is doing well and found significant benefit from this new inhaler.  He is willing to try this.      SYMPTOM SCALE - ILD 01/24/2021 02/23/2021   04/05/2021 Post hopsilaization. On pred taper and off ofev Ofev 150 twice daily; No prednsone  05/19/2021 214# 11/17/2021  02/26/2022 205# 04/08/2022 199# 05/22/2022 199# - cardica reab 12/20/2022  03/21/2023  06/18/2023 Right heart cath done shows low PVR with normal wedge, mean PA 27 08/16/2023  12/31/2023   Current weight         esbiret esbriet  esbiret Esbietr - going to start tyvaso tonight Esbriet , sildenafil. OFF tyvaso Esbriet , sildenafil Pirfenidone  and sildenafil Esbriet , oxygen and sildenafil Esbriet , 5 L  oxygen and sildenafil no change  O2 use 2L [place] with ex 2L with ex 2L Wth ex at hoe 2L            Shortness of Breath 0 -> 5 scale with 5 being worst (score 6 If unable to do)                 At rest 0 0 0 1 0 0 0 0 0 - 0 0 0 0  Simple tasks - showers, clothes change, eating, shaving 1 1 0.5 2 1 1  0 0 0 2 0 1 1 2.5  Household (dishes, doing bed, laundry) 1 1 1 2 2 1 1 1 1 3 1 2 1 3   Shopping 2 1 0 NA 1 1 1 1 1 3 1 1 1  na  Walking level at own pace 5 2 0 1 (with oxygen) 1 2 2 1  0 3 2 2 2 2   Walking up Stairs 5 3 1  with o2, 6 without o2 2 (with oxygen) 2 3 3 2 2 4 4 4 5  na  Total (30-36) Dyspnea Score 14 8   8 7 8 7 5  with o2 4 with o2 15 8 10 10  worse  How bad is your cough? 2 3 0 with gbapentin 1-2 (on gabapentin) 2 3 2 3 1 4 1 2 3 3   How bad is your fatigue 1 2 0 4 1 4 0 0 0 3  1 20 3 2   How bad is nausea 00000 0 0 0 0 0 0 0 0 0  0 0 0 00  How bad is vomiting?   0 0 0 0 0 0 0 0 0 0  0 0 00 0  How bad is diarrhea? 0 0 0 0 0 0 0 0 0 0  0 0 0 0  How bad is anxiety? 0 0 0 0 0 2 0 1 3 0  0 1 00 1  How bad is depression 0 0 0 0 (on antidepressant) 1 2 0 1 3 0 0 0 0 0   Any chronic pain - if so where and how bad 0 0 0 0 0  1 x x x 0 0 0 0     Simple office walk 185 feet x  3 laps goal with forehead probe 01/24/2021  04/05/2021 On prendisone. Off ofev 05/19/2021 Off ofev 09/08/2021 On esbiret since may 2023 02/26/2022 Esbiret since may 2023 04/06/22 05/22/2022 Full dose Esbriet .  Going to start treprostinil 12/20/2022 87-89% RA at rest  O2 used Ra at rest, desat at 1 lap and  then walked with 2L piulse Ra - being on room air for 15 min and in mddile of pred burst taper   ra ra Room air   Number laps completed 1 lap on RA, then 3 laps on 2L Did all 3 laps   1 of 3 laps - forehead probe 3 of 3 laps Sit/stand x 10 times   Comments about pace x avg  avg av     Resting Pulse Ox/HR 96% and 88/min at 1 lap 98% an Hr 92 98% AND HR 81 98% and HR 77 94% 94% and HR 82 98 send and heart rate 81   Final Pulse Ox/HR 92%  and 115/min on 3 laps on 2L pulsed 92% and HR 117 91% and HR 115 92% and HR 113 88% 88% and HR 109 93% and heart rate 102   Desaturated </= 88% no no no no      Desaturated <= 3% points yes Yes, 6  Yes 7  Yes 6 pots      Got Tachycardic >/= 90/min yes yes yes avg      Symptoms at end of test Modreate dyspnea Modate dyspnea Mild dyspnea Mild dyspnea   Level 2 out of 10 dyspnea   Miscellaneous comments Corrected with 2L [place] Did not need o2, improved              SIT STAND TEST - goal 15 times   06/18/2023    O2 used ra   PRobe - finter or forehead fnger   Number sit and stand completed - goal 15 15   Time taken to complete slow   Resting Pulse Ox/HR/Dyspnea  92% and 80/min and dyspnea of 0/10    Peak measures 85 % and 115/min and dyspnea of 6/10   Final Pulse Ox/HR 74% and 112/min and dyspnea of 6/10   Desaturated </= 88% yes   Desaturated <= 3% points yes   Got Tachycardic >/= 90/min ydes   Miscellaneous comments x     PFT     Latest Ref Rng & Units 12/31/2023    8:21 AM 08/16/2023    9:51 AM 06/13/2023    1:06 PM 03/21/2023   11:24 AM 12/18/2022    4:26 PM 08/14/2022   10:53 AM 05/22/2022    9:53 AM  PFT Results  FVC-Pre L 1.92  P 1.78  1.81  1.86  1.69  1.88  2.02   FVC-Predicted Pre % 47  P 43  44  45  41  45  48   Pre FEV1/FVC % % 80  P 88  89  92  89  86  90   FEV1-Pre L 1.54  P 1.56  1.61  1.72  1.50  1.62  1.81   FEV1-Predicted Pre % 51  P 52  54  57  49  53  59   DLCO uncorrected ml/min/mmHg 9.28  P 11.61    10.20  15.13  14.22   DLCO UNC% % 38  P 47    41  62  57   DLCO corrected ml/min/mmHg 10.39  P 12.55    10.20  15.13  14.38   DLCO COR %Predicted % 42  P 51    41  62  58   DLVA Predicted % 82  P 90    72  109  103     P Preliminary result       LAB RESULTS last 96 hours No results found.  has a past medical history of Anxiety, Aortic stenosis, Flu, Pneumonia, Prostate cancer (HCC), Pulmonary fibrosis (HCC), and Skin cancer.   reports that he has  never smoked. He has been exposed to tobacco smoke. He has never used smokeless tobacco.  Past Surgical History:  Procedure Laterality Date   HERNIA REPAIR     NASAL SEPTUM SURGERY     RIGHT HEART CATH N/A 05/24/2023   Procedure: RIGHT HEART CATH;  Surgeon: Cherrie Toribio SAUNDERS, MD;  Location: [facility]AS[facility];  Service: Cardiovascular;  Laterality: N/A;   SKIN CANCER EXCISION      Allergies[1]  Immunization History  Administered Date(s) Administered   Fluad Quad(high Dose 65+) 10/23/2017, 09/30/2019, 10/14/2020, 10/18/2020   Influenza-Unspecified 11/11/2009, 11/08/2011, 11/03/2013, 11/22/2014, 11/10/2015   Moderna Covid-19 Fall Seasonal Vaccine 50yrs & older 10/05/2022   Moderna Covid-19 Vaccine Bivalent Booster 47yrs & up 10/24/2020   Moderna Sars-Covid-2 Vaccination 03/19/2019, 04/16/2019, 11/16/2019, 05/07/2020   Pfizer(Comirnaty)Fall Seasonal Vaccine 12 years and older 10/19/2021   Pneumococcal Conjugate-13 12/08/2007, 12/21/2015, 09/04/2018   Pneumococcal Polysaccharide-23 12/08/2007, 06/21/2017, 06/16/2018   Tdap 12/08/2007, 11/27/2018   Zoster Recombinant(Shingrix) 10/30/2013, 09/04/2018, 11/27/2018   Zoster, Live 10/30/2013   Zoster, Unspecified 10/30/2013    Family History  Problem Relation Age of Onset   Cancer Mother    Congestive Heart Failure Father    Prostate cancer Father    Healthy Sister     Current Medications[2]      Objective:   Vitals:   12/31/23 1358  BP: 118/70  Pulse: 79  Temp: 97.9 F (36.6 C)  TempSrc: Oral  SpO2: 97%  Weight: 200 lb (90.7 kg)  Height: 5' 8 (1.727 m)    Estimated body mass index is 30.41 kg/m as calculated from the following:   Height as of this encounter: 5' 8 (1.727 m).   Weight as of this encounter: 200 lb (90.7 kg).  @WEIGHTCHANGE @  [employer]   12/31/23 1358  Weight: 200 lb (90.7 kg)     Physical Exam   General: No distress. Looks same O2 at rest: yes 5L Cane present: no Sitting in wheel  chair: no Frail: no Obese: no Neuro: Alert and Oriented x 3. GCS 15. Speech normal Psych: Pleasant Resp:  Barrel Chest - no.  Wheeze - no, Crackles - YES BASE and UL, No overt respiratory distress CVS: Normal heart sounds. Murmurs - no Ext: Stigmata of Connective Tissue Disease - no HEENT: Normal upper airway. PEERL +. No post nasal drip        Assessment/     Assessment & Plan ILD (interstitial lung disease) (HCC)  Encounter for therapeutic drug monitoring  Chronic respiratory failure with hypoxia (HCC)  WHO group 3 pulmonary arterial hypertension (HCC)    PLAN Patient Instructions  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 Pulmonary air trapping Chronic respiratory failure with hypoxia (HCC) Rheumatoid factor positive Encounter for therapeutic drug monitoring  - - needing 5L [place] at all times no change here but there is possibility of worsening pulmonary hypertension or fibrosis - noted tyvaso intolerance -Tolerating esbriet  quite well.  Plan (shared decision making)  --Continue pirfenidone  801mg  x 3 times daily  - take with food  - space 5-6 hours apart  - apply sunscreen  - wear hat  - hydrate well   - do LFT  12/31/2023  - START YUTREPIA  - discussed this as inhaled teprostinil similar to tyvaso but made from 3D printing  - maybe  you tolerate it better than tyvaso but maybe not  - During this time we will stop sildenafil while on YUTREPIA  - - discussed transplant, chronic prednisone  and cellcept at prior ist  - respect refusal   - do spirometyr and dlco in 14 week   WHO-Group 3 pulmonary hypertension   - s Plan  - -   DO NOT Qualify or PHOCUS study with MOSLICIGUAT v Placebo  DUE TO LOW PVR  - let us  see if YUTREPIA makes a difference; START THIS  Memory issues And Easy day time somnolence Dizziness  Plan  - talk to PCP Willian Righter, MD   Follow-up  - 14  weeks 30-minute visit with Dr.  Geronimo but after spirometry and DLCO.    FOLLOWUP    Return for  - 14  weeks 30-minute visit with Dr. Geronimo .  ( Level 05 visit E&M 2024: Estb >= 40 min in  visit type: on-site physical face to visit  in total care time and counseling or/and coordination of care by this undersigned MD - Dr Dorethia Geronimo. This includes one or more of the following on this same day 12/31/2023: pre-charting, chart review, note writing, documentation discussion of test results, diagnostic or treatment recommendations, prognosis, risks and benefits of management options, instructions, education, compliance or risk-factor reduction. It excludes time spent by the CMA or office staff in the care of the patient. Actual time45 min)   SIGNATURE    Dr. Dorethia Geronimo, M.D., F.C.C.P,  Pulmonary and Critical Care Medicine Staff Physician, [facility] Director - Interstitial Lung Disease  Program  Pulmonary Fibrosis [facility] Network at [facility] [place], [place], [address]  Pager: [phone], If no answer or between  15:00h - 7:00h: call [phone] Telephone: [phone]  6:05 PM 12/31/2023     [1]  Allergies Allergen Reactions   Bee Venom Rash   Shellfish Allergy  Hives   Nintedanib Other (See Comments)    SIRS, fevers   Pseudoephedrine Anxiety  [2]  Current Outpatient Medications:    acetaminophen  (TYLENOL ) 500 MG tablet, Take 500 mg by mouth every 8 (eight) hours as needed for moderate pain (pain score 4-6)., Disp: , Rfl:    aspirin EC 81 MG tablet, Take 81 mg by mouth daily. Swallow whole., Disp: , Rfl:    Calcium Carbonate-Vitamin D (CALCIUM 600-VITAMIN D3 PO), Take 1 tablet by mouth daily., Disp: , Rfl:    cetirizine (ZYRTEC) 10 MG tablet, Take 10 mg by mouth daily., Disp: , Rfl:    ESBRIET  801 MG TABS, Take 1 tablet (801 mg total) by mouth 3 (three) times daily with meals., Disp: 270 tablet, Rfl: 2   fluorouracil (EFUDEX) 5 % cream, Apply 1  Application topically 2 (two) times daily as needed (irritation)., Disp: , Rfl:    fluticasone (FLONASE) 50 MCG/ACT nasal spray, Place 2 sprays into both nostrils daily. (Patient taking differently: Place 2 sprays into both nostrils daily. As needed), Disp: , Rfl:    gabapentin (NEURONTIN) 300 MG capsule, Take 900 mg by mouth 3 (three) times daily., Disp: , Rfl:    ipratropium (ATROVENT) 0.06 % nasal spray, Place 2 sprays into both nostrils 3 (three) times daily., Disp: , Rfl:    loperamide (IMODIUM) 2 MG capsule, Take 1 mg by mouth daily., Disp: , Rfl:    meclizine (ANTIVERT) 25 MG tablet, Take 25 mg by mouth 2 (two) times daily as needed for dizziness.,  Disp: , Rfl:    Misc Natural Products (ELDERBERRY IMMUNE COMPLEX PO), Take 2 tablets by mouth daily., Disp: , Rfl:    Multiple Vitamin (MULTIVITAMIN) tablet, Take 1 tablet by mouth daily., Disp: , Rfl:    omeprazole (PRILOSEC) 40 MG capsule, Take 40 mg by mouth daily., Disp: , Rfl:    PARoxetine (PAXIL) 20 MG tablet, Take 20 mg by mouth daily., Disp: , Rfl:    rosuvastatin (CRESTOR) 5 MG tablet, Take 5 mg by mouth daily., Disp: , Rfl:    sildenafil (REVATIO) 20 MG tablet, Take 20 mg by mouth 3 (three) times daily., Disp: , Rfl:    SUMAtriptan (IMITREX) 100 MG tablet, Take 100 mg by mouth every 2 (two) hours as needed for migraine., Disp: , Rfl:    ipratropium-albuterol  (DUONEB) 0.5-2.5 (3) MG/3ML SOLN, Take 3 mLs by nebulization every 6 (six) hours as needed. (Patient not taking: Reported on 12/31/2023), Disp: 360 mL, Rfl: 2

## 2023-12-31 NOTE — Progress Notes (Signed)
Spiro/DLCO performed today. 

## 2023-12-31 NOTE — Patient Instructions (Signed)
Spiro/DLCO performed today. 

## 2023-12-31 NOTE — Patient Instructions (Addendum)
 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 Pulmonary air trapping Chronic respiratory failure with hypoxia (HCC) Rheumatoid factor positive Encounter for therapeutic drug monitoring  - - needing 5L [place] at all times no change here but there is possibility of worsening pulmonary hypertension or fibrosis - noted tyvaso intolerance -Tolerating esbriet  quite well.  Plan (shared decision making)  --Continue pirfenidone  801mg  x 3 times daily  - take with food  - space 5-6 hours apart  - apply sunscreen  - wear hat  - hydrate well   - do LFT  12/31/2023  - START YUTREPIA  - discussed this as inhaled teprostinil similar to tyvaso but made from 3D printing  - maybe you tolerate it better than tyvaso but maybe not  - During this time we will stop sildenafil while on YUTREPIA  - - discussed transplant, chronic prednisone  and cellcept at prior ist  - respect refusal   - do spirometyr and dlco in 14 week   WHO-Group 3 pulmonary hypertension   - s Plan  - -   DO NOT Qualify or PHOCUS study with MOSLICIGUAT v Placebo  DUE TO LOW PVR  - let us  see if YUTREPIA makes a difference; START THIS  Memory issues And Easy day time somnolence Dizziness  Plan  - talk to PCP Willian Righter, MD   Follow-up  - 14  weeks 30-minute visit with Dr. Geronimo but after spirometry and DLCO.

## 2024-01-03 ENCOUNTER — Telehealth: Payer: Self-pay

## 2024-01-03 NOTE — Telephone Encounter (Signed)
 Received referral for new start Yutrepia due to Tyvaso intolerance. Opening benefits investigation in this thread.

## 2024-01-06 NOTE — Telephone Encounter (Signed)
 Received notification from [employer] regarding a prior authorization for YUTREPIA. Authorization has been APPROVED from 01/06/2024 to 01/05/2025. Approval letter sent to scan center.  Sherry Pennant, PharmD, MPH, BCPS, CPP Clinical Pharmacist

## 2024-01-06 NOTE — Telephone Encounter (Signed)
 Submitted a Prior Authorization request to [facility] for YUTREPIA via CoverMyMeds. Will update once we receive a response.  Key: Dustin Dean

## 2024-01-07 ENCOUNTER — Encounter: Payer: Self-pay | Admitting: Behavioral Health

## 2024-01-07 ENCOUNTER — Ambulatory Visit (INDEPENDENT_AMBULATORY_CARE_PROVIDER_SITE_OTHER): Admitting: Behavioral Health

## 2024-01-07 DIAGNOSIS — F411 Generalized anxiety disorder: Secondary | ICD-10-CM

## 2024-01-07 DIAGNOSIS — F331 Major depressive disorder, recurrent, moderate: Secondary | ICD-10-CM

## 2024-01-07 NOTE — Telephone Encounter (Signed)
 Copied from CRM #[id number]. Topic: Clinical - Prescription Issue >> Jan 07, 2024 11:04 AM Rilla NOVAK wrote: Reason for CRM: When patient was in with Dr Geronimo, he talked about Olga, and patient has not heard anything more about it from office.  Was looking online and found it is VERY expensive.  Unless there is a program, patient would not be ale to afford.  Please call  @ [phone].   Routing to pharmacy.

## 2024-01-07 NOTE — Progress Notes (Signed)
 Chief Technology Officer Health Counselor Initial Adult Exam  Name: Dustin Dean Date: 01/07/2024 MRN: [id number] DOB: [birth year] PCP: Willian Righter, MD  Time spent: 1 PM until 1:56 PM, 56 minutes spent face-to-face in the outpatient therapist office.  Feeling better.  He met with his pulmonologist and the pulmonologist felt things are going well.  He told the patient there was a experimental drug which might be released in the spring which would help slow down the progression of his pulmonary fibrosis.  He said that bili is fair to little bit.  He did not necessarily ask about expected lifespan saying he chose not to living every day fully and he feels like he is doing that.  He is spending time with friends and family.  He and his wife are going to church on Christmas Eve.  He is spending Christmas Day with his adopted son and family.  He is still grieving the loss of his dog but he feels that has healthy grief and knows that never completely goes away.  He feels that he is making good life choices and doing some of the things that he has said he wanted to do that he can do in terms of his energy and stamina and with the oxygen that he is on.  He reports minimal anxiety and minimal irritability. He does contract for safety having no thoughts of hurting himself or anyone else.   Guardian/Payee: Self  Paperwork requested: Yes   Reason for Visit /Presenting Problem: Depression  . He does contract for safety having no thoughts of hurting himself or anyone else. Mental Status Exam: Appearance:   Fairly Groomed     Behavior:  Appropriate  Motor:  Normal  Speech/Language:   Clear and Coherent and Normal Rate  Affect:  Appropriate  Mood:  normal  Thought process:  normal  Thought content:    WNL  Sensory/Perceptual disturbances:    WNL  Orientation:  oriented to person, place, time/date, situation, and day of week  Attention:  Good  Concentration:  Good  Memory:  WNL  Fund of knowledge:    Good  Insight:    Good  Judgment:   Good  Impulse Control:  Good     Reported Symptoms: Depression  Risk Assessment: Danger to Self:  No Self-injurious Behavior: No Danger to Others: No Duty to Warn:no Physical Aggression / Violence:No  Access to Firearms a concern: No  Gang Involvement:No  Patient / guardian was educated about steps to take if suicide or homicide risk level increases between visits: an/a While future psychiatric events cannot be accurately predicted, the patient does not currently require acute inpatient psychiatric care and does not currently meet [place]  involuntary commitment criteria.  Substance Abuse History: Current substance abuse: No     Past Psychiatric History:   Previous psychological history is significant for depression Outpatient Providers: Primary care physician History of Psych Hospitalization: None reported Psychological Testing: an/a   Abuse History:  Victim of: No., None reported   Report needed: No. Victim of Neglect:No. Perpetrator of n/a Witness / Exposure to Domestic Violence: None reported  Protective Services Involvement: No  Witness to [employer] Violence:  No   Family History:  Family History  Problem Relation Age of Onset   Cancer Mother    Congestive Heart Failure Father    Prostate cancer Father    Healthy Sister     Living situation: the patient lives with their spouse  Sexual Orientation: Straight  Relationship Status: married  Name of spouse / other: Did not discuss If a parent, number of children / ages: Adults  Support Systems: spouse  Financial Stress:  No   Income/Employment/Disability: Doctor, General Practice: No   Educational History: Education: high school diploma/GED  Religion/Sprituality/World View: Did not discuss  Any cultural differences that may affect / interfere with treatment:  not applicable   Recreation/Hobbies: Time with family  Stressors: Health  problems    Strengths: Supportive Relationships, Hopefulness, Self Advocate, and Able to Communicate Effectively  Barriers:     Legal History: Pending legal issue / charges: The patient has no significant history of legal issues. History of legal issue / charges: Not applicable  Medical History/Surgical History: reviewed Past Medical History:  Diagnosis Date   Anxiety    Aortic stenosis    Flu    Pneumonia    Prostate cancer (HCC)    Pulmonary fibrosis (HCC)    Skin cancer     Past Surgical History:  Procedure Laterality Date   HERNIA REPAIR     NASAL SEPTUM SURGERY     RIGHT HEART CATH N/A 05/24/2023   Procedure: RIGHT HEART CATH;  Surgeon: Cherrie Toribio SAUNDERS, MD;  [facility];  Service: Cardiovascular;  Laterality: N/A;   SKIN CANCER EXCISION      Medications: Current Outpatient Medications  Medication Sig Dispense Refill   acetaminophen  (TYLENOL ) 500 MG tablet Take 500 mg by mouth every 8 (eight) hours as needed for moderate pain (pain score 4-6).     aspirin EC 81 MG tablet Take 81 mg by mouth daily. Swallow whole.     Calcium Carbonate-Vitamin D (CALCIUM 600-VITAMIN D3 PO) Take 1 tablet by mouth daily.     cetirizine (ZYRTEC) 10 MG tablet Take 10 mg by mouth daily.     ESBRIET  801 MG TABS Take 1 tablet (801 mg total) by mouth 3 (three) times daily with meals. 270 tablet 2   fluorouracil (EFUDEX) 5 % cream Apply 1 Application topically 2 (two) times daily as needed (irritation).     fluticasone (FLONASE) 50 MCG/ACT nasal spray Place 2 sprays into both nostrils daily. (Patient taking differently: Place 2 sprays into both nostrils daily. As needed)     gabapentin (NEURONTIN) 300 MG capsule Take 900 mg by mouth 3 (three) times daily.     ipratropium (ATROVENT) 0.06 % nasal spray Place 2 sprays into both nostrils 3 (three) times daily.     ipratropium-albuterol  (DUONEB) 0.5-2.5 (3) MG/3ML SOLN Take 3 mLs by nebulization every 6 (six) hours as needed.  (Patient not taking: Reported on 12/31/2023) 360 mL 2   loperamide (IMODIUM) 2 MG capsule Take 1 mg by mouth daily.     meclizine (ANTIVERT) 25 MG tablet Take 25 mg by mouth 2 (two) times daily as needed for dizziness.     Misc Natural Products (ELDERBERRY IMMUNE COMPLEX PO) Take 2 tablets by mouth daily.     Multiple Vitamin (MULTIVITAMIN) tablet Take 1 tablet by mouth daily.     omeprazole (PRILOSEC) 40 MG capsule Take 40 mg by mouth daily.     PARoxetine (PAXIL) 20 MG tablet Take 20 mg by mouth daily.     rosuvastatin (CRESTOR) 5 MG tablet Take 5 mg by mouth daily.     sildenafil (REVATIO) 20 MG tablet Take 20 mg by mouth 3 (three) times daily.     SUMAtriptan (IMITREX) 100 MG tablet Take 100 mg by mouth every 2 (two) hours as needed  for migraine.     No current facility-administered medications for this visit.    Allergies  Allergen Reactions   Bee Venom Rash   Shellfish Allergy  Hives   Nintedanib Other (See Comments)    SIRS, fevers   Pseudoephedrine Anxiety    Diagnoses:  Major depressive disorder, recurrent, moderate, irritability Plan of Care: I will meet with the patient every 2 to 3 weeks in person Treatment plan: We will use cognitive behavioral therapy, even though some therapy as well as elements of dialectical behavior therapy to help the patient reduce his anxiety by at least 50% with a target date of  October 30th 2024.  Goals will be to help the patient better manage anxiety symptoms and stress, identify causes for anxiety including changes in his health and ways to lower the anxiety, resolve the conflicts contributing to anxiety and help him manage thoughts and worrisome thinking contributing to his anxiety.  Interventions include providing education about anxiety to help him understand its causes symptoms and triggers..  We will facilitate problem solution skills as well as coping skills to help him manage anxiety and stress symptoms.  We will use cognitive behavioral  therapy to identify and change anxiety provoking thought and behavior patterns as well as dialectical therapy to teach distress tolerance and mindfulness skills. Progress: 45% We will continue with treatment goals as listed above with a new target date of May 14, 2024                                  Lorrene CHRISTELLA Hasten, [facility]                  Lorrene CHRISTELLA Hasten, [facility]               Lorrene CHRISTELLA Hasten, [facility]               Lorrene CHRISTELLA Hasten, [facility]               Lorrene CHRISTELLA Hasten, [facility]               Lorrene CHRISTELLA Hasten, [facility]               Lorrene CHRISTELLA Hasten, [facility]               Lorrene CHRISTELLA Hasten, [facility]               Lorrene CHRISTELLA Hasten, [facility]               Lorrene CHRISTELLA Hasten, [facility]               Lorrene CHRISTELLA Hasten, [facility]               Lorrene CHRISTELLA Hasten, [facility]               Lorrene CHRISTELLA Hasten, [facility]               Lorrene CHRISTELLA Hasten, [facility] Counselor Initial Adult Exam  Name: Dustin Dean Date: 01/07/2024 MRN: [id number] DOB: 01-14-1951 PCP: Willian Righter, MD  Time spent:     spent in person with the patient in the outpatient therapist office.  He does contract for safety having no thoughts of hurting himself or anyone else.   Guardian/Payee: Self  Paperwork requested: Yes   Reason for Visit /Presenting Problem: Depression  . He does contract for safety having no thoughts of  hurting himself or anyone else. Mental Status Exam: Appearance:   Fairly Groomed     Behavior:  Appropriate  Motor:  Normal  Speech/Language:   Clear and Coherent and Normal Rate  Affect:  Appropriate  Mood:  normal  Thought process:  normal  Thought content:    WNL  Sensory/Perceptual disturbances:    WNL  Orientation:  oriented to person, place, time/date, situation, and day of week  Attention:  Good   Concentration:  Good  Memory:  WNL  Fund of knowledge:   Good  Insight:    Good  Judgment:   Good  Impulse Control:  Good     Reported Symptoms: Depression  Risk Assessment: Danger to Self:  No Self-injurious Behavior: No Danger to Others: No Duty to Warn:no Physical Aggression / Violence:No  Access to Firearms a concern: No  Gang Involvement:No  Patient / guardian was educated about steps to take if suicide or homicide risk level increases between visits: n/a While future psychiatric events cannot be accurately predicted, the patient does not currently require acute inpatient psychiatric care and does not currently meet [place]  involuntary commitment criteria.  Substance Abuse History: Current substance abuse: No     Past Psychiatric History:   Previous psychological history is significant for depression Outpatient Providers: Primary care physician History of Psych Hospitalization: None reported Psychological Testing: n/a   Abuse History:  Victim of: No., None reported   Report needed: No. Victim of Neglect:No. Perpetrator of n/a Witness / Exposure to Domestic Violence: None reported  Protective Services Involvement: No  Witness to [employer] Violence:  No   Family History:  Family History  Problem Relation Age of Onset   Cancer Mother    Congestive Heart Failure Father    Prostate cancer Father    Healthy Sister     Living situation: the patient lives with their spouse  Sexual Orientation: Straight  Relationship Status: married  Name of spouse / other: Did not discuss If a parent, number of children / ages: Adults  Support Systems: spouse  Financial Stress:  No   Income/Employment/Disability: Doctor, General Practice: No   Educational History: Education: high school diploma/GED  Religion/Sprituality/World View: Did not discuss  Any cultural differences that may affect / interfere with treatment:  not applicable    Recreation/Hobbies: Time with family  Stressors: Health problems    Strengths: Supportive Relationships, Hopefulness, Self Advocate, and Able to Communicate Effectively  Barriers:     Legal History: Pending legal issue / charges: The patient has no significant history of legal issues. History of legal issue / charges: Not applicable  Medical History/Surgical History: reviewed Past Medical History:  Diagnosis Date   Anxiety    Aortic stenosis    Flu    Pneumonia    Prostate cancer (HCC)    Pulmonary fibrosis (HCC)    Skin cancer     Past Surgical History:  Procedure Laterality Date   HERNIA REPAIR     NASAL SEPTUM SURGERY     RIGHT HEART CATH N/A 05/24/2023   Procedure: RIGHT HEART CATH;  Surgeon: Cherrie Toribio SAUNDERS, MD;  Location: [facility];  Service: Cardiovascular;  Laterality: N/A;   SKIN CANCER EXCISION      Medications: Current Outpatient Medications  Medication Sig Dispense Refill   acetaminophen  (TYLENOL ) 500 MG tablet Take 500 mg by mouth every 8 (eight) hours as needed for moderate pain (pain score 4-6).     aspirin EC 81  MG tablet Take 81 mg by mouth daily. Swallow whole.     Calcium Carbonate-Vitamin D (CALCIUM 600-VITAMIN D3 PO) Take 1 tablet by mouth daily.     cetirizine (ZYRTEC) 10 MG tablet Take 10 mg by mouth daily.     ESBRIET  801 MG TABS Take 1 tablet (801 mg total) by mouth 3 (three) times daily with meals. 270 tablet 2   fluorouracil (EFUDEX) 5 % cream Apply 1 Application topically 2 (two) times daily as needed (irritation).     fluticasone (FLONASE) 50 MCG/ACT nasal spray Place 2 sprays into both nostrils daily. (Patient taking differently: Place 2 sprays into both nostrils daily. As needed)     gabapentin (NEURONTIN) 300 MG capsule Take 900 mg by mouth 3 (three) times daily.     ipratropium (ATROVENT) 0.06 % nasal spray Place 2 sprays into both nostrils 3 (three) times daily.     ipratropium-albuterol  (DUONEB) 0.5-2.5 (3) MG/3ML SOLN  Take 3 mLs by nebulization every 6 (six) hours as needed. (Patient not taking: Reported on 12/31/2023) 360 mL 2   loperamide (IMODIUM) 2 MG capsule Take 1 mg by mouth daily.     meclizine (ANTIVERT) 25 MG tablet Take 25 mg by mouth 2 (two) times daily as needed for dizziness.     Misc Natural Products (ELDERBERRY IMMUNE COMPLEX PO) Take 2 tablets by mouth daily.     Multiple Vitamin (MULTIVITAMIN) tablet Take 1 tablet by mouth daily.     omeprazole (PRILOSEC) 40 MG capsule Take 40 mg by mouth daily.     PARoxetine (PAXIL) 20 MG tablet Take 20 mg by mouth daily.     rosuvastatin (CRESTOR) 5 MG tablet Take 5 mg by mouth daily.     sildenafil (REVATIO) 20 MG tablet Take 20 mg by mouth 3 (three) times daily.     SUMAtriptan (IMITREX) 100 MG tablet Take 100 mg by mouth every 2 (two) hours as needed for migraine.     No current facility-administered medications for this visit.    Allergies  Allergen Reactions   Bee Venom Rash   Shellfish Allergy  Hives   Nintedanib Other (See Comments)    SIRS, fevers   Pseudoephedrine Anxiety    Diagnoses:  Major depressive disorder, recurrent, moderate, irritability Plan of Care: I will meet with the patient every 2 to 3 weeks in person Treatment plan: We will use cognitive behavioral therapy, even though some therapy as well as elements of dialectical behavior therapy to help the patient reduce his anxiety by at least 50% with a target date of  October 30th 2024.  Goals will be to help the patient better manage anxiety symptoms and stress, identify causes for anxiety including changes in his health and ways to lower the anxiety, resolve the conflicts contributing to anxiety and help him manage thoughts and worrisome thinking contributing to his anxiety.  Interventions include providing education about anxiety to help him understand its causes symptoms and triggers..  We will facilitate problem solution skills as well as coping skills to help him manage anxiety  and stress symptoms.  We will use cognitive behavioral therapy to identify and change anxiety provoking thought and behavior patterns as well as dialectical therapy to teach distress tolerance and mindfulness skills. Progress: 35% We will continue with treatment goals as listed above with a new target date of October 31st 2025  Lorrene CHRISTELLA Hasten, [facility]                  Lorrene CHRISTELLA Hasten, [facility]               Lorrene CHRISTELLA Hasten, [facility]               Lorrene CHRISTELLA Hasten, [facility]               Lorrene CHRISTELLA Hasten, [facility]               Lorrene CHRISTELLA Hasten, [facility]               Lorrene CHRISTELLA Hasten, [facility]               Lorrene CHRISTELLA Hasten, [facility]               Lorrene CHRISTELLA Hasten, [facility]               Lorrene CHRISTELLA Hasten, [facility]               Lorrene CHRISTELLA Hasten, [facility]               Lorrene CHRISTELLA Hasten, [facility]               Lorrene CHRISTELLA Hasten, [facility]               Lorrene CHRISTELLA Hasten, [facility]               Lorrene CHRISTELLA Hasten, [facility]               Lorrene CHRISTELLA Hasten, [facility]               Lorrene CHRISTELLA Hasten, [facility]               Lorrene CHRISTELLA Hasten, [facility]            Lorrene CHRISTELLA Hasten, [facility]               Lorrene CHRISTELLA Hasten, [facility]               Lorrene CHRISTELLA Hasten, [facility]               Lorrene CHRISTELLA Hasten, [facility]               Lorrene CHRISTELLA Hasten, [facility]               Lorrene CHRISTELLA Hasten, [facility]               Lorrene CHRISTELLA Hasten, [facility]               Lorrene CHRISTELLA Hasten, [facility]               Lorrene CHRISTELLA Hasten, [facility] "

## 2024-01-13 ENCOUNTER — Other Ambulatory Visit (HOSPITAL_COMMUNITY): Payer: Self-pay

## 2024-01-15 ENCOUNTER — Other Ambulatory Visit (HOSPITAL_COMMUNITY): Payer: Self-pay

## 2024-01-15 ENCOUNTER — Encounter: Payer: Self-pay | Admitting: Internal Medicine

## 2024-01-15 NOTE — Telephone Encounter (Signed)
 Pt enrolled in Pulmonary Hypertension grant through [facility]:  Amount: $6500 Award Period: 12/16/23 - 11/30-26 BIN: [id number] PCN: [id number] Group: [id number] ID: [id number]  Helpdesk phone #: [phone]  Will proceed with submitting enrollment form

## 2024-01-17 ENCOUNTER — Other Ambulatory Visit (HOSPITAL_COMMUNITY): Payer: Self-pay

## 2024-01-17 NOTE — Telephone Encounter (Signed)
 Received message from pt stating he has new insurance through [employer]. Submitted a Prior Authorization request to [employer] for YUTREPIA via CoverMyMeds. Will update once we receive a response.  Key: [id number]

## 2024-01-17 NOTE — Telephone Encounter (Signed)
 Received notification from [employer] regarding a prior authorization for YUTREPIA. Authorization has been APPROVED from 01/17/24 to 01/14/2025. Approval letter sent to scan center.

## 2024-01-21 ENCOUNTER — Other Ambulatory Visit (HOSPITAL_COMMUNITY): Payer: Self-pay

## 2024-01-21 NOTE — Telephone Encounter (Signed)
 Completed [employer] enrollment form and faxed with insurance cards, med list, pa approval and grant info to [facility]. Will update when we receive a response.  Phone #: [phone] Fax #: [phone]

## 2024-01-27 ENCOUNTER — Ambulatory Visit: Admitting: Behavioral Health

## 2024-01-27 ENCOUNTER — Encounter: Payer: Self-pay | Admitting: Behavioral Health

## 2024-01-27 DIAGNOSIS — F411 Generalized anxiety disorder: Secondary | ICD-10-CM

## 2024-01-27 DIAGNOSIS — F331 Major depressive disorder, recurrent, moderate: Secondary | ICD-10-CM

## 2024-01-27 IMAGING — DX DG CHEST 2V
2 series · 2 of 2 positions shown · non-contrast
Comparison: Chest x-ray [DATE] 03/21/2021 and chest CT 03/22/2021

CLINICAL DATA: Fever of unknown origin. History of pulmonary
fibrosis.

EXAM:
CHEST - 2 VIEW

[chest pa]
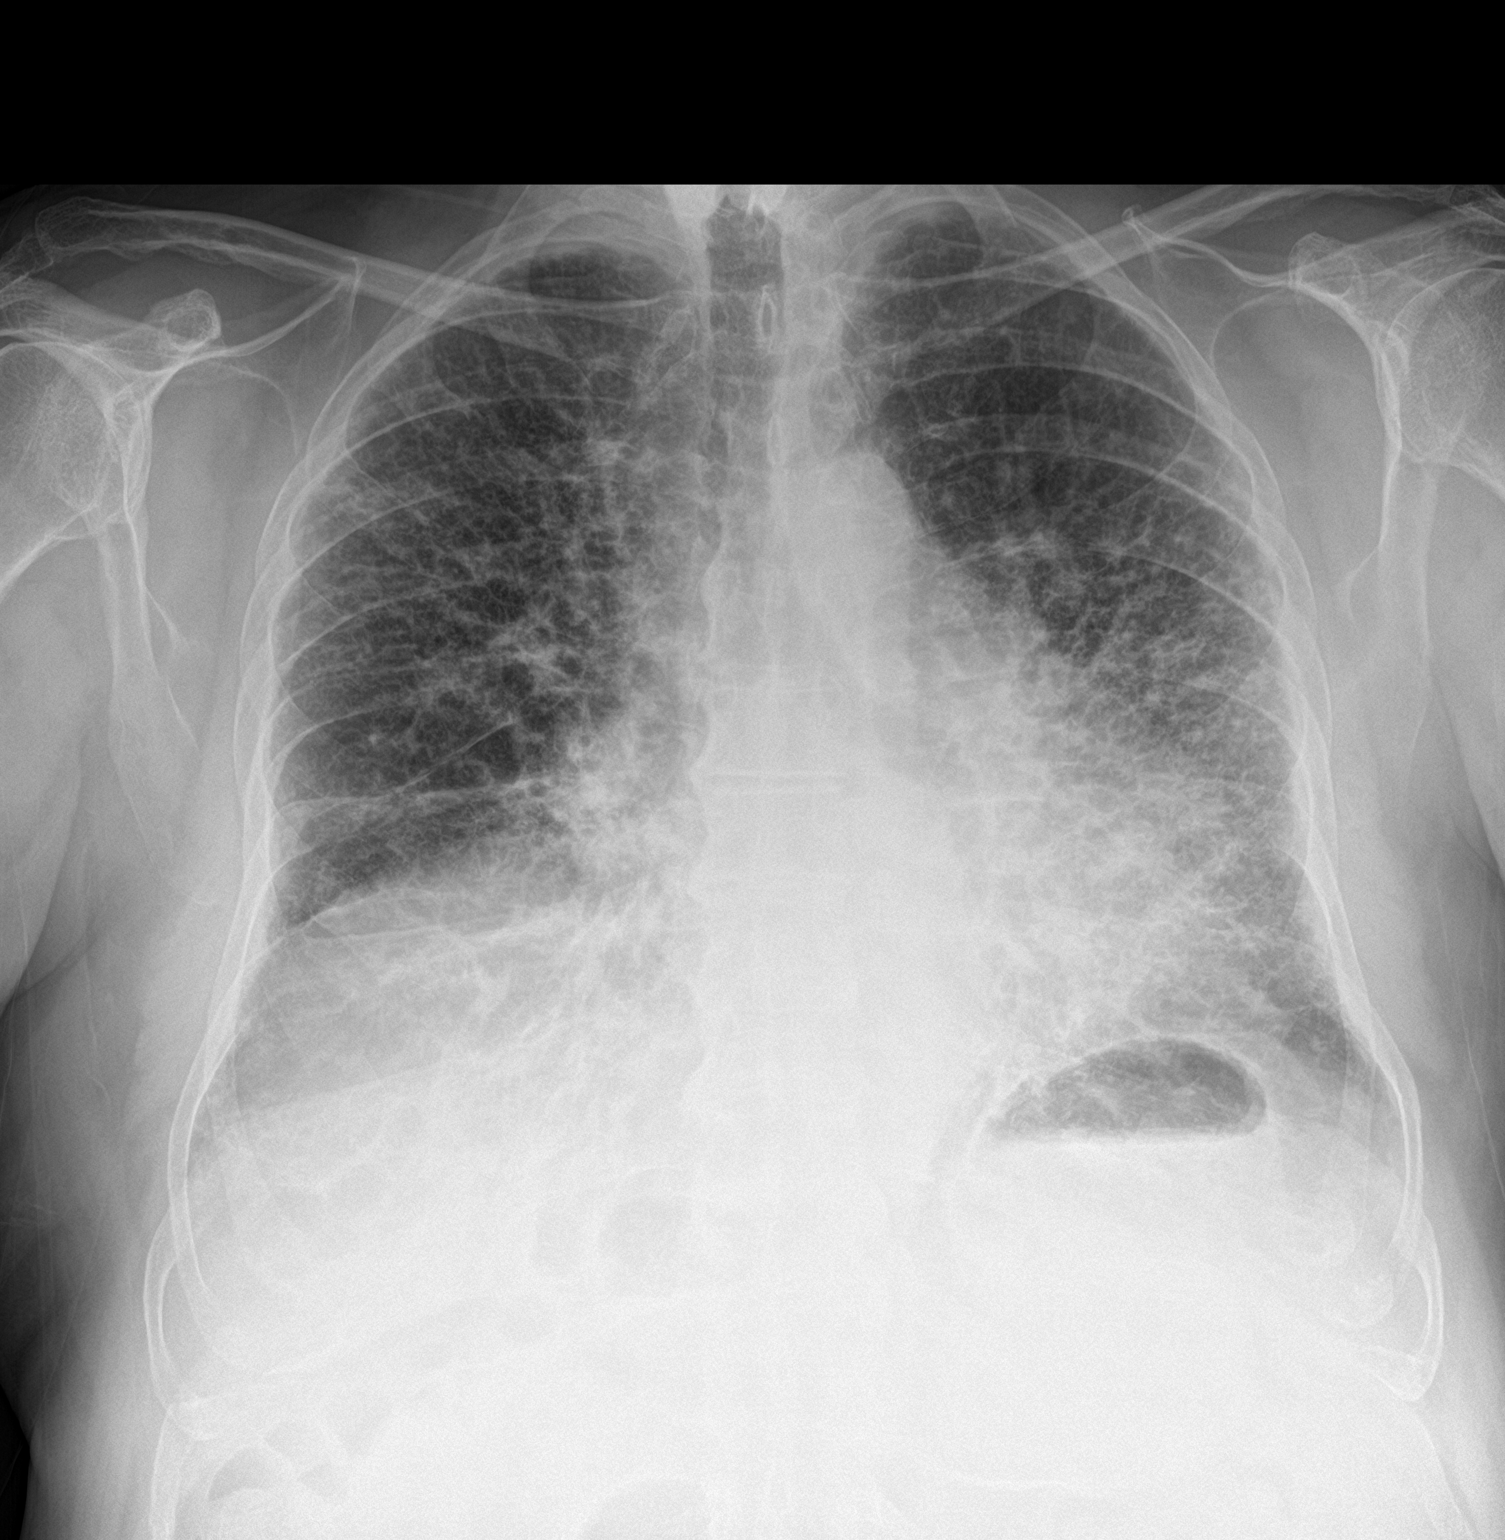

[chest lat]
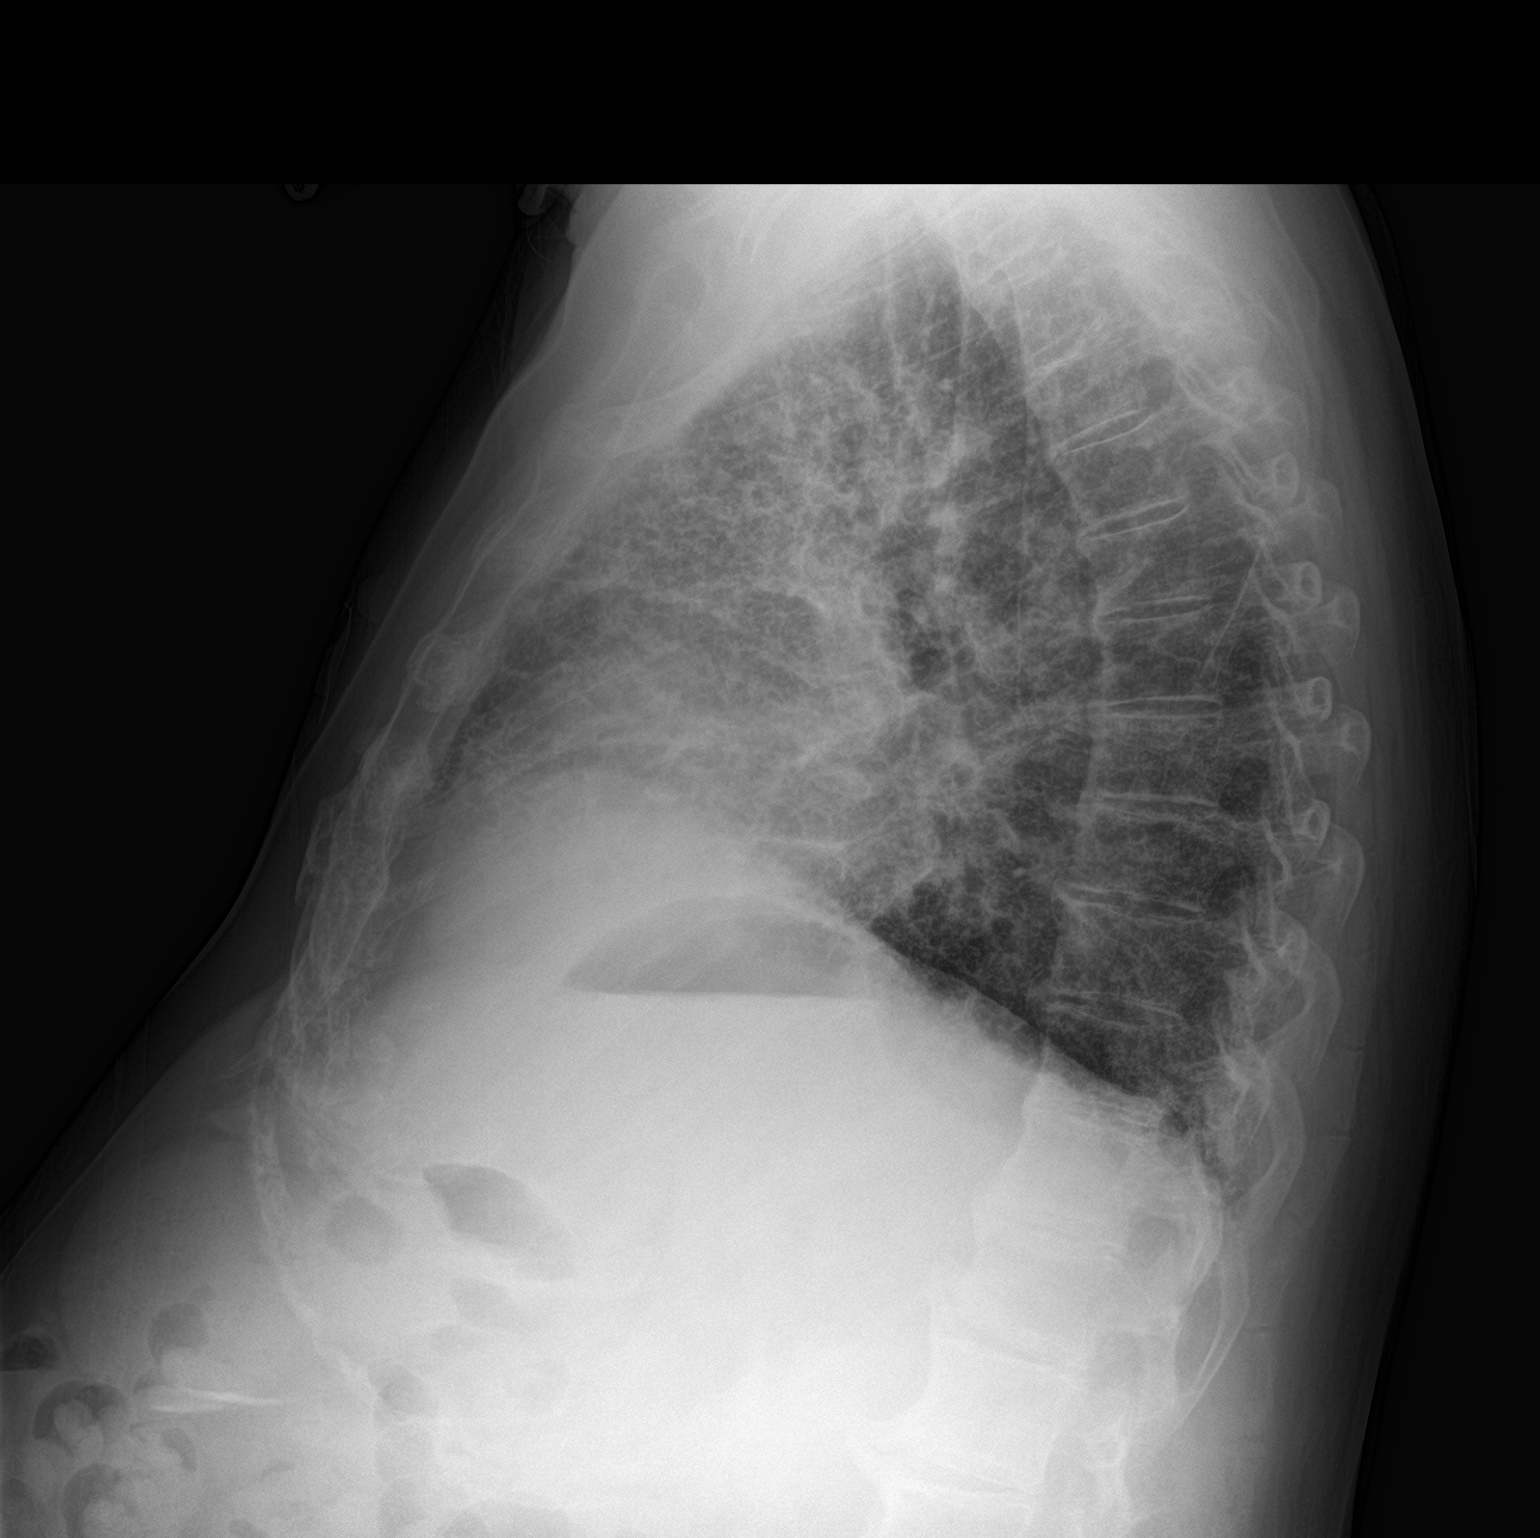

[2 of 2 positions shown; findings below may reference images not displayed]

FINDINGS: The cardiac silhouette, mediastinal hilar contours are within normal
limits and stable.

Stable advanced changes of pulmonary fibrosis with extensive
honeycombing. No definite acute superimposed pulmonary process is
identified but could be easy to miss. No pleural effusions. No
pneumothorax. The bony thorax is intact.
IMPRESSION: Advanced pulmonary fibrosis without definite acute overlying
pulmonary process.

## 2024-01-27 NOTE — Progress Notes (Signed)
 Chief [employer] Health Counselor Initial Adult Exam  Name: Dustin Dean Date: 01/27/2024 MRN: [id number] DOB: [birth year] PCP: Willian Righter, MD  Time spent: 3 PM until 3:56 PM, 56 minutes spent face-to-face in the outpatient therapist office. The patient's sister and brother-in-law were visiting and they have enjoyed their time together.  He says that he has been for days and they are leaving tomorrow and that is a good amount of time.  He acknowledges that he gets irritable with people easier than he used to but it is not significant and he does not think they have noticed the irritability.  He is just he and his wife have a routine and has been altered a little bit also.  Physically he reports no significant change.   A man and his family that he has been very close to and he sees as a son and grandchildren came over for Christmas and they had a great time.  They spend some time with friends over the holidays.  He is dog sitting and he says that still does remind him of his grief we talked more about that grief what type of dog it was and how he interacted with it.  Explored in general he is staying very positive and not putting a timeline on the pulmonary fibrosis.  He said that he was diagnosed in 2018 and average lifespan was 3 to 5 years and he is in year 7 almost year 8 and is making living every day that he can.  He does contract for safety having no thoughts of hurting himself or anyone else.   Guardian/Payee: Self  Paperwork requested: Yes   Reason for Visit /Presenting Problem: Depression  . He does contract for safety having no thoughts of hurting himself or anyone else. Mental Status Exam: Appearance:   Fairly Groomed     Behavior:  Appropriate  Motor:  Normal  Speech/Language:   Clear and Coherent and Normal Rate  Affect:  Appropriate  Mood:  normal  Thought process:  normal  Thought content:    WNL  Sensory/Perceptual disturbances:    WNL  Orientation:  oriented to  person, place, time/date, situation, and day of week  Attention:  Good  Concentration:  Good  Memory:  WNL  Fund of knowledge:   Good  Insight:    Good  Judgment:   Good  Impulse Control:  Good     Reported Symptoms: Depression  Risk Assessment: Danger to Self:  No Self-injurious Behavior: No Danger to Others: No Duty to Warn:no Physical Aggression / Violence:No  Access to Firearms a concern: No  Gang Involvement:No  Patient / guardian was educated about steps to take if suicide or homicide risk level increases between visits: an/a While future psychiatric events cannot be accurately predicted, the patient does not currently require acute inpatient psychiatric care and does not currently meet [place]  involuntary commitment criteria.  Substance Abuse History: Current substance abuse: No     Past Psychiatric History:   Previous psychological history is significant for depression Outpatient Providers: Primary care physician History of Psych Hospitalization: None reported Psychological Testing: an/a   Abuse History:  Victim of: No., None reported   Report needed: No. Victim of Neglect:No. Perpetrator of n/a Witness / Exposure to Domestic Violence: None reported  Protective Services Involvement: No  Witness to [employer] Violence:  No   Family History:  Family History  Problem Relation Age of Onset   Cancer Mother    Congestive Heart Failure  Father    Prostate cancer Father    Healthy Sister     Living situation: the patient lives with their spouse  Sexual Orientation: Straight  Relationship Status: married  Name of spouse / other: Did not discuss If a parent, number of children / ages: Adults  Support Systems: spouse  Financial Stress:  No   Income/Employment/Disability: Doctor, General Practice: No   Educational History: Education: high school diploma/GED  Religion/Sprituality/World View: Did not discuss  Any cultural  differences that may affect / interfere with treatment:  not applicable   Recreation/Hobbies: Time with family  Stressors: Health problems    Strengths: Supportive Relationships, Hopefulness, Self Advocate, and Able to Communicate Effectively  Barriers:     Legal History: Pending legal issue / charges: The patient has no significant history of legal issues. History of legal issue / charges: Not applicable  Medical History/Surgical History: reviewed Past Medical History:  Diagnosis Date   Anxiety    Aortic stenosis    Flu    Pneumonia    Prostate cancer (HCC)    Pulmonary fibrosis (HCC)    Skin cancer     Past Surgical History:  Procedure Laterality Date   HERNIA REPAIR     NASAL SEPTUM SURGERY     RIGHT HEART CATH N/A 05/24/2023   Procedure: RIGHT HEART CATH;  Surgeon: Cherrie Toribio SAUNDERS, MD;  Location[facility];  Service: Cardiovascular;  Laterality: N/A;   SKIN CANCER EXCISION      Medications: Current Outpatient Medications  Medication Sig Dispense Refill   acetaminophen  (TYLENOL ) 500 MG tablet Take 500 mg by mouth every 8 (eight) hours as needed for moderate pain (pain score 4-6).     aspirin EC 81 MG tablet Take 81 mg by mouth daily. Swallow whole.     Calcium Carbonate-Vitamin D (CALCIUM 600-VITAMIN D3 PO) Take 1 tablet by mouth daily.     cetirizine (ZYRTEC) 10 MG tablet Take 10 mg by mouth daily.     ESBRIET  801 MG TABS Take 1 tablet (801 mg total) by mouth 3 (three) times daily with meals. 270 tablet 2   fluorouracil (EFUDEX) 5 % cream Apply 1 Application topically 2 (two) times daily as needed (irritation).     fluticasone (FLONASE) 50 MCG/ACT nasal spray Place 2 sprays into both nostrils daily. (Patient taking differently: Place 2 sprays into both nostrils daily. As needed)     gabapentin (NEURONTIN) 300 MG capsule Take 900 mg by mouth 3 (three) times daily.     ipratropium (ATROVENT) 0.06 % nasal spray Place 2 sprays into both nostrils 3 (three)  times daily.     ipratropium-albuterol  (DUONEB) 0.5-2.5 (3) MG/3ML SOLN Take 3 mLs by nebulization every 6 (six) hours as needed. (Patient not taking: Reported on 12/31/2023) 360 mL 2   loperamide (IMODIUM) 2 MG capsule Take 1 mg by mouth daily.     meclizine (ANTIVERT) 25 MG tablet Take 25 mg by mouth 2 (two) times daily as needed for dizziness.     Misc Natural Products (ELDERBERRY IMMUNE COMPLEX PO) Take 2 tablets by mouth daily.     Multiple Vitamin (MULTIVITAMIN) tablet Take 1 tablet by mouth daily.     omeprazole (PRILOSEC) 40 MG capsule Take 40 mg by mouth daily.     PARoxetine (PAXIL) 20 MG tablet Take 20 mg by mouth daily.     rosuvastatin (CRESTOR) 5 MG tablet Take 5 mg by mouth daily.     sildenafil (  REVATIO) 20 MG tablet Take 20 mg by mouth 3 (three) times daily.     SUMAtriptan (IMITREX) 100 MG tablet Take 100 mg by mouth every 2 (two) hours as needed for migraine.     No current facility-administered medications for this visit.    Allergies  Allergen Reactions   Bee Venom Rash   Shellfish Allergy  Hives   Nintedanib Other (See Comments)    SIRS, fevers   Pseudoephedrine Anxiety    Diagnoses:  Major depressive disorder, recurrent, moderate, irritability Plan of Care: I will meet with the patient every 2 to 3 weeks in person Treatment plan: We will use cognitive behavioral therapy, even though some therapy as well as elements of dialectical behavior therapy to help the patient reduce his anxiety by at least 50% with a target date of  October 30th 2024.  Goals will be to help the patient better manage anxiety symptoms and stress, identify causes for anxiety including changes in his health and ways to lower the anxiety, resolve the conflicts contributing to anxiety and help him manage thoughts and worrisome thinking contributing to his anxiety.  Interventions include providing education about anxiety to help him understand its causes symptoms and triggers..  We will facilitate  problem solution skills as well as coping skills to help him manage anxiety and stress symptoms.  We will use cognitive behavioral therapy to identify and change anxiety provoking thought and behavior patterns as well as dialectical therapy to teach distress tolerance and mindfulness skills. Progress: 45% We will continue with treatment goals as listed above with a new target date of May 14, 2024                                  Lorrene CHRISTELLA Hasten, [facility]                  Lorrene CHRISTELLA Hasten, [facility]               Lorrene CHRISTELLA Hasten, [facility]               Lorrene CHRISTELLA Hasten, [facility]               Lorrene CHRISTELLA Hasten, [facility]               Lorrene CHRISTELLA Hasten, [facility]               Lorrene CHRISTELLA Hasten, [facility]               Lorrene CHRISTELLA Hasten, [facility]               Lorrene CHRISTELLA Hasten, [facility]               Lorrene CHRISTELLA Hasten, [facility]               Lorrene CHRISTELLA Hasten, [facility]               Lorrene CHRISTELLA Hasten, [facility]               Lorrene CHRISTELLA Hasten, [facility]               Lorrene CHRISTELLA Hasten, [facility] Counselor Initial Adult Exam  Name: Dustin Dean Date: 01/27/2024 MRN: [id number] DOB: [birth year] PCP: Willian Righter, MD  Time spent:     spent in person with the patient in the outpatient therapist office.  He does contract for safety having no thoughts  of hurting himself or anyone else.   Guardian/Payee: Self  Paperwork requested: Yes   Reason for Visit /Presenting Problem: Depression  . He does contract for safety having no thoughts of hurting himself or anyone else. Mental Status Exam: Appearance:   Fairly Groomed     Behavior:  Appropriate  Motor:  Normal  Speech/Language:   Clear and Coherent and Normal Rate  Affect:  Appropriate  Mood:  normal  Thought process:  normal  Thought content:    WNL  Sensory/Perceptual  disturbances:    WNL  Orientation:  oriented to person, place, time/date, situation, and day of week  Attention:  Good  Concentration:  Good  Memory:  WNL  Fund of knowledge:   Good  Insight:    Good  Judgment:   Good  Impulse Control:  Good     Reported Symptoms: Depression  Risk Assessment: Danger to Self:  No Self-injurious Behavior: No Danger to Others: No Duty to Warn:no Physical Aggression / Violence:No  Access to Firearms a concern: No  Gang Involvement:No  Patient / guardian was educated about steps to take if suicide or homicide risk level increases between visits: n/a While future psychiatric events cannot be accurately predicted, the patient does not currently require acute inpatient psychiatric care and does not currently meet [place]  involuntary commitment criteria.  Substance Abuse History: Current substance abuse: No     Past Psychiatric History:   Previous psychological history is significant for depression Outpatient Providers: Primary care physician History of Psych Hospitalization: None reported Psychological Testing: n/a   Abuse History:  Victim of: No., None reported   Report needed: No. Victim of Neglect:No. Perpetrator of n/a Witness / Exposure to Domestic Violence: None reported  Protective Services Involvement: No  Witness to [employer] Violence:  No   Family History:  Family History  Problem Relation Age of Onset   Cancer Mother    Congestive Heart Failure Father    Prostate cancer Father    Healthy Sister     Living situation: the patient lives with their spouse  Sexual Orientation: Straight  Relationship Status: married  Name of spouse / other: Did not discuss If a parent, number of children / ages: Adults  Support Systems: spouse  Financial Stress:  No   Income/Employment/Disability: Doctor, General Practice: No   Educational History: Education: high school  diploma/GED  Religion/Sprituality/World View: Did not discuss  Any cultural differences that may affect / interfere with treatment:  not applicable   Recreation/Hobbies: Time with family  Stressors: Health problems    Strengths: Supportive Relationships, Hopefulness, Self Advocate, and Able to Communicate Effectively  Barriers:     Legal History: Pending legal issue / charges: The patient has no significant history of legal issues. History of legal issue / charges: Not applicable  Medical History/Surgical History: reviewed Past Medical History:  Diagnosis Date   Anxiety    Aortic stenosis    Flu    Pneumonia    Prostate cancer (HCC)    Pulmonary fibrosis (HCC)    Skin cancer     Past Surgical History:  Procedure Laterality Date   HERNIA REPAIR     NASAL SEPTUM SURGERY     RIGHT HEART CATH N/A 05/24/2023   Procedure: RIGHT HEART CATH;  Surgeon: Cherrie Toribio SAUNDERS, MD;  Location: [facility];  Service: Cardiovascular;  Laterality: N/A;   SKIN CANCER EXCISION      Medications: Current Outpatient Medications  Medication Sig  Dispense Refill   acetaminophen  (TYLENOL ) 500 MG tablet Take 500 mg by mouth every 8 (eight) hours as needed for moderate pain (pain score 4-6).     aspirin EC 81 MG tablet Take 81 mg by mouth daily. Swallow whole.     Calcium Carbonate-Vitamin D (CALCIUM 600-VITAMIN D3 PO) Take 1 tablet by mouth daily.     cetirizine (ZYRTEC) 10 MG tablet Take 10 mg by mouth daily.     ESBRIET  801 MG TABS Take 1 tablet (801 mg total) by mouth 3 (three) times daily with meals. 270 tablet 2   fluorouracil (EFUDEX) 5 % cream Apply 1 Application topically 2 (two) times daily as needed (irritation).     fluticasone (FLONASE) 50 MCG/ACT nasal spray Place 2 sprays into both nostrils daily. (Patient taking differently: Place 2 sprays into both nostrils daily. As needed)     gabapentin (NEURONTIN) 300 MG capsule Take 900 mg by mouth 3 (three) times daily.      ipratropium (ATROVENT) 0.06 % nasal spray Place 2 sprays into both nostrils 3 (three) times daily.     ipratropium-albuterol  (DUONEB) 0.5-2.5 (3) MG/3ML SOLN Take 3 mLs by nebulization every 6 (six) hours as needed. (Patient not taking: Reported on 12/31/2023) 360 mL 2   loperamide (IMODIUM) 2 MG capsule Take 1 mg by mouth daily.     meclizine (ANTIVERT) 25 MG tablet Take 25 mg by mouth 2 (two) times daily as needed for dizziness.     Misc Natural Products (ELDERBERRY IMMUNE COMPLEX PO) Take 2 tablets by mouth daily.     Multiple Vitamin (MULTIVITAMIN) tablet Take 1 tablet by mouth daily.     omeprazole (PRILOSEC) 40 MG capsule Take 40 mg by mouth daily.     PARoxetine (PAXIL) 20 MG tablet Take 20 mg by mouth daily.     rosuvastatin (CRESTOR) 5 MG tablet Take 5 mg by mouth daily.     sildenafil (REVATIO) 20 MG tablet Take 20 mg by mouth 3 (three) times daily.     SUMAtriptan (IMITREX) 100 MG tablet Take 100 mg by mouth every 2 (two) hours as needed for migraine.     No current facility-administered medications for this visit.    Allergies  Allergen Reactions   Bee Venom Rash   Shellfish Allergy  Hives   Nintedanib Other (See Comments)    SIRS, fevers   Pseudoephedrine Anxiety    Diagnoses:  Major depressive disorder, recurrent, moderate, irritability Plan of Care: I will meet with the patient every 2 to 3 weeks in person Treatment plan: We will use cognitive behavioral therapy, even though some therapy as well as elements of dialectical behavior therapy to help the patient reduce his anxiety by at least 50% with a target date of  October 30th 2024.  Goals will be to help the patient better manage anxiety symptoms and stress, identify causes for anxiety including changes in his health and ways to lower the anxiety, resolve the conflicts contributing to anxiety and help him manage thoughts and worrisome thinking contributing to his anxiety.  Interventions include providing education about  anxiety to help him understand its causes symptoms and triggers..  We will facilitate problem solution skills as well as coping skills to help him manage anxiety and stress symptoms.  We will use cognitive behavioral therapy to identify and change anxiety provoking thought and behavior patterns as well as dialectical therapy to teach distress tolerance and mindfulness skills. Progress: 35% We will continue with treatment goals as listed  above with a new target date of October 31st 2025                                  Lorrene CHRISTELLA Hasten, [facility]                  Lorrene CHRISTELLA Hasten, [facility]               Lorrene CHRISTELLA Hasten, [facility]               Lorrene CHRISTELLA Hasten, [facility]               Lorrene CHRISTELLA Hasten, [facility]               Lorrene CHRISTELLA Hasten, [facility]               Lorrene CHRISTELLA Hasten, [facility]               Lorrene CHRISTELLA Hasten, [facility]               Lorrene CHRISTELLA Hasten, [facility]               Lorrene CHRISTELLA Hasten, [facility]               Lorrene CHRISTELLA Hasten, [facility]               Lorrene CHRISTELLA Hasten, [facility]               Lorrene CHRISTELLA Hasten, [facility]               Lorrene CHRISTELLA Hasten, [facility]               Lorrene CHRISTELLA Hasten, [facility]               Lorrene CHRISTELLA Hasten, [facility]               Lorrene CHRISTELLA Hasten, [facility]               Lorrene CHRISTELLA Hasten, [facility]            Lorrene CHRISTELLA Hasten, [facility]               Lorrene CHRISTELLA Hasten, [facility]               Lorrene CHRISTELLA Hasten, [facility]               Lorrene CHRISTELLA Hasten, [facility]               Lorrene CHRISTELLA Hasten, [facility]               Lorrene CHRISTELLA Hasten, [facility]               Lorrene CHRISTELLA Hasten, [facility]               Lorrene CHRISTELLA Hasten, [facility]               Lorrene CHRISTELLA Hasten, [facility]               Lorrene CHRISTELLA Hasten, [facility] "

## 2024-02-01 IMAGING — CT CT CHEST W/ CM
2 of 3 series · 15 of 36 positions shown, 18 images · IV contrast (Omnipaque)
Comparison: Chest CT dated 03/22/2021.

CLINICAL DATA: Respiratory distress.

EXAM:
CT CHEST WITH CONTRAST
TECHNIQUE: Multidetector CT imaging of the chest was performed during
intravenous contrast administration.

[Series 3: axial st · axial · 0.96mm/px · z∈[-6,+228]mm · 12 of 139 slices shown, 15 images]
[im 11/139  mediastinal]
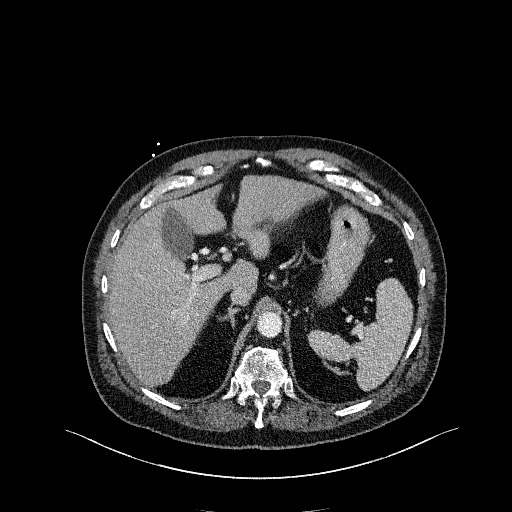
[im 11/139  lung]
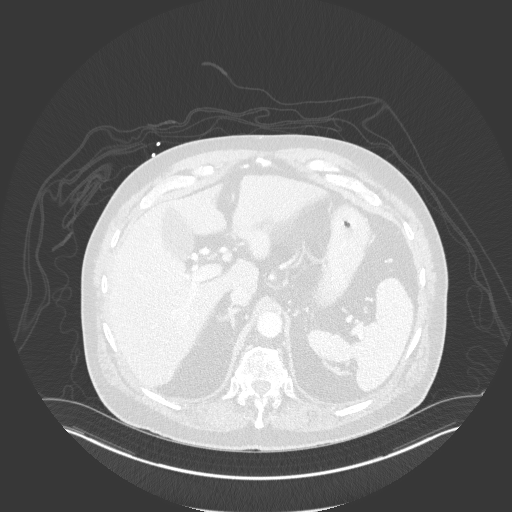
[im 21/139  lung]
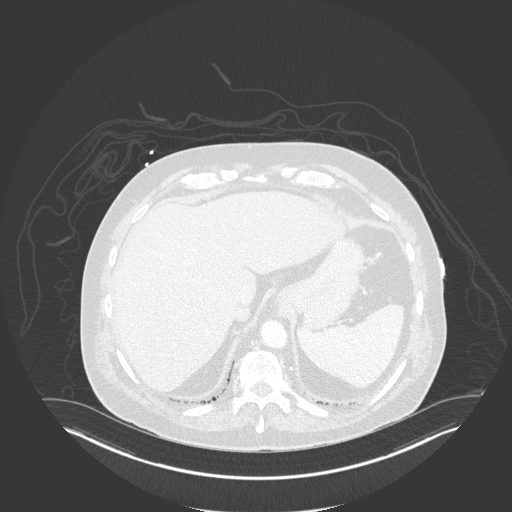
[im 31/139  lung]
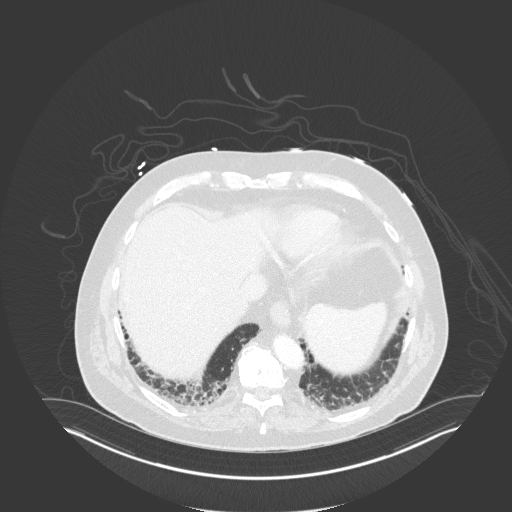
[im 41/139  lung]
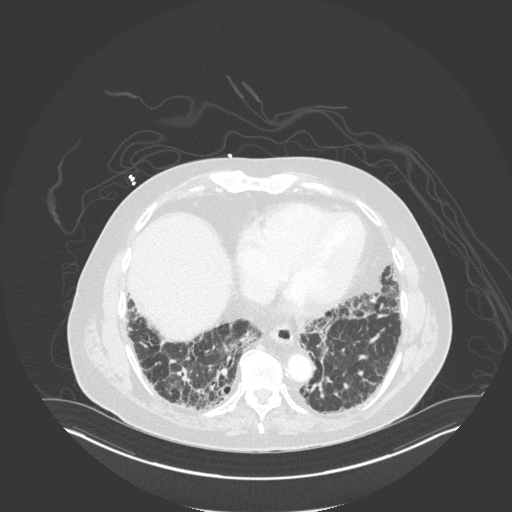
[im 52/139  mediastinal]
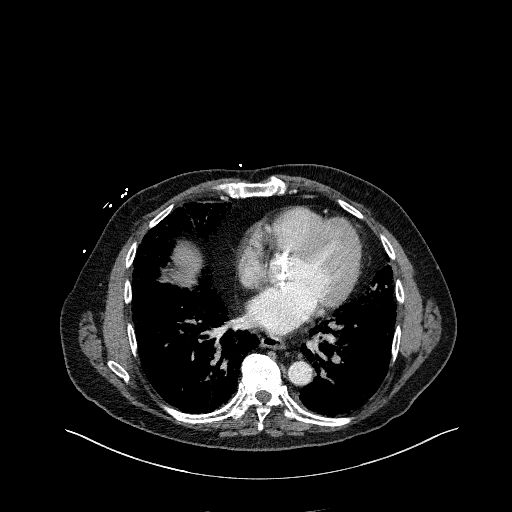
[im 52/139  lung]
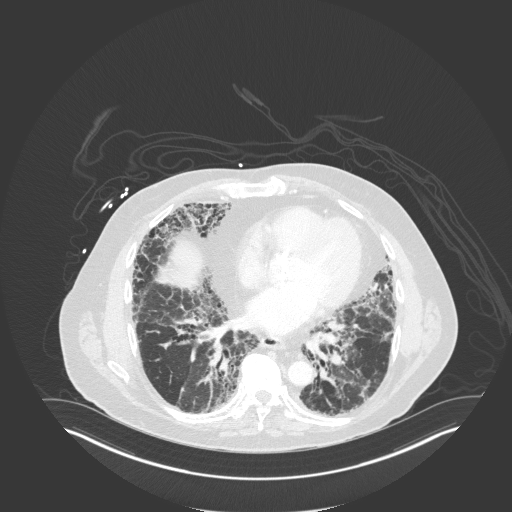
[im 62/139  lung]
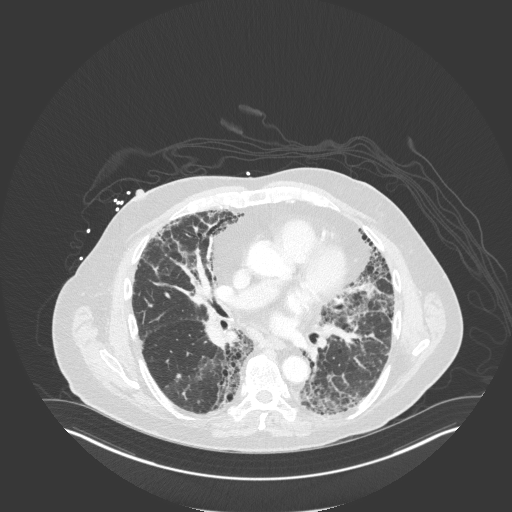
[im 77/139  lung]
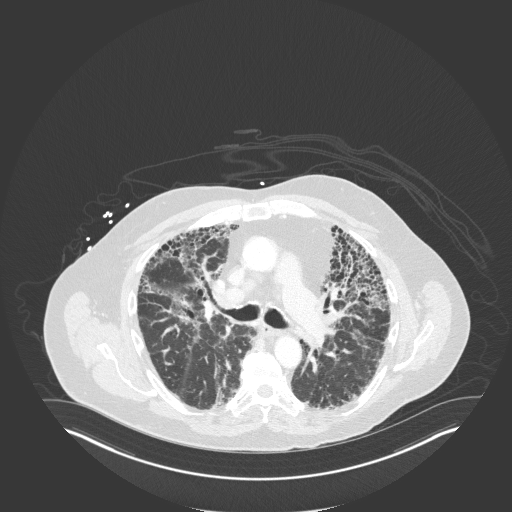
[im 87/139  lung]
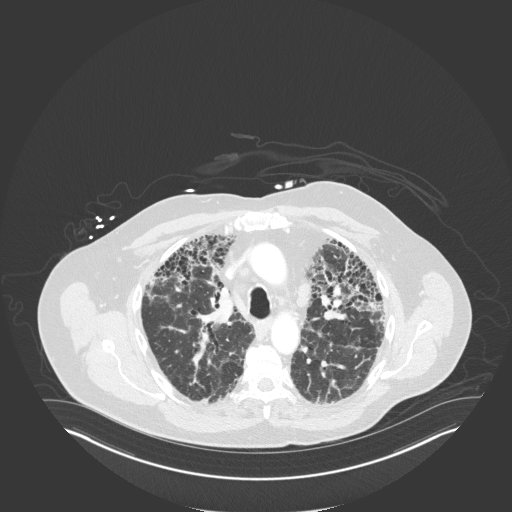
[im 98/139  mediastinal]
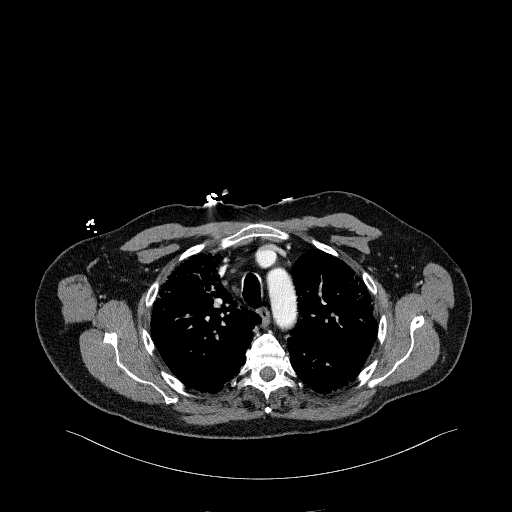
[im 98/139  lung]
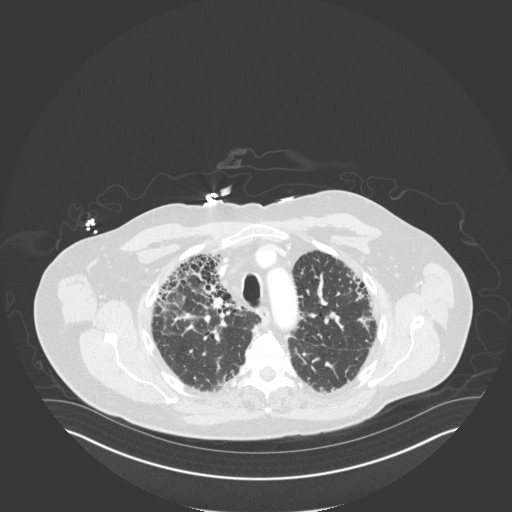
[im 108/139  lung]
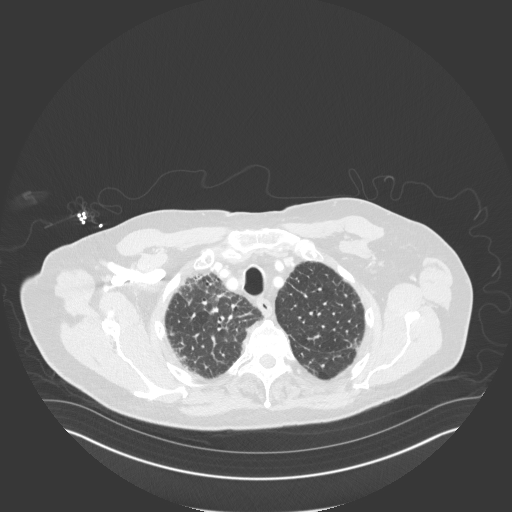
[im 118/139  lung]
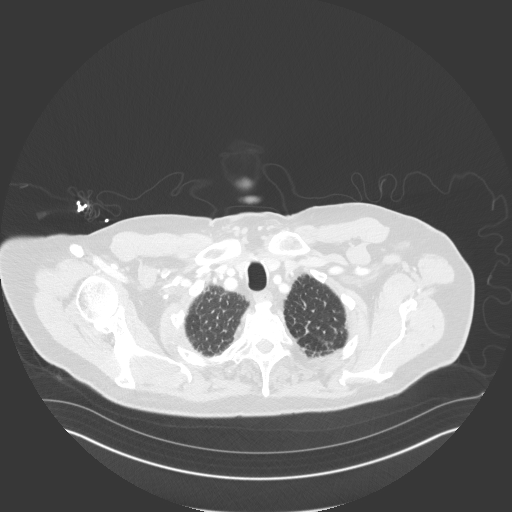
[im 128/139  lung]
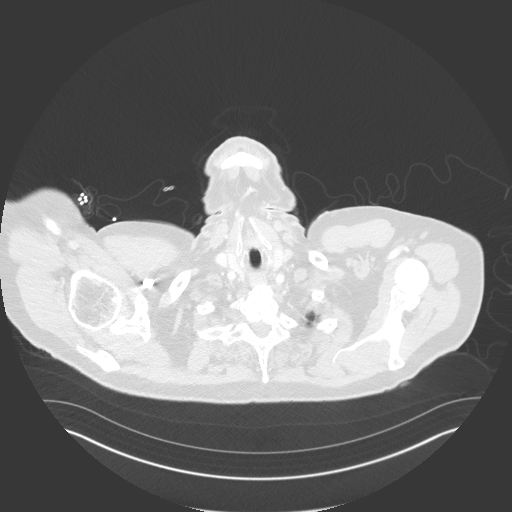

[Series 6: coronal · coronal · 0.54mm/px · 3 of 135 slices shown]
[im 27/135  lung]
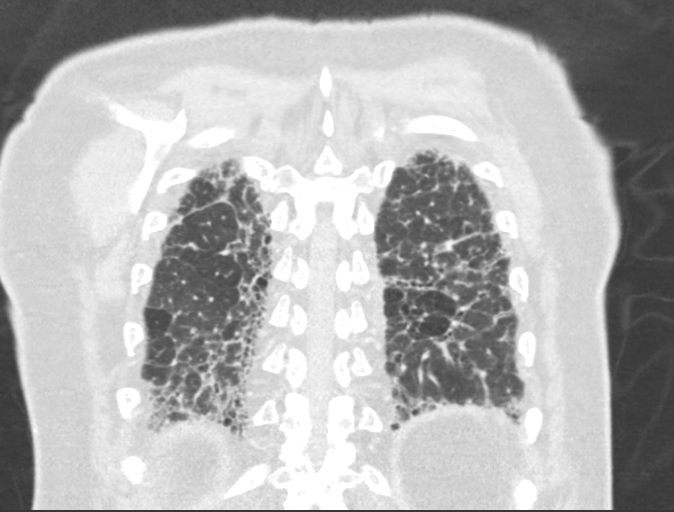
[im 54/135  lung]
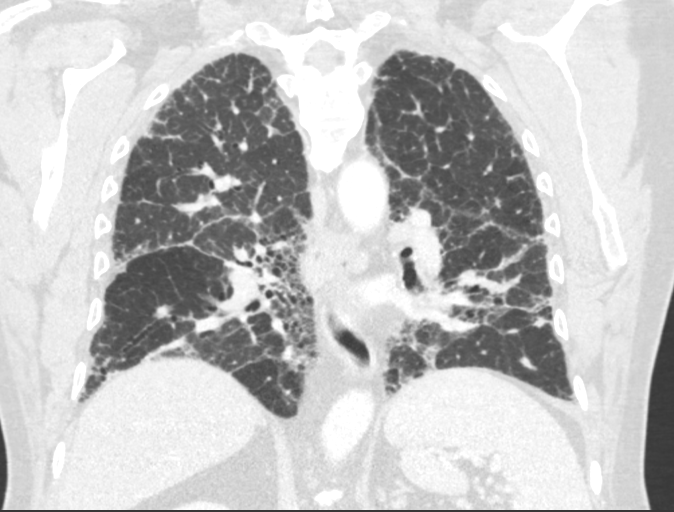
[im 81/135  lung]
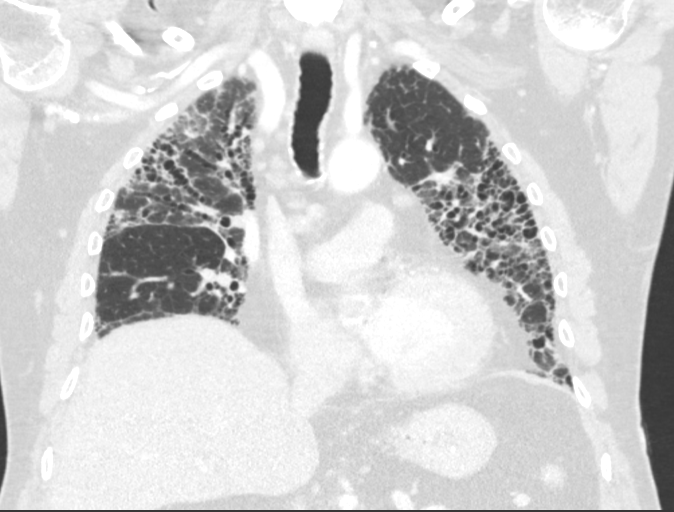

[15 of 36 positions shown; findings below may reference images not displayed]

RADIATION DOSE REDUCTION: This exam was performed according to the
departmental dose-optimization program which includes automated
exposure control, adjustment of the mA and/or kV according to
patient size and/or use of iterative reconstruction technique.

CONTRAST:  75mL OMNIPAQUE IOHEXOL 300 MG/ML  SOLN
FINDINGS: Cardiovascular: There is no cardiomegaly or pericardial effusion.
Calcification of the aortic annulus and leaflets. The thoracic aorta
is unremarkable. The origins of the great vessels of the aortic arch
appear patent. The central pulmonary arteries are grossly
unremarkable for the degree of opacification.

Mediastinum/Nodes: Top-normal mediastinal lymph nodes measuring up
to 13 mm in the prevascular space similar to prior CT, likely
reactive. The esophagus is grossly unremarkable. No mediastinal
fluid collection.

Lungs/Pleura: Similar appearance of diffuse fibrosis with
reticulation and honeycombing consistent with interstitial lung
disease. No new consolidation. No pleural effusion or pneumothorax.
The central airways are patent.

Upper Abdomen: No acute abnormality.

Musculoskeletal: Osteopenia with degenerative changes of the spine.
No acute osseous pathology.
IMPRESSION: 1. No acute intrathoracic pathology.
2. Interstitial lung disease.
3. Aortic Atherosclerosis ([id number]).

## 2024-02-01 IMAGING — DX DG CHEST 1V PORT
1 series · 1 of 1 positions shown · non-contrast
Comparison: 04/26/2021

CLINICAL DATA: Shortness of breath

EXAM:
PORTABLE CHEST 1 VIEW

[chest ap]
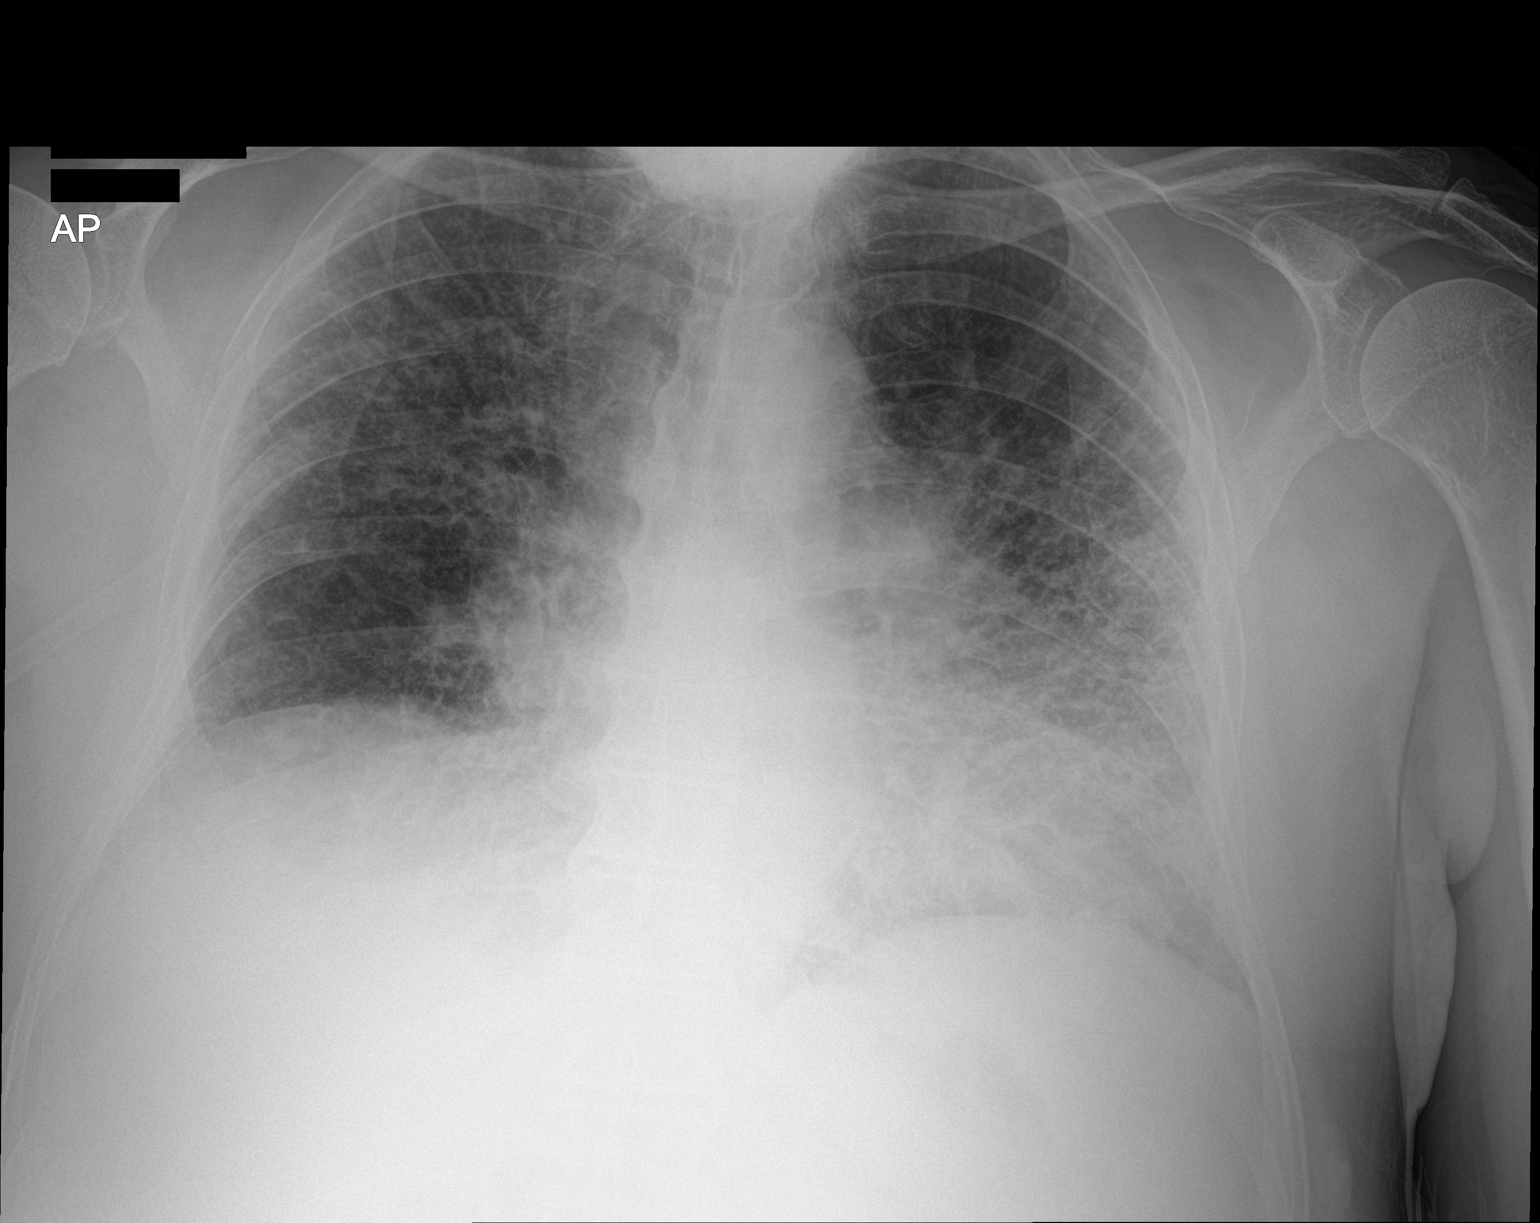

[1 of 1 positions shown; findings below may reference images not displayed]

FINDINGS: Unchanged distribution of reticular opacities greatest at left lung
base. Mild cardiomegaly. No acute consolidation.
IMPRESSION: Chronic interstitial lung disease. No acute cardiopulmonary disease.

## 2024-02-06 ENCOUNTER — Encounter: Payer: Self-pay | Admitting: Behavioral Health

## 2024-02-10 ENCOUNTER — Encounter: Payer: Self-pay | Admitting: Behavioral Health

## 2024-02-10 ENCOUNTER — Ambulatory Visit: Admitting: Behavioral Health

## 2024-02-10 DIAGNOSIS — R454 Irritability and anger: Secondary | ICD-10-CM

## 2024-02-10 DIAGNOSIS — F331 Major depressive disorder, recurrent, moderate: Secondary | ICD-10-CM | POA: Diagnosis not present

## 2024-02-10 DIAGNOSIS — F411 Generalized anxiety disorder: Secondary | ICD-10-CM

## 2024-02-10 NOTE — Progress Notes (Signed)
 Chief Technology Officer Health Counselor Initial Adult Exam  Name: Dustin Dean Date: 02/10/2024 MRN: [id number] DOB: 08-02-1950 PCP: Willian Righter, MD  Time spent: 3 PM until 3:44 PM, 44 minutes spent via a care agility telehealth video visit.  Patient consented to the visit and is aware of its limitations.  The patient was at his home and this therapist was in his outpatient home office. Most part things have gone fairly well for the patient over the past couple of weeks.  Getting some painting done around the home so he and his wife have had to move a lot of things around but he said it felt good to get rid of some things.  He says there are things that he still wants to do but does struggle at times with motivation.  He does do what he needs to do but said there are things that he could do but is always struggled with procrastination and now his health limits how long he can do things so he struggles with what to do so we talked about how we could prioritize in a way that would feel good to him but also learning to give himself grace for those things low on the priority list that he does not seem to get to. He does contract for safety having no thoughts of hurting himself or anyone else.   Guardian/Payee: Self  Paperwork requested: Yes   Reason for Visit /Presenting Problem: Depression  . He does contract for safety having no thoughts of hurting himself or anyone else. Mental Status Exam: Appearance:   Fairly Groomed     Behavior:  Appropriate  Motor:  Normal  Speech/Language:   Clear and Coherent and Normal Rate  Affect:  Appropriate  Mood:  normal  Thought process:  normal  Thought content:    WNL  Sensory/Perceptual disturbances:    WNL  Orientation:  oriented to person, place, time/date, situation, and day of week  Attention:  Good  Concentration:  Good  Memory:  WNL  Fund of knowledge:   Good  Insight:    Good  Judgment:   Good  Impulse Control:  Good     Reported  Symptoms: Depression  Risk Assessment: Danger to Self:  No Self-injurious Behavior: No Danger to Others: No Duty to Warn:no Physical Aggression / Violence:No  Access to Firearms a concern: No  Gang Involvement:No  Patient / guardian was educated about steps to take if suicide or homicide risk level increases between visits: an/a While future psychiatric events cannot be accurately predicted, the patient does not currently require acute inpatient psychiatric care and does not currently meet [place]  involuntary commitment criteria.  Substance Abuse History: Current substance abuse: No     Past Psychiatric History:   Previous psychological history is significant for depression Outpatient Providers: Primary care physician History of Psych Hospitalization: None reported Psychological Testing: an/a   Abuse History:  Victim of: No., None reported   Report needed: No. Victim of Neglect:No. Perpetrator of n/a Witness / Exposure to Domestic Violence: None reported  Protective Services Involvement: No  Witness to [employer] Violence:  No   Family History:  Family History  Problem Relation Age of Onset   Cancer Mother    Congestive Heart Failure Father    Prostate cancer Father    Healthy Sister     Living situation: the patient lives with their spouse  Sexual Orientation: Straight  Relationship Status: married  Name of spouse / other: Did not discuss  If a parent, number of children / ages: Adults  Support Systems: spouse  Financial Stress:  No   Income/Employment/Disability: Doctor, General Practice: No   Educational History: Education: high school diploma/GED  Religion/Sprituality/World View: Did not discuss  Any cultural differences that may affect / interfere with treatment:  not applicable   Recreation/Hobbies: Time with family  Stressors: Health problems    Strengths: Supportive Relationships, Hopefulness, Self Advocate, and Able  to Communicate Effectively  Barriers:     Legal History: Pending legal issue / charges: The patient has no significant history of legal issues. History of legal issue / charges: Not applicable  Medical History/Surgical History: reviewed Past Medical History:  Diagnosis Date   Anxiety    Aortic stenosis    Flu    Pneumonia    Prostate cancer (HCC)    Pulmonary fibrosis (HCC)    Skin cancer     Past Surgical History:  Procedure Laterality Date   HERNIA REPAIR     NASAL SEPTUM SURGERY     RIGHT HEART CATH N/A 05/24/2023   Procedure: RIGHT HEART CATH;  Surgeon: Cherrie Toribio SAUNDERS, MD;  [facility];  Service: Cardiovascular;  Laterality: N/A;   SKIN CANCER EXCISION      Medications: Current Outpatient Medications  Medication Sig Dispense Refill   acetaminophen  (TYLENOL ) 500 MG tablet Take 500 mg by mouth every 8 (eight) hours as needed for moderate pain (pain score 4-6).     aspirin EC 81 MG tablet Take 81 mg by mouth daily. Swallow whole.     Calcium Carbonate-Vitamin D (CALCIUM 600-VITAMIN D3 PO) Take 1 tablet by mouth daily.     cetirizine (ZYRTEC) 10 MG tablet Take 10 mg by mouth daily.     ESBRIET  801 MG TABS Take 1 tablet (801 mg total) by mouth 3 (three) times daily with meals. 270 tablet 2   fluorouracil (EFUDEX) 5 % cream Apply 1 Application topically 2 (two) times daily as needed (irritation).     fluticasone (FLONASE) 50 MCG/ACT nasal spray Place 2 sprays into both nostrils daily. (Patient taking differently: Place 2 sprays into both nostrils daily. As needed)     gabapentin (NEURONTIN) 300 MG capsule Take 900 mg by mouth 3 (three) times daily.     ipratropium (ATROVENT) 0.06 % nasal spray Place 2 sprays into both nostrils 3 (three) times daily.     ipratropium-albuterol  (DUONEB) 0.5-2.5 (3) MG/3ML SOLN Take 3 mLs by nebulization every 6 (six) hours as needed. (Patient not taking: Reported on 12/31/2023) 360 mL 2   loperamide (IMODIUM) 2 MG capsule Take  1 mg by mouth daily.     meclizine (ANTIVERT) 25 MG tablet Take 25 mg by mouth 2 (two) times daily as needed for dizziness.     Misc Natural Products (ELDERBERRY IMMUNE COMPLEX PO) Take 2 tablets by mouth daily.     Multiple Vitamin (MULTIVITAMIN) tablet Take 1 tablet by mouth daily.     omeprazole (PRILOSEC) 40 MG capsule Take 40 mg by mouth daily.     PARoxetine (PAXIL) 20 MG tablet Take 20 mg by mouth daily.     rosuvastatin (CRESTOR) 5 MG tablet Take 5 mg by mouth daily.     sildenafil (REVATIO) 20 MG tablet Take 20 mg by mouth 3 (three) times daily.     SUMAtriptan (IMITREX) 100 MG tablet Take 100 mg by mouth every 2 (two) hours as needed for migraine.     No current  facility-administered medications for this visit.    Allergies  Allergen Reactions   Bee Venom Rash   Shellfish Allergy  Hives   Nintedanib Other (See Comments)    SIRS, fevers   Pseudoephedrine Anxiety    Diagnoses:  Major depressive disorder, recurrent, moderate, irritability Plan of Care: I will meet with the patient every 2 to 3 weeks in person Treatment plan: We will use cognitive behavioral therapy, even though some therapy as well as elements of dialectical behavior therapy to help the patient reduce his anxiety by at least 50% with a target date of  October 30th 2024.  Goals will be to help the patient better manage anxiety symptoms and stress, identify causes for anxiety including changes in his health and ways to lower the anxiety, resolve the conflicts contributing to anxiety and help him manage thoughts and worrisome thinking contributing to his anxiety.  Interventions include providing education about anxiety to help him understand its causes symptoms and triggers..  We will facilitate problem solution skills as well as coping skills to help him manage anxiety and stress symptoms.  We will use cognitive behavioral therapy to identify and change anxiety provoking thought and behavior patterns as well as  dialectical therapy to teach distress tolerance and mindfulness skills. Progress: 45% We will continue with treatment goals as listed above with a new target date of May 14, 2024                                  Lorrene CHRISTELLA Hasten, [facility]                  Lorrene CHRISTELLA Hasten, [facility]               Lorrene CHRISTELLA Hasten, [facility]               Lorrene CHRISTELLA Hasten, [facility]               Lorrene CHRISTELLA Hasten, [facility]               Lorrene CHRISTELLA Hasten, [facility]               Lorrene CHRISTELLA Hasten, [facility]               Lorrene CHRISTELLA Hasten, [facility]               Lorrene CHRISTELLA Hasten, [facility]               Lorrene CHRISTELLA Hasten, [facility]               Lorrene CHRISTELLA Hasten, [facility]               Lorrene CHRISTELLA Hasten, [facility]               Lorrene CHRISTELLA Hasten, [facility]               Lorrene CHRISTELLA Hasten, [facility] Counselor Initial Adult Exam  Name: Dustin Dean Date: 02/10/2024 MRN: [id number] DOB: [birth year] PCP: Willian Righter, MD  Time spent:     spent in person with the patient in the outpatient therapist office.  He does contract for safety having no thoughts of hurting himself or anyone else.   Guardian/Payee: Self  Paperwork requested: Yes   Reason for Visit /Presenting Problem: Depression  . He does contract for safety having no thoughts of hurting himself or anyone else. Mental Status Exam:  Appearance:   Fairly Groomed     Behavior:  Appropriate  Motor:  Normal  Speech/Language:   Clear and Coherent and Normal Rate  Affect:  Appropriate  Mood:  normal  Thought process:  normal  Thought content:    WNL  Sensory/Perceptual disturbances:    WNL  Orientation:  oriented to person, place, time/date, situation, and day of week  Attention:  Good  Concentration:  Good  Memory:  WNL  Fund of knowledge:   Good  Insight:    Good  Judgment:   Good   Impulse Control:  Good     Reported Symptoms: Depression  Risk Assessment: Danger to Self:  No Self-injurious Behavior: No Danger to Others: No Duty to Warn:no Physical Aggression / Violence:No  Access to Firearms a concern: No  Gang Involvement:No  Patient / guardian was educated about steps to take if suicide or homicide risk level increases between visits: n/a While future psychiatric events cannot be accurately predicted, the patient does not currently require acute inpatient psychiatric care and does not currently meet [place]  involuntary commitment criteria.  Substance Abuse History: Current substance abuse: No     Past Psychiatric History:   Previous psychological history is significant for depression Outpatient Providers: Primary care physician History of Psych Hospitalization: None reported Psychological Testing: n/a   Abuse History:  Victim of: No., None reported   Report needed: No. Victim of Neglect:No. Perpetrator of n/a Witness / Exposure to Domestic Violence: None reported  Protective Services Involvement: No  Witness to [employer] Violence:  No   Family History:  Family History  Problem Relation Age of Onset   Cancer Mother    Congestive Heart Failure Father    Prostate cancer Father    Healthy Sister     Living situation: the patient lives with their spouse  Sexual Orientation: Straight  Relationship Status: married  Name of spouse / other: Did not discuss If a parent, number of children / ages: Adults  Support Systems: spouse  Financial Stress:  No   Income/Employment/Disability: Doctor, General Practice: No   Educational History: Education: high school diploma/GED  Religion/Sprituality/World View: Did not discuss  Any cultural differences that may affect / interfere with treatment:  not applicable   Recreation/Hobbies: Time with family  Stressors: Health problems    Strengths: Supportive  Relationships, Hopefulness, Self Advocate, and Able to Communicate Effectively  Barriers:     Legal History: Pending legal issue / charges: The patient has no significant history of legal issues. History of legal issue / charges: Not applicable  Medical History/Surgical History: reviewed Past Medical History:  Diagnosis Date   Anxiety    Aortic stenosis    Flu    Pneumonia    Prostate cancer (HCC)    Pulmonary fibrosis (HCC)    Skin cancer     Past Surgical History:  Procedure Laterality Date   HERNIA REPAIR     NASAL SEPTUM SURGERY     RIGHT HEART CATH N/A 05/24/2023   Procedure: RIGHT HEART CATH;  Surgeon: Cherrie Toribio SAUNDERS, MD;  Location: [facility];  Service: Cardiovascular;  Laterality: N/A;   SKIN CANCER EXCISION      Medications: Current Outpatient Medications  Medication Sig Dispense Refill   acetaminophen  (TYLENOL ) 500 MG tablet Take 500 mg by mouth every 8 (eight) hours as needed for moderate pain (pain score 4-6).     aspirin EC 81 MG tablet Take 81 mg by mouth daily.  Swallow whole.     Calcium Carbonate-Vitamin D (CALCIUM 600-VITAMIN D3 PO) Take 1 tablet by mouth daily.     cetirizine (ZYRTEC) 10 MG tablet Take 10 mg by mouth daily.     ESBRIET  801 MG TABS Take 1 tablet (801 mg total) by mouth 3 (three) times daily with meals. 270 tablet 2   fluorouracil (EFUDEX) 5 % cream Apply 1 Application topically 2 (two) times daily as needed (irritation).     fluticasone (FLONASE) 50 MCG/ACT nasal spray Place 2 sprays into both nostrils daily. (Patient taking differently: Place 2 sprays into both nostrils daily. As needed)     gabapentin (NEURONTIN) 300 MG capsule Take 900 mg by mouth 3 (three) times daily.     ipratropium (ATROVENT) 0.06 % nasal spray Place 2 sprays into both nostrils 3 (three) times daily.     ipratropium-albuterol  (DUONEB) 0.5-2.5 (3) MG/3ML SOLN Take 3 mLs by nebulization every 6 (six) hours as needed. (Patient not taking: Reported on  12/31/2023) 360 mL 2   loperamide (IMODIUM) 2 MG capsule Take 1 mg by mouth daily.     meclizine (ANTIVERT) 25 MG tablet Take 25 mg by mouth 2 (two) times daily as needed for dizziness.     Misc Natural Products (ELDERBERRY IMMUNE COMPLEX PO) Take 2 tablets by mouth daily.     Multiple Vitamin (MULTIVITAMIN) tablet Take 1 tablet by mouth daily.     omeprazole (PRILOSEC) 40 MG capsule Take 40 mg by mouth daily.     PARoxetine (PAXIL) 20 MG tablet Take 20 mg by mouth daily.     rosuvastatin (CRESTOR) 5 MG tablet Take 5 mg by mouth daily.     sildenafil (REVATIO) 20 MG tablet Take 20 mg by mouth 3 (three) times daily.     SUMAtriptan (IMITREX) 100 MG tablet Take 100 mg by mouth every 2 (two) hours as needed for migraine.     No current facility-administered medications for this visit.    Allergies  Allergen Reactions   Bee Venom Rash   Shellfish Allergy  Hives   Nintedanib Other (See Comments)    SIRS, fevers   Pseudoephedrine Anxiety    Diagnoses:  Major depressive disorder, recurrent, moderate, irritability Plan of Care: I will meet with the patient every 2 to 3 weeks in person Treatment plan: We will use cognitive behavioral therapy, even though some therapy as well as elements of dialectical behavior therapy to help the patient reduce his anxiety by at least 50% with a target date of  October 30th 2024.  Goals will be to help the patient better manage anxiety symptoms and stress, identify causes for anxiety including changes in his health and ways to lower the anxiety, resolve the conflicts contributing to anxiety and help him manage thoughts and worrisome thinking contributing to his anxiety.  Interventions include providing education about anxiety to help him understand its causes symptoms and triggers..  We will facilitate problem solution skills as well as coping skills to help him manage anxiety and stress symptoms.  We will use cognitive behavioral therapy to identify and change  anxiety provoking thought and behavior patterns as well as dialectical therapy to teach distress tolerance and mindfulness skills. Progress: 35% We will continue with treatment goals as listed above with a new target date of October 31st 2025  Lorrene CHRISTELLA Hasten, [facility]                  Lorrene CHRISTELLA Hasten, [facility]               Lorrene CHRISTELLA Hasten, [facility]               Lorrene CHRISTELLA Hasten, [facility]               Lorrene CHRISTELLA Hasten, [facility]               Lorrene CHRISTELLA Hasten, [facility]               Lorrene CHRISTELLA Hasten, [facility]               Lorrene CHRISTELLA Hasten, [facility]               Lorrene CHRISTELLA Hasten, [facility]               Lorrene CHRISTELLA Hasten, [facility]               Lorrene CHRISTELLA Hasten, [facility]               Lorrene CHRISTELLA Hasten, [facility]               Lorrene CHRISTELLA Hasten, [facility]               Lorrene CHRISTELLA Hasten, [facility]               Lorrene CHRISTELLA Hasten, [facility]               Lorrene CHRISTELLA Hasten, [facility]               Lorrene CHRISTELLA Hasten, [facility]               Lorrene CHRISTELLA Hasten, [facility]            Lorrene CHRISTELLA Hasten, [facility]               Lorrene CHRISTELLA Hasten, [facility]               Lorrene CHRISTELLA Hasten, [facility]               Lorrene CHRISTELLA Hasten, [facility]               Lorrene CHRISTELLA Hasten, [facility]               Lorrene CHRISTELLA Hasten, [facility]               Lorrene CHRISTELLA Hasten, [facility]               Lorrene CHRISTELLA Hasten, [facility]               Lorrene CHRISTELLA Hasten, [facility]               Lorrene CHRISTELLA Hasten, [facility]               Lorrene CHRISTELLA Hasten, [facility] "

## 2024-02-14 ENCOUNTER — Telehealth: Payer: Self-pay

## 2024-02-14 ENCOUNTER — Encounter: Payer: Self-pay | Admitting: Internal Medicine

## 2024-02-14 DIAGNOSIS — Z5181 Encounter for therapeutic drug level monitoring: Secondary | ICD-10-CM

## 2024-02-14 DIAGNOSIS — R5382 Chronic fatigue, unspecified: Secondary | ICD-10-CM

## 2024-02-14 NOTE — Telephone Encounter (Signed)
 Please advise mychart message.

## 2024-02-14 NOTE — Telephone Encounter (Signed)
 STOP ESBRIET  x 2 weeks -> call us  before restart Other caues of fatigue is Gabapentin but fro now stop Esbreit Check cbc, bmet , lft , mag phos next week to be on safe tside (he had mild anemia as of nov 2025)

## 2024-02-17 ENCOUNTER — Telehealth (HOSPITAL_BASED_OUTPATIENT_CLINIC_OR_DEPARTMENT_OTHER): Payer: Self-pay

## 2024-02-17 MED ORDER — PREDNISONE 10 MG PO TABS: ORAL_TABLET | ORAL | 0 refills | Status: AC

## 2024-02-17 NOTE — Telephone Encounter (Signed)
 RX sent to pharmacy

## 2024-02-17 NOTE — Telephone Encounter (Signed)
 1) the tests I wanted to is blood work -> pls ensure he comes in for reval  2) cough/congestion - if this is same as baseline -then observe but if not do Please take prednisone  40 mg x1 day, then 30 mg x1 day, then 20 mg x1 day, then 10 mg x1 day, and then 5 mg x1 day and stop  You need to send prescription

## 2024-02-17 NOTE — Telephone Encounter (Signed)
 Left pt message to return call

## 2024-02-17 NOTE — Telephone Encounter (Signed)
 Spoke with pt went over instructions to verify but Pt is still experiencing some chest congestion and coughing up phlegm but does not know if it has color. He states it is not often when he coughs it up, his chest just still has some in it. He has no fever he would like to know if you feel any cause for concern. Please advise

## 2024-02-17 NOTE — Telephone Encounter (Signed)
 Patient returning a missed call from [place], [place]. I read the prior message to the patient verbatim. Patient agreed to have prednisone  called in to:  [facility] #[id number] [address], [place], [place] Phone: [phone] Fax: [phone]  I advised the patient that lab orders have been placed and that a re-evaluation appointment is needed. I informed him that labs are walk-in and provided lab hours.  Patient stated he would feel more comfortable if Jamie could schedule his labs for Thursday or Friday in the afternoon, if possible. He also requested that follow-up once scheduled,  be sent via MyChart to avoid missed calls, as he does not answer unknown numbers and calls do not always display as [place].

## 2024-02-18 ENCOUNTER — Telehealth: Payer: Self-pay | Admitting: *Deleted

## 2024-02-18 NOTE — Telephone Encounter (Signed)
 Duplicate

## 2024-02-18 NOTE — Telephone Encounter (Signed)
 I called and spoke with the patient, advised him of recommendations per Dr. Geronimo.  He stated that his cough is a little more than what he has normally and that he has more mucus than normal.  The mucus is clear when he can get it up.  At night it causes a rattling in his chest or wheezing.  He denies any fever, chills or body aches.  He verified that his sats are in the 90's.  I verified his pharmacy, as I went to sent the script, Dr. Geronimo had already sent it in on 02/17/24.

## 2024-02-20 ENCOUNTER — Other Ambulatory Visit

## 2024-02-20 DIAGNOSIS — Z5181 Encounter for therapeutic drug level monitoring: Secondary | ICD-10-CM

## 2024-02-20 DIAGNOSIS — R5382 Chronic fatigue, unspecified: Secondary | ICD-10-CM

## 2024-02-20 LAB — CBC WITH DIFFERENTIAL/PLATELET
Basophils Absolute: 0 10*3/uL (ref 0.0–0.1)
Basophils Relative: 0.1 % (ref 0.0–3.0)
Eosinophils Absolute: 0 10*3/uL (ref 0.0–0.7)
Eosinophils Relative: 0.2 % (ref 0.0–5.0)
HCT: 38 % — ABNORMAL LOW (ref 39.0–52.0)
Hemoglobin: 12.3 g/dL — ABNORMAL LOW (ref 13.0–17.0)
Lymphocytes Relative: 7.3 % — ABNORMAL LOW (ref 12.0–46.0)
Lymphs Abs: 0.8 10*3/uL (ref 0.7–4.0)
MCHC: 32.5 g/dL (ref 30.0–36.0)
MCV: 85.8 fl (ref 78.0–100.0)
Monocytes Absolute: 0.4 10*3/uL (ref 0.1–1.0)
Monocytes Relative: 3.2 % (ref 3.0–12.0)
Neutro Abs: 10 10*3/uL — ABNORMAL HIGH (ref 1.4–7.7)
Neutrophils Relative %: 89.2 % — ABNORMAL HIGH (ref 43.0–77.0)
Platelets: 196 10*3/uL (ref 150.0–400.0)
RBC: 4.43 Mil/uL (ref 4.22–5.81)
RDW: 17 % — ABNORMAL HIGH (ref 11.5–15.5)
WBC: 11.2 10*3/uL — ABNORMAL HIGH (ref 4.0–10.5)

## 2024-02-20 LAB — COMPREHENSIVE METABOLIC PANEL WITH GFR
ALT: 14 U/L (ref 3–53)
AST: 21 U/L (ref 5–37)
Albumin: 3.9 g/dL (ref 3.5–5.2)
Alkaline Phosphatase: 89 U/L (ref 39–117)
BUN: 17 mg/dL (ref 6–23)
CO2: 36 meq/L — ABNORMAL HIGH (ref 19–32)
Calcium: 9.5 mg/dL (ref 8.4–10.5)
Chloride: 100 meq/L (ref 96–112)
Creatinine, Ser: 0.79 mg/dL (ref 0.40–1.50)
GFR: 89.26 mL/min
Glucose, Bld: 149 mg/dL — ABNORMAL HIGH (ref 70–99)
Potassium: 4.1 meq/L (ref 3.5–5.1)
Sodium: 140 meq/L (ref 135–145)
Total Bilirubin: 0.2 mg/dL (ref 0.2–1.2)
Total Protein: 7.4 g/dL (ref 6.0–8.3)

## 2024-02-20 LAB — PHOSPHORUS: Phosphorus: 2.7 mg/dL (ref 2.3–4.6)

## 2024-02-20 LAB — MAGNESIUM: Magnesium: 1.9 mg/dL (ref 1.5–2.5)

## 2024-02-26 ENCOUNTER — Ambulatory Visit: Admitting: Primary Care

## 2024-03-02 ENCOUNTER — Ambulatory Visit: Admitting: Behavioral Health

## 2024-03-03 ENCOUNTER — Ambulatory Visit: Admitting: Behavioral Health

## 2024-03-16 ENCOUNTER — Ambulatory Visit: Admitting: Behavioral Health

## 2024-03-23 ENCOUNTER — Ambulatory Visit: Admitting: Behavioral Health

## 2024-03-30 ENCOUNTER — Ambulatory Visit: Admitting: Behavioral Health

## 2024-04-09 ENCOUNTER — Ambulatory Visit: Admitting: Internal Medicine

## 2024-04-09 ENCOUNTER — Encounter
